# Patient Record
Sex: Female | Born: 1960 | Race: White | Hispanic: No | Marital: Married | Smoking: Never smoker
Health system: Southern US, Community
[De-identification: ages and names within clinical notes are randomized; demographics above are authoritative.]

## PROBLEM LIST (undated history)

## (undated) DIAGNOSIS — E079 Disorder of thyroid, unspecified: Secondary | ICD-10-CM

## (undated) DIAGNOSIS — Z9889 Other specified postprocedural states: Secondary | ICD-10-CM

## (undated) DIAGNOSIS — F419 Anxiety disorder, unspecified: Secondary | ICD-10-CM

## (undated) DIAGNOSIS — F32A Depression, unspecified: Secondary | ICD-10-CM

## (undated) DIAGNOSIS — M47812 Spondylosis without myelopathy or radiculopathy, cervical region: Secondary | ICD-10-CM

## (undated) DIAGNOSIS — D649 Anemia, unspecified: Secondary | ICD-10-CM

## (undated) DIAGNOSIS — F40240 Claustrophobia: Secondary | ICD-10-CM

## (undated) DIAGNOSIS — T753XXA Motion sickness, initial encounter: Secondary | ICD-10-CM

## (undated) DIAGNOSIS — M1990 Unspecified osteoarthritis, unspecified site: Secondary | ICD-10-CM

## (undated) DIAGNOSIS — F329 Major depressive disorder, single episode, unspecified: Secondary | ICD-10-CM

## (undated) DIAGNOSIS — E039 Hypothyroidism, unspecified: Secondary | ICD-10-CM

## (undated) DIAGNOSIS — M461 Sacroiliitis, not elsewhere classified: Secondary | ICD-10-CM

## (undated) DIAGNOSIS — M719 Bursopathy, unspecified: Secondary | ICD-10-CM

## (undated) DIAGNOSIS — R112 Nausea with vomiting, unspecified: Secondary | ICD-10-CM

## (undated) DIAGNOSIS — I10 Essential (primary) hypertension: Secondary | ICD-10-CM

## (undated) DIAGNOSIS — R569 Unspecified convulsions: Secondary | ICD-10-CM

## (undated) DIAGNOSIS — Z973 Presence of spectacles and contact lenses: Secondary | ICD-10-CM

## (undated) DIAGNOSIS — E162 Hypoglycemia, unspecified: Secondary | ICD-10-CM

## (undated) DIAGNOSIS — M7060 Trochanteric bursitis, unspecified hip: Secondary | ICD-10-CM

## (undated) DIAGNOSIS — Z87442 Personal history of urinary calculi: Secondary | ICD-10-CM

## (undated) DIAGNOSIS — T819XXA Unspecified complication of procedure, initial encounter: Secondary | ICD-10-CM

## (undated) DIAGNOSIS — C801 Malignant (primary) neoplasm, unspecified: Secondary | ICD-10-CM

## (undated) DIAGNOSIS — N200 Calculus of kidney: Secondary | ICD-10-CM

## (undated) HISTORY — DX: Unspecified complication of procedure, initial encounter: T81.9XXA

## (undated) HISTORY — PX: APPENDECTOMY: SHX54

## (undated) HISTORY — DX: Anxiety disorder, unspecified: F41.9

## (undated) HISTORY — DX: Depression, unspecified: F32.A

## (undated) HISTORY — PX: CYSTOSCOPY: SUR368

## (undated) HISTORY — DX: Essential (primary) hypertension: I10

## (undated) HISTORY — DX: Sacroiliitis, not elsewhere classified: M46.1

## (undated) HISTORY — DX: Spondylosis without myelopathy or radiculopathy, cervical region: M47.812

## (undated) HISTORY — DX: Disorder of thyroid, unspecified: E07.9

## (undated) HISTORY — PX: ABDOMINAL HYSTERECTOMY: SHX81

## (undated) HISTORY — DX: Major depressive disorder, single episode, unspecified: F32.9

## (undated) HISTORY — PX: GALLBLADDER SURGERY: SHX652

## (undated) HISTORY — DX: Trochanteric bursitis, unspecified hip: M70.60

---

## 1983-10-18 DIAGNOSIS — R569 Unspecified convulsions: Secondary | ICD-10-CM

## 1983-10-18 HISTORY — DX: Unspecified convulsions: R56.9

## 1997-10-17 HISTORY — PX: TOTAL VAGINAL HYSTERECTOMY: SHX2548

## 2005-10-17 HISTORY — PX: BACK SURGERY: SHX140

## 2006-10-17 HISTORY — PX: GASTRIC BYPASS: SHX52

## 2007-10-18 HISTORY — PX: GALLBLADDER SURGERY: SHX652

## 2008-10-17 HISTORY — PX: APPENDECTOMY: SHX54

## 2013-01-28 ENCOUNTER — Emergency Department: Payer: Self-pay | Admitting: Emergency Medicine

## 2013-01-28 LAB — URINALYSIS, COMPLETE
Bilirubin,UR: NEGATIVE
Glucose,UR: NEGATIVE mg/dL (ref 0–75)
Leukocyte Esterase: NEGATIVE
Nitrite: NEGATIVE
Ph: 5 (ref 4.5–8.0)
Protein: 100
RBC,UR: 1629 /HPF (ref 0–5)
Specific Gravity: 1.026 (ref 1.003–1.030)
Squamous Epithelial: NONE SEEN
WBC UR: 4 /HPF (ref 0–5)

## 2013-01-28 LAB — CBC
HCT: 38.4 % (ref 35.0–47.0)
HGB: 12.1 g/dL (ref 12.0–16.0)
MCH: 26.4 pg (ref 26.0–34.0)
MCHC: 31.4 g/dL — ABNORMAL LOW (ref 32.0–36.0)
MCV: 84 fL (ref 80–100)
Platelet: 286 10*3/uL (ref 150–440)
RBC: 4.57 10*6/uL (ref 3.80–5.20)
RDW: 16.4 % — ABNORMAL HIGH (ref 11.5–14.5)
WBC: 5.9 10*3/uL (ref 3.6–11.0)

## 2013-01-28 LAB — COMPREHENSIVE METABOLIC PANEL
Albumin: 4 g/dL (ref 3.4–5.0)
Alkaline Phosphatase: 142 U/L — ABNORMAL HIGH (ref 50–136)
Anion Gap: 3 — ABNORMAL LOW (ref 7–16)
BUN: 13 mg/dL (ref 7–18)
Bilirubin,Total: 0.3 mg/dL (ref 0.2–1.0)
Calcium, Total: 8.8 mg/dL (ref 8.5–10.1)
Chloride: 107 mmol/L (ref 98–107)
Co2: 28 mmol/L (ref 21–32)
Creatinine: 0.71 mg/dL (ref 0.60–1.30)
EGFR (African American): >60
EGFR (Non-African Amer.): >60
Glucose: 82 mg/dL (ref 65–99)
Osmolality: 275 (ref 275–301)
Potassium: 3.6 mmol/L (ref 3.5–5.1)
SGOT(AST): 26 U/L (ref 15–37)
SGPT (ALT): 39 U/L (ref 12–78)
Sodium: 138 mmol/L (ref 136–145)
Total Protein: 7.3 g/dL (ref 6.4–8.2)

## 2013-02-13 DIAGNOSIS — N200 Calculus of kidney: Secondary | ICD-10-CM | POA: Insufficient documentation

## 2013-08-15 DIAGNOSIS — R8290 Unspecified abnormal findings in urine: Secondary | ICD-10-CM | POA: Insufficient documentation

## 2013-08-15 DIAGNOSIS — R82992 Hyperoxaluria: Secondary | ICD-10-CM | POA: Insufficient documentation

## 2013-09-08 ENCOUNTER — Ambulatory Visit: Payer: Self-pay | Admitting: Emergency Medicine

## 2014-03-17 DIAGNOSIS — M47812 Spondylosis without myelopathy or radiculopathy, cervical region: Secondary | ICD-10-CM

## 2014-03-17 DIAGNOSIS — M5030 Other cervical disc degeneration, unspecified cervical region: Secondary | ICD-10-CM | POA: Insufficient documentation

## 2014-03-17 HISTORY — DX: Spondylosis without myelopathy or radiculopathy, cervical region: M47.812

## 2014-05-28 DIAGNOSIS — M47816 Spondylosis without myelopathy or radiculopathy, lumbar region: Secondary | ICD-10-CM | POA: Insufficient documentation

## 2014-08-22 DIAGNOSIS — M461 Sacroiliitis, not elsewhere classified: Secondary | ICD-10-CM

## 2014-08-22 HISTORY — DX: Sacroiliitis, not elsewhere classified: M46.1

## 2014-10-01 DIAGNOSIS — M7060 Trochanteric bursitis, unspecified hip: Secondary | ICD-10-CM

## 2014-10-01 HISTORY — DX: Trochanteric bursitis, unspecified hip: M70.60

## 2015-02-20 DIAGNOSIS — T819XXA Unspecified complication of procedure, initial encounter: Secondary | ICD-10-CM

## 2015-02-20 DIAGNOSIS — K2960 Other gastritis without bleeding: Secondary | ICD-10-CM | POA: Insufficient documentation

## 2015-02-20 HISTORY — DX: Unspecified complication of procedure, initial encounter: T81.9XXA

## 2015-03-26 ENCOUNTER — Encounter: Payer: Self-pay | Admitting: Psychiatry

## 2015-03-26 ENCOUNTER — Other Ambulatory Visit: Payer: Self-pay

## 2015-03-26 ENCOUNTER — Ambulatory Visit (INDEPENDENT_AMBULATORY_CARE_PROVIDER_SITE_OTHER): Payer: Federal, State, Local not specified - PPO | Admitting: Psychiatry

## 2015-03-26 VITALS — BP 124/86 | HR 66 | Temp 97.8°F | Ht 69.0 in

## 2015-03-26 DIAGNOSIS — F331 Major depressive disorder, recurrent, moderate: Secondary | ICD-10-CM | POA: Diagnosis not present

## 2015-03-26 DIAGNOSIS — F509 Eating disorder, unspecified: Secondary | ICD-10-CM | POA: Diagnosis not present

## 2015-03-26 MED ORDER — BUPROPION HCL ER (SR) 100 MG PO TB12: 100.0000 mg | ORAL_TABLET | Freq: Every day | ORAL | Status: DC

## 2015-03-26 MED ORDER — CLONAZEPAM 0.5 MG PO TABS: 0.5000 mg | ORAL_TABLET | Freq: Every day | ORAL | Status: DC

## 2015-03-26 MED ORDER — LAMOTRIGINE 25 MG PO TABS: ORAL_TABLET | ORAL | Status: DC

## 2015-03-26 NOTE — Progress Notes (Signed)
BH MD/PA/NP OP Progress Note  03/26/2015 11:19 AM Toni Green  MRN:  [id number]  Subjective:   Pt is a 54 yo female presented for follow up accompanied by her husband. She stated that she has started feeling somewhat better after her medications were adjusted at her last visit. She reported that she continues to feel depressed and anxious and is going to have distilled bypass surgery of her gastric mucosa as she continues to have problems with her bile. She has history of gastric bypass in the past. She reported that she is very much focused on losing 50 pounds as she has gained 50 pounds after her gastric bypass surgery. She reported that she drinks a lot of coffee throughout the day. Her husband reported that she consumes approximately 10 cups of coffee per day. Patient appeared tearful during the interview. She stated that she does not eat at all during the day as she does not feel hungry. Her husband reported that she feels anxious and is focused on losing weight as a well. Patient reported that her depressive symptoms are improving and she has taken Klonopin 2-3 times on certain days but is usually taking the medication as prescribed. She has noticed improvement in her symptoms with the help of the medication changes. She sleeps well with the help of the Ambien. She currently denied having any suicidal ideations or plans. Chief Complaint:  Chief Complaint    Anxiety; Follow-up     Visit Diagnosis:  No diagnosis found.  Past Medical History:  Past Medical History  Diagnosis Date  . Hypertension   . Thyroid disease   . Anxiety   . Depression     Past Surgical History  Procedure Laterality Date  . Abdominal hysterectomy    . Gallbladder surgery    . Appendectomy    . Gastric bypass     Family History:  Family History  Problem Relation Age of Onset  . Depression Mother   . Hypertension Mother   . Depression Father   . Hypertension Father   . Depression Sister   . Hypertension  Maternal Grandfather   . Depression Maternal Grandmother   . Hypertension Maternal Grandmother   . Hypertension Paternal Grandfather   . Hypertension Paternal Grandmother    Social History:  History   Social History  . Marital Status: Married    Spouse Name: N/A  . Number of Children: N/A  . Years of Education: N/A   Social History Main Topics  . Smoking status: Never Smoker   . Smokeless tobacco: Never Used  . Alcohol Use: No  . Drug Use: No  . Sexual Activity: No   Other Topics Concern  . None   Social History Narrative  . None   Additional History:  Husband is very supportive. 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 Assessment:   Musculoskeletal: Strength & Muscle Tone: within normal limits Gait & Station: normal Patient leans: N/A  Psychiatric Specialty Exam: HPI  Review of Systems  Constitutional: Positive for malaise/fatigue. Negative for weight loss.  HENT: Negative for nosebleeds and tinnitus.   Eyes: Negative for pain.  Respiratory: Negative for sputum production and stridor.   Cardiovascular: Negative for orthopnea.  Gastrointestinal: Negative for diarrhea.  Musculoskeletal: Positive for back pain. Negative for neck pain.  Skin: Negative for itching.  Neurological: Negative for speech change.  Endo/Heme/Allergies: Negative for environmental allergies.  Psychiatric/Behavioral: Positive for depression. Negative for substance  abuse. The patient is nervous/anxious. The patient does not have insomnia.     Blood pressure 124/86, pulse 66, temperature 97.8 F (36.6 C), temperature source Tympanic, height 5\' 9"  (1.753 m), SpO2 95 %.There is no weight on file to calculate BMI.  General Appearance: Fairly Groomed  Eye Contact:  Fair  Speech:  Slow  Volume:  Decreased  Mood:  Depressed  Affect:  Depressed  Thought Process:  Coherent  Orientation:  Full (Time, Place, and Person)  Thought Content:  WDL  Suicidal  Thoughts:  No  Homicidal Thoughts:  No  Memory:  Negative Immediate;   Fair  Judgement:  Fair  Insight:  Fair  Psychomotor Activity:  Normal  Concentration:  Fair  Recall:  [employer] of Knowledge: Fair  Language: Fair  Akathisia:  No  Handed:  Right  AIMS (if indicated):  none  Assets:  Communication Skills Desire for Improvement Physical Health  ADL's:  Intact  Cognition: WNL  Sleep:  6-7    Is the patient at risk to self?  No. Has the patient been a risk to self in the past 6 months?  No. Has the patient been a risk to self within the distant past?  No. Is the patient a risk to others?  No. Has the patient been a risk to others in the past 6 months?  No. Has the patient been a risk to others within the distant past?  No.  Current Medications: Current Outpatient Prescriptions  Medication Sig Dispense Refill  . buPROPion (WELLBUTRIN XL) 150 MG 24 hr tablet     . clonazePAM (KLONOPIN) 0.5 MG tablet     . furosemide (LASIX) 20 MG tablet     . HYDROcodone-acetaminophen (NORCO/VICODIN) 5-325 MG per tablet     . lamoTRIgine (LAMICTAL) 25 MG tablet     . oxyCODONE-acetaminophen (ROXICET) 5-325 MG per tablet 1-2 tabs q 4-6 hrs prn pain    . prazosin (MINIPRESS) 5 MG capsule Take 5 mg by mouth.    . SYNTHROID 50 MCG tablet     . tamsulosin (FLOMAX) 0.4 MG CAPS capsule Take 0.4 mg by mouth.    . zolpidem (AMBIEN CR) 12.5 MG CR tablet     . ARIPiprazole (ABILIFY) 10 MG tablet     . cyclobenzaprine (FLEXERIL) 10 MG tablet     . ondansetron (ZOFRAN-ODT) 4 MG disintegrating tablet     . PARoxetine (PAXIL) 30 MG tablet     . Potassium Citrate 15 MEQ (1620 MG) TBCR Take by mouth.    Marland Kitchen VIIBRYD 40 MG TABS      No current facility-administered medications for this visit.    Medical Decision Making:  Established Problem, Stable/Improving (1)  Treatment Plan Summary:Medication management   Discussed with patient and her husband at length about the medications treatment risk  benefits and alternatives I will start her on lamotrigine 50 mg by mouth daily I will also continue her on Klonopin when necessary and she has enough supply of the medication She will continue on Wellbutrin and I will decrease the dose 100 mg in the morning Advised her to decrease her use of caffeine as she is currently drinking approximately 10 cups per day and she agreed with the plan She will follow up in 1 month  Plan:    SSRI/ SNRI/ Antidepressants Discussed with pt about the [facility] Box warnings, increased risk of suicidal thinking when starting the medications.  GI side effects, sexual side effects, increase in manic  or hypomanic symptoms as well as the discontinuation syndromes.Advised about withdrawal symptoms if stopped immediately. Pt demonstrated understanding.      LAMOTRIGINE Black Box warning of Toxic Epidermal Necrolysis and Kathreen Cosier Syndrome.  The incidence is severe if the medication dose is titrated quickly, co administered with Valproate or exceeding the initial recommended dose.  Pt demonstrated understanding and agreed with the plan.     BENZODIAZEPINES: Benzodiazepine medications may be habit forming.  If you feel the medication is not working as well, do not take more than the prescribed dose.  Taking too much of a benzodiazepine medication may cause respiratory depression and death.  Always store medication safely and away from children.  Benzodiazepine medications can cause drowsiness.  Avoid driving, operating machinery, or doing anything dangerous if you are not alert.Do not drink alcohol when taking benzodiazepine medications.  This will increase the sedative effect, and could lead to alcohol toxicity and death.       More than 50% of the time spent in psychoeducation, counseling and coordination of care.    This note was generated in part or whole with voice recognition software. Voice regonition is usually quite accurate but there are transcription  errors that can and very often do occur. I apologize for any typographical errors that were not detected and corrected.   Rainey Pines 03/26/2015, 11:19 AM

## 2015-04-23 ENCOUNTER — Other Ambulatory Visit: Payer: Self-pay

## 2015-04-23 ENCOUNTER — Encounter: Payer: Self-pay | Admitting: Psychiatry

## 2015-04-23 ENCOUNTER — Ambulatory Visit (INDEPENDENT_AMBULATORY_CARE_PROVIDER_SITE_OTHER): Payer: Federal, State, Local not specified - PPO | Admitting: Psychiatry

## 2015-04-23 ENCOUNTER — Ambulatory Visit: Payer: Federal, State, Local not specified - PPO | Admitting: Psychiatry

## 2015-04-23 VITALS — BP 122/88 | HR 82 | Temp 97.7°F | Ht 70.0 in | Wt 187.8 lb

## 2015-04-23 DIAGNOSIS — F331 Major depressive disorder, recurrent, moderate: Secondary | ICD-10-CM | POA: Insufficient documentation

## 2015-04-23 DIAGNOSIS — F603 Borderline personality disorder: Secondary | ICD-10-CM | POA: Insufficient documentation

## 2015-04-23 DIAGNOSIS — Z8679 Personal history of other diseases of the circulatory system: Secondary | ICD-10-CM | POA: Insufficient documentation

## 2015-04-23 MED ORDER — ZOLPIDEM TARTRATE 10 MG PO TABS: 10.0000 mg | ORAL_TABLET | Freq: Every evening | ORAL | Status: DC | PRN

## 2015-04-23 MED ORDER — DESVENLAFAXINE SUCCINATE ER 50 MG PO TB24: 50.0000 mg | ORAL_TABLET | Freq: Every day | ORAL | Status: DC

## 2015-04-23 MED ORDER — LAMOTRIGINE 100 MG PO TABS: 100.0000 mg | ORAL_TABLET | Freq: Every day | ORAL | Status: DC

## 2015-04-23 MED ORDER — CLONAZEPAM 0.5 MG PO TABS: 0.5000 mg | ORAL_TABLET | Freq: Every day | ORAL | Status: DC

## 2015-04-23 NOTE — Progress Notes (Signed)
BH MD/PA/NP OP Progress Note  04/23/2015 3:26 PM Toni Green  MRN:  [id number]  Subjective:    Patient is a 54 year old female who presented for follow-up accompanied by her husband. She reported that she has been compliant with her medications but she has been crying a lot. She has been taking lamotrigine 50 mg at bedtime. She has noticed improvement in her memory. She reported that she has started exercising and is also following with the nutritionist as she is trying to lose weight. Patient stated that she feels that her mood symptoms are getting better. Patient is interested in titrating the dose of lamotrigine at this time. She takes Klonopin on a when necessary basis. Patient stated that Wellbutrin does not help her and she wants her medications to be adjusted. Her husband remains supportive. She denied having any suicidal homicidal ideations or plans.  Chief Complaint:  Chief Complaint    Follow-up; Depression     Visit Diagnosis:     ICD-9-CM ICD-10-CM   1. MDD (major depressive disorder), recurrent episode, moderate 296.32 F33.1     Past Medical History:  Past Medical History  Diagnosis Date  . Hypertension   . Thyroid disease   . Anxiety   . Depression     Past Surgical History  Procedure Laterality Date  . Abdominal hysterectomy    . Gallbladder surgery    . Appendectomy    . Gastric bypass     Family History:  Family History  Problem Relation Age of Onset  . Depression Mother   . Hypertension Mother   . Depression Father   . Hypertension Father   . Depression Sister   . Hypertension Maternal Grandfather   . Depression Maternal Grandmother   . Hypertension Maternal Grandmother   . Hypertension Paternal Grandfather   . Hypertension Paternal Grandmother    Social History:  History   Social History  . Marital Status: Married    Spouse Name: N/A  . Number of Children: N/A  . Years of Education: N/A   Social History Main Topics  . Smoking status:  Never Smoker   . Smokeless tobacco: Never Used  . Alcohol Use: No  . Drug Use: No  . Sexual Activity: No   Other Topics Concern  . Not on file   Social History Narrative  . No narrative on file   Additional History:   Assessment:   Musculoskeletal: Strength & Muscle Tone: within normal limits Gait & Station: normal Patient leans: N/A  Psychiatric Specialty Exam: HPI  ROS  There were no vitals taken for this visit.There is no weight on file to calculate BMI.  General Appearance: Fairly Groomed  Eye Contact:  Fair  Speech:  Normal Rate  Volume:  Normal  Mood:  Anxious and Depressed  Affect:  Congruent and Tearful  Thought Process:  Coherent  Orientation:  Full (Time, Place, and Person)  Thought Content:  WDL  Suicidal Thoughts:  No  Homicidal Thoughts:  No  Memory:  Immediate;   Fair  Judgement:  Fair  Insight:  Fair  Psychomotor Activity:  Normal  Concentration:  Fair  Recall:  [employer] of Knowledge: Fair  Language: Fair  Akathisia:  No  Handed:  Right  AIMS (if indicated):  Assets:  Communication Skills Desire for Improvement Physical Health Social Support  ADL's:  Intact  Cognition: WNL  Sleep:     Is the patient at risk to self?  No. Has the patient been a risk  to self in the past 6 months?  No. Has the patient been a risk to self within the distant past?  No. Is the patient a risk to others?  No. Has the patient been a risk to others in the past 6 months?  No. Has the patient been a risk to others within the distant past?  No.  Current Medications: Current Outpatient Prescriptions  Medication Sig Dispense Refill  . ARIPiprazole (ABILIFY) 10 MG tablet     . buPROPion (WELLBUTRIN SR) 100 MG 12 hr tablet Take 1 tablet (100 mg total) by mouth daily. 30 tablet 1  . buPROPion (WELLBUTRIN XL) 150 MG 24 hr tablet     . clonazePAM (KLONOPIN) 0.5 MG tablet Take 1 tablet (0.5 mg total) by mouth daily. 30 tablet 1  . cyclobenzaprine (FLEXERIL) 10 MG  tablet     . furosemide (LASIX) 20 MG tablet     . HYDROcodone-acetaminophen (NORCO/VICODIN) 5-325 MG per tablet     . lamoTRIgine (LAMICTAL) 25 MG tablet     . lamoTRIgine (LAMICTAL) 25 MG tablet 2 pills qhs 60 tablet 1  . liothyronine (CYTOMEL) 5 MCG tablet Take by mouth.    . ondansetron (ZOFRAN-ODT) 4 MG disintegrating tablet     . oxyCODONE-acetaminophen (ROXICET) 5-325 MG per tablet 1-2 tabs q 4-6 hrs prn pain    . PARoxetine (PAXIL) 30 MG tablet     . Potassium Citrate 15 MEQ (1620 MG) TBCR Take by mouth.    . prazosin (MINIPRESS) 5 MG capsule Take 5 mg by mouth.    . SYNTHROID 50 MCG tablet     . tamsulosin (FLOMAX) 0.4 MG CAPS capsule Take 0.4 mg by mouth.    Marland Kitchen VIIBRYD 40 MG TABS     . zolpidem (AMBIEN CR) 12.5 MG CR tablet      No current facility-administered medications for this visit.    Medical Decision Making:  Review of Psycho-Social Stressors (1) and Review of Last Therapy Session (1)  Treatment Plan Summary:Medication management  Discussed with patient about the medications and I will adjust them as follows Start her on Pristiq 50 mg by mouth daily She will discontinue the Wellbutrin in the morning I will titrate the lamotrigine 100 mg daily She will continue on Klonopin 0.5 mg when necessary  switch her to Ambien 10 mg at bedtime Discussed with patient about her medications and she demonstrated understanding Follow-up in 1 month    More than 50% of the time spent in psychoeducation, counseling and coordination of care.    This note was generated in part or whole with voice recognition software. Voice regonition is usually quite accurate but there are transcription errors that can and very often do occur. I apologize for any typographical errors that were not detected and corrected.    Rainey Pines 04/23/2015, 3:26 PM

## 2015-05-07 ENCOUNTER — Other Ambulatory Visit: Payer: Self-pay | Admitting: Internal Medicine

## 2015-05-07 ENCOUNTER — Other Ambulatory Visit: Payer: Self-pay

## 2015-05-07 MED ORDER — ZOLPIDEM TARTRATE 10 MG PO TABS: 10.0000 mg | ORAL_TABLET | Freq: Every evening | ORAL | Status: DC | PRN

## 2015-05-12 ENCOUNTER — Other Ambulatory Visit: Payer: Self-pay

## 2015-05-19 ENCOUNTER — Encounter: Payer: Self-pay | Admitting: Psychiatry

## 2015-05-19 ENCOUNTER — Ambulatory Visit (INDEPENDENT_AMBULATORY_CARE_PROVIDER_SITE_OTHER): Payer: Federal, State, Local not specified - PPO | Admitting: Psychiatry

## 2015-05-19 VITALS — BP 122/82 | HR 68 | Temp 97.4°F | Ht 69.0 in | Wt 191.0 lb

## 2015-05-19 DIAGNOSIS — F331 Major depressive disorder, recurrent, moderate: Secondary | ICD-10-CM | POA: Diagnosis not present

## 2015-05-19 MED ORDER — CLONAZEPAM 0.5 MG PO TABS: ORAL_TABLET | ORAL | Status: DC

## 2015-05-19 MED ORDER — ZOLPIDEM TARTRATE ER 12.5 MG PO TBCR: 12.5000 mg | EXTENDED_RELEASE_TABLET | Freq: Every day | ORAL | Status: DC

## 2015-05-19 MED ORDER — LAMOTRIGINE 100 MG PO TABS: 100.0000 mg | ORAL_TABLET | Freq: Every day | ORAL | Status: DC

## 2015-05-19 MED ORDER — DESVENLAFAXINE SUCCINATE ER 100 MG PO TB24: 100.0000 mg | ORAL_TABLET | Freq: Every day | ORAL | Status: DC

## 2015-05-19 NOTE — Progress Notes (Signed)
BH MD/PA/NP OP Progress Note  05/19/2015 1:19 PM Toni Green  MRN:  [id number]  Subjective:    Patient is a 54 year old female who presented for follow-up accompanied by her husband. She stated that she has been feeling sleepy during the daytime after she takes the Klonopin in the morning. Patient reported that she takes Pristiq in the morning to help with her depression and feels that the dose is not enough. She also takes Ambien CR at night to help with her sleep. Patient reported that she wakes up tired. She stated that she has a started improving with her medication and is not crying anymore. She has been following with a nutritionist and is still focused on getting the surgery done but the nutritional feels that she can still lose weight without surgery and can do diet and exercise. Patient is reported that her medications are not working effectively for her her husband also agreed that her more symptoms aren't getting better. She currently denied having any adverse effects of the medications. She denied having any suicidal homicidal ideations or plans    Chief Complaint:  Chief Complaint    Follow-up; Medication Refill; Anxiety; Depression     Visit Diagnosis:     ICD-9-CM ICD-10-CM   1. MDD (major depressive disorder), recurrent episode, moderate 296.32 F33.1     Past Medical History:  Past Medical History  Diagnosis Date  . Hypertension   . Thyroid disease   . Anxiety   . Depression     Past Surgical History  Procedure Laterality Date  . Abdominal hysterectomy    . Gallbladder surgery    . Appendectomy    . Gastric bypass     Family History:  Family History  Problem Relation Age of Onset  . Depression Mother   . Hypertension Mother   . Depression Father   . Hypertension Father   . Depression Sister   . Hypertension Maternal Grandfather   . Depression Maternal Grandmother   . Hypertension Maternal Grandmother   . Hypertension Paternal Grandfather   .  Hypertension Paternal Grandmother    Social History:  History   Social History  . Marital Status: Married    Spouse Name: N/A  . Number of Children: N/A  . Years of Education: N/A   Social History Main Topics  . Smoking status: Never Smoker   . Smokeless tobacco: Never Used  . Alcohol Use: No  . Drug Use: No  . Sexual Activity: No   Other Topics Concern  . None   Social History Narrative   Additional History:  Lives with her husband and has been working full-time in [employer]  Assessment:   Musculoskeletal: Strength & Muscle Tone: within normal limits Gait & Station: normal Patient leans: N/A  Psychiatric Specialty Exam: Anxiety      ROS   Blood pressure 122/82, pulse 68, temperature 97.4 F (36.3 C), temperature source Tympanic, height 5\' 9"  (1.753 m), weight 191 lb (86.637 kg), SpO2 90 %.Body mass index is 28.19 kg/(m^2).  General Appearance: Fairly Groomed  Eye Contact:  Fair  Speech:  Normal Rate  Volume:  Normal  Mood:  Anxious  Affect:  Congruent and Full Range  Thought Process:  Coherent  Orientation:  Full (Time, Place, and Person)  Thought Content:  WDL  Suicidal Thoughts:  No  Homicidal Thoughts:  No  Memory:  Immediate;   Fair  Judgement:  Fair  Insight:  Fair  Psychomotor Activity:  Normal  Concentration:  Fair  Recall:  [employer] of Knowledge: Fair  Language: Fair  Akathisia:  No  Handed:  Right  AIMS (if indicated):  Assets:  Communication Skills Desire for Improvement Physical Health Social Support  ADL's:  Intact  Cognition: WNL  Sleep:     Is the patient at risk to self?  No. Has the patient been a risk to self in the past 6 months?  No. Has the patient been a risk to self within the distant past?  No. Is the patient a risk to others?  No. Has the patient been a risk to others in the past 6 months?  No. Has the patient been a risk to others within the distant past?  No.  Current Medications: Current Outpatient  Prescriptions  Medication Sig Dispense Refill  . clonazePAM (KLONOPIN) 0.5 MG tablet Take 1 tablet (0.5 mg total) by mouth daily. 30 tablet 1  . desvenlafaxine (PRISTIQ) 50 MG 24 hr tablet Take 1 tablet (50 mg total) by mouth daily. 30 tablet 3  . furosemide (LASIX) 20 MG tablet     . HYDROcodone-acetaminophen (NORCO/VICODIN) 5-325 MG per tablet     . lamoTRIgine (LAMICTAL) 100 MG tablet Take 1 tablet (100 mg total) by mouth daily. 30 tablet 1  . oxyCODONE-acetaminophen (ROXICET) 5-325 MG per tablet 1-2 tabs q 4-6 hrs prn pain    . prazosin (MINIPRESS) 5 MG capsule Take 5 mg by mouth.    . SYNTHROID 50 MCG tablet     . tamsulosin (FLOMAX) 0.4 MG CAPS capsule Take 0.4 mg by mouth.    . zolpidem (AMBIEN) 10 MG tablet Take 1 tablet (10 mg total) by mouth at bedtime as needed for sleep. 30 tablet 5  . buPROPion (WELLBUTRIN SR) 100 MG 12 hr tablet Take 1 tablet by mouth daily.    . cyclobenzaprine (FLEXERIL) 10 MG tablet      No current facility-administered medications for this visit.    Medical Decision Making:  Review of Psycho-Social Stressors (1) and Review of Last Therapy Session (1)  Treatment Plan Summary:Medication management  Discussed with patient about the medications and I will adjust them as follows Start her on Pristiq 100 mg amount daily. She will continue on lamotrigine 100 mg daily She will continue on Klonopin 0.25 mg when necessary Patient will continue on Ambien CR 12.5 mg by mouth daily Discussed with patient about her medications and she demonstrated understanding Follow-up in 2 month    More than 50% of the time spent in psychoeducation, counseling and coordination of care.    This note was generated in part or whole with voice recognition software. Voice regonition is usually quite accurate but there are transcription errors that can and very often do occur. I apologize for any typographical errors that were not detected and corrected.    Rainey Pines 05/19/2015,  1:19 PM

## 2015-05-21 ENCOUNTER — Other Ambulatory Visit: Payer: Self-pay

## 2015-05-25 ENCOUNTER — Other Ambulatory Visit: Payer: Self-pay

## 2015-06-11 ENCOUNTER — Other Ambulatory Visit: Payer: Self-pay

## 2015-06-11 MED ORDER — PRAZOSIN HCL 5 MG PO CAPS: 5.0000 mg | ORAL_CAPSULE | Freq: Two times a day (BID) | ORAL | Status: DC

## 2015-06-15 ENCOUNTER — Other Ambulatory Visit: Payer: Self-pay | Admitting: Internal Medicine

## 2015-06-15 MED ORDER — ZOLPIDEM TARTRATE ER 12.5 MG PO TBCR: 12.5000 mg | EXTENDED_RELEASE_TABLET | Freq: Every day | ORAL | Status: DC

## 2015-06-15 NOTE — Progress Notes (Signed)
This has been reorder, not discontinued.  

## 2015-06-17 ENCOUNTER — Other Ambulatory Visit: Payer: Self-pay

## 2015-06-18 ENCOUNTER — Telehealth: Payer: Self-pay

## 2015-06-18 MED ORDER — LAMOTRIGINE 150 MG PO TABS: 150.0000 mg | ORAL_TABLET | Freq: Every day | ORAL | Status: DC

## 2015-06-18 NOTE — Telephone Encounter (Signed)
pt states that she called earlier and spoke with front desk but states that she needs something she can't work like this. pt was cry. pt states that dr. Gretel Acre changed her medication and was doing ok but now she is crying and upset and she just very emotional pt states that she does not feel like she going to harm herself but something needs to be done about her medication.

## 2015-06-18 NOTE — Telephone Encounter (Signed)
pt called spoke with nicole, states she has an appt on oct 4 but she would like it if Dr. Gretel Acre can add a medication to her prestiq, she is crying daily.

## 2015-06-18 NOTE — Telephone Encounter (Signed)
Lamotrigine dose increased to 150mg  po qdaily. Medication refilled.

## 2015-06-19 ENCOUNTER — Emergency Department
Admission: EM | Admit: 2015-06-19 | Discharge: 2015-06-19 | Disposition: A | Discharge status: Home / Self Care | Payer: BLUE CROSS/BLUE SHIELD | Attending: Emergency Medicine | Admitting: Emergency Medicine

## 2015-06-19 ENCOUNTER — Telehealth (HOSPITAL_COMMUNITY): Payer: Self-pay

## 2015-06-19 ENCOUNTER — Encounter: Payer: Self-pay | Admitting: Emergency Medicine

## 2015-06-19 DIAGNOSIS — F419 Anxiety disorder, unspecified: Secondary | ICD-10-CM | POA: Diagnosis present

## 2015-06-19 DIAGNOSIS — F329 Major depressive disorder, single episode, unspecified: Secondary | ICD-10-CM | POA: Insufficient documentation

## 2015-06-19 DIAGNOSIS — Z880 Allergy status to penicillin: Secondary | ICD-10-CM | POA: Insufficient documentation

## 2015-06-19 DIAGNOSIS — I10 Essential (primary) hypertension: Secondary | ICD-10-CM | POA: Insufficient documentation

## 2015-06-19 DIAGNOSIS — F1320 Sedative, hypnotic or anxiolytic dependence, uncomplicated: Secondary | ICD-10-CM | POA: Diagnosis not present

## 2015-06-19 MED ORDER — CLONAZEPAM 0.5 MG PO TABS: ORAL_TABLET | ORAL | Status: DC

## 2015-06-19 MED ORDER — DIAZEPAM 5 MG PO TABS
10.0000 mg | ORAL_TABLET | Freq: Once | ORAL | Status: AC
  Administered 2015-06-19: 10 mg via ORAL
  Filled 2015-06-19: qty 2

## 2015-06-19 MED ORDER — CLONAZEPAM 1 MG PO TABS: 1.0000 mg | ORAL_TABLET | Freq: Three times a day (TID) | ORAL | Status: DC | PRN

## 2015-06-19 NOTE — Discharge Instructions (Signed)
Benzodiazepine Withdrawal  °Benzodiazepines are a group of drugs that are prescribed for both short-term and long-term treatment of a variety of medical conditions. For some of these conditions, such as seizures and sudden and severe muscle spasms, they are used only for a few hours or a few days. For other conditions, such as anxiety, sleep problems, or frequent muscle spasms or to help prevent seizures, they are used for an extended period, usually weeks or months. °Benzodiazepines work by changing the way your brain functions. Normally, chemicals in your brain called neurotransmitters send messages between your brain cells. The neurotransmitter that benzodiazepines affect is called gamma-aminobutyric acid (GABA). GABA sends out messages that have a calming effect on many of the functions of your brain. Benzodiazepines make these messages stronger and increase this calming effect. °Short-term use of benzodiazepines usually does not cause problems when you stop taking the drugs. However, if you take benzodiazepines for a long time, your body can adjust to the drug and require more of it to produce the same effect (drug tolerance). Eventually, you can develop physical dependence on benzodiazepines, which is when you experience negative effects if your dosage of benzodiazepines is reduced or stopped too quickly. These negative effects are called symptoms of withdrawal. °SYMPTOMS °Symptoms of withdrawal may begin anytime within the first 10 days after you stop taking the benzodiazepine. They can last from several weeks up to a few months but usually are the worst between the first 10 to 14 days.  °The actual symptoms also vary, depending on the type of benzodiazepine you take. Possible symptoms include: °· Anxiety. °· Excitability. °· Irritability. °· Depression. °· Mood swings. °· Trouble sleeping. °· Confusion. °· Uncontrollable shaking (tremors). °· Muscle weakness. °· Seizures. °DIAGNOSIS °To diagnose  benzodiazepine withdrawal, your caregiver will examine you for certain signs, such as: °· Rapid heartbeat. °· Rapid breathing. °· Tremors. °· High blood pressure. °· Fever. °· Mood changes. °Your caregiver also may ask the following questions about your use of benzodiazepines: °· What type of benzodiazepine did you take? °· How much did you take each day? °· How long did you take the drug? °· When was the last time you took the drug? °· Do you take any other drugs? °· Have you had alcohol recently? °· Have you had a seizure recently? °· Have you lost consciousness recently? °· Have you had trouble remembering recent events? °· Have you had a recent increase in anxiety, irritability, or trouble sleeping? °A drug test also may be administered. °TREATMENT °The treatment for benzodiazepine withdrawal can vary, depending on the type and severity of your symptoms, what type of benzodiazepine you have been taking, and how long you have been taking the benzodiazepine. Sometimes it is necessary for you to be treated in a hospital, especially if you are at risk of seizures.  °Often, treatment includes a prescription for a long-acting benzodiazepine, the dosage of which is reduced slowly over a long period. This period could be several weeks or months. Eventually, your dosage will be reduced to a point that you can stop taking the drug, without experiencing withdrawal symptoms. This is called tapered withdrawal. Occasionally, minor symptoms of withdrawal continue for a few days or weeks after you have completed a tapered withdrawal. °SEEK IMMEDIATE MEDICAL CARE IF: °· You have a seizure. °· You develop a craving for drugs or alcohol. °· You begin to experience symptoms of withdrawal during your tapered withdrawal. °· You become very confused. °· You lose consciousness. °· You   have trouble breathing. °· You think about hurting yourself or someone else. °Document Released: 09/22/2011 Document Revised: 12/26/2011 Document  Reviewed: 09/22/2011 °[employer]® Patient Information ©2015 [employer]. This information is not intended to replace advice given to you by your health care provider. Make sure you discuss any questions you have with your health care provider. ° °

## 2015-06-19 NOTE — ED Notes (Signed)
Patient to ED with report of accidentally taking and excessive amount of Klonopin over the last 6 weeks. She was written to take 0.5mg  4 times daily but actually took them probably 6-7 times daily. Patient is very tearful and reports that she did not do this intentionally and was probably due to stress in her life. Patient further reports that she wants to quit taking them but is worried about going through withdrawels.

## 2015-06-19 NOTE — ED Provider Notes (Signed)
[facility] Emergency Department Provider Note     Time seen: ----------------------------------------- 3:03 PM on 06/19/2015 -----------------------------------------    I have reviewed the triage vital signs and the nursing notes.   HISTORY  Chief Complaint Anxiety    HPI Toni Green is a 54 y.o. female who presents to ER after she is taking excess amount of Klonopin over the last 6 weeks. She was written to take 0.5 mg 4 times a day but actually took them 6-7 times a day. She is very tearful and states she did not do this intentionally. States was probably due to stress in her life.Patient denies suicidal or homicidal ideations, states she had tried to wean herself when she found she was running out. Patient states she does not want to be dependent on Klonopin for the rest of her life.   Past Medical History  Diagnosis Date  . Hypertension   . Thyroid disease   . Anxiety   . Depression     Patient Active Problem List   Diagnosis Date Noted  . Borderline personality disorder 04/23/2015  . H/O: HTN (hypertension) 04/23/2015  . Depression, major, recurrent, moderate 04/23/2015  . 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 Complication of surgery [id number]  . Bursitis, trochanteric 10/01/2014  . Inflammation of sacroiliac joint 08/22/2014  . 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 DDD (degenerative disc disease), cervical 03/17/2014  . 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 Hyperoxaluria 08/15/2013  . Nonspecific finding on examination of urine 08/15/2013  . Calculus of kidney 02/13/2013    Past Surgical History  Procedure Laterality Date  . Abdominal hysterectomy    . Gallbladder surgery    . Appendectomy    . Gastric bypass      Allergies Losartan and Penicillins  Social History Social History  Substance Use Topics  . Smoking status: Never Smoker   . Smokeless tobacco: Never Used  . Alcohol Use: No     Review of Systems Constitutional: Negative for fever. Eyes: Negative for visual changes. ENT: Negative for sore throat. Cardiovascular: Negative for chest pain. Respiratory: Negative for shortness of breath. Gastrointestinal: Negative for abdominal pain, vomiting and diarrhea. Genitourinary: Negative for dysuria. Musculoskeletal: Negative for back pain. Skin: Negative for rash. Neurological: Negative for headaches, focal weakness or numbness. Psychiatric: Positive for anxiety and depression  10-point ROS otherwise negative.  ____________________________________________   PHYSICAL EXAM:  VITAL SIGNS: ED Triage Vitals  Enc Vitals Group     BP 06/19/15 1243 185/113 mmHg     Pulse Rate 06/19/15 1243 72     Resp 06/19/15 1243 18     Temp 06/19/15 1243 98.4 F (36.9 C)     Temp Source 06/19/15 1243 Oral     SpO2 06/19/15 1243 98 %     Weight 06/19/15 1243 190 lb (86.183 kg)     Height 06/19/15 1243 5\' 10"  (1.778 m)     Head Cir --      Peak Flow --      Pain Score --      Pain Loc --      Pain Edu? --      Excl. in [place]? --     Constitutional: Alert and oriented. Well appearing and in no distress. Eyes: Conjunctivae are normal. PERRL. Normal extraocular movements. ENT   Head: Normocephalic and atraumatic.   Nose: No congestion/rhinnorhea.   Mouth/Throat: Mucous membranes are moist.   Neck: No stridor. Cardiovascular: Normal rate, regular rhythm.  Normal and symmetric distal pulses are present in all extremities. No murmurs, rubs, or gallops. Respiratory: Normal respiratory effort without tachypnea nor retractions. Breath sounds are clear and equal bilaterally. No wheezes/rales/rhonchi. Gastrointestinal: Soft and nontender. No distention. No abdominal bruits.  Musculoskeletal: Nontender with normal range of motion in all extremities. No joint effusions.  No lower extremity tenderness nor edema. Neurologic:  Normal speech and language. No gross focal  neurologic deficits are appreciated. Speech is normal. No gait instability. Skin:  Skin is warm, dry and intact. No rash noted. Psychiatric: Depressed mood and affect. Speech and behavior are normal. Patient exhibits appropriate insight and judgment.  ____________________________________________  ED COURSE:  Pertinent labs & imaging results that were available during my care of the patient were reviewed by me and considered in my medical decision making (see chart for details). Patient is dependent on benzodiazepines. She appears anxious right now. Patient was given oral Valium and will start back on Klonopin. ____________________________________________  FINAL ASSESSMENT AND PLAN  Benzodiazepine dependence  Plan: Patient with labs and imaging as dictated above. Patient is in no acute distress, has been taking extra Klonopin because she is having increased anxiety. I will place her back on benzodiazepine at a higher dose until she can see her psychiatrist on Thursday. Family agrees with plan.   Earleen Newport, MD   Earleen Newport, MD 06/19/15 [phone]

## 2015-06-23 NOTE — Telephone Encounter (Signed)
Called her home number and her husband reported that she is at work. I will d/c Klonopin and will not prescribe any more.

## 2015-06-25 ENCOUNTER — Encounter: Payer: Self-pay | Admitting: Psychiatry

## 2015-06-25 ENCOUNTER — Ambulatory Visit (INDEPENDENT_AMBULATORY_CARE_PROVIDER_SITE_OTHER): Payer: BLUE CROSS/BLUE SHIELD | Admitting: Psychiatry

## 2015-06-25 VITALS — BP 122/78 | HR 116 | Temp 97.5°F | Ht 70.0 in | Wt 179.4 lb

## 2015-06-25 DIAGNOSIS — F331 Major depressive disorder, recurrent, moderate: Secondary | ICD-10-CM | POA: Diagnosis not present

## 2015-06-25 DIAGNOSIS — F411 Generalized anxiety disorder: Secondary | ICD-10-CM | POA: Diagnosis not present

## 2015-06-25 MED ORDER — DESVENLAFAXINE SUCCINATE ER 100 MG PO TB24: 100.0000 mg | ORAL_TABLET | Freq: Every day | ORAL | Status: DC

## 2015-06-25 MED ORDER — CLONAZEPAM 1 MG PO TABS: ORAL_TABLET | ORAL | Status: DC

## 2015-06-25 NOTE — Progress Notes (Signed)
BH MD/PA/NP OP Progress Note  06/25/2015 9:51 AM Toni Green  MRN:  [id number]  Subjective:    Patient is a 54 year old female who presented for follow-up accompanied by her husband. Patient was in the emergency room on 06/19/2015. She presented there stating that she had been taking an excess amount of Klonopin over the past 6 weeks. Her that documentation she was taking 0.5 mg 4 times a day but was actually using that 6-7 times a day. She presents today wanting to come off of the Klonopin and her Ambien. She was given a prescription for Klonopin by the emergency room physician. That prescription was written for 1 mg 3 times a day. The patient has been reducing this by about 1 mg every 2 days. This Probation officer did not give any additional prescriptions however did recommend that she continue to decrease her dose but perhaps go down by half a milligram each week. She will follow-up with Dr. Gretel Acre on 07/21/2015 and was already aware of that appointment. She also wants to come off of her Ambien. She has been trying to use melatonin and Aleve PM. However I did indicate that the NSAIDs contained in Aleve might not be good for regular use she gets instructions from her primary care. I made her aware that the active ingredient for sleep and Aleve's Benadryl and she could use that over-the-counter.  She indicated she was running low on her Pristiq and thus I provided a prescription for that. I did not give her any additional perceptions for Klonopin as she has 1 from the emergency room and is aware she can cut those tablets in half. She also states she has a prescription from Dr. Leda Gauze that she has not filled yet.    Chief Complaint:  Chief Complaint    Follow-up     Visit Diagnosis:     ICD-9-CM ICD-10-CM   1. MDD (major depressive disorder), recurrent episode, moderate 296.32 F33.1   2. Anxiety state 300.00 F41.1     Past Medical History:  Past Medical History  Diagnosis Date  . Hypertension   .  Thyroid disease   . Anxiety   . Depression     Past Surgical History  Procedure Laterality Date  . Abdominal hysterectomy    . Gallbladder surgery    . Appendectomy    . Gastric bypass     Family History:  Family History  Problem Relation Age of Onset  . Depression Mother   . Hypertension Mother   . Depression Father   . Hypertension Father   . Depression Sister   . Hypertension Maternal Grandfather   . Depression Maternal Grandmother   . Hypertension Maternal Grandmother   . Hypertension Paternal Grandfather   . Hypertension Paternal Grandmother    Social History:  Social History   Social History  . Marital Status: Married    Spouse Name: N/A  . Number of Children: N/A  . Years of Education: N/A   Social History Main Topics  . Smoking status: Never Smoker   . Smokeless tobacco: Never Used  . Alcohol Use: No  . Drug Use: No  . Sexual Activity: No   Other Topics Concern  . None   Social History Narrative   Additional History:  Lives with her husband and has been working full-time in [employer]  Assessment:   Musculoskeletal: Strength & Muscle Tone: within normal limits Gait & Station: normal Patient leans: N/A  Psychiatric Specialty Exam: Anxiety Symptoms include insomnia and nervous/anxious  behavior. Patient reports no suicidal ideas.      Review of Systems  Psychiatric/Behavioral: Positive for substance abuse (overuse of Klonopin as discussed above). Negative for depression, suicidal ideas, hallucinations and memory loss. The patient is nervous/anxious and has insomnia.     Blood pressure 122/78, pulse 116, temperature 97.5 F (36.4 C), temperature source Tympanic, height 5\' 10"  (1.778 m), weight 179 lb 6.4 oz (81.375 kg), SpO2 96 %.Body mass index is 25.74 kg/(m^2).  General Appearance: Fairly Groomed  Eye Contact:  Fair  Speech:  Normal Rate  Volume:  Normal  Mood:  Anxious  Affect:  Congruent and Full Range  Thought Process:  Coherent   Orientation:  Full (Time, Place, and Person)  Thought Content:  WDL  Suicidal Thoughts:  No  Homicidal Thoughts:  No  Memory:  Immediate;   Fair  Judgement:  Fair  Insight:  Fair  Psychomotor Activity:  Normal  Concentration:  Fair  Recall:  [employer] of Knowledge: Fair  Language: Fair  Akathisia:  No  Handed:  Right  AIMS (if indicated):  Assets:  Communication Skills Desire for Improvement Physical Health Social Support  ADL's:  Intact  Cognition: WNL  Sleep:     Is the patient at risk to self?  No. Has the patient been a risk to self in the past 6 months?  No. Has the patient been a risk to self within the distant past?  No. Is the patient a risk to others?  No. Has the patient been a risk to others in the past 6 months?  No. Has the patient been a risk to others within the distant past?  No.  Current Medications: Current Outpatient Prescriptions  Medication Sig Dispense Refill  . clonazePAM (KLONOPIN) 1 MG tablet Take 1 tablet twice daily, on 07/07/15 take 1 tablet in the morning and 1/2 tab in afternoon for 7 days, on 07/14/15 take 1/2 tablet in the morning and 1/2 tablet in afternoon for 7 days. See psychiatrist on 10/4 for further 30 tablet 0  . desvenlafaxine (PRISTIQ) 100 MG 24 hr tablet Take 1 tablet (100 mg total) by mouth daily. 30 tablet 0  . furosemide (LASIX) 20 MG tablet     . lamoTRIgine (LAMICTAL) 150 MG tablet Take 1 tablet (150 mg total) by mouth daily. 30 tablet 1  . prazosin (MINIPRESS) 5 MG capsule Take 1 capsule (5 mg total) by mouth 2 (two) times daily. 180 capsule 3  . PRISTIQ 50 MG 24 hr tablet     . SYNTHROID 50 MCG tablet     . tamsulosin (FLOMAX) 0.4 MG CAPS capsule Take 0.4 mg by mouth.    . zolpidem (AMBIEN CR) 12.5 MG CR tablet Take 1 tablet (12.5 mg total) by mouth at bedtime. 90 tablet 1  . cyclobenzaprine (FLEXERIL) 10 MG tablet     . HYDROcodone-acetaminophen (NORCO/VICODIN) 5-325 MG per tablet     . oxyCODONE-acetaminophen (ROXICET)  5-325 MG per tablet 1-2 tabs q 4-6 hrs prn pain     No current facility-administered medications for this visit.    Medical Decision Making:  Review of Psycho-Social Stressors (1) and Review of Last Therapy Session (1)  Treatment Plan Summary:Medication management  Discussed with patient about the medications and I will adjust them as follows Start her on Pristiq 100 mg amount daily. Continue on lamotrigine 150 mg daily She is going to continue on 1 mg twice a day until September 13 in which she will go to  1 mg in the morning and a half a milligram in the afternoon. On September 27 she will go to a half a milligram in the morning and half a milligram in the afternoon and then on October 4 she can get further direction from her regular psychiatrist. He discussed any signs of withdrawal symptoms and that she should call or report to the emergency room should this occur.  This Probation officer provided no additional supply of Klonopin.  Patient will continue on Ambien CR 12.5 mg by mouth daily but has been using Alleve PM and melatonin 5 mg i  Hope to stop the Ambien CR Discussed with patient about her medications and she demonstrated understanding Follow-up in 2 month      Druanne Bosques 06/25/2015, 9:51 AM

## 2015-06-25 NOTE — Telephone Encounter (Signed)
pt was suppose to see you on 06-25-15 but you were out (sick) pt seen dr. Jimmye Norman because she took off from work to see you and you were not here.

## 2015-07-02 ENCOUNTER — Telehealth: Payer: Self-pay | Admitting: Psychiatry

## 2015-07-03 NOTE — Telephone Encounter (Signed)
pt wanted to find out if she can take medication at night or if she can switch to effexor.

## 2015-07-07 ENCOUNTER — Ambulatory Visit (INDEPENDENT_AMBULATORY_CARE_PROVIDER_SITE_OTHER): Payer: BLUE CROSS/BLUE SHIELD | Admitting: Psychiatry

## 2015-07-07 ENCOUNTER — Encounter: Payer: Self-pay | Admitting: Psychiatry

## 2015-07-07 VITALS — BP 136/88 | HR 100 | Temp 98.5°F | Ht 69.0 in | Wt 173.6 lb

## 2015-07-07 DIAGNOSIS — F13230 Sedative, hypnotic or anxiolytic dependence with withdrawal, uncomplicated: Secondary | ICD-10-CM

## 2015-07-07 DIAGNOSIS — F331 Major depressive disorder, recurrent, moderate: Secondary | ICD-10-CM | POA: Diagnosis not present

## 2015-07-07 DIAGNOSIS — F13930 Sedative, hypnotic or anxiolytic use, unspecified with withdrawal, uncomplicated: Secondary | ICD-10-CM

## 2015-07-07 MED ORDER — LAMOTRIGINE 150 MG PO TABS: 150.0000 mg | ORAL_TABLET | Freq: Every day | ORAL | Status: DC

## 2015-07-07 MED ORDER — CLONAZEPAM 0.5 MG PO TABS: 0.5000 mg | ORAL_TABLET | Freq: Every day | ORAL | Status: DC

## 2015-07-07 NOTE — Progress Notes (Signed)
BH MD/PA/NP OP Progress Note  07/07/2015 1:45 PM Toni Green  MRN:  [id number]  Subjective:    Patient is a 54 year old female who presented for follow-up accompanied by her husband. Patient was in the emergency room on 06/19/2015. She was seen by Dr. Jimmye Norman for the follow-up appointment after her discharge from the emergency room. Patient reported that since her discharge she stopped taking the Klonopin and has not taking any pills for the past one week. She reported that she was having withdrawal symptoms including nausea and abdominal pain and mild tremors which were noted during the interview. However she is trying to adjust them out and is not sleeping and having anxiety. She reported that she wants to stop taking the Klonopin. She reported that she is also having diarrhea on a regular basis. Her husband was also concerned about the withdrawal symptoms. She reported that she feels that the prazosin is not helping her as she has noticed a full School in her stools last Thursday. She has history of gastric bypass and wants to have her medication adjusted. Patient was tearful during the interview and she reported that she does not feel connected during the psychiatric interview and she also was anxious coming for this appointment. Patient reported that she was told by Dr. Jimmye Norman at the ER that she needs to be tapered out of the Klonopin but she wants to stop taking the medication quickly.  Her husband was supportive and he wants her to taper out of the medication as well. Patient remained anxious and then she later agreed that she will gradually taper herself out of the medication      Chief Complaint:  Chief Complaint    Follow-up; Medication Problem; Panic Attack; Anxiety     Visit Diagnosis:   No diagnosis found.  Past Medical History:  Past Medical History  Diagnosis Date  . Hypertension   . Thyroid disease   . Anxiety   . Depression     Past Surgical History  Procedure  Laterality Date  . Abdominal hysterectomy    . Gallbladder surgery    . Appendectomy    . Gastric bypass     Family History:  Family History  Problem Relation Age of Onset  . Depression Mother   . Hypertension Mother   . Depression Father   . Hypertension Father   . Depression Sister   . Hypertension Maternal Grandfather   . Depression Maternal Grandmother   . Hypertension Maternal Grandmother   . Hypertension Paternal Grandfather   . Hypertension Paternal Grandmother    Social History:  Social History   Social History  . Marital Status: Married    Spouse Name: N/A  . Number of Children: N/A  . Years of Education: N/A   Social History Main Topics  . Smoking status: Never Smoker   . Smokeless tobacco: Never Used  . Alcohol Use: No  . Drug Use: No  . Sexual Activity: No   Other Topics Concern  . None   Social History Narrative   Additional History:  Lives with her husband and has been working full-time in [employer]  Assessment:   Musculoskeletal: Strength & Muscle Tone: within normal limits Gait & Station: normal Patient leans: N/A  Psychiatric Specialty Exam: Anxiety Symptoms include insomnia and nervous/anxious behavior. Patient reports no suicidal ideas.      Review of Systems  Psychiatric/Behavioral: Positive for substance abuse (overuse of Klonopin as discussed above). Negative for depression, suicidal ideas, hallucinations and memory  loss. The patient is nervous/anxious and has insomnia.     Blood pressure 136/88, pulse 100, temperature 98.5 F (36.9 C), temperature source Tympanic, height 5\' 9"  (1.753 m), weight 173 lb 9.6 oz (78.744 kg), SpO2 96 %.Body mass index is 25.62 kg/(m^2).  General Appearance: Fairly Groomed  Eye Contact:  Fair  Speech:  Normal Rate  Volume:  Normal  Mood:  Anxious  Affect:  Congruent and Full Range  Thought Process:  Coherent  Orientation:  Full (Time, Place, and Person)  Thought Content:  WDL  Suicidal Thoughts:  No   Homicidal Thoughts:  No  Memory:  Immediate;   Fair  Judgement:  Fair  Insight:  Fair  Psychomotor Activity:  Normal  Concentration:  Fair  Recall:  [employer] of Knowledge: Fair  Language: Fair  Akathisia:  No  Handed:  Right  AIMS (if indicated):  Assets:  Communication Skills Desire for Improvement Physical Health Social Support  ADL's:  Intact  Cognition: WNL  Sleep:     Is the patient at risk to self?  No. Has the patient been a risk to self in the past 6 months?  No. Has the patient been a risk to self within the distant past?  No. Is the patient a risk to others?  No. Has the patient been a risk to others in the past 6 months?  No. Has the patient been a risk to others within the distant past?  No.  Current Medications: Current Outpatient Prescriptions  Medication Sig Dispense Refill  . cyclobenzaprine (FLEXERIL) 10 MG tablet     . desvenlafaxine (PRISTIQ) 100 MG 24 hr tablet Take 1 tablet (100 mg total) by mouth daily. 30 tablet 0  . furosemide (LASIX) 20 MG tablet     . HYDROcodone-acetaminophen (NORCO/VICODIN) 5-325 MG per tablet     . lamoTRIgine (LAMICTAL) 150 MG tablet Take 1 tablet (150 mg total) by mouth daily. 30 tablet 1  . oxyCODONE-acetaminophen (ROXICET) 5-325 MG per tablet 1-2 tabs q 4-6 hrs prn pain    . prazosin (MINIPRESS) 5 MG capsule Take 1 capsule (5 mg total) by mouth 2 (two) times daily. 180 capsule 3  . PRISTIQ 50 MG 24 hr tablet     . SYNTHROID 50 MCG tablet     . tamsulosin (FLOMAX) 0.4 MG CAPS capsule Take 0.4 mg by mouth.    . zolpidem (AMBIEN CR) 12.5 MG CR tablet Take 1 tablet (12.5 mg total) by mouth at bedtime. 90 tablet 1  . clonazePAM (KLONOPIN) 0.5 MG tablet Take 1 tablet (0.5 mg total) by mouth daily. 7 tablet 0   No current facility-administered medications for this visit.    Medical Decision Making:  Review of Psycho-Social Stressors (1) and Review of Last Therapy Session (1)  Treatment Plan Summary:Medication management    Benzodiazepine withdrawals Advised patient to start taking the Klonopin 0.5 mg at bedtime for the next 14 days and will then decrease to Klonopin 0.25 mg at bedtime for the next 14 days. She was given written instructions about the same and she demonstrated understanding.  Depression She will continue on Pristiq 100 mg daily  Mood symptoms Patient will continue on lamotrigine 150 mg daily  Follow-up Patient will follow-up in 2 weeks as she wants to be seen early   More than 50% of the time spent in psychoeducation, counseling and coordination of care.  time spent with the patient 25 minutes   This note was generated in part or  whole with voice recognition software. Voice regonition is usually quite accurate but there are transcription errors that can and very often do occur. I apologize for any typographical errors that were not detected and corrected.        Rainey Pines 07/07/2015, 1:45 PM

## 2015-07-20 ENCOUNTER — Encounter: Payer: Self-pay | Admitting: Internal Medicine

## 2015-07-20 DIAGNOSIS — F5105 Insomnia due to other mental disorder: Secondary | ICD-10-CM | POA: Insufficient documentation

## 2015-07-20 DIAGNOSIS — I10 Essential (primary) hypertension: Secondary | ICD-10-CM | POA: Insufficient documentation

## 2015-07-20 DIAGNOSIS — F332 Major depressive disorder, recurrent severe without psychotic features: Secondary | ICD-10-CM | POA: Insufficient documentation

## 2015-07-20 DIAGNOSIS — F4001 Agoraphobia with panic disorder: Secondary | ICD-10-CM | POA: Insufficient documentation

## 2015-07-21 ENCOUNTER — Ambulatory Visit (INDEPENDENT_AMBULATORY_CARE_PROVIDER_SITE_OTHER): Payer: BLUE CROSS/BLUE SHIELD | Admitting: Internal Medicine

## 2015-07-21 ENCOUNTER — Ambulatory Visit (INDEPENDENT_AMBULATORY_CARE_PROVIDER_SITE_OTHER): Payer: BLUE CROSS/BLUE SHIELD | Admitting: Psychiatry

## 2015-07-21 ENCOUNTER — Encounter: Payer: Self-pay | Admitting: Psychiatry

## 2015-07-21 ENCOUNTER — Encounter: Payer: Self-pay | Admitting: Internal Medicine

## 2015-07-21 VITALS — BP 130/80 | HR 92 | Temp 99.0°F | Ht 69.0 in | Wt 175.0 lb

## 2015-07-21 VITALS — BP 124/82 | HR 92 | Temp 98.1°F | Ht 69.0 in | Wt 175.2 lb

## 2015-07-21 DIAGNOSIS — J0100 Acute maxillary sinusitis, unspecified: Secondary | ICD-10-CM

## 2015-07-21 DIAGNOSIS — F331 Major depressive disorder, recurrent, moderate: Secondary | ICD-10-CM

## 2015-07-21 MED ORDER — AZITHROMYCIN 250 MG PO TABS: ORAL_TABLET | ORAL | Status: DC

## 2015-07-21 MED ORDER — DESVENLAFAXINE SUCCINATE ER 100 MG PO TB24: 100.0000 mg | ORAL_TABLET | Freq: Every day | ORAL | Status: DC

## 2015-07-21 MED ORDER — LAMOTRIGINE 150 MG PO TABS: 150.0000 mg | ORAL_TABLET | Freq: Every day | ORAL | Status: DC

## 2015-07-21 NOTE — Progress Notes (Signed)
BH MD/PA/NP OP Progress Note  07/21/2015 10:37 AM Toni Green  MRN:  [id number]  Subjective:    Patient is a 54 year old female who presented for follow-up accompanied by her husband. Patient reported that she is doing much better since her last appointment as she has started tapering herself down on the Klonopin. She is currently taking Klonopin 0.25 mg at bedtime. She reported that she is not having any withdrawal symptoms. She was discussing about the dose of the Klonopin on a daily basis. She is now advised to take Klonopin on alternate days so she does not have any withdrawal symptoms. She agreed with the plan. Her husband also reported that she is more calm and alert and is not experiencing any withdrawal symptoms. Patient reported that she takes Ambien to help her sleep at night which is prescribed by her primary care physician. She reported that the combination of Pristiq and lamotrigine is helping with her mood and depressive symptoms. And she denied having any adverse effects of the medication.  Patient appeared very alert and oriented during the interview and she was very receptive to the medication changes.   Chief Complaint:  Chief Complaint    Follow-up; Medication Refill     Visit Diagnosis:     ICD-9-CM ICD-10-CM   1. MDD (major depressive disorder), recurrent episode, moderate ([place]) 296.32 F33.1     Past Medical History:  Past Medical History  Diagnosis Date  . Hypertension   . Thyroid disease   . Anxiety   . Depression     Past Surgical History  Procedure Laterality Date  . Abdominal hysterectomy    . Gallbladder surgery    . Appendectomy    . Gastric bypass  2008   Family History:  Family History  Problem Relation Age of Onset  . Depression Mother   . Hypertension Mother   . Depression Father   . Hypertension Father   . Depression Sister   . Hypertension Maternal Grandfather   . Depression Maternal Grandmother   . Hypertension Maternal Grandmother    . Hypertension Paternal Grandfather   . Hypertension Paternal Grandmother    Social History:  Social History   Social History  . Marital Status: Married    Spouse Name: N/A  . Number of Children: N/A  . Years of Education: N/A   Social History Main Topics  . Smoking status: Never Smoker   . Smokeless tobacco: Never Used  . Alcohol Use: No  . Drug Use: No  . Sexual Activity: No   Other Topics Concern  . None   Social History Narrative   Additional History:  Lives with her husband and has been working full-time in [employer]  Assessment:   Musculoskeletal: Strength & Muscle Tone: within normal limits Gait & Station: normal Patient leans: N/A  Psychiatric Specialty Exam: Anxiety Symptoms include insomnia and nervous/anxious behavior. Patient reports no suicidal ideas.      Review of Systems  Psychiatric/Behavioral: Positive for substance abuse (overuse of Klonopin as discussed above). Negative for depression, suicidal ideas, hallucinations and memory loss. The patient is nervous/anxious and has insomnia.     Blood pressure 124/82, pulse 92, temperature 98.1 F (36.7 C), temperature source Tympanic, height 5\' 9"  (1.753 m), weight 175 lb 3.2 oz (79.47 kg), SpO2 97 %.Body mass index is 25.86 kg/(m^2).  General Appearance: Fairly Groomed  Eye Contact:  Fair  Speech:  Normal Rate  Volume:  Normal  Mood:  Euthymic  Affect:  Congruent and Full  Range  Thought Process:  Coherent  Orientation:  Full (Time, Place, and Person)  Thought Content:  WDL  Suicidal Thoughts:  No  Homicidal Thoughts:  No  Memory:  Immediate;   Fair  Judgement:  Fair  Insight:  Fair  Psychomotor Activity:  Normal  Concentration:  Fair  Recall:  [employer] of Knowledge: Fair  Language: Fair  Akathisia:  No  Handed:  Right  AIMS (if indicated):  Assets:  Communication Skills Desire for Improvement Physical Health Social Support  ADL's:  Intact  Cognition: WNL  Sleep:     Is the patient  at risk to self?  No. Has the patient been a risk to self in the past 6 months?  No. Has the patient been a risk to self within the distant past?  No. Is the patient a risk to others?  No. Has the patient been a risk to others in the past 6 months?  No. Has the patient been a risk to others within the distant past?  No.  Current Medications: Current Outpatient Prescriptions  Medication Sig Dispense Refill  . clonazePAM (KLONOPIN) 0.5 MG tablet Take 1 tablet (0.5 mg total) by mouth daily. 7 tablet 0  . cyclobenzaprine (FLEXERIL) 10 MG tablet     . desvenlafaxine (PRISTIQ) 100 MG 24 hr tablet Take 1 tablet (100 mg total) by mouth daily. 30 tablet 0  . furosemide (LASIX) 20 MG tablet     . lamoTRIgine (LAMICTAL) 150 MG tablet Take 1 tablet (150 mg total) by mouth daily. 30 tablet 1  . oxyCODONE-acetaminophen (ROXICET) 5-325 MG per tablet 1-2 tabs q 4-6 hrs prn pain    . prazosin (MINIPRESS) 5 MG capsule Take 1 capsule (5 mg total) by mouth 2 (two) times daily. 180 capsule 3  . PRISTIQ 50 MG 24 hr tablet     . SYNTHROID 50 MCG tablet     . tamsulosin (FLOMAX) 0.4 MG CAPS capsule Take 0.4 mg by mouth.    . zolpidem (AMBIEN CR) 12.5 MG CR tablet Take 1 tablet (12.5 mg total) by mouth at bedtime. 90 tablet 1  . HYDROcodone-acetaminophen (NORCO/VICODIN) 5-325 MG per tablet      No current facility-administered medications for this visit.    Medical Decision Making:  Review of Psycho-Social Stressors (1) and Review of Last Therapy Session (1)  Treatment Plan Summary:Medication management   Benzodiazepine withdrawals Advised patient to start taking the Klonopin 0.25 mg at bedtime on alternate days and she demonstrated understanding.  Depression She will continue on Pristiq 100 mg daily  Mood symptoms Patient will continue on lamotrigine 150 mg daily  Follow-up Patient will follow-up in one month   More than 50% of the time spent in psychoeducation, counseling and coordination of care.   time spent with the patient 25 minutes   This note was generated in part or whole with voice recognition software. Voice regonition is usually quite accurate but there are transcription errors that can and very often do occur. I apologize for any typographical errors that were not detected and corrected.        Rainey Pines 07/21/2015, 10:37 AM

## 2015-07-21 NOTE — Patient Instructions (Signed)
Take Zyrtec 10 mg once daily for post nasal drip Begin Flonase (fluticasone) nasal spray - 2 sprays in each nostril once a day

## 2015-07-21 NOTE — Progress Notes (Signed)
Date:  07/21/2015   Name:  Toni Green   DOB:  January 01, 1961   MRN:  [id number]   Chief Complaint: URI URI  The current episode started in the past 7 days. The problem has been gradually worsening. The maximum temperature recorded prior to her arrival was 100.4 - 100.9 F. Associated symptoms include congestion, coughing, ear pain, a plugged ear sensation, sinus pain, a sore throat and swollen glands. Pertinent negatives include no abdominal pain, chest pain, diarrhea, dysuria, nausea, rash or wheezing. She has tried decongestant and acetaminophen for the symptoms. The treatment provided mild relief.   Patient is being seen by psychiatry for depression and anxiety disorder. She recently was taking excessive doses of clonazepam. When she realized what had happened she stopped it cold [place] and then went into withdrawals. She was seen in the emergency room, started back on clonazepam and then followed up with psychiatry. She's now on a structured taper of clonazepam. In addition she is completely off of all narcotic pain medications. She does continue taking Ambien for sleep but plans to taper and discontinue that next. Her current medical regimen is Pristiq and Lamictal.  Review of Systems:  Review of Systems  Constitutional: Positive for fever. Negative for chills and fatigue.  HENT: Positive for congestion, ear pain, postnasal drip, sinus pressure and sore throat. Negative for trouble swallowing and voice change.   Respiratory: Positive for cough. Negative for chest tightness and wheezing.   Cardiovascular: Negative for chest pain and palpitations.  Gastrointestinal: Negative for nausea, abdominal pain and diarrhea.  Genitourinary: Negative for dysuria and hematuria.  Skin: Negative for rash.    Patient Active Problem List   Diagnosis Date Noted  . Essential (primary) hypertension 07/20/2015  . 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 disorder 07/20/2015  . Agoraphobia with panic disorder  07/20/2015  . Severe episode of recurrent major depressive disorder, without psychotic features ([place]) 07/20/2015  . Borderline personality disorder 04/23/2015  . H/O: HTN (hypertension) 04/23/2015  . Depression, major, recurrent, moderate ([place]) 04/23/2015  . 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 Complication of surgery 42/35/3614  . Bursitis, trochanteric 10/01/2014  . Inflammation of sacroiliac joint ([place]) 08/22/2014  . 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 DDD (degenerative disc disease), cervical 03/17/2014  . Arthropathy of cervical facet joint ([place]) 03/17/2014  . Hyperoxaluria ([place]) 08/15/2013  . Nonspecific finding on examination of urine 08/15/2013  . Calculus of kidney 02/13/2013    Prior to Admission medications   Medication Sig Start Date End Date Taking? Authorizing Provider  clonazePAM (KLONOPIN) 0.5 MG tablet Take 1 tablet (0.5 mg total) by mouth daily. 07/07/15  Yes Rainey Pines, MD  desvenlafaxine (PRISTIQ) 100 MG 24 hr tablet Take 1 tablet (100 mg total) by mouth daily. 07/21/15  Yes Rainey Pines, MD  furosemide (LASIX) 20 MG tablet  01/26/15  Yes Historical Provider, MD  lamoTRIgine (LAMICTAL) 150 MG tablet Take 1 tablet (150 mg total) by mouth daily. 07/21/15  Yes Rainey Pines, MD  prazosin (MINIPRESS) 5 MG capsule Take 1 capsule (5 mg total) by mouth 2 (two) times daily. 06/11/15  Yes Glean Hess, MD  SYNTHROID 50 MCG tablet  01/26/15  Yes Historical Provider, MD  tamsulosin (FLOMAX) 0.4 MG CAPS capsule Take 0.4 mg by mouth. 06/28/13  Yes Historical Provider, MD  zolpidem (AMBIEN CR) 12.5 MG CR tablet Take 1 tablet (12.5 mg total) by mouth at bedtime. 06/15/15  Yes Glean Hess, MD    Allergies  Allergen  Reactions  . Losartan   . Oxycodone-Acetaminophen Other (See Comments)    Other reaction(s): ITCHING  . Penicillins Rash    Past Surgical History  Procedure Laterality Date  . Abdominal hysterectomy    . Gallbladder surgery    . Appendectomy     . Gastric bypass  2008    Social History  Substance Use Topics  . Smoking status: Never Smoker   . Smokeless tobacco: Never Used  . Alcohol Use: No     Medication list has been reviewed and updated.  Physical Examination:  Physical Exam  Constitutional: She is oriented to person, place, and time. She appears well-developed and well-nourished.  HENT:  Right Ear: External ear and ear canal normal. Tympanic membrane is not erythematous and not retracted.  Left Ear: External ear and ear canal normal. Tympanic membrane is not erythematous and not retracted.  Nose: Right sinus exhibits maxillary sinus tenderness and frontal sinus tenderness. Left sinus exhibits maxillary sinus tenderness and frontal sinus tenderness.  Mouth/Throat: Uvula is midline and mucous membranes are normal. No oral lesions. Posterior oropharyngeal erythema present. No oropharyngeal exudate.  Cardiovascular: Normal rate, regular rhythm and normal heart sounds.   Pulmonary/Chest: Breath sounds normal. She has no wheezes. She has no rales.  Lymphadenopathy:    She has no cervical adenopathy.  Neurological: She is alert and oriented to person, place, and time.  Nursing note and vitals reviewed.   BP 130/80 mmHg  Pulse 92  Temp(Src) 99 F (37.2 C)  Ht 5\' 9"  (1.753 m)  Wt 175 lb (79.379 kg)  BMI 25.83 kg/m2  SpO2 96%  Assessment and Plan: 1. Acute maxillary sinusitis, recurrence not specified Continue Zyrtec 10 mg daily Begin Flonase 2 sprays each nostril once daily - azithromycin (ZITHROMAX Z-PAK) 250 MG tablet; Take 2 tabs on day #1 and then one a day for 4 more days  Dispense: 6 each; Refill: 0  2. Depression, major, recurrent, moderate ([place]) Improving under the care of psychiatry   Halina Maidens, MD [facility]  07/21/2015

## 2015-07-27 NOTE — Telephone Encounter (Signed)
pt already seen in office about this issue. pt had appt on  07-07-15 and on  07-21-15

## 2015-08-12 NOTE — Progress Notes (Signed)
Klonopin was refilled.  lamictal was a dosage change increase.

## 2015-08-12 NOTE — Progress Notes (Signed)
This was a refill

## 2015-08-20 ENCOUNTER — Encounter: Payer: Self-pay | Admitting: Psychiatry

## 2015-08-20 ENCOUNTER — Ambulatory Visit (INDEPENDENT_AMBULATORY_CARE_PROVIDER_SITE_OTHER): Payer: BLUE CROSS/BLUE SHIELD | Admitting: Psychiatry

## 2015-08-20 VITALS — BP 132/90 | HR 78 | Temp 97.5°F | Ht 69.0 in | Wt 175.6 lb

## 2015-08-20 DIAGNOSIS — F331 Major depressive disorder, recurrent, moderate: Secondary | ICD-10-CM

## 2015-08-20 MED ORDER — LAMOTRIGINE 150 MG PO TABS: 150.0000 mg | ORAL_TABLET | Freq: Every day | ORAL | Status: DC

## 2015-08-20 MED ORDER — DESVENLAFAXINE SUCCINATE ER 100 MG PO TB24: 100.0000 mg | ORAL_TABLET | Freq: Every day | ORAL | Status: DC

## 2015-08-20 NOTE — Progress Notes (Signed)
BH MD/PA/NP OP Progress Note  08/20/2015 10:46 AM Toni Green  MRN:  [id number]  Subjective:    Patient is a 54 year old married female who presented for follow-up accompanied by her husband. Patient reported that she has stopped the Klonopin over the weekend and is feeling much better. She stated that she is not having any withdrawal symptoms and has been sleeping well with the help of Ambien CR 12.5 mg at night. Patient appeared alert and oriented and reported that her symptoms are improving and her husband and the people at church have also noticed that she is more calm and is becoming more active. She reported that now she enjoys going to work. She reported that the combination of Pristiq and lamotrigine is also helping with her mood and depressive symptoms.she denied having any adverse effects of the medication.  Patient appeared very alert and oriented during the interview and she was very receptive to the medication changes. Patient stated that she wants to gradually taper off from the Ambien as well and is interested in having these changes  after the new year   Chief Complaint:  Chief Complaint    Follow-up; Medication Refill     Visit Diagnosis:     ICD-9-CM ICD-10-CM   1. MDD (major depressive disorder), recurrent episode, moderate ([place]) 296.32 F33.1     Past Medical History:  Past Medical History  Diagnosis Date  . Hypertension   . Thyroid disease   . Anxiety   . Depression     Past Surgical History  Procedure Laterality Date  . Abdominal hysterectomy    . Gallbladder surgery    . Appendectomy    . Gastric bypass  2008   Family History:  Family History  Problem Relation Age of Onset  . Depression Mother   . Hypertension Mother   . Depression Father   . Hypertension Father   . Depression Sister   . Hypertension Maternal Grandfather   . Depression Maternal Grandmother   . Hypertension Maternal Grandmother   . Hypertension Paternal Grandfather   .  Hypertension Paternal Grandmother    Social History:  Social History   Social History  . Marital Status: Married    Spouse Name: N/A  . Number of Children: N/A  . Years of Education: N/A   Social History Main Topics  . Smoking status: Never Smoker   . Smokeless tobacco: Never Used  . Alcohol Use: No  . Drug Use: No  . Sexual Activity: No   Other Topics Concern  . None   Social History Narrative   Additional History:  Lives with her husband and has been working full-time in [place]. She denied use of drugs or alcohol at this time.  Assessment:   Musculoskeletal: Strength & Muscle Tone: within normal limits Gait & Station: normal Patient leans: N/A  Psychiatric Specialty Exam: Anxiety Symptoms include insomnia and nervous/anxious behavior. Patient reports no suicidal ideas.      Review of Systems  Psychiatric/Behavioral: Positive for substance abuse (overuse of Klonopin as discussed above). Negative for depression, suicidal ideas, hallucinations and memory loss. The patient is nervous/anxious and has insomnia.     Blood pressure 132/90, pulse 78, temperature 97.5 F (36.4 C), temperature source Tympanic, height 5\' 9"  (1.753 m), weight 175 lb 9.6 oz (79.652 kg), SpO2 99 %.Body mass index is 25.92 kg/(m^2).  General Appearance: Fairly Groomed  Eye Contact:  Fair  Speech:  Normal Rate  Volume:  Normal  Mood:  Euthymic  Affect:  Congruent and Full Range  Thought Process:  Coherent  Orientation:  Full (Time, Place, and Person)  Thought Content:  WDL  Suicidal Thoughts:  No  Homicidal Thoughts:  No  Memory:  Immediate;   Fair  Judgement:  Fair  Insight:  Fair  Psychomotor Activity:  Normal  Concentration:  Fair  Recall:  [employer] of Knowledge: Fair  Language: Fair  Akathisia:  No  Handed:  Right  AIMS (if indicated):  Assets:  Communication Skills Desire for Improvement Physical Health Social Support  ADL's:  Intact  Cognition: WNL  Sleep:     Is the  patient at risk to self?  No. Has the patient been a risk to self in the past 6 months?  No. Has the patient been a risk to self within the distant past?  No. Is the patient a risk to others?  No. Has the patient been a risk to others in the past 6 months?  No. Has the patient been a risk to others within the distant past?  No.  Current Medications: Current Outpatient Prescriptions  Medication Sig Dispense Refill  . azithromycin (ZITHROMAX Z-PAK) 250 MG tablet Take 2 tabs on day #1 and then one a day for 4 more days 6 each 0  . desvenlafaxine (PRISTIQ) 100 MG 24 hr tablet Take 1 tablet (100 mg total) by mouth daily. 30 tablet 2  . furosemide (LASIX) 20 MG tablet     . lamoTRIgine (LAMICTAL) 150 MG tablet Take 1 tablet (150 mg total) by mouth daily. 30 tablet 1  . prazosin (MINIPRESS) 5 MG capsule Take 1 capsule (5 mg total) by mouth 2 (two) times daily. 180 capsule 3  . SYNTHROID 50 MCG tablet     . tamsulosin (FLOMAX) 0.4 MG CAPS capsule Take 0.4 mg by mouth.    . zolpidem (AMBIEN CR) 12.5 MG CR tablet Take 1 tablet (12.5 mg total) by mouth at bedtime. 90 tablet 1  . clonazePAM (KLONOPIN) 0.5 MG tablet Take 1 tablet (0.5 mg total) by mouth daily. (Patient not taking: Reported on 08/20/2015) 7 tablet 0   No current facility-administered medications for this visit.    Medical Decision Making:  Review of Psycho-Social Stressors (1) and Review of Last Therapy Session (1)  Treatment Plan Summary:Medication management    Depression She will continue on Pristiq 100 mg daily. Patient was given 3 month supply of the medications  Mood symptoms Patient will continue on lamotrigine 150 mg daily. She was given 3 month supply of the medication  Insomnia Patient takes Ambien CR 12.5 mg at bedtime and she has 90 day supply of the medication and she will refill it next week  Follow-up Patient will follow-up in three  months   More than 50% of the time spent in psychoeducation, counseling and  coordination of care.  time spent with the patient 25 minutes   This note was generated in part or whole with voice recognition software. Voice regonition is usually quite accurate but there are transcription errors that can and very often do occur. I apologize for any typographical errors that were not detected and corrected.        Rainey Pines 08/20/2015, 10:46 AM

## 2015-09-17 ENCOUNTER — Ambulatory Visit: Payer: Self-pay | Admitting: Psychiatry

## 2015-09-25 ENCOUNTER — Encounter: Payer: Self-pay | Admitting: Internal Medicine

## 2015-09-25 ENCOUNTER — Ambulatory Visit (INDEPENDENT_AMBULATORY_CARE_PROVIDER_SITE_OTHER): Payer: BLUE CROSS/BLUE SHIELD | Admitting: Internal Medicine

## 2015-09-25 VITALS — BP 122/80 | HR 74 | Temp 98.4°F | Ht 69.0 in | Wt 183.0 lb

## 2015-09-25 DIAGNOSIS — M6248 Contracture of muscle, other site: Secondary | ICD-10-CM | POA: Diagnosis not present

## 2015-09-25 DIAGNOSIS — M62838 Other muscle spasm: Secondary | ICD-10-CM

## 2015-09-25 MED ORDER — METHOCARBAMOL 500 MG PO TABS: 500.0000 mg | ORAL_TABLET | Freq: Three times a day (TID) | ORAL | Status: DC | PRN

## 2015-09-25 NOTE — Progress Notes (Signed)
Date:  09/25/2015   Name:  Toni Green   DOB:  [birth year]   MRN:  [id number]   Chief Complaint: Otalgia Otalgia  Associated symptoms include headaches and neck pain. Pertinent negatives include no ear discharge.  Neck Pain  This is a new problem. The current episode started more than 1 month ago. The problem occurs daily. The problem has been waxing and waning. The pain is associated with nothing. The pain is present in the occipital region. Associated symptoms include headaches. Pertinent negatives include no fever or trouble swallowing. She has tried acetaminophen and NSAIDs for the symptoms.    Review of Systems  Constitutional: Negative for fever, chills and fatigue.  HENT: Positive for ear pain and tinnitus. Negative for ear discharge, trouble swallowing and voice change.   Musculoskeletal: Positive for neck pain.  Neurological: Positive for headaches. Negative for dizziness.    Patient Active Problem List   Diagnosis Date Noted  . Essential (primary) hypertension 07/20/2015  . 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 disorder 07/20/2015  . 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 Severe episode of recurrent major depressive disorder, without psychotic features ([place]) 07/20/2015  . Borderline personality disorder 04/23/2015  . H/O: HTN (hypertension) 04/23/2015  . Depression, major, recurrent, moderate ([place]) 04/23/2015  . 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 Complication of surgery [id number]  . Bursitis, trochanteric 10/01/2014  . Inflammation of sacroiliac joint ([place]) 08/22/2014  . 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 DDD (degenerative disc disease), cervical 03/17/2014  . Arthropathy of cervical facet joint ([place]) 03/17/2014  . Hyperoxaluria ([place]) 08/15/2013  . Nonspecific finding on examination of urine 08/15/2013  . Calculus of kidney 02/13/2013    Prior to Admission medications   Medication Sig Start Date End Date Taking? Authorizing Provider   desvenlafaxine (PRISTIQ) 100 MG 24 hr tablet Take 1 tablet (100 mg total) by mouth daily. 08/20/15  Yes Rainey Pines, MD  furosemide (LASIX) 20 MG tablet  01/26/15  Yes Historical Provider, MD  lamoTRIgine (LAMICTAL) 150 MG tablet Take 1 tablet (150 mg total) by mouth daily. 08/20/15  Yes Rainey Pines, MD  prazosin (MINIPRESS) 5 MG capsule Take 1 capsule (5 mg total) by mouth 2 (two) times daily. 06/11/15  Yes Glean Hess, MD  SYNTHROID 50 MCG tablet  01/26/15  Yes Historical Provider, MD  tamsulosin (FLOMAX) 0.4 MG CAPS capsule Take 0.4 mg by mouth. 06/28/13  Yes Historical Provider, MD  zolpidem (AMBIEN CR) 12.5 MG CR tablet Take 1 tablet (12.5 mg total) by mouth at bedtime. 06/15/15  Yes Glean Hess, MD    Allergies  Allergen Reactions  . Losartan   . Oxycodone-Acetaminophen Other (See Comments)    Other reaction(s): ITCHING  . Penicillins Rash    Past Surgical History  Procedure Laterality Date  . Abdominal hysterectomy    . Gallbladder surgery    . Appendectomy    . Gastric bypass  2008    Social History  Substance Use Topics  . Smoking status: Never Smoker   . Smokeless tobacco: Never Used  . Alcohol Use: No     Medication list has been reviewed and updated.   Physical Exam  Constitutional: She is oriented to person, place, and time. She appears well-developed. No distress.  HENT:  Head: Normocephalic and atraumatic.  Right Ear: Tympanic membrane and ear canal normal.  Left Ear: Tympanic membrane and ear canal normal.  Eyes: Conjunctivae are normal. Right eye exhibits no discharge. Left eye exhibits no  discharge. No scleral icterus.  Cardiovascular: Normal rate, regular rhythm and normal heart sounds.   Pulmonary/Chest: Effort normal and breath sounds normal. No respiratory distress.  Musculoskeletal: Normal range of motion.       Cervical back: She exhibits tenderness and spasm.  Neurological: She is alert and oriented to person, place, and time. She has  normal strength.  Reflex Scores:      Bicep reflexes are 2+ on the right side and 2+ on the left side. Skin: Skin is warm and dry. No rash noted.  Psychiatric: She has a normal mood and affect. Her behavior is normal. Thought content normal.  Nursing note and vitals reviewed.   BP 122/80 mmHg  Pulse 74  Temp(Src) 98.4 F (36.9 C)  Ht 5\' 9"  (1.753 m)  Wt 183 lb (83.008 kg)  BMI 27.01 kg/m2  SpO2 99%  Assessment and Plan: 1. Neck muscle spasm Continue Tylenol; add heat 20 minutes 3 times a day or use ThermaCare wraps Avoid sleeping prone - methocarbamol (ROBAXIN) 500 MG tablet; Take 1 tablet (500 mg total) by mouth every 8 (eight) hours as needed for muscle spasms.  Dispense: 90 tablet; Refill: 0   Halina Maidens, MD [facility]  09/25/2015

## 2015-10-13 ENCOUNTER — Encounter: Payer: Self-pay | Admitting: Internal Medicine

## 2015-10-13 ENCOUNTER — Ambulatory Visit (INDEPENDENT_AMBULATORY_CARE_PROVIDER_SITE_OTHER): Payer: BLUE CROSS/BLUE SHIELD | Admitting: Internal Medicine

## 2015-10-13 VITALS — BP 160/104 | HR 66 | Temp 99.0°F | Ht 69.0 in | Wt 177.6 lb

## 2015-10-13 DIAGNOSIS — J0100 Acute maxillary sinusitis, unspecified: Secondary | ICD-10-CM | POA: Diagnosis not present

## 2015-10-13 DIAGNOSIS — H109 Unspecified conjunctivitis: Secondary | ICD-10-CM

## 2015-10-13 MED ORDER — NEOMYCIN-POLYMYXIN-DEXAMETH 3.5-10000-0.1 OP SUSP
2.0000 [drp] | Freq: Four times a day (QID) | OPHTHALMIC | Status: DC
Start: 2015-10-13 — End: 2015-10-20

## 2015-10-13 MED ORDER — LEVOFLOXACIN 500 MG PO TABS
500.0000 mg | ORAL_TABLET | Freq: Every day | ORAL | Status: DC
Start: 2015-10-13 — End: 2015-12-17

## 2015-10-13 NOTE — Progress Notes (Signed)
Pharmacy notified.

## 2015-10-13 NOTE — Progress Notes (Signed)
Date:  10/13/2015   Name:  Toni Green   DOB:  [birth year]   MRN:  [id number]   Chief Complaint: Sinusitis and Conjunctivitis Sinusitis This is a new problem. The current episode started in the past 7 days. The problem has been gradually worsening since onset. There has been no fever. The fever has been present for 3 to 4 days. She is experiencing no pain. Associated symptoms include chills, congestion, coughing, a hoarse voice, sinus pressure and a sore throat. Past treatments include oral decongestants. The treatment provided mild relief.  Conjunctivitis  The current episode started 5 to 7 days ago. The problem occurs continuously. The problem is severe. Nothing relieves the symptoms. Associated symptoms include photophobia, congestion, sore throat, cough and eye redness. Pertinent negatives include no nausea, no vomiting and no wheezing. The eye pain is moderate. Both eyes are affected.    Review of Systems  Constitutional: Positive for chills.  HENT: Positive for congestion, hoarse voice, sinus pressure and sore throat.   Eyes: Positive for photophobia and redness.  Respiratory: Positive for cough. Negative for wheezing.   Cardiovascular: Negative for chest pain.  Gastrointestinal: Negative for nausea and vomiting.    Patient Active Problem List   Diagnosis Date Noted  . Essential (primary) hypertension 07/20/2015  . 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 disorder 07/20/2015  . 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 Severe episode of recurrent major depressive disorder, without psychotic features ([place]) 07/20/2015  . Borderline personality disorder 04/23/2015  . H/O: HTN (hypertension) 04/23/2015  . Depression, major, recurrent, moderate ([place]) 04/23/2015  . 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 Complication of surgery [id number]  . Bursitis, trochanteric 10/01/2014  . Inflammation of sacroiliac joint ([place]) 08/22/2014  . Degenerative arthritis of lumbar spine  05/28/2014  . DDD (degenerative disc disease), cervical 03/17/2014  . Arthropathy of cervical facet joint ([place]) 03/17/2014  . Hyperoxaluria ([place]) 08/15/2013  . Nonspecific finding on examination of urine 08/15/2013  . Calculus of kidney 02/13/2013    Prior to Admission medications   Medication Sig Start Date End Date Taking? Authorizing Provider  desvenlafaxine (PRISTIQ) 100 MG 24 hr tablet Take 1 tablet (100 mg total) by mouth daily. 08/20/15  Yes Rainey Pines, MD  furosemide (LASIX) 20 MG tablet  01/26/15  Yes Historical Provider, MD  lamoTRIgine (LAMICTAL) 150 MG tablet Take 1 tablet (150 mg total) by mouth daily. 08/20/15  Yes Rainey Pines, MD  methocarbamol (ROBAXIN) 500 MG tablet Take 1 tablet (500 mg total) by mouth every 8 (eight) hours as needed for muscle spasms. 09/25/15  Yes Glean Hess, MD  prazosin (MINIPRESS) 5 MG capsule Take 1 capsule (5 mg total) by mouth 2 (two) times daily. 06/11/15  Yes Glean Hess, MD  SYNTHROID 50 MCG tablet  01/26/15  Yes Historical Provider, MD  tamsulosin (FLOMAX) 0.4 MG CAPS capsule Take 0.4 mg by mouth. 06/28/13  Yes Historical Provider, MD  zolpidem (AMBIEN CR) 12.5 MG CR tablet Take 1 tablet (12.5 mg total) by mouth at bedtime. 06/15/15  Yes Glean Hess, MD    Allergies  Allergen Reactions  . Losartan   . Oxycodone-Acetaminophen Other (See Comments)    Other reaction(s): ITCHING  . Penicillins Rash    Past Surgical History  Procedure Laterality Date  . Abdominal hysterectomy    . Gallbladder surgery    . Appendectomy    . Gastric bypass  2008    Social History  Substance Use Topics  . Smoking  status: Never Smoker   . Smokeless tobacco: Never Used  . Alcohol Use: No     Medication list has been reviewed and updated.   Physical Exam  Constitutional: She is oriented to person, place, and time. She appears well-developed and well-nourished.  HENT:  Right Ear: External ear and ear canal normal. Tympanic membrane is not  erythematous and not retracted.  Left Ear: External ear and ear canal normal. Tympanic membrane is not erythematous and not retracted.  Nose: Right sinus exhibits maxillary sinus tenderness. Right sinus exhibits no frontal sinus tenderness. Left sinus exhibits maxillary sinus tenderness. Left sinus exhibits no frontal sinus tenderness.  Mouth/Throat: Uvula is midline and mucous membranes are normal. No oral lesions. No oropharyngeal exudate or posterior oropharyngeal erythema.  Eyes: Right eye exhibits chemosis and discharge. Left eye exhibits chemosis and discharge. Right conjunctiva is injected. Left conjunctiva is injected. Right eye exhibits normal extraocular motion.  Cardiovascular: Normal rate, regular rhythm and normal heart sounds.   Pulmonary/Chest: Breath sounds normal. She has no wheezes. She has no rales.  Lymphadenopathy:    She has no cervical adenopathy.  Neurological: She is alert and oriented to person, place, and time.    BP 160/104 mmHg  Pulse 66  Temp(Src) 99 F (37.2 C)  Ht 5\' 9"  (1.753 m)  Wt 177 lb 9.6 oz (80.559 kg)  BMI 26.22 kg/m2  SpO2 100%  Assessment and Plan: 1. Acute maxillary sinusitis, recurrence not specified Resume Flonase NS Continue otc cough and cold medication - levofloxacin (LEVAQUIN) 500 MG tablet; Take 1 tablet (500 mg total) by mouth daily.  Dispense: 7 tablet; Refill: 0  2. Bilateral conjunctivitis Warm compresses as needed - neomycin-polymyxin b-dexamethasone (MAXITROL) 3.5-10000-0.1 SUSP; Place 2 drops into both eyes every 6 (six) hours.  Dispense: 5 mL; Refill: 0   Halina Maidens, MD [facility]  10/13/2015

## 2015-10-13 NOTE — Patient Instructions (Signed)
Resume Flonase Nasal spray.

## 2015-10-18 DIAGNOSIS — C801 Malignant (primary) neoplasm, unspecified: Secondary | ICD-10-CM

## 2015-10-18 HISTORY — DX: Malignant (primary) neoplasm, unspecified: C80.1

## 2015-10-20 ENCOUNTER — Other Ambulatory Visit: Payer: Self-pay

## 2015-10-20 DIAGNOSIS — H109 Unspecified conjunctivitis: Secondary | ICD-10-CM

## 2015-10-20 MED ORDER — NEOMYCIN-POLYMYXIN-DEXAMETH 3.5-10000-0.1 OP SUSP: 2.0000 [drp] | Freq: Four times a day (QID) | OPHTHALMIC | Status: DC

## 2015-11-03 ENCOUNTER — Telehealth: Payer: Self-pay

## 2015-11-03 ENCOUNTER — Other Ambulatory Visit: Payer: Self-pay

## 2015-11-03 DIAGNOSIS — M62838 Other muscle spasm: Secondary | ICD-10-CM

## 2015-11-03 MED ORDER — ZOLPIDEM TARTRATE ER 12.5 MG PO TBCR: 12.5000 mg | EXTENDED_RELEASE_TABLET | Freq: Every day | ORAL | Status: DC

## 2015-11-03 NOTE — Telephone Encounter (Signed)
Order placed

## 2015-11-03 NOTE — Telephone Encounter (Signed)
Patient called in requesting refill of Ambien to be sent to her mail order pharmacy. [facility]

## 2015-11-03 NOTE — Telephone Encounter (Signed)
Patient called in asking for a referral for PT for her neck to try to help with her migraines. She states that you placed her on a muscle relaxer and she states that these did not help and only made her feel "weird". She states that she would like to be referred to [facility] on [address] in [place]. Please advise. [facility]

## 2015-11-19 ENCOUNTER — Ambulatory Visit: Payer: Self-pay | Admitting: Psychiatry

## 2015-12-01 ENCOUNTER — Other Ambulatory Visit: Payer: Self-pay | Admitting: Physical Medicine and Rehabilitation

## 2015-12-01 ENCOUNTER — Ambulatory Visit (INDEPENDENT_AMBULATORY_CARE_PROVIDER_SITE_OTHER): Payer: BLUE CROSS/BLUE SHIELD | Admitting: Psychiatry

## 2015-12-01 ENCOUNTER — Encounter: Payer: Self-pay | Admitting: Psychiatry

## 2015-12-01 VITALS — BP 122/80 | HR 67 | Temp 97.5°F | Ht 69.0 in | Wt 182.6 lb

## 2015-12-01 DIAGNOSIS — F331 Major depressive disorder, recurrent, moderate: Secondary | ICD-10-CM | POA: Diagnosis not present

## 2015-12-01 DIAGNOSIS — F411 Generalized anxiety disorder: Secondary | ICD-10-CM

## 2015-12-01 DIAGNOSIS — M25552 Pain in left hip: Secondary | ICD-10-CM

## 2015-12-01 MED ORDER — LAMOTRIGINE 150 MG PO TABS: 150.0000 mg | ORAL_TABLET | Freq: Every day | ORAL | Status: DC

## 2015-12-01 MED ORDER — DESVENLAFAXINE SUCCINATE ER 100 MG PO TB24: 100.0000 mg | ORAL_TABLET | Freq: Every day | ORAL | Status: DC

## 2015-12-01 NOTE — Progress Notes (Signed)
BH MD/PA/NP OP Progress Note  12/01/2015 9:41 AM Toni Green  MRN:  [id number]  Subjective:    Patient is a 55 year old married female who presented for follow-up accompanied by her husband. Patient reported that she has been doing very well and has been compliant with her medication. She reported that she does not feel anxious or depressed. She has been taking Ambien at night which has been prescribed by her primary care physician. Her husband reported that sometime after taking the Ambien she will not try to go to bed but will start cooking and has been making waffles. He will make sure that she would go to bed. Patient stated that she has problems with going to sleep as well as maintaining sleep. However she is not ready to stop the Ambien at this time. We discussed at length about the different medication options but she reported that she will discuss with her primary care physician  as well.  Patient appeared very alert and oriented during the interview and she was very receptive to the medication changes.   Chief Complaint:   Visit Diagnosis:     ICD-9-CM ICD-10-CM   1. MDD (major depressive disorder), recurrent episode, moderate (HCC) 296.32 F33.1   2. Anxiety state 300.00 F41.1     Past Medical History:  Past Medical History  Diagnosis Date  . Hypertension   . Thyroid disease   . Anxiety   . Depression     Past Surgical History  Procedure Laterality Date  . Abdominal hysterectomy    . Gallbladder surgery    . Appendectomy    . Gastric bypass  2008   Family History:  Family History  Problem Relation Age of Onset  . Depression Mother   . Hypertension Mother   . Depression Father   . Hypertension Father   . Depression Sister   . Hypertension Maternal Grandfather   . Depression Maternal Grandmother   . Hypertension Maternal Grandmother   . Hypertension Paternal Grandfather   . Hypertension Paternal Grandmother    Social History:  Social History   Social  History  . Marital Status: Married    Spouse Name: N/A  . Number of Children: N/A  . Years of Education: N/A   Social History Main Topics  . Smoking status: Never Smoker   . Smokeless tobacco: Never Used  . Alcohol Use: No  . Drug Use: No  . Sexual Activity: No   Other Topics Concern  . Not on file   Social History Narrative   Additional History:  Lives with her husband and has been working full-time in [place]. She denied use of drugs or alcohol at this time.  Assessment:   Musculoskeletal: Strength & Muscle Tone: within normal limits Gait & Station: normal Patient leans: N/A  Psychiatric Specialty Exam: Anxiety Symptoms include insomnia and nervous/anxious behavior. Patient reports no suicidal ideas.      Review of Systems  Psychiatric/Behavioral: Positive for substance abuse (overuse of Klonopin as discussed above). Negative for depression, suicidal ideas, hallucinations and memory loss. The patient is nervous/anxious and has insomnia.     There were no vitals taken for this visit.There is no weight on file to calculate BMI.  General Appearance: Fairly Groomed  Eye Contact:  Fair  Speech:  Normal Rate  Volume:  Normal  Mood:  Euthymic  Affect:  Congruent and Full Range  Thought Process:  Coherent  Orientation:  Full (Time, Place, and Person)  Thought Content:  WDL  Suicidal  Thoughts:  No  Homicidal Thoughts:  No  Memory:  Immediate;   Fair  Judgement:  Fair  Insight:  Fair  Psychomotor Activity:  Normal  Concentration:  Fair  Recall:  [employer] of Knowledge: Fair  Language: Fair  Akathisia:  No  Handed:  Right  AIMS (if indicated):  Assets:  Communication Skills Desire for Improvement Physical Health Social Support  ADL's:  Intact  Cognition: WNL  Sleep:     Is the patient at risk to self?  No. Has the patient been a risk to self in the past 6 months?  No. Has the patient been a risk to self within the distant past?  No. Is the patient a risk  to others?  No. Has the patient been a risk to others in the past 6 months?  No. Has the patient been a risk to others within the distant past?  No.  Current Medications: Current Outpatient Prescriptions  Medication Sig Dispense Refill  . desvenlafaxine (PRISTIQ) 100 MG 24 hr tablet Take 1 tablet (100 mg total) by mouth daily. 30 tablet 2  . furosemide (LASIX) 20 MG tablet     . lamoTRIgine (LAMICTAL) 150 MG tablet Take 1 tablet (150 mg total) by mouth daily. 30 tablet 2  . levofloxacin (LEVAQUIN) 500 MG tablet Take 1 tablet (500 mg total) by mouth daily. 7 tablet 0  . methocarbamol (ROBAXIN) 500 MG tablet Take 1 tablet (500 mg total) by mouth every 8 (eight) hours as needed for muscle spasms. 90 tablet 0  . neomycin-polymyxin b-dexamethasone (MAXITROL) 3.5-10000-0.1 SUSP Place 2 drops into both eyes every 6 (six) hours. 5 mL 0  . prazosin (MINIPRESS) 5 MG capsule Take 1 capsule (5 mg total) by mouth 2 (two) times daily. 180 capsule 3  . SYNTHROID 50 MCG tablet     . tamsulosin (FLOMAX) 0.4 MG CAPS capsule Take 0.4 mg by mouth.    . zolpidem (AMBIEN CR) 12.5 MG CR tablet Take 1 tablet (12.5 mg total) by mouth at bedtime. 90 tablet 1   No current facility-administered medications for this visit.    Medical Decision Making:  Review of Psycho-Social Stressors (1) and Review of Last Therapy Session (1)  Treatment Plan Summary:Medication management    Depression She will continue on Pristiq 100 mg daily. Patient was given 3 month supply of the medications  Mood symptoms Patient will continue on lamotrigine 150 mg daily. She was given 3 month supply of the medication  Insomnia Patient takes Ambien CR 12.5 mg at bedtime and we discussed about the different medication options in detail. She demonstrated understanding.  Follow-up Patient will follow-up in three  months   More than 50% of the time spent in psychoeducation, counseling and coordination of care.  time spent with the patient  25 minutes   This note was generated in part or whole with voice recognition software. Voice regonition is usually quite accurate but there are transcription errors that can and very often do occur. I apologize for any typographical errors that were not detected and corrected.        Rainey Pines, MD    12/01/2015, 9:41 AM

## 2015-12-17 ENCOUNTER — Ambulatory Visit
Admission: RE | Admit: 2015-12-17 | Discharge: 2015-12-17 | Disposition: A | Discharge status: Home / Self Care | Payer: BLUE CROSS/BLUE SHIELD | Source: Ambulatory Visit | Attending: Physical Medicine and Rehabilitation | Admitting: Physical Medicine and Rehabilitation

## 2015-12-17 ENCOUNTER — Ambulatory Visit (INDEPENDENT_AMBULATORY_CARE_PROVIDER_SITE_OTHER): Payer: BLUE CROSS/BLUE SHIELD | Admitting: Psychiatry

## 2015-12-17 ENCOUNTER — Encounter: Payer: Self-pay | Admitting: Psychiatry

## 2015-12-17 VITALS — BP 122/86 | HR 88 | Temp 98.3°F | Ht 69.0 in | Wt 179.4 lb

## 2015-12-17 DIAGNOSIS — M25552 Pain in left hip: Secondary | ICD-10-CM | POA: Diagnosis present

## 2015-12-17 DIAGNOSIS — M25452 Effusion, left hip: Secondary | ICD-10-CM | POA: Insufficient documentation

## 2015-12-17 DIAGNOSIS — F411 Generalized anxiety disorder: Secondary | ICD-10-CM

## 2015-12-17 DIAGNOSIS — F331 Major depressive disorder, recurrent, moderate: Secondary | ICD-10-CM

## 2015-12-17 MED ORDER — BUSPIRONE HCL 10 MG PO TABS: 10.0000 mg | ORAL_TABLET | Freq: Every day | ORAL | Status: DC

## 2015-12-17 MED ORDER — ZOLPIDEM TARTRATE 10 MG PO TABS: 10.0000 mg | ORAL_TABLET | Freq: Every evening | ORAL | Status: DC | PRN

## 2015-12-17 NOTE — Progress Notes (Signed)
BH MD/PA/NP OP Progress Note  12/17/2015 1:03 PM Toni Green  MRN:  [id number]  Subjective:    Patient is a 55 year old married female who presented for follow-up accompanied by her husband. Patient reported that she has been having issues with Ambien CR which was prescribed by her primary care physician. She reported that she could not wait for her next appointment and decided to come earlier. We had discussed that at her last appointment. She reported that she has been having behavioral issues related to the Ambien and she is ready to quit taking the medication. Patient reported that she will not wake up in the middle of the night and will start cooking. She does not remember doing this behavior problems. Her husband has to keep an eye on her. Patient reported that she is scared to taper herself off of the Ambien. She has read up on the Internet about the adverse effects related to the Ambien. However she is ready to stop taking the medication. Her husband remains supportive. Patient reported that she wants to make a plan. She will gradually taper herself out of the Ambien. She reported that she wants to sleep at night and would like to take something else been lieu of Ambien at this time. She appeared apprehensive about tapering herself off of the medication. She currently denied having any mood swings and reported that the medications are currently effective.We discussed at length about the different medication options but she reported that she will discuss with her primary care physician  as well.  Patient appeared very alert and oriented during the interview and she was very receptive to the medication changes.   Chief Complaint:  Chief Complaint    Follow-up; Medication Refill; Medication Problem     Visit Diagnosis:     ICD-9-CM ICD-10-CM   1. MDD (major depressive disorder), recurrent episode, moderate (HCC) 296.32 F33.1   2. Anxiety state 300.00 F41.1     Past Medical History:   Past Medical History  Diagnosis Date  . Hypertension   . Thyroid disease   . Anxiety   . Depression     Past Surgical History  Procedure Laterality Date  . Abdominal hysterectomy    . Gallbladder surgery    . Appendectomy    . Gastric bypass  2008   Family History:  Family History  Problem Relation Age of Onset  . Depression Mother   . Hypertension Mother   . Depression Father   . Hypertension Father   . Depression Sister   . Hypertension Maternal Grandfather   . Depression Maternal Grandmother   . Hypertension Maternal Grandmother   . Hypertension Paternal Grandfather   . Hypertension Paternal Grandmother    Social History:  Social History   Social History  . Marital Status: Married    Spouse Name: N/A  . Number of Children: N/A  . Years of Education: N/A   Social History Main Topics  . Smoking status: Never Smoker   . Smokeless tobacco: Never Used  . Alcohol Use: No  . Drug Use: No  . Sexual Activity: No   Other Topics Concern  . None   Social History Narrative   Additional History:  Lives with her husband and has been working full-time in [place]. She denied use of drugs or alcohol at this time.  Assessment:   Musculoskeletal: Strength & Muscle Tone: within normal limits Gait & Station: normal Patient leans: N/A  Psychiatric Specialty Exam: Anxiety Symptoms include insomnia and nervous/anxious  behavior. Patient reports no suicidal ideas.      Review of Systems  Psychiatric/Behavioral: Positive for substance abuse (overuse of Klonopin as discussed above). Negative for depression, suicidal ideas, hallucinations and memory loss. The patient is nervous/anxious and has insomnia.     Blood pressure 122/86, pulse 88, temperature 98.3 F (36.8 C), temperature source Tympanic, height 5\' 9"  (1.753 m), weight 179 lb 6.4 oz (81.375 kg), SpO2 98 %.Body mass index is 26.48 kg/(m^2).  General Appearance: Fairly Groomed  Eye Contact:  Fair  Speech:  Normal  Rate  Volume:  Normal  Mood:  Euthymic  Affect:  Congruent and Full Range  Thought Process:  Coherent  Orientation:  Full (Time, Place, and Person)  Thought Content:  WDL  Suicidal Thoughts:  No  Homicidal Thoughts:  No  Memory:  Immediate;   Fair  Judgement:  Fair  Insight:  Fair  Psychomotor Activity:  Normal  Concentration:  Fair  Recall:  [employer] of Knowledge: Fair  Language: Fair  Akathisia:  No  Handed:  Right  AIMS (if indicated):  Assets:  Communication Skills Desire for Improvement Physical Health Social Support  ADL's:  Intact  Cognition: WNL  Sleep:     Is the patient at risk to self?  No. Has the patient been a risk to self in the past 6 months?  No. Has the patient been a risk to self within the distant past?  No. Is the patient a risk to others?  No. Has the patient been a risk to others in the past 6 months?  No. Has the patient been a risk to others within the distant past?  No.  Current Medications: Current Outpatient Prescriptions  Medication Sig Dispense Refill  . desvenlafaxine (PRISTIQ) 100 MG 24 hr tablet Take 1 tablet (100 mg total) by mouth daily. 90 tablet 2  . furosemide (LASIX) 20 MG tablet     . HYDROcodone-acetaminophen (NORCO/VICODIN) 5-325 MG tablet Take by mouth.    . lamoTRIgine (LAMICTAL) 150 MG tablet Take 1 tablet (150 mg total) by mouth daily. 90 tablet 2  . levothyroxine (SYNTHROID) 50 MCG tablet Take by mouth.    . methocarbamol (ROBAXIN) 500 MG tablet Take 1 tablet (500 mg total) by mouth every 8 (eight) hours as needed for muscle spasms. 90 tablet 0  . neomycin-polymyxin b-dexamethasone (MAXITROL) 3.5-10000-0.1 SUSP Place 2 drops into both eyes every 6 (six) hours. 5 mL 0  . prazosin (MINIPRESS) 5 MG capsule Take 1 capsule (5 mg total) by mouth 2 (two) times daily. 180 capsule 3  . prazosin (MINIPRESS) 5 MG capsule Take by mouth.    . SYNTHROID 50 MCG tablet     . tamsulosin (FLOMAX) 0.4 MG CAPS capsule Take 0.4 mg by mouth.     Marland Kitchen XIIDRA 5 % SOLN     . zolpidem (AMBIEN CR) 12.5 MG CR tablet Take by mouth.     No current facility-administered medications for this visit.    Medical Decision Making:  Review of Psycho-Social Stressors (1) and Review of Last Therapy Session (1)  Treatment Plan Summary:Medication management    Depression She will continue on Pristiq 100 mg daily. Patient was given 3 month supply of the medications  Mood symptoms Patient will continue on lamotrigine 150 mg daily. She was given 3 month supply of the medication  Insomnia Patient takes Ambien CR 12.5 mg at bedtime and we discussed about the medication changes. She agreed to start taking Ambien 10 mg  at bedtime. She will be tapered down gradually in the medication. She was also advised to start taking BuSpar 10 mg daily at 6 PM after she returns from work so it will help with her anxiety symptoms and she demonstrated understanding. She will follow-up in 2 weeks or earlier depending on her symptoms. She demonstrated understanding.   Follow-up Patient will follow-up in 2 weeks.    More than 50% of the time spent in psychoeducation, counseling and coordination of care.  time spent with the patient 25 minutes   This note was generated in part or whole with voice recognition software. Voice regonition is usually quite accurate but there are transcription errors that can and very often do occur. I apologize for any typographical errors that were not detected and corrected.        Rainey Pines, MD    12/17/2015, 1:03 PM

## 2015-12-31 ENCOUNTER — Encounter: Payer: Self-pay | Admitting: Psychiatry

## 2015-12-31 ENCOUNTER — Ambulatory Visit (INDEPENDENT_AMBULATORY_CARE_PROVIDER_SITE_OTHER): Payer: BLUE CROSS/BLUE SHIELD | Admitting: Psychiatry

## 2015-12-31 ENCOUNTER — Ambulatory Visit: Payer: BLUE CROSS/BLUE SHIELD | Admitting: Psychiatry

## 2015-12-31 VITALS — BP 122/86 | HR 66 | Temp 97.8°F | Ht 69.0 in | Wt 183.6 lb

## 2015-12-31 DIAGNOSIS — F331 Major depressive disorder, recurrent, moderate: Secondary | ICD-10-CM | POA: Diagnosis not present

## 2015-12-31 MED ORDER — TRAZODONE HCL 100 MG PO TABS: 100.0000 mg | ORAL_TABLET | Freq: Every day | ORAL | Status: DC

## 2015-12-31 MED ORDER — BUSPIRONE HCL 10 MG PO TABS: 10.0000 mg | ORAL_TABLET | Freq: Every day | ORAL | Status: DC

## 2015-12-31 NOTE — Progress Notes (Signed)
BH MD/PA/NP OP Progress Note  12/31/2015 9:10 AM Toni Green  MRN:  [id number]  Subjective:    Patient is a 55 year old married female who presented for follow-up accompanied by her husband. Patient reported that she has started decreasing the dose of Ambien and is not taking regular Ambien and has slept well. She reported that she does not have any difficulty sleeping at night. She actually wakes up more energetic. Patient reported that she is willing to decrease the dose of Ambien further. She reported that she is not experiencing any depressive symptoms mood disorder at this time. She appeared energetic during the interview. Her husband reported that she is also improving as well. Patient currently denied having any worsening of her symptoms. We discussed at length about her medications and her husband remains supportive. .   Patient appeared very alert and oriented during the interview and she was very receptive to the medication changes.   Chief Complaint:  Chief Complaint    Follow-up; Medication Refill     Visit Diagnosis:     ICD-9-CM ICD-10-CM   1. MDD (major depressive disorder), recurrent episode, moderate ([place]) 296.32 F33.1     Past Medical History:  Past Medical History  Diagnosis Date  . Hypertension   . Thyroid disease   . Anxiety   . Depression     Past Surgical History  Procedure Laterality Date  . Abdominal hysterectomy    . Gallbladder surgery    . Appendectomy    . Gastric bypass  2008   Family History:  Family History  Problem Relation Age of Onset  . Depression Mother   . Hypertension Mother   . Depression Father   . Hypertension Father   . Depression Sister   . Hypertension Maternal Grandfather   . Depression Maternal Grandmother   . Hypertension Maternal Grandmother   . Hypertension Paternal Grandfather   . Hypertension Paternal Grandmother    Social History:  Social History   Social History  . Marital Status: Married    Spouse Name:  N/A  . Number of Children: N/A  . Years of Education: N/A   Social History Main Topics  . Smoking status: Never Smoker   . Smokeless tobacco: Never Used  . Alcohol Use: No  . Drug Use: No  . Sexual Activity: No   Other Topics Concern  . None   Social History Narrative   Additional History:  Lives with her husband and has been working full-time in [place]. She denied use of drugs or alcohol at this time.  Assessment:   Musculoskeletal: Strength & Muscle Tone: within normal limits Gait & Station: normal Patient leans: N/A  Psychiatric Specialty Exam: Anxiety Symptoms include insomnia and nervous/anxious behavior. Patient reports no suicidal ideas.      Review of Systems  Musculoskeletal: Negative for neck pain.  Psychiatric/Behavioral: Negative for depression, suicidal ideas, hallucinations and memory loss. Substance abuse: overuse of Klonopin as discussed above. The patient is nervous/anxious and has insomnia.     Blood pressure 122/86, pulse 66, temperature 97.8 F (36.6 C), temperature source Tympanic, height 5\' 9"  (1.753 m), weight 183 lb 9.6 oz (83.28 kg), SpO2 96 %.Body mass index is 27.1 kg/(m^2).  General Appearance: Fairly Groomed  Eye Contact:  Fair  Speech:  Normal Rate  Volume:  Normal  Mood:  Euthymic  Affect:  Congruent and Full Range  Thought Process:  Coherent  Orientation:  Full (Time, Place, and Person)  Thought Content:  WDL  Suicidal  Thoughts:  No  Homicidal Thoughts:  No  Memory:  Immediate;   Fair  Judgement:  Fair  Insight:  Fair  Psychomotor Activity:  Normal  Concentration:  Fair  Recall:  [employer] of Knowledge: Fair  Language: Fair  Akathisia:  No  Handed:  Right  AIMS (if indicated):  Assets:  Communication Skills Desire for Improvement Physical Health Social Support  ADL's:  Intact  Cognition: WNL  Sleep:     Is the patient at risk to self?  No. Has the patient been a risk to self in the past 6 months?  No. Has the  patient been a risk to self within the distant past?  No. Is the patient a risk to others?  No. Has the patient been a risk to others in the past 6 months?  No. Has the patient been a risk to others within the distant past?  No.  Current Medications: Current Outpatient Prescriptions  Medication Sig Dispense Refill  . busPIRone (BUSPAR) 10 MG tablet Take 1 tablet (10 mg total) by mouth daily. 30 tablet 0  . desvenlafaxine (PRISTIQ) 100 MG 24 hr tablet Take 1 tablet (100 mg total) by mouth daily. 90 tablet 2  . furosemide (LASIX) 20 MG tablet     . HYDROcodone-acetaminophen (NORCO/VICODIN) 5-325 MG tablet Take by mouth.    . lamoTRIgine (LAMICTAL) 150 MG tablet Take 1 tablet (150 mg total) by mouth daily. 90 tablet 2  . levothyroxine (SYNTHROID) 50 MCG tablet Take by mouth.    . methocarbamol (ROBAXIN) 500 MG tablet Take 1 tablet (500 mg total) by mouth every 8 (eight) hours as needed for muscle spasms. 90 tablet 0  . neomycin-polymyxin b-dexamethasone (MAXITROL) 3.5-10000-0.1 SUSP Place 2 drops into both eyes every 6 (six) hours. 5 mL 0  . prazosin (MINIPRESS) 5 MG capsule Take 1 capsule (5 mg total) by mouth 2 (two) times daily. 180 capsule 3  . prazosin (MINIPRESS) 5 MG capsule Take by mouth.    . SYNTHROID 50 MCG tablet     . tamsulosin (FLOMAX) 0.4 MG CAPS capsule Take 0.4 mg by mouth.    Marland Kitchen XIIDRA 5 % SOLN     . zolpidem (AMBIEN) 10 MG tablet Take 1 tablet (10 mg total) by mouth at bedtime as needed for sleep. 30 tablet 0   No current facility-administered medications for this visit.    Medical Decision Making:  Review of Psycho-Social Stressors (1) and Review of Last Therapy Session (1)  Treatment Plan Summary:Medication management    Depression She will continue on Pristiq 100 mg daily. Patient has supply  Mood symptoms Patient will continue on lamotrigine 150 mg daily. She has supply  Insomnia Patient has supply of Ambien 10 mg. Advised her to decrease the dose to 5 mg and  take it on a when necessary basis. I will start her on trazodone 100 mg by mouth daily at bedtime for insomnia. Advised her to take it on a daily basis and she demonstrated understanding.    Follow-up Patient will follow-up in 4  weeks.    More than 50% of the time spent in psychoeducation, counseling and coordination of care.  time spent with the patient 25 minutes   This note was generated in part or whole with voice recognition software. Voice regonition is usually quite accurate but there are transcription errors that can and very often do occur. I apologize for any typographical errors that were not detected and corrected.  Rainey Pines, MD    12/31/2015, 9:10 AM

## 2016-01-27 ENCOUNTER — Ambulatory Visit (INDEPENDENT_AMBULATORY_CARE_PROVIDER_SITE_OTHER): Payer: BLUE CROSS/BLUE SHIELD | Admitting: Psychiatry

## 2016-01-27 ENCOUNTER — Encounter: Payer: Self-pay | Admitting: Psychiatry

## 2016-01-27 VITALS — BP 138/84 | HR 76 | Temp 98.3°F | Ht 69.0 in | Wt 186.4 lb

## 2016-01-27 DIAGNOSIS — F331 Major depressive disorder, recurrent, moderate: Secondary | ICD-10-CM | POA: Diagnosis not present

## 2016-01-27 NOTE — Progress Notes (Signed)
BH MD/PA/NP OP Progress Note  01/27/2016 11:14 AM Toni Green  MRN:  [id number]  Subjective:    Patient is a 55 year old married female who presented for follow-up accompanied by her husband. Patient reported that she has successfully stopped taking the Ambien and is sleeping without the help of Ambien now. She reported that she takes trazodone at bedtime. She is also taking BuSpar when she will come back from work. Patient reported that she has noticed improvement in her anxiety and depressive symptoms. She is gaining weight as she eats waffles at night. Patient reported that she is going to start exercising and will stop eating at night. She currently denied having any adverse effects of her current medications. She reported that she is feeling more energetic active and  . started decreasing the dose of Ambien and is not taking regular Ambien and has slept well. She reported that she does not have any difficulty sleeping at night. She actually wakes up more energetic. Patient reported that she is willing to decrease the dose of Ambien further. She reported that she is not experiencing any depressive symptoms mood disorder at this time. She appeared energetic during the interview. Her husband reported that she is also improving as well. Patient currently denied having any worsening of her symptoms. We discussed at length about her medications and her husband remains supportive. .   Patient appeared very alert and oriented during the interview and she was very receptive to the medication changes.   Chief Complaint:  Chief Complaint    Follow-up; Medication Refill     Visit Diagnosis:     ICD-9-CM ICD-10-CM   1. MDD (major depressive disorder), recurrent episode, moderate ([place]) 296.32 F33.1     Past Medical History:  Past Medical History  Diagnosis Date  . Hypertension   . Thyroid disease   . Anxiety   . Depression     Past Surgical History  Procedure Laterality Date  . Abdominal  hysterectomy    . Gallbladder surgery    . Appendectomy    . Gastric bypass  2008   Family History:  Family History  Problem Relation Age of Onset  . Depression Mother   . Hypertension Mother   . Depression Father   . Hypertension Father   . Depression Sister   . Hypertension Maternal Grandfather   . Depression Maternal Grandmother   . Hypertension Maternal Grandmother   . Hypertension Paternal Grandfather   . Hypertension Paternal Grandmother    Social History:  Social History   Social History  . Marital Status: Married    Spouse Name: N/A  . Number of Children: N/A  . Years of Education: N/A   Social History Main Topics  . Smoking status: Never Smoker   . Smokeless tobacco: Never Used  . Alcohol Use: No  . Drug Use: No  . Sexual Activity: No   Other Topics Concern  . None   Social History Narrative   Additional History:  Lives with her husband and has been working full-time in [place]. She denied use of drugs or alcohol at this time.  Assessment:   Musculoskeletal: Strength & Muscle Tone: within normal limits Gait & Station: normal Patient leans: N/A  Psychiatric Specialty Exam: Anxiety Symptoms include insomnia and nervous/anxious behavior. Patient reports no suicidal ideas.      Review of Systems  Musculoskeletal: Negative for neck pain.  Psychiatric/Behavioral: Negative for depression, suicidal ideas, hallucinations and memory loss. Substance abuse: overuse of Klonopin as discussed  above. The patient is nervous/anxious and has insomnia.     Blood pressure 138/84, pulse 76, temperature 98.3 F (36.8 C), temperature source Tympanic, height 5\' 9"  (1.753 m), weight 186 lb 6.4 oz (84.55 kg), SpO2 97 %.Body mass index is 27.51 kg/(m^2).  General Appearance: Fairly Groomed  Eye Contact:  Fair  Speech:  Normal Rate  Volume:  Normal  Mood:  Euthymic  Affect:  Congruent and Full Range  Thought Process:  Coherent  Orientation:  Full (Time, Place, and  Person)  Thought Content:  WDL  Suicidal Thoughts:  No  Homicidal Thoughts:  No  Memory:  Immediate;   Fair  Judgement:  Fair  Insight:  Fair  Psychomotor Activity:  Normal  Concentration:  Fair  Recall:  [employer] of Knowledge: Fair  Language: Fair  Akathisia:  No  Handed:  Right  AIMS (if indicated):  Assets:  Communication Skills Desire for Improvement Physical Health Social Support  ADL's:  Intact  Cognition: WNL  Sleep:     Is the patient at risk to self?  No. Has the patient been a risk to self in the past 6 months?  No. Has the patient been a risk to self within the distant past?  No. Is the patient a risk to others?  No. Has the patient been a risk to others in the past 6 months?  No. Has the patient been a risk to others within the distant past?  No.  Current Medications: Current Outpatient Prescriptions  Medication Sig Dispense Refill  . busPIRone (BUSPAR) 10 MG tablet Take 1 tablet (10 mg total) by mouth daily. 30 tablet 1  . desvenlafaxine (PRISTIQ) 100 MG 24 hr tablet Take 1 tablet (100 mg total) by mouth daily. 90 tablet 2  . furosemide (LASIX) 20 MG tablet     . HYDROcodone-acetaminophen (NORCO/VICODIN) 5-325 MG tablet Take by mouth.    . lamoTRIgine (LAMICTAL) 150 MG tablet Take 1 tablet (150 mg total) by mouth daily. 90 tablet 2  . levothyroxine (SYNTHROID) 50 MCG tablet Take by mouth.    . neomycin-polymyxin b-dexamethasone (MAXITROL) 3.5-10000-0.1 SUSP Place 2 drops into both eyes every 6 (six) hours. 5 mL 0  . prazosin (MINIPRESS) 5 MG capsule Take 1 capsule (5 mg total) by mouth 2 (two) times daily. 180 capsule 3  . SYNTHROID 50 MCG tablet     . tamsulosin (FLOMAX) 0.4 MG CAPS capsule Take 0.4 mg by mouth.    . traZODone (DESYREL) 100 MG tablet Take 1 tablet (100 mg total) by mouth at bedtime. 30 tablet 1  . XIIDRA 5 % SOLN      No current facility-administered medications for this visit.    Medical Decision Making:  Review of Psycho-Social  Stressors (1) and Review of Last Therapy Session (1)  Treatment Plan Summary:Medication management    Depression She will continue on Pristiq 100 mg daily. Patient has supply  Mood symptoms Patient will continue on lamotrigine 150 mg daily. She has supply  Insomnia Patient has supply of Ambien 10 mg. Advised her to decrease the dose to 5 mg and take it on a when necessary basis. I will start her on trazodone 100 mg by mouth daily at bedtime for insomnia. Advised her to take it on a daily basis and she demonstrated understanding.    Follow-up Patient will follow-up in 4  weeks.    More than 50% of the time spent in psychoeducation, counseling and coordination of care.  time spent  with the patient 25 minutes   This note was generated in part or whole with voice recognition software. Voice regonition is usually quite accurate but there are transcription errors that can and very often do occur. I apologize for any typographical errors that were not detected and corrected.        Rainey Pines, MD    01/27/2016, 11:14 AM

## 2016-02-11 ENCOUNTER — Other Ambulatory Visit: Payer: Self-pay | Admitting: Internal Medicine

## 2016-02-19 ENCOUNTER — Telehealth: Payer: Self-pay | Admitting: Psychiatry

## 2016-02-22 NOTE — Telephone Encounter (Signed)
She is on generic of Pritiq . Please call pt and let her know.

## 2016-02-23 ENCOUNTER — Encounter: Payer: Self-pay | Admitting: Psychiatry

## 2016-02-23 ENCOUNTER — Ambulatory Visit (INDEPENDENT_AMBULATORY_CARE_PROVIDER_SITE_OTHER): Payer: BLUE CROSS/BLUE SHIELD | Admitting: Psychiatry

## 2016-02-23 VITALS — BP 140/100 | HR 73 | Temp 97.4°F | Ht 69.0 in | Wt 182.4 lb

## 2016-02-23 DIAGNOSIS — F331 Major depressive disorder, recurrent, moderate: Secondary | ICD-10-CM

## 2016-02-23 MED ORDER — GLUCOSAMINE-CHONDROITIN 250-200 MG PO TABS: ORAL_TABLET | ORAL | Status: DC

## 2016-02-23 MED ORDER — TURMERIC POWD: Status: DC

## 2016-02-23 MED ORDER — HYDROXYZINE PAMOATE 25 MG PO CAPS: 25.0000 mg | ORAL_CAPSULE | Freq: Two times a day (BID) | ORAL | Status: DC

## 2016-02-23 MED ORDER — TRAZODONE HCL 100 MG PO TABS: 100.0000 mg | ORAL_TABLET | Freq: Every day | ORAL | Status: DC

## 2016-02-23 MED ORDER — CYANOCOBALAMIN 250 MCG PO TABS: 250.0000 ug | ORAL_TABLET | Freq: Every day | ORAL | Status: DC

## 2016-02-23 NOTE — Progress Notes (Signed)
BH MD/PA/NP OP Progress Note  02/23/2016 9:44 AM Toni Green  MRN:  [id number]  Subjective:    Patient is a 55 year old married female who presented for follow-up accompanied by her husband. Patient reported that she has been sleeping without the help of trazodone. Patient reported that she took melatonin 10 mg last night and slept well. She appeared calm and collective during the interview. Patient reported that she is also not taking the BuSpar as she does not want is any improvement on the medication. She reported that she has anxiety and it will last for approximately 30 minutes or longer. She reported that she does not want to take any medication which can cause dependence. She is willing to try something else. She is very careful in taking the medications and has been taking several different types of vitamins as well. Her husband remains supporting. She has already lost 4 pounds and appeared calm and collective during the interview.    Patient appeared very alert and oriented during the interview and she was very receptive to the medication changes.   Chief Complaint:  Chief Complaint    Follow-up; Medication Refill     Visit Diagnosis:     ICD-9-CM ICD-10-CM   1. MDD (major depressive disorder), recurrent episode, moderate ([place]) 296.32 F33.1     Past Medical History:  Past Medical History  Diagnosis Date  . Hypertension   . Thyroid disease   . Anxiety   . Depression     Past Surgical History  Procedure Laterality Date  . Abdominal hysterectomy    . Gallbladder surgery    . Appendectomy    . Gastric bypass  2008   Family History:  Family History  Problem Relation Age of Onset  . Depression Mother   . Hypertension Mother   . Depression Father   . Hypertension Father   . Depression Sister   . Hypertension Maternal Grandfather   . Depression Maternal Grandmother   . Hypertension Maternal Grandmother   . Hypertension Paternal Grandfather   . Hypertension  Paternal Grandmother    Social History:  Social History   Social History  . Marital Status: Married    Spouse Name: N/A  . Number of Children: N/A  . Years of Education: N/A   Social History Main Topics  . Smoking status: Never Smoker   . Smokeless tobacco: Never Used  . Alcohol Use: No  . Drug Use: No  . Sexual Activity: No   Other Topics Concern  . None   Social History Narrative   Additional History:  Lives with her husband and has been working full-time in [place]. She denied use of drugs or alcohol at this time.  Assessment:   Musculoskeletal: Strength & Muscle Tone: within normal limits Gait & Station: normal Patient leans: N/A  Psychiatric Specialty Exam: Anxiety Symptoms include insomnia and nervous/anxious behavior. Patient reports no suicidal ideas.      Review of Systems  Musculoskeletal: Negative for neck pain.  Psychiatric/Behavioral: Negative for depression, suicidal ideas, hallucinations and memory loss. Substance abuse: overuse of Klonopin as discussed above. The patient is nervous/anxious and has insomnia.     Blood pressure 140/100, pulse 73, temperature 97.4 F (36.3 C), temperature source Tympanic, height 5\' 9"  (1.753 m), weight 182 lb 6.4 oz (82.736 kg), SpO2 94 %.Body mass index is 26.92 kg/(m^2).  General Appearance: Fairly Groomed  Eye Contact:  Fair  Speech:  Normal Rate  Volume:  Normal  Mood:  Euthymic  Affect:  Congruent and Full Range  Thought Process:  Coherent  Orientation:  Full (Time, Place, and Person)  Thought Content:  WDL  Suicidal Thoughts:  No  Homicidal Thoughts:  No  Memory:  Immediate;   Fair  Judgement:  Fair  Insight:  Fair  Psychomotor Activity:  Normal  Concentration:  Fair  Recall:  [employer] of Knowledge: Fair  Language: Fair  Akathisia:  No  Handed:  Right  AIMS (if indicated):  Assets:  Communication Skills Desire for Improvement Physical Health Social Support  ADL's:  Intact  Cognition: WNL   Sleep:     Is the patient at risk to self?  No. Has the patient been a risk to self in the past 6 months?  No. Has the patient been a risk to self within the distant past?  No. Is the patient a risk to others?  No. Has the patient been a risk to others in the past 6 months?  No. Has the patient been a risk to others within the distant past?  No.  Current Medications: Current Outpatient Prescriptions  Medication Sig Dispense Refill  . busPIRone (BUSPAR) 10 MG tablet Take 1 tablet (10 mg total) by mouth daily. 30 tablet 1  . desvenlafaxine (PRISTIQ) 100 MG 24 hr tablet Take 1 tablet (100 mg total) by mouth daily. 90 tablet 2  . furosemide (LASIX) 20 MG tablet TAKE 1 TABLET DAILY 90 tablet 3  . HYDROcodone-acetaminophen (NORCO/VICODIN) 5-325 MG tablet Take by mouth.    . lamoTRIgine (LAMICTAL) 150 MG tablet Take 1 tablet (150 mg total) by mouth daily. 90 tablet 2  . levothyroxine (SYNTHROID) 50 MCG tablet Take by mouth.    . neomycin-polymyxin b-dexamethasone (MAXITROL) 3.5-10000-0.1 SUSP Place 2 drops into both eyes every 6 (six) hours. 5 mL 0  . prazosin (MINIPRESS) 5 MG capsule Take 1 capsule (5 mg total) by mouth 2 (two) times daily. 180 capsule 3  . SYNTHROID 50 MCG tablet     . tamsulosin (FLOMAX) 0.4 MG CAPS capsule Take 0.4 mg by mouth.    . traZODone (DESYREL) 100 MG tablet Take 1 tablet (100 mg total) by mouth at bedtime. 30 tablet 1  . XIIDRA 5 % SOLN      No current facility-administered medications for this visit.    Medical Decision Making:  Review of Psycho-Social Stressors (1) and Review of Last Therapy Session (1)  Treatment Plan Summary:Medication management    Depression She will continue on Pristiq 100 mg daily.  Mood symptoms Patient will continue on lamotrigine 150 mg daily. She has supply  Insomnia Patient is taking trazodone and melatonin to help with the sleep.  Start her on Vistaril 25 mg by mouth twice a day to help with her  anxiety.      Follow-up Patient will follow-up in 4  weeks.    More than 50% of the time spent in psychoeducation, counseling and coordination of care.  time spent with the patient 25 minutes   This note was generated in part or whole with voice recognition software. Voice regonition is usually quite accurate but there are transcription errors that can and very often do occur. I apologize for any typographical errors that were not detected and corrected.        Rainey Pines, MD    02/23/2016, 9:44 AM

## 2016-02-28 ENCOUNTER — Other Ambulatory Visit: Payer: Self-pay | Admitting: Psychiatry

## 2016-03-08 NOTE — Telephone Encounter (Signed)
Left 3 msgs with pt to call the office and also s/w pt said she would call back to make this appt

## 2016-03-17 ENCOUNTER — Other Ambulatory Visit: Payer: Self-pay

## 2016-03-17 NOTE — Telephone Encounter (Signed)
pt called left message that she needed a refill on buspiorone 10mg .

## 2016-03-17 NOTE — Telephone Encounter (Signed)
called spoke with Mrs. Siebert, told her that according to last note pt was not taking that medication.  pt states that she does not take it everyday she just takes it when she needs too.  pt states that the hydroxyzine was not working so she is doing the hydroxyzine and buspar and it seems to help.  So she needs a refill on the buspar.

## 2016-03-18 MED ORDER — BUSPIRONE HCL 10 MG PO TABS: 10.0000 mg | ORAL_TABLET | ORAL | Status: DC

## 2016-03-28 ENCOUNTER — Telehealth: Payer: Self-pay

## 2016-03-29 ENCOUNTER — Telehealth: Payer: Self-pay

## 2016-03-29 NOTE — Telephone Encounter (Signed)
LEFT A MESSAGE THAT SHE NEEDED REFILL ON ANXIETY MEDICATION.

## 2016-03-29 NOTE — Telephone Encounter (Signed)
Please ask pt to make appointment to discuss her meds.

## 2016-03-29 NOTE — Telephone Encounter (Signed)
LEFT MESSAGE ON BOTH PHONES. ASKING HER TO CALL OFFICE BACK.  SHE WAS LEFT A MESSAGE THAT I HAVE BEEN OUT SINCE LAST WEDNESDAY AND THAT I HAVE BEEN SEEING PATIENT AND TRYING TO GET CAUGHT UP ON PAPERWORK.  iT LOOKS LIKE A RX FOR BUSPAR WAS SENT IN LAST WEEK

## 2016-03-29 NOTE — Telephone Encounter (Signed)
PT CALLED [facility], LEFT A MESSAGE THAT SHE HAS NOT HEARD BACK FROM ME TODAY. NEEDS TO FIND OUT STATUS

## 2016-04-14 ENCOUNTER — Encounter: Payer: Self-pay | Admitting: Psychiatry

## 2016-04-14 ENCOUNTER — Ambulatory Visit (INDEPENDENT_AMBULATORY_CARE_PROVIDER_SITE_OTHER): Payer: BLUE CROSS/BLUE SHIELD | Admitting: Psychiatry

## 2016-04-14 VITALS — BP 122/80 | HR 69 | Temp 97.4°F | Ht 69.0 in | Wt 191.6 lb

## 2016-04-14 DIAGNOSIS — F411 Generalized anxiety disorder: Secondary | ICD-10-CM | POA: Diagnosis not present

## 2016-04-14 DIAGNOSIS — F331 Major depressive disorder, recurrent, moderate: Secondary | ICD-10-CM

## 2016-04-14 MED ORDER — BUSPIRONE HCL 10 MG PO TABS: ORAL_TABLET | ORAL | Status: DC

## 2016-04-14 NOTE — Progress Notes (Signed)
BH MD/PA/NP OP Progress Note  04/14/2016 10:00 AM Toni Green  MRN:  [id number]  Subjective:    Patient is a 55 year old married female who presented for follow-up. Patient reported that she has been asked to be experiencing  leg cramping and has been having edema in her legs for the past 2 months. She was showing swelling in her legs. It was noted that patient is allergic to oxycodone and acetaminophen and she was prescribed the same medications by her pain management. Patient reported that she also takes Lasix twice daily without any potassium replacement. She reported that she has been taking trazodone in combination of melatonin to help her sleep at night and her sleep has significantly improved. She appeared calm and alert during the interview. She reported that she has been having anxiety and wants her medication to be adjusted. She is compliant with her medications. She currently denied having any suicidal ideations or plans. We discussed about her medications at length and she is in agreement with the plan.   She  reported that she is gaining weight due to fluid retention at this time. She was concerned about the same.     Chief Complaint:  Chief Complaint    Follow-up; Medication Problem; Weight Gain     Visit Diagnosis:     ICD-9-CM ICD-10-CM   1. MDD (major depressive disorder), recurrent episode, moderate (HCC) 296.32 F33.1   2. Anxiety state 300.00 F41.1     Past Medical History:  Past Medical History  Diagnosis Date  . Hypertension   . Thyroid disease   . Anxiety   . Depression     Past Surgical History  Procedure Laterality Date  . Abdominal hysterectomy    . Gallbladder surgery    . Appendectomy    . Gastric bypass  2008   Family History:  Family History  Problem Relation Age of Onset  . Depression Mother   . Hypertension Mother   . Depression Father   . Hypertension Father   . Depression Sister   . Hypertension Maternal Grandfather   . Depression  Maternal Grandmother   . Hypertension Maternal Grandmother   . Hypertension Paternal Grandfather   . Hypertension Paternal Grandmother    Social History:  Social History   Social History  . Marital Status: Married    Spouse Name: N/A  . Number of Children: N/A  . Years of Education: N/A   Social History Main Topics  . Smoking status: Never Smoker   . Smokeless tobacco: Never Used  . Alcohol Use: No  . Drug Use: No  . Sexual Activity: No   Other Topics Concern  . None   Social History Narrative   Additional History:  Lives with her husband and has been working full-time in [place]. She denied use of drugs or alcohol at this time.  Assessment:   Musculoskeletal: Strength & Muscle Tone: within normal limits Gait & Station: normal Patient leans: N/A  Psychiatric Specialty Exam: Anxiety Symptoms include insomnia and nervous/anxious behavior. Patient reports no suicidal ideas.      Review of Systems  Musculoskeletal: Negative for neck pain.  Psychiatric/Behavioral: Negative for depression, suicidal ideas, hallucinations and memory loss. Substance abuse: overuse of Klonopin as discussed above. The patient is nervous/anxious and has insomnia.     Blood pressure 122/80, pulse 69, temperature 97.4 F (36.3 C), temperature source Tympanic, height 5\' 9"  (1.753 m), weight 191 lb 9.6 oz (86.909 kg), SpO2 95 %.Body mass index is 28.28 kg/(m^2).  General Appearance: Fairly Groomed  Eye Contact:  Fair  Speech:  Normal Rate  Volume:  Normal  Mood:  Euthymic  Affect:  Congruent and Full Range  Thought Process:  Coherent  Orientation:  Full (Time, Place, and Person)  Thought Content:  WDL  Suicidal Thoughts:  No  Homicidal Thoughts:  No  Memory:  Immediate;   Fair  Judgement:  Fair  Insight:  Fair  Psychomotor Activity:  Normal  Concentration:  Fair  Recall:  [employer] of Knowledge: Fair  Language: Fair  Akathisia:  No  Handed:  Right  AIMS (if indicated):  Assets:   Communication Skills Desire for Improvement Physical Health Social Support  ADL's:  Intact  Cognition: WNL  Sleep:     Is the patient at risk to self?  No. Has the patient been a risk to self in the past 6 months?  No. Has the patient been a risk to self within the distant past?  No. Is the patient a risk to others?  No. Has the patient been a risk to others in the past 6 months?  No. Has the patient been a risk to others within the distant past?  No.  Current Medications: Current Outpatient Prescriptions  Medication Sig Dispense Refill  . busPIRone (BUSPAR) 10 MG tablet Take 1 tablet (10 mg total) by mouth 1 day or 1 dose. 30 tablet 0  . desvenlafaxine (PRISTIQ) 100 MG 24 hr tablet Take 1 tablet (100 mg total) by mouth daily. 90 tablet 2  . furosemide (LASIX) 20 MG tablet TAKE 1 TABLET DAILY 90 tablet 3  . Glucosamine-Chondroitin 250-200 MG TABS 1 pill daily  0  . lamoTRIgine (LAMICTAL) 150 MG tablet Take 1 tablet (150 mg total) by mouth daily. 90 tablet 2  . levothyroxine (SYNTHROID) 50 MCG tablet Take by mouth.    . neomycin-polymyxin b-dexamethasone (MAXITROL) 3.5-10000-0.1 SUSP Place 2 drops into both eyes every 6 (six) hours. 5 mL 0  . prazosin (MINIPRESS) 5 MG capsule Take 1 capsule (5 mg total) by mouth 2 (two) times daily. 180 capsule 3  . tamsulosin (FLOMAX) 0.4 MG CAPS capsule Take 0.4 mg by mouth.    . traZODone (DESYREL) 100 MG tablet Take 1 tablet (100 mg total) by mouth at bedtime. 90 tablet 1  . Turmeric POWD 1/4 teaspoon daily  0  . vitamin B-12 (CYANOCOBALAMIN) 250 MCG tablet Take 1 tablet (250 mcg total) by mouth daily.    Marland Kitchen XIIDRA 5 % SOLN      No current facility-administered medications for this visit.    Medical Decision Making:  Review of Psycho-Social Stressors (1) and Review of Last Therapy Session (1)  Treatment Plan Summary:Medication management    Depression She will continue on Pristiq 100 mg daily.  Mood symptoms Patient will continue on  lamotrigine 150 mg daily. She has supply I will start her on BuSpar 10 mg by mouth twice a day to help with her anxiety symptoms. Advised patient to follow-up with her PCP and discuss about addition of potassium to her Lasix and she agreed with the plan. Also advised her to follow with her pain management to discuss about the anti-inflammatory instead of the opioids and she agreed with the plan.   Insomnia Patient is taking trazodone and melatonin to help with the sleep.      Follow-up Patient will follow-up in 4  weeks.    More than 50% of the time spent in psychoeducation, counseling and coordination of  care.  time spent with the patient 25 minutes   This note was generated in part or whole with voice recognition software. Voice regonition is usually quite accurate but there are transcription errors that can and very often do occur. I apologize for any typographical errors that were not detected and corrected.        Rainey Pines, MD    04/14/2016, 10:00 AM

## 2016-04-27 ENCOUNTER — Other Ambulatory Visit: Payer: Self-pay | Admitting: Internal Medicine

## 2016-05-10 ENCOUNTER — Ambulatory Visit: Payer: BLUE CROSS/BLUE SHIELD | Admitting: Psychiatry

## 2016-05-16 ENCOUNTER — Other Ambulatory Visit: Payer: Self-pay | Admitting: Psychiatry

## 2016-05-25 ENCOUNTER — Encounter: Payer: Self-pay | Admitting: Internal Medicine

## 2016-05-25 ENCOUNTER — Ambulatory Visit: Payer: Self-pay | Admitting: Internal Medicine

## 2016-05-25 ENCOUNTER — Ambulatory Visit (INDEPENDENT_AMBULATORY_CARE_PROVIDER_SITE_OTHER): Payer: BLUE CROSS/BLUE SHIELD | Admitting: Psychiatry

## 2016-05-25 ENCOUNTER — Ambulatory Visit (INDEPENDENT_AMBULATORY_CARE_PROVIDER_SITE_OTHER): Payer: BLUE CROSS/BLUE SHIELD | Admitting: Internal Medicine

## 2016-05-25 ENCOUNTER — Encounter: Payer: Self-pay | Admitting: Psychiatry

## 2016-05-25 VITALS — BP 108/70 | HR 58 | Ht 69.0 in | Wt 184.0 lb

## 2016-05-25 VITALS — BP 123/78 | HR 45 | Temp 98.6°F | Ht 69.0 in | Wt 183.0 lb

## 2016-05-25 DIAGNOSIS — E033 Postinfectious hypothyroidism: Secondary | ICD-10-CM | POA: Insufficient documentation

## 2016-05-25 DIAGNOSIS — F332 Major depressive disorder, recurrent severe without psychotic features: Secondary | ICD-10-CM | POA: Diagnosis not present

## 2016-05-25 DIAGNOSIS — F331 Major depressive disorder, recurrent, moderate: Secondary | ICD-10-CM

## 2016-05-25 DIAGNOSIS — F411 Generalized anxiety disorder: Secondary | ICD-10-CM | POA: Diagnosis not present

## 2016-05-25 DIAGNOSIS — Z1159 Encounter for screening for other viral diseases: Secondary | ICD-10-CM

## 2016-05-25 DIAGNOSIS — Z1239 Encounter for other screening for malignant neoplasm of breast: Secondary | ICD-10-CM | POA: Diagnosis not present

## 2016-05-25 DIAGNOSIS — I10 Essential (primary) hypertension: Secondary | ICD-10-CM

## 2016-05-25 DIAGNOSIS — Z23 Encounter for immunization: Secondary | ICD-10-CM | POA: Diagnosis not present

## 2016-05-25 DIAGNOSIS — Z1211 Encounter for screening for malignant neoplasm of colon: Secondary | ICD-10-CM | POA: Diagnosis not present

## 2016-05-25 MED ORDER — TRAZODONE HCL 100 MG PO TABS: 100.0000 mg | ORAL_TABLET | Freq: Every day | ORAL | 1 refills | Status: DC

## 2016-05-25 MED ORDER — LEVOTHYROXINE SODIUM 50 MCG PO TABS: 50.0000 ug | ORAL_TABLET | Freq: Every day | ORAL | 3 refills | Status: DC

## 2016-05-25 MED ORDER — BUSPIRONE HCL 15 MG PO TABS: 15.0000 mg | ORAL_TABLET | Freq: Two times a day (BID) | ORAL | 1 refills | Status: DC

## 2016-05-25 MED ORDER — DESVENLAFAXINE SUCCINATE ER 100 MG PO TB24: 100.0000 mg | ORAL_TABLET | Freq: Every day | ORAL | 2 refills | Status: DC

## 2016-05-25 MED ORDER — LAMOTRIGINE 150 MG PO TABS: 150.0000 mg | ORAL_TABLET | Freq: Every day | ORAL | 2 refills | Status: DC

## 2016-05-25 NOTE — Patient Instructions (Addendum)
Tdap Vaccine (Tetanus, Diphtheria and Pertussis): What You Need to Know 1. Why get vaccinated? Tetanus, diphtheria and pertussis are very serious diseases. Tdap vaccine can protect us from these diseases. And, Tdap vaccine given to pregnant women can protect newborn babies against pertussis. TETANUS (Lockjaw) is rare in the [place] today. It causes painful muscle tightening and stiffness, usually all over the body.  It can lead to tightening of muscles in the head and neck so you can't open your mouth, swallow, or sometimes even breathe. Tetanus kills about 1 out of 10 people who are infected even after receiving the best medical care. DIPHTHERIA is also rare in the [place] today. It can cause a thick coating to form in the back of the throat.  It can lead to breathing problems, heart failure, paralysis, and death. PERTUSSIS (Whooping Cough) causes severe coughing spells, which can cause difficulty breathing, vomiting and disturbed sleep.  It can also lead to weight loss, incontinence, and rib fractures. Up to 2 in 100 adolescents and 5 in 100 adults with pertussis are hospitalized or have complications, which could include pneumonia or death. These diseases are caused by bacteria. Diphtheria and pertussis are spread from person to person through secretions from coughing or sneezing. Tetanus enters the body through cuts, scratches, or wounds. Before vaccines, as many as 200,000 cases of diphtheria, 200,000 cases of pertussis, and hundreds of cases of tetanus, were reported in the [place] each year. Since vaccination began, reports of cases for tetanus and diphtheria have dropped by about 99% and for pertussis by about 80%. 2. Tdap vaccine Tdap vaccine can protect adolescents and adults from tetanus, diphtheria, and pertussis. One dose of Tdap is routinely given at age 11 or 12. People who did not get Tdap at that age should get it as soon as possible. Tdap is especially important  for healthcare professionals and anyone having close contact with a baby younger than 12 months. Pregnant women should get a dose of Tdap during every pregnancy, to protect the newborn from pertussis. Infants are most at risk for severe, life-threatening complications from pertussis. Another vaccine, called Td, protects against tetanus and diphtheria, but not pertussis. A Td booster should be given every 10 years. Tdap may be given as one of these boosters if you have never gotten Tdap before. Tdap may also be given after a severe cut or burn to prevent tetanus infection. Your doctor or the person giving you the vaccine can give you more information. Tdap may safely be given at the same time as other vaccines. 3. Some people should not get this vaccine  A person who has ever had a life-threatening allergic reaction after a previous dose of any diphtheria, tetanus or pertussis containing vaccine, OR has a severe allergy to any part of this vaccine, should not get Tdap vaccine. Tell the person giving the vaccine about any severe allergies.  Anyone who had coma or long repeated seizures within 7 days after a childhood dose of DTP or DTaP, or a previous dose of Tdap, should not get Tdap, unless a cause other than the vaccine was found. They can still get Td.  Talk to your doctor if you:  have seizures or another nervous system problem,  had severe pain or swelling after any vaccine containing diphtheria, tetanus or pertussis,  ever had a condition called Guillain-Barr Syndrome (GBS),  aren't feeling well on the day the shot is scheduled. 4. Risks With any medicine, including vaccines, there is   a chance of side effects. These are usually mild and go away on their own. Serious reactions are also possible but are rare. Most people who get Tdap vaccine do not have any problems with it. Mild problems following Tdap (Did not interfere with activities)  Pain where the shot was given (about 3 in 4  adolescents or 2 in 3 adults)  Redness or swelling where the shot was given (about 1 person in 5)  Mild fever of at least 100.4F (up to about 1 in 25 adolescents or 1 in 100 adults)  Headache (about 3 or 4 people in 10)  Tiredness (about 1 person in 3 or 4)  Nausea, vomiting, diarrhea, stomach ache (up to 1 in 4 adolescents or 1 in 10 adults)  Chills, sore joints (about 1 person in 10)  Body aches (about 1 person in 3 or 4)  Rash, swollen glands (uncommon) Moderate problems following Tdap (Interfered with activities, but did not require medical attention)  Pain where the shot was given (up to 1 in 5 or 6)  Redness or swelling where the shot was given (up to about 1 in 16 adolescents or 1 in 12 adults)  Fever over 102F (about 1 in 100 adolescents or 1 in 250 adults)  Headache (about 1 in 7 adolescents or 1 in 10 adults)  Nausea, vomiting, diarrhea, stomach ache (up to 1 or 3 people in 100)  Swelling of the entire arm where the shot was given (up to about 1 in 500). Severe problems following Tdap (Unable to perform usual activities; required medical attention)  Swelling, severe pain, bleeding and redness in the arm where the shot was given (rare). Problems that could happen after any vaccine:  People sometimes faint after a medical procedure, including vaccination. Sitting or lying down for about 15 minutes can help prevent fainting, and injuries caused by a fall. Tell your doctor if you feel dizzy, or have vision changes or ringing in the ears.  Some people get severe pain in the shoulder and have difficulty moving the arm where a shot was given. This happens very rarely.  Any medication can cause a severe allergic reaction. Such reactions from a vaccine are very rare, estimated at fewer than 1 in a million doses, and would happen within a few minutes to a few hours after the vaccination. As with any medicine, there is a very remote chance of a vaccine causing a serious  injury or death. The safety of vaccines is always being monitored. For more information, visit: www.cdc.gov/vaccinesafety/ 5. What if there is a serious problem? What should I look for?  Look for anything that concerns you, such as signs of a severe allergic reaction, very high fever, or unusual behavior.  Signs of a severe allergic reaction can include hives, swelling of the face and throat, difficulty breathing, a fast heartbeat, dizziness, and weakness. These would usually start a few minutes to a few hours after the vaccination. What should I do?  If you think it is a severe allergic reaction or other emergency that can't wait, call 9-1-1 or get the person to the nearest hospital. Otherwise, call your doctor.  Afterward, the reaction should be reported to the Vaccine Adverse Event Reporting System (VAERS). Your doctor might file this report, or you can do it yourself through the VAERS web site at www.vaers.hhs.gov, or by calling 1-800-822-7967. VAERS does not give medical advice.  6. The National Vaccine Injury Compensation Program The National Vaccine Injury Compensation Program (  VICP) is a federal program that was created to compensate people who may have been injured by certain vaccines. Persons who believe they may have been injured by a vaccine can learn about the program and about filing a claim by calling 408-860-0917 or visiting the [place] website at GoldCloset.com.ee. There is a time limit to file a claim for compensation. 7. How can I learn more?  Ask your doctor. He or she can give you the vaccine package insert or suggest other sources of information.  Call your local or state health department.  Contact the Centers for Disease Control and Prevention (CDC):  Call 510-191-3826 (1-800-CDC-INFO) or  Visit CDC's website at http://hunter.com/ CDC Tdap Vaccine VIS (12/10/13)   This information is not intended to replace advice given to you by your health care  provider. Make sure you discuss any questions you have with your health care provider.   Document Released: 04/03/2012 Document Revised: 10/24/2014 Document Reviewed: 01/15/2014 Elsevier Interactive Patient Education 2016 [place] Practicing breast self-awareness may pick up problems early, prevent significant medical complications, and possibly save your life. By practicing breast self-awareness, you can become familiar with how your breasts look and feel and if your breasts are changing. This allows you to notice changes early. It can also offer you some reassurance that your breast health is good. One way to learn what is normal for your breasts and whether your breasts are changing is to do a breast self-exam. If you find a lump or something that was not present in the past, it is best to contact your caregiver right away. Other findings that should be evaluated by your caregiver include nipple discharge, especially if it is bloody; skin changes or reddening; areas where the skin seems to be pulled in (retracted); or new lumps and bumps. Breast pain is seldom associated with cancer (malignancy), but should also be evaluated by a caregiver. HOW TO PERFORM A BREAST SELF-EXAM The best time to examine your breasts is 5-7 days after your menstrual period is over. During menstruation, the breasts are lumpier, and it may be more difficult to pick up changes. If you do not menstruate, have reached menopause, or had your uterus removed (hysterectomy), you should examine your breasts at regular intervals, such as monthly. If you are breastfeeding, examine your breasts after a feeding or after using a breast pump. Breast implants do not decrease the risk for lumps or tumors, so continue to perform breast self-exams as recommended. Talk to your caregiver about how to determine the difference between the implant and breast tissue. Also, talk about the amount of pressure you should use  during the exam. Over time, you will become more familiar with the variations of your breasts and more comfortable with the exam. A breast self-exam requires you to remove all your clothes above the waist. 1. Look at your breasts and nipples. Stand in front of a mirror in a room with good lighting. With your hands on your hips, push your hands firmly downward. Look for a difference in shape, contour, and size from one breast to the other (asymmetry). Asymmetry includes puckers, dips, or bumps. Also, look for skin changes, such as reddened or scaly areas on the breasts. Look for nipple changes, such as discharge, dimpling, repositioning, or redness. 2. Carefully feel your breasts. This is best done either in the shower or tub while using soapy water or when flat on your back. Place the arm (on the side of the breast  you are examining) above your head. Use the pads (not the fingertips) of your three middle fingers on your opposite hand to feel your breasts. Start in the underarm area and use  inch (2 cm) overlapping circles to feel your breast. Use 3 different levels of pressure (light, medium, and firm pressure) at each circle before moving to the next circle. The light pressure is needed to feel the tissue closest to the skin. The medium pressure will help to feel breast tissue a little deeper, while the firm pressure is needed to feel the tissue close to the ribs. Continue the overlapping circles, moving downward over the breast until you feel your ribs below your breast. Then, move one finger-width towards the center of the body. Continue to use the  inch (2 cm) overlapping circles to feel your breast as you move slowly up toward the collar bone (clavicle) near the base of the neck. Continue the up and down exam using all 3 pressures until you reach the middle of the chest. Do this with each breast, carefully feeling for lumps or changes. 3.  Keep a written record with breast changes or normal findings for  each breast. By writing this information down, you do not need to depend only on memory for size, tenderness, or location. Write down where you are in your menstrual cycle, if you are still menstruating. Breast tissue can have some lumps or thick tissue. However, see your caregiver if you find anything that concerns you.  SEEK MEDICAL CARE IF:  You see a change in shape, contour, or size of your breasts or nipples.   You see skin changes, such as reddened or scaly areas on the breasts or nipples.   You have an unusual discharge from your nipples.   You feel a new lump or unusually thick areas.    This information is not intended to replace advice given to you by your health care provider. Make sure you discuss any questions you have with your health care provider.   Document Released: 10/03/2005 Document Revised: 09/19/2012 Document Reviewed: 01/18/2012 Elsevier Interactive Patient Education [employer].

## 2016-05-25 NOTE — Progress Notes (Signed)
Date:  05/25/2016   Name:  Toni Green   DOB:  May 26, 1961   MRN:  [id number]   Chief Complaint: Hypothyroidism and Hypertension Hypertension  This is a chronic problem. The current episode started more than 1 year ago. The problem is unchanged. The problem is controlled. Pertinent negatives include no chest pain, headaches, palpitations or shortness of breath. Hypertensive end-organ damage includes a thyroid problem.  Thyroid Problem  Presents for follow-up visit. Patient reports no anxiety, constipation, diarrhea, fatigue, palpitations or tremors. The symptoms have been stable.  Depression         This is a chronic problem.  The problem has been gradually improving since onset.  Associated symptoms include no fatigue and no headaches.  Past medical history includes thyroid problem.    She has not had blood work in some time. She's not had a mammogram in a number of years but is ready to proceed. She also has never had a colonoscopy but agrees to a referral for that today.  Overall she is doing quite well from a depression and anxiety standpoint. She feels the clearest and most functional as she has in years. She's off of Ambien and clonazepam. She uses melatonin for sleep.   Review of Systems  Constitutional: Negative for chills, fatigue and fever.  HENT: Negative for congestion, hearing loss, tinnitus, trouble swallowing and voice change.   Eyes: Negative for visual disturbance.  Respiratory: Negative for cough, chest tightness, shortness of breath and wheezing.   Cardiovascular: Negative for chest pain, palpitations and leg swelling.  Gastrointestinal: Negative for abdominal pain, constipation, diarrhea and vomiting.  Endocrine: Negative for polydipsia and polyuria.  Genitourinary: Negative for dysuria, frequency, genital sores, vaginal bleeding and vaginal discharge.  Musculoskeletal: Negative for arthralgias, gait problem and joint swelling.  Skin: Negative for color change and  rash.  Neurological: Negative for dizziness, tremors, light-headedness and headaches.  Hematological: Negative for adenopathy. Does not bruise/bleed easily.  Psychiatric/Behavioral: Positive for depression. Negative for dysphoric mood and sleep disturbance. The patient is not nervous/anxious.     Patient Active Problem List   Diagnosis Date Noted  . Essential (primary) hypertension 07/20/2015  . 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 disorder 07/20/2015  . 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 Severe episode of recurrent major depressive disorder, without psychotic features ([place]) 07/20/2015  . Borderline personality disorder 04/23/2015  . 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 Complication of surgery [id number]  . Bursitis, trochanteric 10/01/2014  . Inflammation of sacroiliac joint ([place]) 08/22/2014  . 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 DDD (degenerative disc disease), cervical 03/17/2014  . Arthropathy of cervical facet joint ([place]) 03/17/2014  . Hyperoxaluria ([place]) 08/15/2013  . Calculus of kidney 02/13/2013    Prior to Admission medications   Medication Sig Start Date End Date Taking? Authorizing Provider  busPIRone (BUSPAR) 15 MG tablet Take 1 tablet (15 mg total) by mouth 2 (two) times daily. 05/25/16  Yes Rainey Pines, MD  desvenlafaxine (PRISTIQ) 100 MG 24 hr tablet Take 1 tablet (100 mg total) by mouth daily. 05/25/16  Yes Rainey Pines, MD  furosemide (LASIX) 20 MG tablet TAKE 1 TABLET DAILY 02/12/16  Yes Glean Hess, MD  Glucosamine-Chondroitin 250-200 MG TABS 1 pill daily 02/23/16  Yes Rainey Pines, MD  HYDROcodone-acetaminophen (NORCO/VICODIN) 5-325 MG tablet  03/08/16  Yes Historical Provider, MD  hydrOXYzine (VISTARIL) 25 MG capsule Take 1 capsule by mouth 2 (two) times daily. 02/23/16  Yes Historical Provider, MD  lamoTRIgine (LAMICTAL) 150 MG tablet Take 1 tablet (150 mg total) by mouth daily. 05/25/16  Yes Rainey Pines, MD  levothyroxine (SYNTHROID) 50 MCG  tablet Take by mouth. 03/12/09  Yes Historical Provider, MD  prazosin (MINIPRESS) 5 MG capsule Take 1 capsule (5 mg total) by mouth 2 (two) times daily. 06/11/15  Yes Glean Hess, MD  tamsulosin (FLOMAX) 0.4 MG CAPS capsule Take 0.4 mg by mouth. 06/28/13  Yes Historical Provider, MD  traZODone (DESYREL) 100 MG tablet Take 1 tablet (100 mg total) by mouth at bedtime. 05/25/16  Yes Rainey Pines, MD  Turmeric POWD 1/4 teaspoon daily 02/23/16  Yes Rainey Pines, MD  vitamin B-12 (CYANOCOBALAMIN) 250 MCG tablet Take 1 tablet (250 mcg total) by mouth daily. 02/23/16  Yes Rainey Pines, MD  neomycin-polymyxin b-dexamethasone (MAXITROL) 3.5-10000-0.1 SUSP Place 2 drops into both eyes every 6 (six) hours. Patient not taking: Reported on 05/25/2016 10/20/15   Glean Hess, MD  XIIDRA 5 % SOLN  12/15/15   Historical Provider, MD    Allergies  Allergen Reactions  . Losartan   . Oxycodone-Acetaminophen Other (See Comments)    Other reaction(s): ITCHING  . Oxycodone-Acetaminophen Itching  . Penicillins Rash    Past Surgical History:  Procedure Laterality Date  . ABDOMINAL HYSTERECTOMY    . APPENDECTOMY    . GALLBLADDER SURGERY    . GASTRIC BYPASS  2008    Social History  Substance Use Topics  . Smoking status: Never Smoker  . Smokeless tobacco: Never Used  . Alcohol use No     Medication list has been reviewed and updated.   Physical Exam  Constitutional: She is oriented to person, place, and time. She appears well-developed and well-nourished. No distress.  HENT:  Head: Normocephalic and atraumatic.  Right Ear: Tympanic membrane and ear canal normal.  Left Ear: Tympanic membrane and ear canal normal.  Nose: Right sinus exhibits no maxillary sinus tenderness. Left sinus exhibits no maxillary sinus tenderness.  Mouth/Throat: Uvula is midline and oropharynx is clear and moist.  Eyes: Conjunctivae and EOM are normal. Right eye exhibits no discharge. Left eye exhibits no discharge. No scleral  icterus.  Neck: Normal range of motion. Carotid bruit is not present. No erythema present. No thyromegaly present.  Cardiovascular: Normal rate, regular rhythm, normal heart sounds and normal pulses.   Pulmonary/Chest: Effort normal. No respiratory distress. She has no wheezes.  Abdominal: Soft. Bowel sounds are normal. There is no hepatosplenomegaly. There is no tenderness. There is no CVA tenderness.  Musculoskeletal: Normal range of motion.  Lymphadenopathy:    She has no cervical adenopathy.    She has no axillary adenopathy.  Neurological: She is alert and oriented to person, place, and time. She has normal reflexes. No cranial nerve deficit or sensory deficit.  Skin: Skin is warm, dry and intact. No rash noted.  Psychiatric: She has a normal mood and affect. Her speech is normal and behavior is normal. Thought content normal.  Nursing note and vitals reviewed.   BP 108/70   Pulse (!) 58   Ht 5\' 9"  (1.753 m)   Wt 184 lb (83.5 kg)   BMI 27.17 kg/m   Assessment and Plan: 1. Essential (primary) hypertension controlled - CBC with Differential/Platelet  2. Breast cancer screening - MM DIGITAL SCREENING BILATERAL; Future  3. Severe episode of recurrent major depressive disorder, without psychotic features ([place]) Doing well - continue Psych follow up - Comprehensive metabolic panel  4. Postinfectious hypothyroidism supplemented - levothyroxine (  SYNTHROID) 50 MCG tablet; Take 1 tablet (50 mcg total) by mouth daily before breakfast.  Dispense: 90 tablet; Refill: 3 - TSH - Lipid panel  5. Colon cancer screening - Ambulatory referral to Gastroenterology  6. Need for diphtheria-tetanus-pertussis (Tdap) vaccine - Tdap vaccine greater than or equal to 7yo IM  7. Need for hepatitis C screening test - Hepatitis C antibody   Halina Maidens, MD [facility]  05/25/2016

## 2016-05-25 NOTE — Progress Notes (Signed)
BH MD/PA/NP OP Progress Note  05/25/2016 9:10 AM Toni Green  MRN:  [id number]  Subjective:    Patient is a 55 year old married female who presented for follow-up accompanied by her husband. She reported that she has been feeling anxious during the day and wants her medications were adjusted. She reported that she has been taking hydroxyzine and BuSpar. She has not received a refill on the BuSpar although it was sent in on August 2. Patient reported that she has stopped taking the pain medication as she was allergic to the same. We discussed about her eating habits at length. Patient appeared to have an eating disorder as she does not eat throughout the day and drinks 2-3 cups of caffeine a regular basis. She currently minimizes her eating problems as she has been trying to lose weight. She denied having any suicidal ideation or plans. She lives and issues with eating disorder. She reported that she started eating healthy on a regular basis.  Her husband remains supportive. She is very excited that she was able to get herself off of the Klonopin and Ambien in the past year.       Chief Complaint:  Chief Complaint    Follow-up; Medication Refill     Visit Diagnosis:     ICD-9-CM ICD-10-CM   1. MDD (major depressive disorder), recurrent episode, moderate (HCC) 296.32 F33.1   2. Anxiety state 300.00 F41.1     Past Medical History:  Past Medical History:  Diagnosis Date  . Anxiety   . Depression   . Hypertension   . Thyroid disease     Past Surgical History:  Procedure Laterality Date  . ABDOMINAL HYSTERECTOMY    . APPENDECTOMY    . GALLBLADDER SURGERY    . GASTRIC BYPASS  2008   Family History:  Family History  Problem Relation Age of Onset  . Depression Mother   . Hypertension Mother   . Depression Father   . Hypertension Father   . Depression Sister   . Hypertension Maternal Grandfather   . Depression Maternal Grandmother   . Hypertension Maternal Grandmother   .  Hypertension Paternal Grandfather   . Hypertension Paternal Grandmother    Social History:  Social History   Social History  . Marital status: Married    Spouse name: N/A  . Number of children: N/A  . Years of education: N/A   Social History Main Topics  . Smoking status: Never Smoker  . Smokeless tobacco: Never Used  . Alcohol use No  . Drug use: No  . Sexual activity: No   Other Topics Concern  . None   Social History Narrative  . None   Additional History:  Lives with her husband and has been working full-time in [place]. She denied use of drugs or alcohol at this time.  Assessment:   Musculoskeletal: Strength & Muscle Tone: within normal limits Gait & Station: normal Patient leans: N/A  Psychiatric Specialty Exam: Anxiety  Symptoms include insomnia and nervous/anxious behavior. Patient reports no suicidal ideas.    Medication Refill  Pertinent negatives include no neck pain.    Review of Systems  Musculoskeletal: Negative for neck pain.  Psychiatric/Behavioral: Negative for depression, hallucinations, memory loss and suicidal ideas. Substance abuse: overuse of Klonopin as discussed above. The patient is nervous/anxious and has insomnia.     Blood pressure 123/78, pulse (!) 45, temperature 98.6 F (37 C), temperature source Oral, height 5\' 9"  (1.753 m), weight 183 lb (83 kg).Body mass  index is 27.02 kg/m.  General Appearance: Fairly Groomed  Eye Contact:  Fair  Speech:  Normal Rate  Volume:  Normal  Mood:  Euthymic  Affect:  Congruent and Full Range  Thought Process:  Coherent  Orientation:  Full (Time, Place, and Person)  Thought Content:  WDL  Suicidal Thoughts:  No  Homicidal Thoughts:  No  Memory:  Immediate;   Fair  Judgement:  Fair  Insight:  Fair  Psychomotor Activity:  Normal  Concentration:  Fair  Recall:  [employer] of Knowledge: Fair  Language: Fair  Akathisia:  No  Handed:  Right  AIMS (if indicated):  Assets:  Communication  Skills Desire for Improvement Physical Health Social Support  ADL's:  Intact  Cognition: WNL  Sleep:     Is the patient at risk to self?  No. Has the patient been a risk to self in the past 6 months?  No. Has the patient been a risk to self within the distant past?  No. Is the patient a risk to others?  No. Has the patient been a risk to others in the past 6 months?  No. Has the patient been a risk to others within the distant past?  No.  Current Medications: Current Outpatient Prescriptions  Medication Sig Dispense Refill  . busPIRone (BUSPAR) 15 MG tablet Take 1 tablet (15 mg total) by mouth 2 (two) times daily. 180 tablet 1  . desvenlafaxine (PRISTIQ) 100 MG 24 hr tablet Take 1 tablet (100 mg total) by mouth daily. 90 tablet 2  . furosemide (LASIX) 20 MG tablet TAKE 1 TABLET DAILY 90 tablet 3  . Glucosamine-Chondroitin 250-200 MG TABS 1 pill daily  0  . HYDROcodone-acetaminophen (NORCO/VICODIN) 5-325 MG tablet     . hydrOXYzine (VISTARIL) 25 MG capsule Take 1 capsule by mouth 2 (two) times daily.    Marland Kitchen lamoTRIgine (LAMICTAL) 150 MG tablet Take 1 tablet (150 mg total) by mouth daily. 90 tablet 2  . levothyroxine (SYNTHROID) 50 MCG tablet Take by mouth.    . neomycin-polymyxin b-dexamethasone (MAXITROL) 3.5-10000-0.1 SUSP Place 2 drops into both eyes every 6 (six) hours. 5 mL 0  . prazosin (MINIPRESS) 5 MG capsule Take 1 capsule (5 mg total) by mouth 2 (two) times daily. 180 capsule 3  . tamsulosin (FLOMAX) 0.4 MG CAPS capsule Take 0.4 mg by mouth.    . traZODone (DESYREL) 100 MG tablet Take 1 tablet (100 mg total) by mouth at bedtime. 90 tablet 1  . Turmeric POWD 1/4 teaspoon daily  0  . vitamin B-12 (CYANOCOBALAMIN) 250 MCG tablet Take 1 tablet (250 mcg total) by mouth daily.    Marland Kitchen XIIDRA 5 % SOLN      No current facility-administered medications for this visit.     Medical Decision Making:  Review of Psycho-Social Stressors (1) and Review of Last Therapy Session  (1)  Treatment Plan Summary:Medication management    Depression She will continue on Pristiq 100 mg daily.  Mood symptoms Patient will continue on lamotrigine 150 mg daily.  I will start her on BuSpar 15 mg by mouth twice a day to help with her anxiety symptoms. Discussed with her about eating healthy on a daily basis and especially to start eating lunch and she agreed with the plan. She will follow-up in 2 months or earlier depending on her symptoms  Patient is taking trazodone and melatonin to help with the sleep.    More than 50% of the time spent in psychoeducation,  counseling and coordination of care.  time spent with the patient 25 minutes   This note was generated in part or whole with voice recognition software. Voice regonition is usually quite accurate but there are transcription errors that can and very often do occur. I apologize for any typographical errors that were not detected and corrected.        Rainey Pines, MD    05/25/2016, 9:10 AM

## 2016-05-26 LAB — COMPREHENSIVE METABOLIC PANEL
ALT: 43 IU/L — ABNORMAL HIGH (ref 0–32)
AST: 50 IU/L — ABNORMAL HIGH (ref 0–40)
Albumin/Globulin Ratio: 2 (ref 1.2–2.2)
Albumin: 4.4 g/dL (ref 3.5–5.5)
Alkaline Phosphatase: 244 IU/L — ABNORMAL HIGH (ref 39–117)
BUN/Creatinine Ratio: 18 (ref 9–23)
BUN: 16 mg/dL (ref 6–24)
Bilirubin Total: <0.2 mg/dL (ref 0.0–1.2)
CO2: 27 mmol/L (ref 18–29)
Calcium: 9.6 mg/dL (ref 8.7–10.2)
Chloride: 98 mmol/L (ref 96–106)
Creatinine, Ser: 0.91 mg/dL (ref 0.57–1.00)
GFR calc Af Amer: 83 mL/min/{1.73_m2} (ref >59)
GFR calc non Af Amer: 72 mL/min/{1.73_m2} (ref >59)
Globulin, Total: 2.2 g/dL (ref 1.5–4.5)
Glucose: 109 mg/dL — ABNORMAL HIGH (ref 65–99)
Potassium: 4.7 mmol/L (ref 3.5–5.2)
Sodium: 140 mmol/L (ref 134–144)
Total Protein: 6.6 g/dL (ref 6.0–8.5)

## 2016-05-26 LAB — CBC WITH DIFFERENTIAL/PLATELET
Basophils Absolute: 0 10*3/uL (ref 0.0–0.2)
Basos: 0 %
EOS (ABSOLUTE): 0 10*3/uL (ref 0.0–0.4)
Eos: 1 %
Hematocrit: 36.5 % (ref 34.0–46.6)
Hemoglobin: 12.2 g/dL (ref 11.1–15.9)
Immature Grans (Abs): 0 10*3/uL (ref 0.0–0.1)
Immature Granulocytes: 0 %
Lymphocytes Absolute: 0.6 10*3/uL — ABNORMAL LOW (ref 0.7–3.1)
Lymphs: 9 %
MCH: 31 pg (ref 26.6–33.0)
MCHC: 33.4 g/dL (ref 31.5–35.7)
MCV: 93 fL (ref 79–97)
Monocytes Absolute: 0.1 10*3/uL (ref 0.1–0.9)
Monocytes: 1 %
Neutrophils Absolute: 6.2 10*3/uL (ref 1.4–7.0)
Neutrophils: 89 %
Platelets: 358 10*3/uL (ref 150–379)
RBC: 3.93 x10E6/uL (ref 3.77–5.28)
RDW: 13.6 % (ref 12.3–15.4)
WBC: 7 10*3/uL (ref 3.4–10.8)

## 2016-05-26 LAB — LIPID PANEL
Chol/HDL Ratio: 2.6 ratio units (ref 0.0–4.4)
Cholesterol, Total: 185 mg/dL (ref 100–199)
HDL: 71 mg/dL (ref >39)
LDL Calculated: 95 mg/dL (ref 0–99)
Triglycerides: 94 mg/dL (ref 0–149)
VLDL Cholesterol Cal: 19 mg/dL (ref 5–40)

## 2016-05-26 LAB — HEPATITIS C ANTIBODY: Hep C Virus Ab: <0.1 s/co ratio (ref 0.0–0.9)

## 2016-05-26 LAB — TSH: TSH: 2.87 u[IU]/mL (ref 0.450–4.500)

## 2016-06-07 ENCOUNTER — Other Ambulatory Visit: Payer: Self-pay

## 2016-06-07 ENCOUNTER — Telehealth: Payer: Self-pay

## 2016-06-07 NOTE — Telephone Encounter (Signed)
Screening Colonoscopy Z12.11 [facility] [id number] Please pre cert

## 2016-06-07 NOTE — Telephone Encounter (Signed)
Patient changed her mind and would like to schedule her colonoscopy with [place].

## 2016-06-07 NOTE — Telephone Encounter (Signed)
Gastroenterology Pre-Procedure Review  Request Date:  Requesting Physician:   PATIENT REVIEW QUESTIONS: The patient responded to the following health history questions as indicated:    1. Are you having any GI issues? no 2. Do you have a personal history of Polyps? no 3. Do you have a family history of Colon Cancer or Polyps? no 4. Diabetes Mellitus? no 5. Joint replacements in the past 12 months?no 6. Major health problems in the past 3 months?no 7. Any artificial heart valves, MVP, or defibrillator?no    MEDICATIONS & ALLERGIES:    Patient reports the following regarding taking any anticoagulation/antiplatelet therapy:   Plavix, Coumadin, Eliquis, Xarelto, Lovenox, Pradaxa, Brilinta, or Effient? no Aspirin? no  Patient confirms/reports the following medications:  Current Outpatient Prescriptions  Medication Sig Dispense Refill  . busPIRone (BUSPAR) 15 MG tablet Take 1 tablet (15 mg total) by mouth 2 (two) times daily. 180 tablet 1  . desvenlafaxine (PRISTIQ) 100 MG 24 hr tablet Take 1 tablet (100 mg total) by mouth daily. 90 tablet 2  . furosemide (LASIX) 20 MG tablet TAKE 1 TABLET DAILY 90 tablet 3  . HYDROcodone-acetaminophen (NORCO/VICODIN) 5-325 MG tablet     . lamoTRIgine (LAMICTAL) 150 MG tablet Take 1 tablet (150 mg total) by mouth daily. 90 tablet 2  . levothyroxine (SYNTHROID) 50 MCG tablet Take 1 tablet (50 mcg total) by mouth daily before breakfast. 90 tablet 3  . prazosin (MINIPRESS) 5 MG capsule Take 1 capsule (5 mg total) by mouth 2 (two) times daily. (Patient taking differently: Take 5 mg by mouth at bedtime. ) 180 capsule 3  . tamsulosin (FLOMAX) 0.4 MG CAPS capsule Take 0.4 mg by mouth.    . traZODone (DESYREL) 100 MG tablet Take 1 tablet (100 mg total) by mouth at bedtime. 90 tablet 1  . vitamin B-12 (CYANOCOBALAMIN) 250 MCG tablet Take 1 tablet (250 mcg total) by mouth daily.    . hydrOXYzine (VISTARIL) 25 MG capsule Take 1 capsule by mouth 2 (two) times daily.      No current facility-administered medications for this visit.     Patient confirms/reports the following allergies:  Allergies  Allergen Reactions  . Losartan   . Oxycodone-Acetaminophen Other (See Comments)    Other reaction(s): ITCHING  . Penicillins Rash    No orders of the defined types were placed in this encounter.   AUTHORIZATION INFORMATION Primary Insurance: 1D#: Group #:  Secondary Insurance: 1D#: Group #:  SCHEDULE INFORMATION: Date:  Time: Location:

## 2016-07-25 ENCOUNTER — Emergency Department: Payer: BLUE CROSS/BLUE SHIELD

## 2016-07-25 ENCOUNTER — Emergency Department
Admission: EM | Admit: 2016-07-25 | Discharge: 2016-07-25 | Disposition: A | Discharge status: Home / Self Care | Payer: BLUE CROSS/BLUE SHIELD | Attending: Emergency Medicine | Admitting: Emergency Medicine

## 2016-07-25 ENCOUNTER — Encounter: Payer: Self-pay | Admitting: Psychiatry

## 2016-07-25 ENCOUNTER — Ambulatory Visit (INDEPENDENT_AMBULATORY_CARE_PROVIDER_SITE_OTHER): Payer: BLUE CROSS/BLUE SHIELD | Admitting: Psychiatry

## 2016-07-25 ENCOUNTER — Ambulatory Visit: Payer: BLUE CROSS/BLUE SHIELD

## 2016-07-25 VITALS — BP 173/108 | HR 65 | Temp 98.3°F | Wt 179.6 lb

## 2016-07-25 DIAGNOSIS — F331 Major depressive disorder, recurrent, moderate: Secondary | ICD-10-CM

## 2016-07-25 DIAGNOSIS — Z791 Long term (current) use of non-steroidal anti-inflammatories (NSAID): Secondary | ICD-10-CM | POA: Diagnosis not present

## 2016-07-25 DIAGNOSIS — R519 Headache, unspecified: Secondary | ICD-10-CM

## 2016-07-25 DIAGNOSIS — E039 Hypothyroidism, unspecified: Secondary | ICD-10-CM | POA: Insufficient documentation

## 2016-07-25 DIAGNOSIS — I10 Essential (primary) hypertension: Secondary | ICD-10-CM | POA: Diagnosis not present

## 2016-07-25 DIAGNOSIS — F13930 Sedative, hypnotic or anxiolytic use, unspecified with withdrawal, uncomplicated: Secondary | ICD-10-CM

## 2016-07-25 DIAGNOSIS — R51 Headache: Secondary | ICD-10-CM

## 2016-07-25 DIAGNOSIS — F13230 Sedative, hypnotic or anxiolytic dependence with withdrawal, uncomplicated: Secondary | ICD-10-CM

## 2016-07-25 DIAGNOSIS — G4700 Insomnia, unspecified: Secondary | ICD-10-CM

## 2016-07-25 DIAGNOSIS — Z79899 Other long term (current) drug therapy: Secondary | ICD-10-CM | POA: Insufficient documentation

## 2016-07-25 LAB — BASIC METABOLIC PANEL
Anion gap: 8 (ref 5–15)
BUN: 11 mg/dL (ref 6–20)
CO2: 29 mmol/L (ref 22–32)
Calcium: 9.1 mg/dL (ref 8.9–10.3)
Chloride: 102 mmol/L (ref 101–111)
Creatinine, Ser: 0.66 mg/dL (ref 0.44–1.00)
GFR calc Af Amer: >60 mL/min (ref >60)
GFR calc non Af Amer: >60 mL/min (ref >60)
Glucose, Bld: 95 mg/dL (ref 65–99)
Potassium: 3.5 mmol/L (ref 3.5–5.1)
Sodium: 139 mmol/L (ref 135–145)

## 2016-07-25 LAB — CBC
HCT: 43.9 % (ref 35.0–47.0)
Hemoglobin: 14.4 g/dL (ref 12.0–16.0)
MCH: 30.2 pg (ref 26.0–34.0)
MCHC: 32.8 g/dL (ref 32.0–36.0)
MCV: 92 fL (ref 80.0–100.0)
Platelets: 267 10*3/uL (ref 150–440)
RBC: 4.77 MIL/uL (ref 3.80–5.20)
RDW: 12.7 % (ref 11.5–14.5)
WBC: 6.6 10*3/uL (ref 3.6–11.0)

## 2016-07-25 LAB — TROPONIN I: Troponin I: <0.03 ng/mL (ref <0.03)

## 2016-07-25 MED ORDER — BUTALBITAL-APAP-CAFFEINE 50-325-40 MG PO TABS: 1.0000 | ORAL_TABLET | Freq: Four times a day (QID) | ORAL | 0 refills | Status: DC | PRN

## 2016-07-25 MED ORDER — LAMOTRIGINE 150 MG PO TABS: 150.0000 mg | ORAL_TABLET | Freq: Every day | ORAL | 2 refills | Status: DC

## 2016-07-25 MED ORDER — BUSPIRONE HCL 10 MG PO TABS: 10.0000 mg | ORAL_TABLET | Freq: Two times a day (BID) | ORAL | 1 refills | Status: DC

## 2016-07-25 MED ORDER — TRAZODONE HCL 100 MG PO TABS: 100.0000 mg | ORAL_TABLET | Freq: Every day | ORAL | 1 refills | Status: DC

## 2016-07-25 MED ORDER — DESVENLAFAXINE SUCCINATE ER 100 MG PO TB24: 100.0000 mg | ORAL_TABLET | Freq: Every day | ORAL | 2 refills | Status: DC

## 2016-07-25 NOTE — ED Triage Notes (Signed)
Hypertensive at [facility] office today. Headache and "can't get a deep breath" X 1 week. Pt hx of hypertension. Pt alert and oriented X4, active, cooperative, pt in NAD. RR even and unlabored, color WNL.

## 2016-07-25 NOTE — Progress Notes (Signed)
BH MD/PA/NP OP Progress Note  07/25/2016 8:55 AM Toni Green  MRN:  [id number]  Subjective:    Patient is a 55 year old married female who presented for follow-up accompanied by her husband. She reported that she has been feeling anxious and has been having severe headaches for the past 2 weeks. She reported that she is having premenopausal symptoms. She feels sweaty and hot at night. She has been running 3 fans at night. Her husband also agreed with the same. Patient reported that she feels that her medications are working fine and she has been taking several different vitamins. We discussed about her medications. Patient reported that she is already out of her BuSpar as she has taking all 180 pills due to severe anxiety. She continues to have issues with taking more than the amount  prescribed of the medications. We discussed about her medication compliance and she agreed with the plan. She does not want to take change any of her medications at this time. We discussed about getting a pillbox and her husband will monitor her medications on a regular basis. She appeared calm and collective during the interview.  Patient started crying when we discussed about changing the providers as I told her that I will be departing  from this practice at the end of next month. She does not want to change providers as she has a good relationship with me. We discussed this at length. She will follow-up next month to have her medications adjusted.     She denied having any suicidal ideation or plans.  She reported that she started eating healthy on a regular basis.  Her husband remains supportive.      Chief Complaint:  Chief Complaint    Follow-up; Medication Refill     Visit Diagnosis:   No diagnosis found.  Past Medical History:  Past Medical History:  Diagnosis Date  . Anxiety   . Depression   . Hypertension   . Thyroid disease     Past Surgical History:  Procedure Laterality Date  .  ABDOMINAL HYSTERECTOMY    . APPENDECTOMY    . GALLBLADDER SURGERY    . GASTRIC BYPASS  2008   Family History:  Family History  Problem Relation Age of Onset  . Depression Mother   . Hypertension Mother   . Depression Father   . Hypertension Father   . Depression Sister   . Hypertension Maternal Grandfather   . Depression Maternal Grandmother   . Hypertension Maternal Grandmother   . Hypertension Paternal Grandfather   . Hypertension Paternal Grandmother    Social History:  Social History   Social History  . Marital status: Married    Spouse name: N/A  . Number of children: N/A  . Years of education: N/A   Social History Main Topics  . Smoking status: Never Smoker  . Smokeless tobacco: Never Used  . Alcohol use No  . Drug use: No  . Sexual activity: No   Other Topics Concern  . None   Social History Narrative  . None   Additional History:  Lives with her husband and has been working full-time in [place]. She denied use of drugs or alcohol at this time.  Assessment:   Musculoskeletal: Strength & Muscle Tone: within normal limits Gait & Station: normal Patient leans: N/A  Psychiatric Specialty Exam: Medication Refill  Pertinent negatives include no neck pain.  Anxiety  Symptoms include insomnia and nervous/anxious behavior. Patient reports no suicidal ideas.  Review of Systems  Musculoskeletal: Negative for neck pain.  Psychiatric/Behavioral: Negative for depression, hallucinations, memory loss and suicidal ideas. Substance abuse: overuse of Klonopin as discussed above. The patient is nervous/anxious and has insomnia.     Blood pressure (!) 173/108, pulse 65, temperature 98.3 F (36.8 C), temperature source Oral, weight 179 lb 9.6 oz (81.5 kg).Body mass index is 26.52 kg/m.  General Appearance: Fairly Groomed  Eye Contact:  Fair  Speech:  Normal Rate  Volume:  Normal  Mood:  Euthymic  Affect:  Congruent and Tearful  Thought Process:  Coherent   Orientation:  Full (Time, Place, and Person)  Thought Content:  WDL  Suicidal Thoughts:  No  Homicidal Thoughts:  No  Memory:  Immediate;   Fair  Judgement:  Fair  Insight:  Fair  Psychomotor Activity:  Normal  Concentration:  Fair  Recall:  [employer] of Knowledge: Fair  Language: Fair  Akathisia:  No  Handed:  Right  AIMS (if indicated):  Assets:  Communication Skills Desire for Improvement Physical Health Social Support  ADL's:  Intact  Cognition: WNL  Sleep:     Is the patient at risk to self?  No. Has the patient been a risk to self in the past 6 months?  No. Has the patient been a risk to self within the distant past?  No. Is the patient a risk to others?  No. Has the patient been a risk to others in the past 6 months?  No. Has the patient been a risk to others within the distant past?  No.  Current Medications: Current Outpatient Prescriptions  Medication Sig Dispense Refill  . busPIRone (BUSPAR) 15 MG tablet Take 1 tablet (15 mg total) by mouth 2 (two) times daily. 180 tablet 1  . desvenlafaxine (PRISTIQ) 100 MG 24 hr tablet Take 1 tablet (100 mg total) by mouth daily. 90 tablet 2  . furosemide (LASIX) 20 MG tablet TAKE 1 TABLET DAILY 90 tablet 3  . HYDROcodone-acetaminophen (NORCO/VICODIN) 5-325 MG tablet     . hydrOXYzine (VISTARIL) 25 MG capsule Take 1 capsule by mouth 2 (two) times daily.    Marland Kitchen lamoTRIgine (LAMICTAL) 150 MG tablet Take 1 tablet (150 mg total) by mouth daily. 90 tablet 2  . levothyroxine (SYNTHROID) 50 MCG tablet Take 1 tablet (50 mcg total) by mouth daily before breakfast. 90 tablet 3  . prazosin (MINIPRESS) 5 MG capsule Take 1 capsule (5 mg total) by mouth 2 (two) times daily. (Patient taking differently: Take 5 mg by mouth at bedtime. ) 180 capsule 3  . tamsulosin (FLOMAX) 0.4 MG CAPS capsule Take 0.4 mg by mouth.    . traZODone (DESYREL) 100 MG tablet Take 1 tablet (100 mg total) by mouth at bedtime. 90 tablet 1  . vitamin B-12  (CYANOCOBALAMIN) 250 MCG tablet Take 1 tablet (250 mcg total) by mouth daily.     No current facility-administered medications for this visit.     Medical Decision Making:  Review of Psycho-Social Stressors (1) and Review of Last Therapy Session (1)  Treatment Plan Summary:Medication management    Depression She will continue on Pristiq 100 mg daily.  Mood symptoms Patient will continue on lamotrigine 150 mg daily.  I will start her on BuSpar 10 mg by mouth twice a day to help with her anxiety symptoms. It will be sent to the local pharmacy and her husband will monitor her medications. She will continue on trazodone at bedtime. She'll follow-up in one month  or earlier depending on her symptoms.  Advised patient that I will be leaving this practice next month and she became sad and tearful. She will follow up next month for my departure.   She will follow-up in 1 months or earlier depending on her symptoms  Patient is taking trazodone and melatonin to help with the sleep.    More than 50% of the time spent in psychoeducation, counseling and coordination of care.  time spent with the patient 25 minutes   This note was generated in part or whole with voice recognition software. Voice regonition is usually quite accurate but there are transcription errors that can and very often do occur. I apologize for any typographical errors that were not detected and corrected.        Rainey Pines, MD    07/25/2016, 8:55 AM

## 2016-07-25 NOTE — ED Notes (Signed)
Pt upset about leaving with BP high, pt reassured that MD feels safe about discharge. MD explained to pt prior to d/c. Pt states she feels upset. RN educated pt on reasons for HTN in the ED. MD notified

## 2016-07-25 NOTE — ED Provider Notes (Signed)
[facility] Emergency Department Provider Note        Time seen: ----------------------------------------- 4:20 PM on 07/25/2016 -----------------------------------------    I have reviewed the triage vital signs and the nursing notes.   HISTORY  Chief Complaint Hypertension and Shortness of Breath    HPI Toni Green is a 55 y.o. female who presents to ER for headache, feeling like she couldn't take a deep breath for the last week, poor sleep and recently noted hypertension. Patient has not checked her blood pressure until she had follow-up with her doctor today who noted her blood pressure was elevated. She states she's not been sleeping well at night, also has diffuse headache that is not been debilitating but has been persistent. She does not have pain when she breathes she just feels like she can't take a deep breath. She denies recent illness or other complaints.   Past Medical History:  Diagnosis Date  . Anxiety   . Depression   . Hypertension   . Thyroid disease     Patient Active Problem List   Diagnosis Date Noted  . Hypothyroidism 05/25/2016  . Essential (primary) hypertension 07/20/2015  . 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 disorder 07/20/2015  . 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 Severe episode of recurrent major depressive disorder, without psychotic features ([place]) 07/20/2015  . Borderline personality disorder 04/23/2015  . 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 Complication of surgery [id number]  . Bursitis, trochanteric 10/01/2014  . Inflammation of sacroiliac joint ([place]) 08/22/2014  . 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 DDD (degenerative disc disease), cervical 03/17/2014  . 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 Hyperoxaluria ([place]) 08/15/2013  . Calculus of kidney 02/13/2013    Past Surgical History:  Procedure Laterality Date  . ABDOMINAL HYSTERECTOMY    . APPENDECTOMY    .  GALLBLADDER SURGERY    . GASTRIC BYPASS  2008    Allergies Losartan; Oxycodone-acetaminophen; and Penicillins  Social History Social History  Substance Use Topics  . Smoking status: Never Smoker  . Smokeless tobacco: Never Used  . Alcohol use No    Review of Systems Constitutional: Negative for fever. Cardiovascular: Negative for chest pain. Respiratory: Negative for shortness of breath. Gastrointestinal: Negative for abdominal pain, vomiting and diarrhea. Genitourinary: Negative for dysuria. Musculoskeletal: Negative for back pain. Skin: Negative for rash. Neurological: Positive for headache  10-point ROS otherwise negative.  ____________________________________________   PHYSICAL EXAM:  VITAL SIGNS: ED Triage Vitals  Enc Vitals Group     BP 07/25/16 1143 (!) 193/110     Pulse Rate 07/25/16 1143 65     Resp 07/25/16 1143 18     Temp 07/25/16 1143 98.1 F (36.7 C)     Temp Source 07/25/16 1143 Oral     SpO2 07/25/16 1143 100 %     Weight 07/25/16 1144 179 lb (81.2 kg)     Height 07/25/16 1144 5\' 10"  (1.778 m)     Head Circumference --      Peak Flow --      Pain Score 07/25/16 1144 7     Pain Loc --      Pain Edu? --      Excl. in [place]? --     Constitutional: Alert and oriented. Well appearing and in no distress. Eyes: Conjunctivae are normal. PERRL. Normal extraocular movements. ENT   Head: Normocephalic and atraumatic.   Nose: No congestion/rhinnorhea.   Mouth/Throat: Mucous membranes are moist.  Neck: No stridor. Cardiovascular: Normal rate, regular rhythm. No murmurs, rubs, or gallops. Respiratory: Normal respiratory effort without tachypnea nor retractions. Breath sounds are clear and equal bilaterally. No wheezes/rales/rhonchi. Gastrointestinal: Soft and nontender. Normal bowel sounds Musculoskeletal: Nontender with normal range of motion in all extremities. No lower extremity tenderness nor edema. Neurologic:  Normal speech and  language. No gross focal neurologic deficits are appreciated.  Skin:  Skin is warm, dry and intact. No rash noted. Psychiatric: Mood and affect are normal. Speech and behavior are normal.  ____________________________________________  EKG: Interpreted by me.Sinus rhythm rate of 60 bpm, normal PR interval, normal QRS, normal QT interval. Normal axis.  ____________________________________________  ED COURSE:  Pertinent labs & imaging results that were available during my care of the patient were reviewed by me and considered in my medical decision making (see chart for details). Clinical Course  Patient is in no acute distress, we will assess with basic labs and reevaluate. She has essentially asymptomatic hypertension.  Procedures ____________________________________________   LABS (pertinent positives/negatives)  Labs Reviewed  BASIC METABOLIC PANEL  CBC  TROPONIN I  ____________________________________________  FINAL ASSESSMENT AND PLAN  Hypertension  Plan: Patient with labs as dictated above. Patient is in no distress, symptoms are likely secondary to poor sleep which has lead to headaches and subsequent hypertension. I will not recommend an increase in her blood pressure medications until her sleep and headaches have improved. One month ago her blood pressure was normal. She stable for outpatient follow-up with her doctor in 1 week for recheck.   Earleen Newport, MD   Note: This dictation was prepared with Dragon dictation. Any transcriptional errors that result from this process are unintentional    Earleen Newport, MD 07/25/16 1625

## 2016-07-26 ENCOUNTER — Encounter: Payer: Self-pay | Admitting: Internal Medicine

## 2016-07-26 ENCOUNTER — Ambulatory Visit (INDEPENDENT_AMBULATORY_CARE_PROVIDER_SITE_OTHER): Payer: BLUE CROSS/BLUE SHIELD | Admitting: Internal Medicine

## 2016-07-26 VITALS — BP 162/98 | HR 71 | Resp 16 | Ht 70.0 in | Wt 174.0 lb

## 2016-07-26 DIAGNOSIS — I10 Essential (primary) hypertension: Secondary | ICD-10-CM | POA: Diagnosis not present

## 2016-07-26 DIAGNOSIS — N951 Menopausal and female climacteric states: Secondary | ICD-10-CM

## 2016-07-26 MED ORDER — IRBESARTAN 150 MG PO TABS: 150.0000 mg | ORAL_TABLET | Freq: Every day | ORAL | 1 refills | Status: DC

## 2016-07-26 NOTE — Progress Notes (Signed)
Date:  07/26/2016   Name:  Toni Green   DOB:  September 24, 1961   MRN:  [id number]   Chief Complaint: Hypertension (208/112 yesterday then ER sent home with 173/120 BP. ) Hypertension  This is a chronic problem. The current episode started more than 1 year ago. The problem has been waxing and waning since onset. The problem is resistant. Associated symptoms include headaches. Pertinent negatives include no chest pain, palpitations or shortness of breath.  She was on several other BP meds before her gastric bypass surgery.  Avalide and Norvasc sound familiar to her.  She has had a headache over the past week and taking 3 or more BC powders daily.  Took zyrtec (plain) yesterday for PND.  No sudafed or other stimulants besides the BC powder caffeine.  Menopause - s/p partial hysterectomy.  Over the past few months have sweats at night and severe hot flashes during the day.  Has not tried any supplements.  Psychiatric medications have not changed.  BP Readings from Last 3 Encounters:  07/26/16 (!) 188/94  07/25/16 (!) 167/120  07/25/16 (!) 173/108      Review of Systems  Constitutional: Positive for diaphoresis. Negative for chills, fatigue, fever and unexpected weight change.  HENT: Positive for congestion and postnasal drip.   Respiratory: Negative for cough, chest tightness, shortness of breath and wheezing.   Cardiovascular: Negative for chest pain, palpitations and leg swelling.  Gastrointestinal: Negative for abdominal pain.  Neurological: Positive for headaches. Negative for dizziness, tremors, syncope and numbness.  Hematological: Negative for adenopathy.  Psychiatric/Behavioral: Negative for dysphoric mood and sleep disturbance.    Patient Active Problem List   Diagnosis Date Noted  . Menopause syndrome 07/26/2016  . Hypothyroidism 05/25/2016  . Essential (primary) hypertension 07/20/2015  . 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 disorder 07/20/2015  . Agoraphobia with panic  disorder 07/20/2015  . Severe episode of recurrent major depressive disorder, without psychotic features ([place]) 07/20/2015  . Borderline personality disorder 04/23/2015  . 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 Complication of surgery [id number]  . Bursitis, trochanteric 10/01/2014  . Inflammation of sacroiliac joint ([place]) 08/22/2014  . 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 DDD (degenerative disc disease), cervical 03/17/2014  . 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 Hyperoxaluria ([place]) 08/15/2013  . Calculus of kidney 02/13/2013    Prior to Admission medications   Medication Sig Start Date End Date Taking? Authorizing Provider  busPIRone (BUSPAR) 10 MG tablet Take 1 tablet (10 mg total) by mouth 2 (two) times daily. 07/25/16  Yes Rainey Pines, MD  desvenlafaxine (PRISTIQ) 100 MG 24 hr tablet Take 1 tablet (100 mg total) by mouth daily. 07/25/16  Yes Rainey Pines, MD  furosemide (LASIX) 20 MG tablet TAKE 1 TABLET DAILY 02/12/16  Yes Glean Hess, MD  HYDROcodone-acetaminophen (NORCO/VICODIN) 5-325 MG tablet  03/08/16  Yes Historical Provider, MD  lamoTRIgine (LAMICTAL) 150 MG tablet Take 1 tablet (150 mg total) by mouth daily. 07/25/16  Yes Rainey Pines, MD  levothyroxine (SYNTHROID) 50 MCG tablet Take 1 tablet (50 mcg total) by mouth daily before breakfast. 05/25/16  Yes Glean Hess, MD  prazosin (MINIPRESS) 5 MG capsule Take 1 capsule (5 mg total) by mouth 2 (two) times daily. Patient taking differently: Take 5 mg by mouth at bedtime.  06/11/15  Yes Glean Hess, MD  tamsulosin (FLOMAX) 0.4 MG CAPS capsule Take 0.4 mg by mouth as needed.  06/28/13  Yes Historical Provider, MD  traZODone (DESYREL) 100 MG tablet Take 1 tablet (100 mg total) by mouth at bedtime. 07/25/16  Yes Rainey Pines, MD  vitamin B-12 (CYANOCOBALAMIN) 250 MCG tablet Take 1 tablet (250 mcg total) by mouth daily. 02/23/16  Yes Rainey Pines, MD    Allergies  Allergen Reactions  . Losartan   .  Oxycodone-Acetaminophen Other (See Comments)    Other reaction(s): ITCHING  . Penicillins Rash    Past Surgical History:  Procedure Laterality Date  . ABDOMINAL HYSTERECTOMY    . APPENDECTOMY    . GALLBLADDER SURGERY    . GASTRIC BYPASS  2008    Social History  Substance Use Topics  . Smoking status: Never Smoker  . Smokeless tobacco: Never Used  . Alcohol use No     Medication list has been reviewed and updated.   Physical Exam  Constitutional: She is oriented to person, place, and time. She appears well-developed. No distress.  HENT:  Head: Normocephalic and atraumatic.  Neck: Normal range of motion. Neck supple. No thyromegaly present.  Cardiovascular: Normal rate, regular rhythm and normal heart sounds.   Pulmonary/Chest: Effort normal and breath sounds normal. No respiratory distress. She has no wheezes. She has no rales. She exhibits no tenderness.  Musculoskeletal: She exhibits no edema or tenderness.  Neurological: She is alert and oriented to person, place, and time.  Skin: Skin is warm and dry. No rash noted.  Psychiatric: She has a normal mood and affect. Her behavior is normal. Thought content normal.  Nursing note and vitals reviewed.   BP (!) 188/94   Pulse 71   Resp 16   Ht 5\' 10"  (1.778 m)   Wt 174 lb (78.9 kg)   BMI 24.97 kg/m   Assessment and Plan: 1. Essential (primary) hypertension Continue Prazosin once daily Continue Lasix PRN - irbesartan (AVAPRO) 150 MG tablet; Take 1 tablet (150 mg total) by mouth daily.  Dispense: 30 tablet; Refill: 1  2. Menopause syndrome Try Soy and/or Black cohosh Consider HRT if needed   Halina Maidens, MD [facility]  07/26/2016

## 2016-07-26 NOTE — Patient Instructions (Signed)
Black Cohosh or Soy [place])

## 2016-08-26 ENCOUNTER — Ambulatory Visit (INDEPENDENT_AMBULATORY_CARE_PROVIDER_SITE_OTHER): Payer: BLUE CROSS/BLUE SHIELD | Admitting: Psychiatry

## 2016-08-26 ENCOUNTER — Encounter: Payer: Self-pay | Admitting: Internal Medicine

## 2016-08-26 ENCOUNTER — Encounter: Payer: Self-pay | Admitting: Psychiatry

## 2016-08-26 ENCOUNTER — Ambulatory Visit (INDEPENDENT_AMBULATORY_CARE_PROVIDER_SITE_OTHER): Payer: BLUE CROSS/BLUE SHIELD | Admitting: Internal Medicine

## 2016-08-26 VITALS — BP 122/80 | HR 67 | Ht 70.0 in | Wt 185.2 lb

## 2016-08-26 DIAGNOSIS — F411 Generalized anxiety disorder: Secondary | ICD-10-CM | POA: Diagnosis not present

## 2016-08-26 DIAGNOSIS — F331 Major depressive disorder, recurrent, moderate: Secondary | ICD-10-CM | POA: Diagnosis not present

## 2016-08-26 DIAGNOSIS — I10 Essential (primary) hypertension: Secondary | ICD-10-CM | POA: Diagnosis not present

## 2016-08-26 MED ORDER — IRBESARTAN 150 MG PO TABS: 150.0000 mg | ORAL_TABLET | Freq: Every day | ORAL | 3 refills | Status: DC

## 2016-08-26 MED ORDER — PRAZOSIN HCL 5 MG PO CAPS: 5.0000 mg | ORAL_CAPSULE | Freq: Every day | ORAL | 3 refills | Status: DC

## 2016-08-26 MED ORDER — BUSPIRONE HCL 10 MG PO TABS: 10.0000 mg | ORAL_TABLET | Freq: Two times a day (BID) | ORAL | 1 refills | Status: DC

## 2016-08-26 NOTE — Progress Notes (Signed)
Date:  08/26/2016   Name:  Toni Green   DOB:  [birth year]   MRN:  [id number]   Chief Complaint: Follow-up (htn) Hypertension  This is a chronic problem. The current episode started more than 1 year ago. The problem has been gradually improving since onset. The problem is controlled. Pertinent negatives include no chest pain, headaches, palpitations or shortness of breath. Past treatments include angiotensin blockers and central alpha agonists.  Doing well since adding Avapro.  Initially nauseated but that has resolved.  Noted some low BP right after taking medication so changed dosing to bedtime.    Review of Systems  Constitutional: Negative for fatigue and fever.  Eyes: Negative for visual disturbance.  Respiratory: Negative for cough, chest tightness and shortness of breath.   Cardiovascular: Negative for chest pain, palpitations and leg swelling.  Gastrointestinal: Negative for abdominal pain.  Endocrine: Negative for polydipsia and polyuria.  Genitourinary: Negative for dysuria and hematuria.  Musculoskeletal: Negative for arthralgias.  Neurological: Negative for dizziness, tremors, numbness and headaches.  Psychiatric/Behavioral: Negative for dysphoric mood.    Patient Active Problem List   Diagnosis Date Noted  . Menopause syndrome 07/26/2016  . Hypothyroidism 05/25/2016  . Essential (primary) hypertension 07/20/2015  . 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 disorder 07/20/2015  . 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 Severe episode of recurrent major depressive disorder, without psychotic features ([place]) 07/20/2015  . Borderline personality disorder 04/23/2015  . 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 Complication of surgery [id number]  . Bursitis, trochanteric 10/01/2014  . Inflammation of sacroiliac joint ([place]) 08/22/2014  . 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 DDD (degenerative disc disease), cervical 03/17/2014  . Arthropathy of cervical  facet joint 03/17/2014  . Hyperoxaluria ([place]) 08/15/2013  . Calculus of kidney 02/13/2013    Prior to Admission medications   Medication Sig Start Date End Date Taking? Authorizing Provider  busPIRone (BUSPAR) 10 MG tablet Take 1 tablet (10 mg total) by mouth 2 (two) times daily. 08/26/16  Yes Rainey Pines, MD  desvenlafaxine (PRISTIQ) 100 MG 24 hr tablet Take 1 tablet (100 mg total) by mouth daily. 07/25/16  Yes Rainey Pines, MD  furosemide (LASIX) 20 MG tablet TAKE 1 TABLET DAILY 02/12/16  Yes Glean Hess, MD  HYDROcodone-acetaminophen (NORCO/VICODIN) 5-325 MG tablet  03/08/16  Yes Historical Provider, MD  irbesartan (AVAPRO) 150 MG tablet Take 1 tablet (150 mg total) by mouth daily. 07/26/16  Yes Glean Hess, MD  lamoTRIgine (LAMICTAL) 150 MG tablet Take 1 tablet (150 mg total) by mouth daily. 07/25/16  Yes Rainey Pines, MD  levothyroxine (SYNTHROID) 50 MCG tablet Take 1 tablet (50 mcg total) by mouth daily before breakfast. 05/25/16  Yes Glean Hess, MD  prazosin (MINIPRESS) 5 MG capsule Take 1 capsule (5 mg total) by mouth 2 (two) times daily. Patient taking differently: Take 5 mg by mouth at bedtime.  06/11/15  Yes Glean Hess, MD  traZODone (DESYREL) 100 MG tablet Take 1 tablet (100 mg total) by mouth at bedtime. 07/25/16  Yes Rainey Pines, MD  vitamin B-12 (CYANOCOBALAMIN) 250 MCG tablet Take 1 tablet (250 mcg total) by mouth daily. 02/23/16  Yes Rainey Pines, MD  tamsulosin (FLOMAX) 0.4 MG CAPS capsule Take 0.4 mg by mouth as needed.  06/28/13   Historical Provider, MD    Allergies  Allergen Reactions  . Losartan   . Oxycodone-Acetaminophen Other (See Comments)    Other reaction(s): ITCHING  . Penicillins Rash  Past Surgical History:  Procedure Laterality Date  . ABDOMINAL HYSTERECTOMY    . APPENDECTOMY    . GALLBLADDER SURGERY    . GASTRIC BYPASS  2008    Social History  Substance Use Topics  . Smoking status: Never Smoker  . Smokeless tobacco: Never Used  .  Alcohol use No     Medication list has been reviewed and updated.   Physical Exam  Constitutional: She is oriented to person, place, and time. She appears well-developed. No distress.  HENT:  Head: Normocephalic and atraumatic.  Neck: Normal range of motion. No thyromegaly present.  Cardiovascular: Normal rate, regular rhythm and normal heart sounds.   Pulmonary/Chest: Effort normal and breath sounds normal. No respiratory distress. She has no wheezes.  Musculoskeletal: Normal range of motion.  Neurological: She is alert and oriented to person, place, and time.  Skin: Skin is warm and dry. No rash noted.  Psychiatric: She has a normal mood and affect. Her behavior is normal. Thought content normal.  Nursing note and vitals reviewed.   BP 124/84   Pulse 70   Ht 5\' 10"  (1.778 m)   Wt 185 lb 9.6 oz (84.2 kg)   SpO2 97%   BMI 26.63 kg/m   Assessment and Plan: 1. Essential (primary) hypertension Much improved - continue current regimen - irbesartan (AVAPRO) 150 MG tablet; Take 1 tablet (150 mg total) by mouth daily.  Dispense: 90 tablet; Refill: 3 - prazosin (MINIPRESS) 5 MG capsule; Take 1 capsule (5 mg total) by mouth at bedtime.  Dispense: 90 capsule; Refill: [place], MD [facility]  08/26/2016

## 2016-08-26 NOTE — Progress Notes (Signed)
BH MD/PA/NP OP Progress Note  08/26/2016 9:23 AM Toni Green  MRN:  [id number]  Subjective:    Patient is a 55 year old married female who presented for follow-up.  She reported that her husband is sick today due to strep throat. Patient reported that she has been feeling better on the current combination of medications. Her blood pressure was significantly elevated and she had to go to the emergency room. She reported that she started a new medication to control her blood pressure and it was making her sick so she has started taking the BuSpar in the evening with her blood pressure medication. Patient reported that her medications are helping her. She currently denied having any dizziness at this time. She has been compliant with her medication and brought her medication bottles with her. Patient stated that she is also trying to lose weight. She appeared well groomed.  We discussed about her medications. She appears calm and alert.    She denied having any suicidal ideation or plans.  She reported that she started eating healthy on a regular basis.       Chief Complaint:  Chief Complaint    Follow-up     Visit Diagnosis:     ICD-9-CM ICD-10-CM   1. MDD (major depressive disorder), recurrent episode, moderate (HCC) 296.32 F33.1   2. Anxiety state 300.00 F41.1     Past Medical History:  Past Medical History:  Diagnosis Date  . Anxiety   . Depression   . Hypertension   . Thyroid disease     Past Surgical History:  Procedure Laterality Date  . ABDOMINAL HYSTERECTOMY    . APPENDECTOMY    . GALLBLADDER SURGERY    . GASTRIC BYPASS  2008   Family History:  Family History  Problem Relation Age of Onset  . Depression Mother   . Hypertension Mother   . Depression Father   . Hypertension Father   . Depression Sister   . Hypertension Maternal Grandfather   . Depression Maternal Grandmother   . Hypertension Maternal Grandmother   . Hypertension Paternal Grandfather    . Hypertension Paternal Grandmother    Social History:  Social History   Social History  . Marital status: Married    Spouse name: N/A  . Number of children: N/A  . Years of education: N/A   Social History Main Topics  . Smoking status: Never Smoker  . Smokeless tobacco: Never Used  . Alcohol use No  . Drug use: No  . Sexual activity: Not Currently   Other Topics Concern  . None   Social History Narrative  . None   Additional History:  Lives with her husband and has been working full-time in [place]. She denied use of drugs or alcohol at this time.  Assessment:   Musculoskeletal: Strength & Muscle Tone: within normal limits Gait & Station: normal Patient leans: N/A  Psychiatric Specialty Exam: Medication Refill  Pertinent negatives include no neck pain.  Anxiety  Symptoms include insomnia and nervous/anxious behavior. Patient reports no suicidal ideas.      Review of Systems  Musculoskeletal: Negative for neck pain.  Psychiatric/Behavioral: Negative for depression, hallucinations, memory loss and suicidal ideas. Substance abuse: overuse of Klonopin as discussed above. The patient is nervous/anxious and has insomnia.     Blood pressure 122/80, pulse 67, height 5\' 10"  (1.778 m), weight 185 lb 3.2 oz (84 kg).Body mass index is 26.57 kg/m.  General Appearance: Fairly Groomed  Eye Contact:  Fair  Speech:  Normal Rate  Volume:  Normal  Mood:  Euthymic  Affect:  Congruent  Thought Process:  Coherent  Orientation:  Full (Time, Place, and Person)  Thought Content:  WDL  Suicidal Thoughts:  No  Homicidal Thoughts:  No  Memory:  Immediate;   Fair  Judgement:  Fair  Insight:  Fair  Psychomotor Activity:  Normal  Concentration:  Fair  Recall:  [employer] of Knowledge: Fair  Language: Fair  Akathisia:  No  Handed:  Right  AIMS (if indicated):  Assets:  Communication Skills Desire for Improvement Physical Health Social Support  ADL's:  Intact  Cognition:  WNL  Sleep:     Is the patient at risk to self?  No. Has the patient been a risk to self in the past 6 months?  No. Has the patient been a risk to self within the distant past?  No. Is the patient a risk to others?  No. Has the patient been a risk to others in the past 6 months?  No. Has the patient been a risk to others within the distant past?  No.  Current Medications: Current Outpatient Prescriptions  Medication Sig Dispense Refill  . busPIRone (BUSPAR) 10 MG tablet Take 1 tablet (10 mg total) by mouth 2 (two) times daily. 180 tablet 1  . desvenlafaxine (PRISTIQ) 100 MG 24 hr tablet Take 1 tablet (100 mg total) by mouth daily. 90 tablet 2  . furosemide (LASIX) 20 MG tablet TAKE 1 TABLET DAILY 90 tablet 3  . HYDROcodone-acetaminophen (NORCO/VICODIN) 5-325 MG tablet     . irbesartan (AVAPRO) 150 MG tablet Take 1 tablet (150 mg total) by mouth daily. 30 tablet 1  . lamoTRIgine (LAMICTAL) 150 MG tablet Take 1 tablet (150 mg total) by mouth daily. 90 tablet 2  . levothyroxine (SYNTHROID) 50 MCG tablet Take 1 tablet (50 mcg total) by mouth daily before breakfast. 90 tablet 3  . prazosin (MINIPRESS) 5 MG capsule Take 1 capsule (5 mg total) by mouth 2 (two) times daily. (Patient taking differently: Take 5 mg by mouth at bedtime. ) 180 capsule 3  . tamsulosin (FLOMAX) 0.4 MG CAPS capsule Take 0.4 mg by mouth as needed.     . traZODone (DESYREL) 100 MG tablet Take 1 tablet (100 mg total) by mouth at bedtime. 90 tablet 1  . vitamin B-12 (CYANOCOBALAMIN) 250 MCG tablet Take 1 tablet (250 mcg total) by mouth daily.     No current facility-administered medications for this visit.     Medical Decision Making:  Review of Psycho-Social Stressors (1) and Review of Last Therapy Session (1)  Treatment Plan Summary:Medication management    Depression She will continue on Pristiq 100 mg daily.  Mood symptoms Patient will continue on lamotrigine 150 mg daily.  Continue  BuSpar 10 mg by mouth twice  a day to help with her anxiety symptoms.  She will continue on trazodone at bedtime.  Follow-up in 2 months or earlier depending on her symptoms      More than 50% of the time spent in psychoeducation, counseling and coordination of care.    This note was generated in part or whole with voice recognition software. Voice regonition is usually quite accurate but there are transcription errors that can and very often do occur. I apologize for any typographical errors that were not detected and corrected.        Rainey Pines, MD    08/26/2016, 9:23 AM

## 2016-09-03 ENCOUNTER — Encounter: Payer: Self-pay | Admitting: Emergency Medicine

## 2016-09-03 ENCOUNTER — Ambulatory Visit
Admission: EM | Admit: 2016-09-03 | Discharge: 2016-09-03 | Disposition: A | Discharge status: Home / Self Care | Payer: BLUE CROSS/BLUE SHIELD | Attending: Family Medicine | Admitting: Family Medicine

## 2016-09-03 DIAGNOSIS — J029 Acute pharyngitis, unspecified: Secondary | ICD-10-CM | POA: Diagnosis not present

## 2016-09-03 DIAGNOSIS — J069 Acute upper respiratory infection, unspecified: Secondary | ICD-10-CM | POA: Diagnosis not present

## 2016-09-03 LAB — RAPID STREP SCREEN (MED CTR MEBANE ONLY): Streptococcus, Group A Screen (Direct): NEGATIVE

## 2016-09-03 MED ORDER — AZITHROMYCIN 250 MG PO TABS: 250.0000 mg | ORAL_TABLET | Freq: Every day | ORAL | 0 refills | Status: AC

## 2016-09-03 NOTE — ED Triage Notes (Signed)
Patient c/o runny nose, sinus pressure, scratchy throat and ear pressure that started yesterday. Patient denies fevers.

## 2016-09-03 NOTE — Discharge Instructions (Signed)
Your strep test is negative. We will only call you if your throat culture comes back positive.  I also don't believe you have sinus infection, however I understand your concern, I will give you an antibiotic to take if you get worst or if you do not improve.

## 2016-09-03 NOTE — ED Provider Notes (Signed)
CSN: [id number]     Arrival date & time 09/03/16  [id number] History   First MD Initiated Contact with Patient 09/03/16 1042     Chief Complaint  Patient presents with  . Sore Throat  . Facial Pain   (Consider location/radiation/quality/duration/timing/severity/associated sxs/prior Treatment) Toni Green is a well-appearing 55 y.o female, presents today with concern for both sinus infection and strep. She reports her husband recently had strep and she noticed to have a sore throat onset yesterday that is getting worst. She also reports a possible sinus infection. Patients states that she gets a sinus infection once a year during this time of the year; she wants to catch it early before it gets worst. She reports sinus pain and pressure over bilateral maxillary sinuses. She also endorses congestion, sneezing, coughing and headache. Her sinus pain and pressure started last night as well along with the sore throat. She denies fever at home but have been having chills throughout the night.       Past Medical History:  Diagnosis Date  . Anxiety   . Depression   . Hypertension   . Thyroid disease    Past Surgical History:  Procedure Laterality Date  . ABDOMINAL HYSTERECTOMY    . APPENDECTOMY    . GALLBLADDER SURGERY    . GASTRIC BYPASS  2008   Family History  Problem Relation Age of Onset  . Depression Mother   . Hypertension Mother   . Depression Father   . Hypertension Father   . Depression Sister   . Hypertension Maternal Grandfather   . Depression Maternal Grandmother   . Hypertension Maternal Grandmother   . Hypertension Paternal Grandfather   . Hypertension Paternal Grandmother    Social History  Substance Use Topics  . Smoking status: Never Smoker  . Smokeless tobacco: Never Used  . Alcohol use No   OB History    No data available     Review of Systems  Constitutional: Negative for chills, fatigue and fever.  HENT: Positive for congestion, sinus pain, sinus  pressure, sneezing and sore throat. Negative for ear pain and rhinorrhea.        +ears feels stopped up   Respiratory: Positive for cough. Negative for shortness of breath.   Cardiovascular: Negative for chest pain and palpitations.  Gastrointestinal: Negative for abdominal pain, diarrhea, nausea and vomiting.  Skin: Negative for rash.  Neurological: Positive for headaches. Negative for dizziness.    Allergies  Losartan; Oxycodone-acetaminophen; and Penicillins  Home Medications   Prior to Admission medications   Medication Sig Start Date End Date Taking? Authorizing Provider  azithromycin (ZITHROMAX) 250 MG tablet Take 1 tablet (250 mg total) by mouth daily. Take first 2 tablets together on the first day, then 1 every day until finished. 09/03/16 09/08/16  Barry Dienes, NP  busPIRone (BUSPAR) 10 MG tablet Take 1 tablet (10 mg total) by mouth 2 (two) times daily. 08/26/16   Rainey Pines, MD  desvenlafaxine (PRISTIQ) 100 MG 24 hr tablet Take 1 tablet (100 mg total) by mouth daily. 07/25/16   Rainey Pines, MD  furosemide (LASIX) 20 MG tablet TAKE 1 TABLET DAILY 02/12/16   Glean Hess, MD  HYDROcodone-acetaminophen (NORCO/VICODIN) 5-325 MG tablet  03/08/16   Historical Provider, MD  irbesartan (AVAPRO) 150 MG tablet Take 1 tablet (150 mg total) by mouth daily. 08/26/16   Glean Hess, MD  lamoTRIgine (LAMICTAL) 150 MG tablet Take 1 tablet (150 mg total) by mouth daily. 07/25/16   Cinda Quest  Gretel Acre, MD  levothyroxine (SYNTHROID) 50 MCG tablet Take 1 tablet (50 mcg total) by mouth daily before breakfast. 05/25/16   Glean Hess, MD  prazosin (MINIPRESS) 5 MG capsule Take 1 capsule (5 mg total) by mouth at bedtime. 08/26/16   Glean Hess, MD  tamsulosin (FLOMAX) 0.4 MG CAPS capsule Take 0.4 mg by mouth as needed.  06/28/13   Historical Provider, MD  traZODone (DESYREL) 100 MG tablet Take 1 tablet (100 mg total) by mouth at bedtime. 07/25/16   Rainey Pines, MD  vitamin B-12 (CYANOCOBALAMIN) 250  MCG tablet Take 1 tablet (250 mcg total) by mouth daily. 02/23/16   Rainey Pines, MD   Meds Ordered and Administered this Visit  Medications - No data to display  BP (!) 147/89 (BP Location: Right Arm)   Pulse 68   Temp 97.1 F (36.2 C) (Tympanic)   Resp 16   Ht 5\' 10"  (1.778 m)   Wt 179 lb (81.2 kg)   SpO2 100%   BMI 25.68 kg/m  No data found.   Physical Exam  Constitutional: She is oriented to person, place, and time. She appears well-developed and well-nourished.  HENT:  Head: Normocephalic and atraumatic.  Right Ear: External ear normal.  Left Ear: External ear normal.  Nose: Nose normal.  Mouth/Throat: Oropharynx is clear and moist. No oropharyngeal exudate.  Tm normal bilaterally. Maxillary sinuses tender on percussion. Tonsils unremarkable.   Eyes: EOM are normal. Pupils are equal, round, and reactive to light.  Neck: Normal range of motion. Neck supple.  Cardiovascular: Normal rate, regular rhythm and normal heart sounds.   Pulmonary/Chest: Effort normal and breath sounds normal. No respiratory distress. She has no wheezes.  Abdominal: Soft. Bowel sounds are normal. She exhibits no distension. There is no tenderness.  Musculoskeletal: Normal range of motion.  Lymphadenopathy:    She has no cervical adenopathy.  Neurological: She is alert and oriented to person, place, and time.  Skin: Skin is warm and dry. No rash noted.  Nursing note and vitals reviewed.   Urgent Care Course   Clinical Course     Procedures (including critical care time)  Labs Review Labs Reviewed  RAPID STREP SCREEN (NOT AT [facility])  CULTURE, GROUP A STREP [facility])    Imaging Review No results found.  MDM   1. Viral pharyngitis   2. Upper respiratory tract infection, unspecified type    No concerning finding on physical exam. Rapid strep negative; culture is pending. Patient informed that I don't suspect a sinus infection, however patient is adamant that she is getting one and wants to  get it taken care of before the holidays come. Rx for z-pack given, however informed to start the abx only if she does not improve. Patient states understanding and will call with questions or problems. Patient instructed to call or follow up with his/her primary care doctor if failure to improve or change in symptoms. Discharge instruction given.     Barry Dienes, NP 09/03/16 1134

## 2016-09-06 LAB — CULTURE, GROUP A STREP (THRC)

## 2016-09-07 ENCOUNTER — Telehealth: Payer: Self-pay

## 2016-09-07 NOTE — Telephone Encounter (Signed)
-----   Message from Frederich Cha, MD sent at 09/06/2016  8:58 PM EST ----- Please verify patient that her strep culture was negative which is consistent with her rapid strep being negative in the office.

## 2016-09-07 NOTE — Telephone Encounter (Signed)
Courtesy call back completed today for patients recent visit at [facility]. Patient did not answer, left message on voicemail to call back with any questions or concerns. Also inform her of her negative strep result

## 2016-09-19 ENCOUNTER — Telehealth: Payer: Self-pay | Admitting: Gastroenterology

## 2016-09-19 ENCOUNTER — Encounter: Payer: Self-pay | Admitting: *Deleted

## 2016-09-19 NOTE — Telephone Encounter (Signed)
Please call in suprep rx to [facility] in [place]

## 2016-09-20 ENCOUNTER — Other Ambulatory Visit: Payer: Self-pay

## 2016-09-20 DIAGNOSIS — Z1211 Encounter for screening for malignant neoplasm of colon: Secondary | ICD-10-CM

## 2016-09-20 MED ORDER — NA SULFATE-K SULFATE-MG SULF 17.5-3.13-1.6 GM/177ML PO SOLN: 1.0000 | ORAL | 0 refills | Status: DC

## 2016-09-20 NOTE — Telephone Encounter (Signed)
Rx sent to [place] per pt request.

## 2016-09-21 ENCOUNTER — Telehealth: Payer: Self-pay | Admitting: *Deleted

## 2016-09-21 NOTE — Telephone Encounter (Signed)
Called patient and LVM.

## 2016-09-21 NOTE — Telephone Encounter (Signed)
Patient called and states she has a procedure on Friday and the cost of the Prescription that was sent in for her is too much and she was told to call back if she could pay for it. Patient is requesting a call back. Her number is [phone]. Thank you

## 2016-09-22 NOTE — Discharge Instructions (Signed)

## 2016-09-23 ENCOUNTER — Ambulatory Visit: Payer: BLUE CROSS/BLUE SHIELD | Admitting: Anesthesiology

## 2016-09-23 ENCOUNTER — Ambulatory Visit
Admission: RE | Admit: 2016-09-23 | Discharge: 2016-09-23 | Disposition: A | Discharge status: Home / Self Care | Payer: BLUE CROSS/BLUE SHIELD | Source: Ambulatory Visit | Attending: Gastroenterology | Admitting: Gastroenterology

## 2016-09-23 ENCOUNTER — Encounter
Admission: RE | Disposition: A | Discharge status: Home / Self Care | Payer: Self-pay | Source: Ambulatory Visit | Attending: Gastroenterology

## 2016-09-23 DIAGNOSIS — Z1211 Encounter for screening for malignant neoplasm of colon: Secondary | ICD-10-CM | POA: Diagnosis present

## 2016-09-23 DIAGNOSIS — D122 Benign neoplasm of ascending colon: Secondary | ICD-10-CM

## 2016-09-23 DIAGNOSIS — K635 Polyp of colon: Secondary | ICD-10-CM

## 2016-09-23 DIAGNOSIS — D125 Benign neoplasm of sigmoid colon: Secondary | ICD-10-CM

## 2016-09-23 DIAGNOSIS — K640 First degree hemorrhoids: Secondary | ICD-10-CM | POA: Diagnosis not present

## 2016-09-23 HISTORY — DX: Bursopathy, unspecified: M71.9

## 2016-09-23 HISTORY — DX: Unspecified osteoarthritis, unspecified site: M19.90

## 2016-09-23 HISTORY — PX: POLYPECTOMY: SHX5525

## 2016-09-23 HISTORY — DX: Claustrophobia: F40.240

## 2016-09-23 HISTORY — DX: Motion sickness, initial encounter: T75.3XXA

## 2016-09-23 HISTORY — DX: Unspecified convulsions: R56.9

## 2016-09-23 HISTORY — PX: COLONOSCOPY WITH PROPOFOL: SHX5780

## 2016-09-23 HISTORY — DX: Calculus of kidney: N20.0

## 2016-09-23 SURGERY — COLONOSCOPY WITH PROPOFOL
Anesthesia: Monitor Anesthesia Care | Wound class: Contaminated

## 2016-09-23 MED ORDER — ACETAMINOPHEN 325 MG PO TABS: 325.0000 mg | ORAL_TABLET | ORAL | Status: DC | PRN

## 2016-09-23 MED ORDER — LACTATED RINGERS IV SOLN
INTRAVENOUS | Status: DC
  Administered 2016-09-23: 08:00:00 via INTRAVENOUS

## 2016-09-23 MED ORDER — PROPOFOL 10 MG/ML IV BOLUS
INTRAVENOUS | Status: DC | PRN
  Administered 2016-09-23 (×8): 50 mg via INTRAVENOUS

## 2016-09-23 MED ORDER — STERILE WATER FOR IRRIGATION IR SOLN
Status: DC | PRN
  Administered 2016-09-23: 08:00:00

## 2016-09-23 MED ORDER — LIDOCAINE HCL (CARDIAC) 20 MG/ML IV SOLN
INTRAVENOUS | Status: DC | PRN
  Administered 2016-09-23: 50 mg via INTRAVENOUS

## 2016-09-23 MED ORDER — ACETAMINOPHEN 160 MG/5ML PO SOLN: 325.0000 mg | ORAL | Status: DC | PRN

## 2016-09-23 SURGICAL SUPPLY — 23 items
CANISTER SUCT 1200ML W/VALVE (MISCELLANEOUS) ×3 IMPLANT
CLIP HMST 235XBRD CATH ROT (MISCELLANEOUS) ×2 IMPLANT
CLIP RESOLUTION 360 11X235 (MISCELLANEOUS) ×1
FCP ESCP3.2XJMB 240X2.8X (MISCELLANEOUS)
FORCEPS BIOP RAD 4 LRG CAP 4 (CUTTING FORCEPS) IMPLANT
FORCEPS BIOP RJ4 240 W/NDL (MISCELLANEOUS)
FORCEPS ESCP3.2XJMB 240X2.8X (MISCELLANEOUS) IMPLANT
GOWN CVR UNV OPN BCK APRN NK (MISCELLANEOUS) ×4 IMPLANT
GOWN ISOL THUMB LOOP REG UNIV (MISCELLANEOUS) ×2
INJECTOR VARIJECT VIN23 (MISCELLANEOUS) IMPLANT
KIT DEFENDO VALVE AND CONN (KITS) IMPLANT
KIT ENDO PROCEDURE OLY (KITS) ×3 IMPLANT
MARKER SPOT ENDO TATTOO 5ML (MISCELLANEOUS) IMPLANT
PAD GROUND ADULT SPLIT (MISCELLANEOUS) ×3 IMPLANT
PROBE APC STR FIRE (PROBE) IMPLANT
RETRIEVER NET ROTH 2.5X230 LF (MISCELLANEOUS) IMPLANT
SNARE SHORT THROW 13M SML OVAL (MISCELLANEOUS) ×3 IMPLANT
SNARE SHORT THROW 30M LRG OVAL (MISCELLANEOUS) IMPLANT
SNARE SNG USE RND 15MM (INSTRUMENTS) IMPLANT
SPOT EX ENDOSCOPIC TATTOO (MISCELLANEOUS)
TRAP ETRAP POLY (MISCELLANEOUS) ×3 IMPLANT
VARIJECT INJECTOR VIN23 (MISCELLANEOUS)
WATER STERILE IRR 250ML POUR (IV SOLUTION) ×3 IMPLANT

## 2016-09-23 NOTE — Op Note (Signed)
[facility] Patient Name: Toni Green Procedure Date: 09/23/2016 7:57 AM MRN: [id number] Account #: [id number] Date of Birth: 05-01-1961 Admit Type: Outpatient Age: 55 Room: [facility] OR ROOM 01 Gender: Female Note Status: Finalized Procedure:            Colonoscopy Indications:          Screening for colorectal malignant neoplasm Providers:            Lucilla Lame MD, MD Referring MD:         Halina Maidens, MD (Referring MD) Medicines:            Propofol per Anesthesia Complications:        No immediate complications. Procedure:            Pre-Anesthesia Assessment:                       - Prior to the procedure, a History and Physical was                        performed, and patient medications and allergies were                        reviewed. The patient's tolerance of previous                        anesthesia was also reviewed. The risks and benefits of                        the procedure and the sedation options and risks were                        discussed with the patient. All questions were                        answered, and informed consent was obtained. Prior                        Anticoagulants: The patient has taken no previous                        anticoagulant or antiplatelet agents. ASA Grade                        Assessment: II - A patient with mild systemic disease.                        After reviewing the risks and benefits, the patient was                        deemed in satisfactory condition to undergo the                        procedure.                       After obtaining informed consent, the colonoscope was                        passed under direct vision. Throughout the procedure,  the patient's blood pressure, pulse, and oxygen                        saturations were monitored continuously. The was                        introduced through the anus and advanced to the the                         cecum, identified by appendiceal orifice and ileocecal                        valve. The colonoscopy was performed without                        difficulty. The patient tolerated the procedure well.                        The quality of the bowel preparation was good. Findings:      The perianal and digital rectal examinations were normal.      A 10 mm polyp was found in the ascending colon. The polyp was       pedunculated. The polyp was removed with a hot snare. Resection and       retrieval were complete. To prevent bleeding post-intervention, one       hemostatic clip was successfully placed (MR conditional). There was no       bleeding at the end of the procedure.      A 6 mm polyp was found in the sigmoid colon. The polyp was sessile. The       polyp was removed with a cold snare. Resection and retrieval were       complete.      Non-bleeding internal hemorrhoids were found during retroflexion. The       hemorrhoids were Grade I (internal hemorrhoids that do not prolapse). Impression:           - One 10 mm polyp in the ascending colon, removed with                        a hot snare. Resected and retrieved. Clip (MR                        conditional) was placed.                       - One 6 mm polyp in the sigmoid colon, removed with a                        cold snare. Resected and retrieved.                       - Non-bleeding internal hemorrhoids. Recommendation:       - Discharge patient to home.                       - Resume previous diet.                       - Continue present medications.                       -  Await pathology results.                       - Repeat colonoscopy in 5 years if polyp adenoma and 10                        years if hyperplastic Procedure Code(s):    --- Professional ---                       [phone], Colonoscopy, flexible; with removal of tumor(s),                        polyp(s), or other lesion(s) by snare  technique Diagnosis Code(s):    --- Professional ---                       Z12.11, Encounter for screening for malignant neoplasm                        of colon                       D12.0, Benign neoplasm of cecum                       D12.5, Benign neoplasm of sigmoid colon CPT copyright 2016 American Medical Association. All rights reserved. The codes documented in this report are preliminary and upon coder review may  be revised to meet current compliance requirements. Lucilla Lame MD, MD 09/23/2016 8:27:31 AM This report has been signed electronically. Number of Addenda: 0 Note Initiated On: 09/23/2016 7:57 AM Scope Withdrawal Time: 0 hours 13 minutes 32 seconds  Total Procedure Duration: 0 hours 20 minutes 41 seconds       [facility]

## 2016-09-23 NOTE — Anesthesia Postprocedure Evaluation (Signed)
Anesthesia Post Note  Patient: Toni Green  Procedure(s) Performed: Procedure(s) (LRB): COLONOSCOPY WITH PROPOFOL (N/A) POLYPECTOMY  Patient location during evaluation: PACU Anesthesia Type: MAC Level of consciousness: awake and alert and oriented Pain management: satisfactory to patient Vital Signs Assessment: post-procedure vital signs reviewed and stable Respiratory status: spontaneous breathing, nonlabored ventilation and respiratory function stable Cardiovascular status: blood pressure returned to baseline and stable Postop Assessment: Adequate PO intake and No signs of nausea or vomiting Anesthetic complications: no    Raliegh Ip

## 2016-09-23 NOTE — Anesthesia Preprocedure Evaluation (Signed)
Anesthesia Evaluation  Patient identified by MRN, date of birth, ID band Patient awake    Reviewed: Allergy & Precautions, H&P , NPO status , Patient's Chart, lab work & pertinent test results  Airway Mallampati: II  TM Distance: >3 FB Neck ROM: full    Dental no notable dental hx.    Pulmonary    Pulmonary exam normal        Cardiovascular hypertension, Normal cardiovascular exam     Neuro/Psych Seizures -,  PSYCHIATRIC DISORDERS    GI/Hepatic   Endo/Other    Renal/GU Renal disease     Musculoskeletal   Abdominal   Peds  Hematology   Anesthesia Other Findings   Reproductive/Obstetrics                             Anesthesia Physical Anesthesia Plan  ASA: II  Anesthesia Plan: MAC   Post-op Pain Management:    Induction:   Airway Management Planned:   Additional Equipment:   Intra-op Plan:   Post-operative Plan:   Informed Consent: I have reviewed the patients History and Physical, chart, labs and discussed the procedure including the risks, benefits and alternatives for the proposed anesthesia with the patient or authorized representative who has indicated his/her understanding and acceptance.     Plan Discussed with:   Anesthesia Plan Comments:         Anesthesia Quick Evaluation

## 2016-09-23 NOTE — Transfer of Care (Signed)
Immediate Anesthesia Transfer of Care Note  Patient: Toni Green  Procedure(s) Performed: Procedure(s): COLONOSCOPY WITH PROPOFOL (N/A)  Patient Location: PACU  Anesthesia Type: MAC  Level of Consciousness: awake, alert  and patient cooperative  Airway and Oxygen Therapy: Patient Spontanous Breathing and Patient connected to supplemental oxygen  Post-op Assessment: Post-op Vital signs reviewed, Patient's Cardiovascular Status Stable, Respiratory Function Stable, Patent Airway and No signs of Nausea or vomiting  Post-op Vital Signs: Reviewed and stable  Complications: No apparent anesthesia complications

## 2016-09-23 NOTE — Anesthesia Procedure Notes (Signed)
Procedure Name: MAC Performed by: Cristan Hout Pre-anesthesia Checklist: Patient identified, Emergency Drugs available, Suction available, Timeout performed and Patient being monitored Patient Re-evaluated:Patient Re-evaluated prior to inductionOxygen Delivery Method: Nasal cannula Placement Confirmation: positive ETCO2     

## 2016-09-23 NOTE — H&P (Signed)
Lucilla Lame, MD [facility] [address], [place], [place] [address] Phone: [phone] Fax : [phone]  Primary Care Physician:  Halina Maidens, MD Primary Gastroenterologist:  Dr. Allen Norris  Pre-Procedure History & Physical: HPI:  Toni Green is a 55 y.o. female is here for a screening colonoscopy.   Past Medical History:  Diagnosis Date  . Anxiety   . Arthritis    fingers  . Bursitis    hips  . Claustrophobia   . Depression   . Hypertension   . Kidney stones   . Motion sickness    back seat cars  . Seizures ([place]) 1985   x1, after labor/childbirth  . Thyroid disease     Past Surgical History:  Procedure Laterality Date  . ABDOMINAL HYSTERECTOMY    . APPENDECTOMY    . BACK SURGERY  10/2005   lumbar  . GALLBLADDER SURGERY    . GASTRIC BYPASS  2008    Prior to Admission medications   Medication Sig Start Date End Date Taking? Authorizing Provider  busPIRone (BUSPAR) 10 MG tablet Take 1 tablet (10 mg total) by mouth 2 (two) times daily. 08/26/16  Yes Rainey Pines, MD  desvenlafaxine (PRISTIQ) 100 MG 24 hr tablet Take 1 tablet (100 mg total) by mouth daily. 07/25/16  Yes Rainey Pines, MD  furosemide (LASIX) 20 MG tablet TAKE 1 TABLET DAILY 02/12/16  Yes Glean Hess, MD  HYDROcodone-acetaminophen (NORCO/VICODIN) 5-325 MG tablet  03/08/16  Yes Historical Provider, MD  irbesartan (AVAPRO) 150 MG tablet Take 1 tablet (150 mg total) by mouth daily. 08/26/16  Yes Glean Hess, MD  lamoTRIgine (LAMICTAL) 150 MG tablet Take 1 tablet (150 mg total) by mouth daily. 07/25/16  Yes Rainey Pines, MD  levothyroxine (SYNTHROID) 50 MCG tablet Take 1 tablet (50 mcg total) by mouth daily before breakfast. 05/25/16  Yes Glean Hess, MD  Na Sulfate-K Sulfate-Mg Sulf (SUPREP BOWEL PREP KIT) 17.5-3.13-1.6 GM/180ML SOLN Take 1 kit by mouth as directed. 09/20/16  Yes Lucilla Lame, MD  prazosin (MINIPRESS) 5 MG capsule Take 1 capsule (5 mg total) by mouth at bedtime. 08/26/16  Yes Glean Hess, MD  tamsulosin (FLOMAX) 0.4 MG CAPS capsule Take 0.4 mg by mouth as needed.  06/28/13  Yes Historical Provider, MD  traZODone (DESYREL) 100 MG tablet Take 1 tablet (100 mg total) by mouth at bedtime. 07/25/16  Yes Rainey Pines, MD  vitamin B-12 (CYANOCOBALAMIN) 250 MCG tablet Take 1 tablet (250 mcg total) by mouth daily. 02/23/16  Yes Rainey Pines, MD    Allergies as of 06/07/2016 - Review Complete 06/07/2016  Allergen Reaction Noted  . Losartan  04/23/2015  . Oxycodone-acetaminophen Other (See Comments) 07/07/2015  . Penicillins Rash 03/26/2015    Family History  Problem Relation Age of Onset  . Depression Mother   . Hypertension Mother   . Depression Father   . Hypertension Father   . Depression Sister   . Hypertension Maternal Grandfather   . Depression Maternal Grandmother   . Hypertension Maternal Grandmother   . Hypertension Paternal Grandfather   . Hypertension Paternal Grandmother     Social History   Social History  . Marital status: Married    Spouse name: N/A  . Number of children: N/A  . Years of education: N/A   Occupational History  . Not on file.   Social History Main Topics  . Smoking status: Never Smoker  . Smokeless tobacco: Never Used  . Alcohol use No  .  Drug use: No  . Sexual activity: Not Currently   Other Topics Concern  . Not on file   Social History Narrative  . No narrative on file    Review of Systems: See HPI, otherwise negative ROS  Physical Exam: Ht 5' 10"  (1.778 m)   Wt 179 lb (81.2 kg)   BMI 25.68 kg/m  General:   Alert,  pleasant and cooperative in NAD Head:  Normocephalic and atraumatic. Neck:  Supple; no masses or thyromegaly. Lungs:  Clear throughout to auscultation.    Heart:  Regular rate and rhythm. Abdomen:  Soft, nontender and nondistended. Normal bowel sounds, without guarding, and without rebound.   Neurologic:  Alert and  oriented x4;  grossly normal neurologically.  Impression/Plan: Toni Green  is now here to undergo a screening colonoscopy.  Risks, benefits, and alternatives regarding colonoscopy have been reviewed with the patient.  Questions have been answered.  All parties agreeable.

## 2016-09-23 NOTE — Anesthesia Procedure Notes (Deleted)
Performed by: Izel Eisenhardt Pre-anesthesia Checklist: Patient identified, Emergency Drugs available, Suction available, Timeout performed and Patient being monitored Patient Re-evaluated:Patient Re-evaluated prior to inductionOxygen Delivery Method: Circle system utilized Preoxygenation: Pre-oxygenation with 100% oxygen Intubation Type: Inhalational induction Ventilation: Mask ventilation without difficulty and Mask ventilation throughout procedure Dental Injury: Teeth and Oropharynx as per pre-operative assessment        

## 2016-09-26 ENCOUNTER — Encounter: Payer: Self-pay | Admitting: Gastroenterology

## 2016-09-27 ENCOUNTER — Encounter: Payer: Self-pay | Admitting: Gastroenterology

## 2016-09-29 ENCOUNTER — Encounter: Payer: Self-pay | Admitting: Gastroenterology

## 2016-10-28 ENCOUNTER — Ambulatory Visit (INDEPENDENT_AMBULATORY_CARE_PROVIDER_SITE_OTHER): Payer: 59 | Admitting: Psychiatry

## 2016-10-28 ENCOUNTER — Encounter: Payer: Self-pay | Admitting: Psychiatry

## 2016-10-28 VITALS — BP 151/98 | HR 62 | Temp 98.5°F | Wt 186.8 lb

## 2016-10-28 DIAGNOSIS — F331 Major depressive disorder, recurrent, moderate: Secondary | ICD-10-CM

## 2016-10-28 DIAGNOSIS — F411 Generalized anxiety disorder: Secondary | ICD-10-CM | POA: Diagnosis not present

## 2016-10-28 MED ORDER — PROPRANOLOL HCL 10 MG PO TABS: 10.0000 mg | ORAL_TABLET | Freq: Three times a day (TID) | ORAL | 1 refills | Status: DC

## 2016-10-28 NOTE — Progress Notes (Signed)
BH MD/PA/NP OP Progress Note  10/28/2016 9:24 AM Toni Green  MRN:  [id number]  Subjective:    Patient is a 56 year old married female who presented for follow-up.  She reported that she has been doing well. She reported that her husband is sick due to weather changes. Patient reported that she continues to have anxiety symptoms and she is having palpitations regular basis. Her blood pressure remains high. She reported that she was sent to the ER by her primary care physician but they did not change her medications. We discussed about her medications in detail. She is willing to have her medications adjusted. She has been compliant with her medications. She currently denied having any suicidal homicidal ideations or plans.    She appears calm and alert.   Chief Complaint:  Chief Complaint    Follow-up; Medication Refill     Visit Diagnosis:     ICD-9-CM ICD-10-CM   1. MDD (major depressive disorder), recurrent episode, moderate (HCC) 296.32 F33.1   2. Anxiety state 300.00 F41.1     Past Medical History:  Past Medical History:  Diagnosis Date  . Anxiety   . Arthritis    fingers  . Bursitis    hips  . Claustrophobia   . Depression   . Hypertension   . Kidney stones   . Motion sickness    back seat cars  . Seizures ([place]) 1985   x1, after labor/childbirth  . Thyroid disease     Past Surgical History:  Procedure Laterality Date  . ABDOMINAL HYSTERECTOMY    . APPENDECTOMY    . BACK SURGERY  10/2005   lumbar  . COLONOSCOPY WITH PROPOFOL N/A 09/23/2016   Procedure: COLONOSCOPY WITH PROPOFOL;  Surgeon: Lucilla Lame, MD;  Location: [facility];  Service: Endoscopy;  Laterality: N/A;  . GALLBLADDER SURGERY    . GASTRIC BYPASS  2008  . POLYPECTOMY  09/23/2016   Procedure: POLYPECTOMY;  Surgeon: Lucilla Lame, MD;  Location: [facility];  Service: Endoscopy;;   Family History:  Family History  Problem Relation Age of Onset  . Depression Mother   .  Hypertension Mother   . Depression Father   . Hypertension Father   . Depression Sister   . Hypertension Maternal Grandfather   . Depression Maternal Grandmother   . Hypertension Maternal Grandmother   . Hypertension Paternal Grandfather   . Hypertension Paternal Grandmother    Social History:  Social History   Social History  . Marital status: Married    Spouse name: N/A  . Number of children: N/A  . Years of education: N/A   Social History Main Topics  . Smoking status: Never Smoker  . Smokeless tobacco: Never Used  . Alcohol use No  . Drug use: No  . Sexual activity: Not Currently   Other Topics Concern  . None   Social History Narrative  . None   Additional History:  Lives with her husband and has been working full-time in [place]. She denied use of drugs or alcohol at this time.  Assessment:   Musculoskeletal: Strength & Muscle Tone: within normal limits Gait & Station: normal Patient leans: N/A  Psychiatric Specialty Exam: Medication Refill  Pertinent negatives include no neck pain.  Anxiety  Symptoms include insomnia and nervous/anxious behavior. Patient reports no suicidal ideas.      Review of Systems  Musculoskeletal: Negative for neck pain.  Psychiatric/Behavioral: Negative for depression, hallucinations, memory loss and suicidal ideas. Substance abuse: overuse of Klonopin  as discussed above. The patient is nervous/anxious and has insomnia.     Blood pressure (!) 151/98, pulse 62, temperature 98.5 F (36.9 C), temperature source Oral, weight 186 lb 12.8 oz (84.7 kg).Body mass index is 26.8 kg/m.  General Appearance: Fairly Groomed  Eye Contact:  Fair  Speech:  Normal Rate  Volume:  Normal  Mood:  Euthymic  Affect:  Congruent  Thought Process:  Coherent  Orientation:  Full (Time, Place, and Person)  Thought Content:  WDL  Suicidal Thoughts:  No  Homicidal Thoughts:  No  Memory:  Immediate;   Fair  Judgement:  Fair  Insight:  Fair   Psychomotor Activity:  Normal  Concentration:  Fair  Recall:  [employer] of Knowledge: Fair  Language: Fair  Akathisia:  No  Handed:  Right  AIMS (if indicated):  Assets:  Communication Skills Desire for Improvement Physical Health Social Support  ADL's:  Intact  Cognition: WNL  Sleep:     Is the patient at risk to self?  No. Has the patient been a risk to self in the past 6 months?  No. Has the patient been a risk to self within the distant past?  No. Is the patient a risk to others?  No. Has the patient been a risk to others in the past 6 months?  No. Has the patient been a risk to others within the distant past?  No.  Current Medications: Current Outpatient Prescriptions  Medication Sig Dispense Refill  . busPIRone (BUSPAR) 10 MG tablet Take 1 tablet (10 mg total) by mouth 2 (two) times daily. 180 tablet 1  . desvenlafaxine (PRISTIQ) 100 MG 24 hr tablet Take 1 tablet (100 mg total) by mouth daily. 90 tablet 2  . furosemide (LASIX) 20 MG tablet TAKE 1 TABLET DAILY 90 tablet 3  . HYDROcodone-acetaminophen (NORCO/VICODIN) 5-325 MG tablet     . irbesartan (AVAPRO) 150 MG tablet Take 1 tablet (150 mg total) by mouth daily. 90 tablet 3  . lamoTRIgine (LAMICTAL) 150 MG tablet Take 1 tablet (150 mg total) by mouth daily. 90 tablet 2  . levothyroxine (SYNTHROID) 50 MCG tablet Take 1 tablet (50 mcg total) by mouth daily before breakfast. 90 tablet 3  . Na Sulfate-K Sulfate-Mg Sulf (SUPREP BOWEL PREP KIT) 17.5-3.13-1.6 GM/180ML SOLN Take 1 kit by mouth as directed. 1 Bottle 0  . prazosin (MINIPRESS) 5 MG capsule Take 1 capsule (5 mg total) by mouth at bedtime. 90 capsule 3  . tamsulosin (FLOMAX) 0.4 MG CAPS capsule Take 0.4 mg by mouth as needed.     . traZODone (DESYREL) 100 MG tablet Take 1 tablet (100 mg total) by mouth at bedtime. 90 tablet 1  . vitamin B-12 (CYANOCOBALAMIN) 250 MCG tablet Take 1 tablet (250 mcg total) by mouth daily.    . propranolol (INDERAL) 10 MG tablet Take 1  tablet (10 mg total) by mouth 3 (three) times daily. 90 tablet 1   No current facility-administered medications for this visit.     Medical Decision Making:  Review of Psycho-Social Stressors (1) and Review of Last Therapy Session (1)  Treatment Plan Summary:Medication management    Depression She will continue on Pristiq 100 mg daily.  Mood symptoms Patient will continue on lamotrigine 150 mg daily.  Continue  BuSpar 10 mg by mouth twice a day to help with her anxiety symptoms.  Add propanolol 10 mg by mouth 3 times a day when necessary for her anxiety. Advised her to start on  a daily basis and then gradually titrate the dose. She agreed with the plan. She will continue on trazodone at bedtime.  Follow-up in 2 months or earlier depending on her symptoms      More than 50% of the time spent in psychoeducation, counseling and coordination of care.    This note was generated in part or whole with voice recognition software. Voice regonition is usually quite accurate but there are transcription errors that can and very often do occur. I apologize for any typographical errors that were not detected and corrected.        Rainey Pines, MD    10/28/2016, 9:24 AM

## 2016-11-01 ENCOUNTER — Ambulatory Visit (INDEPENDENT_AMBULATORY_CARE_PROVIDER_SITE_OTHER): Payer: 59 | Admitting: Internal Medicine

## 2016-11-01 ENCOUNTER — Encounter: Payer: Self-pay | Admitting: Internal Medicine

## 2016-11-01 VITALS — BP 160/100 | HR 84 | Temp 98.6°F | Ht 70.0 in | Wt 191.0 lb

## 2016-11-01 DIAGNOSIS — Z0000 Encounter for general adult medical examination without abnormal findings: Secondary | ICD-10-CM

## 2016-11-01 DIAGNOSIS — E033 Postinfectious hypothyroidism: Secondary | ICD-10-CM | POA: Diagnosis not present

## 2016-11-01 DIAGNOSIS — Z1231 Encounter for screening mammogram for malignant neoplasm of breast: Secondary | ICD-10-CM | POA: Diagnosis not present

## 2016-11-01 DIAGNOSIS — I10 Essential (primary) hypertension: Secondary | ICD-10-CM

## 2016-11-01 DIAGNOSIS — Z0001 Encounter for general adult medical examination with abnormal findings: Secondary | ICD-10-CM | POA: Diagnosis not present

## 2016-11-01 DIAGNOSIS — Z1239 Encounter for other screening for malignant neoplasm of breast: Secondary | ICD-10-CM

## 2016-11-01 DIAGNOSIS — F332 Major depressive disorder, recurrent severe without psychotic features: Secondary | ICD-10-CM

## 2016-11-01 LAB — POCT URINALYSIS DIPSTICK
Bilirubin, UA: NEGATIVE
Blood, UA: NEGATIVE
Glucose, UA: NEGATIVE
Ketones, UA: NEGATIVE
Leukocytes, UA: NEGATIVE
Nitrite, UA: NEGATIVE
Protein, UA: NEGATIVE
Spec Grav, UA: 1.015
Urobilinogen, UA: 0.2
pH, UA: 5

## 2016-11-01 MED ORDER — IRBESARTAN 300 MG PO TABS: 300.0000 mg | ORAL_TABLET | Freq: Every day | ORAL | 1 refills | Status: DC

## 2016-11-01 NOTE — Patient Instructions (Signed)
Breast Self-Awareness Introduction Breast self-awareness means being familiar with how your breasts look and feel. It involves checking your breasts regularly and reporting any changes to your health care provider. Practicing breast self-awareness is important. A change in your breasts can be a sign of a serious medical problem. Being familiar with how your breasts look and feel allows you to find any problems early, when treatment is more likely to be successful. All women should practice breast self-awareness, including women who have had breast implants. How to do a breast self-exam One way to learn what is normal for your breasts and whether your breasts are changing is to do a breast self-exam. To do a breast self-exam: Look for Changes  1. Remove all the clothing above your waist. 2. Stand in front of a mirror in a room with good lighting. 3. Put your hands on your hips. 4. Push your hands firmly downward. 5. Compare your breasts in the mirror. Look for differences between them (asymmetry), such as:  Differences in shape.  Differences in size.  Puckers, dips, and bumps in one breast and not the other. 6. Look at each breast for changes in your skin, such as:  Redness.  Scaly areas. 7. Look for changes in your nipples, such as:  Discharge.  Bleeding.  Dimpling.  Redness.  A change in position. Feel for Changes  Carefully feel your breasts for lumps and changes. It is best to do this while lying on your back on the floor and again while sitting or standing in the shower or tub with soapy water on your skin. Feel each breast in the following way:  Place the arm on the side of the breast you are examining above your head.  Feel your breast with the other hand.  Start in the nipple area and make  inch (2 cm) overlapping circles to feel your breast. Use the pads of your three middle fingers to do this. Apply light pressure, then medium pressure, then firm pressure. The light  pressure will allow you to feel the tissue closest to the skin. The medium pressure will allow you to feel the tissue that is a little deeper. The firm pressure will allow you to feel the tissue close to the ribs.  Continue the overlapping circles, moving downward over the breast until you feel your ribs below your breast.  Move one finger-width toward the center of the body. Continue to use the  inch (2 cm) overlapping circles to feel your breast as you move slowly up toward your collarbone.  Continue the up and down exam using all three pressures until you reach your armpit. Write Down What You Find  Write down what is normal for each breast and any changes that you find. Keep a written record with breast changes or normal findings for each breast. By writing this information down, you do not need to depend only on memory for size, tenderness, or location. Write down where you are in your menstrual cycle, if you are still menstruating. If you are having trouble noticing differences in your breasts, do not get discouraged. With time you will become more familiar with the variations in your breasts and more comfortable with the exam. How often should I examine my breasts? Examine your breasts every month. If you are breastfeeding, the best time to examine your breasts is after a feeding or after using a breast pump. If you menstruate, the best time to examine your breasts is 5-7 days after your  period is over. During your period, your breasts are lumpier, and it may be more difficult to notice changes. When should I see my health care provider? See your health care provider if you notice:  A change in shape or size of your breasts or nipples.  A change in the skin of your breast or nipples, such as a reddened or scaly area.  Unusual discharge from your nipples.  A lump or thick area that was not there before.  Pain in your breasts.  Anything that concerns you. This information is not  intended to replace advice given to you by your health care provider. Make sure you discuss any questions you have with your health care provider. Document Released: 10/03/2005 Document Revised: 03/10/2016 Document Reviewed: 08/23/2015  2017 Elsevier

## 2016-11-01 NOTE — Progress Notes (Signed)
Date:  11/01/2016   Name:  Toni Green   DOB:  21-Mar-1961   MRN:  [id number]   Chief Complaint: Annual Exam Toni Green is a 56 y.o. female who presents today for her Complete Annual Exam. She feels well. She reports exercising some. She reports she is sleeping well. She denies breast problems.  She needs to reschedule her mammogram.  Hypertension  This is a chronic problem. Associated symptoms include palpitations. Pertinent negatives include no chest pain, headaches or shortness of breath. Past treatments include angiotensin blockers, diuretics and alpha 1 blockers. Hypertensive end-organ damage includes a thyroid problem.  Thyroid Problem  Presents for follow-up visit. Symptoms include palpitations. Patient reports no anxiety, constipation, depressed mood, diaphoresis, diarrhea, fatigue, tremors or weight gain. The symptoms have been stable.  Depression - doing very well on multiple medications. Propranolol was just added to help with anxiety and palpitations.  Review of Systems  Constitutional: Negative for chills, diaphoresis, fatigue, fever and weight gain.  HENT: Negative for congestion, hearing loss, tinnitus, trouble swallowing and voice change.   Eyes: Negative for visual disturbance.  Respiratory: Negative for cough, chest tightness, shortness of breath and wheezing.   Cardiovascular: Positive for palpitations. Negative for chest pain and leg swelling.  Gastrointestinal: Negative for abdominal pain, constipation, diarrhea and vomiting.  Endocrine: Negative for polydipsia and polyuria.  Genitourinary: Negative for dysuria, frequency, genital sores, vaginal bleeding and vaginal discharge.  Musculoskeletal: Negative for arthralgias, gait problem and joint swelling.  Skin: Negative for color change and rash.  Neurological: Negative for dizziness, tremors, light-headedness and headaches.  Hematological: Negative for adenopathy. Does not bruise/bleed easily.    Psychiatric/Behavioral: Negative for dysphoric mood and sleep disturbance. The patient is not nervous/anxious.     Patient Active Problem List   Diagnosis Date Noted  . Special screening for malignant neoplasms, colon   . Benign neoplasm of ascending colon   . Polyp of sigmoid colon   . Menopause syndrome 07/26/2016  . Hypothyroidism 05/25/2016  . Essential (primary) hypertension 07/20/2015  . 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 disorder 07/20/2015  . 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 Severe episode of recurrent major depressive disorder, without psychotic features ([place]) 07/20/2015  . Borderline personality disorder 04/23/2015  . 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 Complication of surgery 58/30/9407  . Bursitis, trochanteric 10/01/2014  . Inflammation of sacroiliac joint ([place]) 08/22/2014  . 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 DDD (degenerative disc disease), cervical 03/17/2014  . 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 Hyperoxaluria ([place]) 08/15/2013  . Calculus of kidney 02/13/2013    Prior to Admission medications   Medication Sig Start Date End Date Taking? Authorizing Provider  desvenlafaxine (PRISTIQ) 100 MG 24 hr tablet Take 1 tablet (100 mg total) by mouth daily. 07/25/16  Yes Rainey Pines, MD  furosemide (LASIX) 20 MG tablet TAKE 1 TABLET DAILY 02/12/16  Yes Glean Hess, MD  HYDROcodone-acetaminophen (NORCO/VICODIN) 5-325 MG tablet  03/08/16  Yes Historical Provider, MD  irbesartan (AVAPRO) 150 MG tablet Take 1 tablet (150 mg total) by mouth daily. 08/26/16  Yes Glean Hess, MD  lamoTRIgine (LAMICTAL) 150 MG tablet Take 1 tablet (150 mg total) by mouth daily. 07/25/16  Yes Rainey Pines, MD  levothyroxine (SYNTHROID) 50 MCG tablet Take 1 tablet (50 mcg total) by mouth daily before breakfast. 05/25/16  Yes Glean Hess, MD  Na Sulfate-K Sulfate-Mg Sulf (SUPREP BOWEL PREP KIT) 17.5-3.13-1.6 GM/180ML SOLN Take 1 kit  by mouth as  directed. 09/20/16  Yes Lucilla Lame, MD  prazosin (MINIPRESS) 5 MG capsule Take 1 capsule (5 mg total) by mouth at bedtime. 08/26/16  Yes Glean Hess, MD  propranolol (INDERAL) 10 MG tablet Take 1 tablet (10 mg total) by mouth 3 (three) times daily. 10/28/16  Yes Rainey Pines, MD  busPIRone (BUSPAR) 10 MG tablet Take 1 tablet (10 mg total) by mouth 2 (two) times daily. 08/26/16   Rainey Pines, MD  tamsulosin (FLOMAX) 0.4 MG CAPS capsule Take 0.4 mg by mouth as needed.  06/28/13   Historical Provider, MD  traZODone (DESYREL) 100 MG tablet Take 1 tablet (100 mg total) by mouth at bedtime. 07/25/16   Rainey Pines, MD  vitamin B-12 (CYANOCOBALAMIN) 250 MCG tablet Take 1 tablet (250 mcg total) by mouth daily. 02/23/16   Rainey Pines, MD    Allergies  Allergen Reactions  . Losartan     Could feel heartbeat (strongly) in neck  . Tape     Skin irritation after back surgery  . Oxycodone-Acetaminophen Itching       . Penicillins Rash    Past Surgical History:  Procedure Laterality Date  . ABDOMINAL HYSTERECTOMY    . APPENDECTOMY    . BACK SURGERY  10/2005   lumbar  . COLONOSCOPY WITH PROPOFOL N/A 09/23/2016   Procedure: COLONOSCOPY WITH PROPOFOL;  Surgeon: Lucilla Lame, MD;  Location: [facility];  Service: Endoscopy;  Laterality: N/A;  . GALLBLADDER SURGERY    . GASTRIC BYPASS  2008  . POLYPECTOMY  09/23/2016   Procedure: POLYPECTOMY;  Surgeon: Lucilla Lame, MD;  Location: [facility];  Service: Endoscopy;;    Social History  Substance Use Topics  . Smoking status: Never Smoker  . Smokeless tobacco: Never Used  . Alcohol use No     Medication list has been reviewed and updated.   Physical Exam  Constitutional: She is oriented to person, place, and time. She appears well-developed and well-nourished. No distress.  HENT:  Head: Normocephalic and atraumatic.  Right Ear: Tympanic membrane and ear canal normal.  Left Ear: Tympanic membrane and ear canal normal.  Nose:  Right sinus exhibits no maxillary sinus tenderness. Left sinus exhibits no maxillary sinus tenderness.  Mouth/Throat: Uvula is midline and oropharynx is clear and moist.  Eyes: Conjunctivae and EOM are normal. Right eye exhibits no discharge. Left eye exhibits no discharge. No scleral icterus.  Neck: Normal range of motion. Carotid bruit is not present. No erythema present. No thyromegaly present.  Cardiovascular: Normal rate, regular rhythm, normal heart sounds and normal pulses.   Pulmonary/Chest: Effort normal. No respiratory distress. She has no wheezes.  Abdominal: Soft. Bowel sounds are normal. There is no hepatosplenomegaly. There is no tenderness. There is no CVA tenderness.  Musculoskeletal: Normal range of motion.  Lymphadenopathy:    She has no cervical adenopathy.    She has no axillary adenopathy.  Neurological: She is alert and oriented to person, place, and time. She has normal reflexes. No cranial nerve deficit or sensory deficit.  Skin: Skin is warm, dry and intact. No rash noted.  Psychiatric: She has a normal mood and affect. Her speech is normal and behavior is normal. Thought content normal.  Nursing note and vitals reviewed.   BP (!) 160/100   Pulse 84   Temp 98.6 F (37 C)   Ht _0  (1.778 m)   Wt 191 lb (86.6 kg)   SpO2 98%   BMI 27.41 kg/m  Assessment and Plan: 1. Annual physical exam Schedule mammogram - Lipid panel - POCT urinalysis dipstick  2. Breast cancer screening Schedule mammogram  3. Essential (primary) hypertension Increase dose of Avapro - irbesartan (AVAPRO) 300 MG tablet; Take 1 tablet (300 mg total) by mouth daily.  Dispense: 90 tablet; Refill: 1 - CBC with Differential/Platelet - Comprehensive metabolic panel  4. Postinfectious hypothyroidism supplemented - TSH  5. Severe episode of recurrent major depressive disorder, without psychotic features ([place]) Doing better with psychiatric care   Halina Maidens, MD [facility]  11/01/2016

## 2016-11-02 LAB — COMPREHENSIVE METABOLIC PANEL
ALT: 28 IU/L (ref 0–32)
AST: 26 IU/L (ref 0–40)
Albumin/Globulin Ratio: 1.9 (ref 1.2–2.2)
Albumin: 4.2 g/dL (ref 3.5–5.5)
Alkaline Phosphatase: 129 IU/L — ABNORMAL HIGH (ref 39–117)
BUN/Creatinine Ratio: 20 (ref 9–23)
BUN: 13 mg/dL (ref 6–24)
Bilirubin Total: 0.3 mg/dL (ref 0.0–1.2)
CO2: 24 mmol/L (ref 18–29)
Calcium: 9.2 mg/dL (ref 8.7–10.2)
Chloride: 102 mmol/L (ref 96–106)
Creatinine, Ser: 0.66 mg/dL (ref 0.57–1.00)
GFR calc Af Amer: 117 mL/min/{1.73_m2} (ref >59)
GFR calc non Af Amer: 101 mL/min/{1.73_m2} (ref >59)
Globulin, Total: 2.2 g/dL (ref 1.5–4.5)
Glucose: 87 mg/dL (ref 65–99)
Potassium: 4.6 mmol/L (ref 3.5–5.2)
Sodium: 142 mmol/L (ref 134–144)
Total Protein: 6.4 g/dL (ref 6.0–8.5)

## 2016-11-02 LAB — CBC WITH DIFFERENTIAL/PLATELET
Basophils Absolute: 0 10*3/uL (ref 0.0–0.2)
Basos: 1 %
EOS (ABSOLUTE): 0.2 10*3/uL (ref 0.0–0.4)
Eos: 3 %
Hematocrit: 39.7 % (ref 34.0–46.6)
Hemoglobin: 13.2 g/dL (ref 11.1–15.9)
Immature Grans (Abs): 0 10*3/uL (ref 0.0–0.1)
Immature Granulocytes: 0 %
Lymphocytes Absolute: 1.7 10*3/uL (ref 0.7–3.1)
Lymphs: 30 %
MCH: 31.1 pg (ref 26.6–33.0)
MCHC: 33.2 g/dL (ref 31.5–35.7)
MCV: 94 fL (ref 79–97)
Monocytes Absolute: 0.4 10*3/uL (ref 0.1–0.9)
Monocytes: 8 %
Neutrophils Absolute: 3.2 10*3/uL (ref 1.4–7.0)
Neutrophils: 58 %
Platelets: 295 10*3/uL (ref 150–379)
RBC: 4.24 x10E6/uL (ref 3.77–5.28)
RDW: 13.6 % (ref 12.3–15.4)
WBC: 5.5 10*3/uL (ref 3.4–10.8)

## 2016-11-02 LAB — LIPID PANEL
Chol/HDL Ratio: 2.3 ratio units (ref 0.0–4.4)
Cholesterol, Total: 200 mg/dL — ABNORMAL HIGH (ref 100–199)
HDL: 88 mg/dL (ref >39)
LDL Calculated: 100 mg/dL — ABNORMAL HIGH (ref 0–99)
Triglycerides: 58 mg/dL (ref 0–149)
VLDL Cholesterol Cal: 12 mg/dL (ref 5–40)

## 2016-11-02 LAB — TSH: TSH: 5.16 u[IU]/mL — ABNORMAL HIGH (ref 0.450–4.500)

## 2016-11-03 ENCOUNTER — Other Ambulatory Visit: Payer: Self-pay | Admitting: Internal Medicine

## 2016-11-03 DIAGNOSIS — E033 Postinfectious hypothyroidism: Secondary | ICD-10-CM

## 2016-11-03 MED ORDER — LEVOTHYROXINE SODIUM 75 MCG PO TABS: 75.0000 ug | ORAL_TABLET | Freq: Every day | ORAL | 3 refills | Status: DC

## 2016-11-10 ENCOUNTER — Telehealth: Payer: Self-pay

## 2016-11-10 NOTE — Telephone Encounter (Signed)
PT CALLED LEFT MESSAGE THAT SHE IS STILL HAVING SOME ANXIETY AND SHE FEELS THAT THE PROPRANOLOL 10MG  TID NEEDS TO BE INCREASED. SHE SAID SHE CAN TELL IT IS WORKING BUT IT DOESN'T FEEL LIKE IT IS ENOUGH.

## 2016-11-11 NOTE — Telephone Encounter (Signed)
Ask her to continue taking it.Too soon to titrate the dose of the medication. She can make f/u appt to discuss meds.

## 2016-11-11 NOTE — Telephone Encounter (Signed)
left message to call office.but instructions were left on the voice message

## 2016-12-19 ENCOUNTER — Telehealth: Payer: Self-pay

## 2016-12-19 NOTE — Telephone Encounter (Signed)
pt called states she has an appt but will not have enough medication to last until appt. date.

## 2016-12-19 NOTE — Telephone Encounter (Signed)
left message on doctor's line for refill on the propranolol 10mg 

## 2016-12-26 ENCOUNTER — Ambulatory Visit
Admission: RE | Admit: 2016-12-26 | Discharge: 2016-12-26 | Disposition: A | Discharge status: Home / Self Care | Payer: 59 | Source: Ambulatory Visit | Attending: Internal Medicine | Admitting: Internal Medicine

## 2016-12-26 ENCOUNTER — Ambulatory Visit (INDEPENDENT_AMBULATORY_CARE_PROVIDER_SITE_OTHER): Payer: 59 | Admitting: Psychiatry

## 2016-12-26 DIAGNOSIS — F331 Major depressive disorder, recurrent, moderate: Secondary | ICD-10-CM | POA: Diagnosis not present

## 2016-12-26 DIAGNOSIS — Z1239 Encounter for other screening for malignant neoplasm of breast: Secondary | ICD-10-CM

## 2016-12-26 DIAGNOSIS — F411 Generalized anxiety disorder: Secondary | ICD-10-CM | POA: Diagnosis not present

## 2016-12-26 DIAGNOSIS — Z1231 Encounter for screening mammogram for malignant neoplasm of breast: Secondary | ICD-10-CM | POA: Diagnosis not present

## 2016-12-26 HISTORY — DX: Malignant (primary) neoplasm, unspecified: C80.1

## 2016-12-26 MED ORDER — TRAZODONE HCL 100 MG PO TABS: 100.0000 mg | ORAL_TABLET | Freq: Every day | ORAL | 1 refills | Status: DC

## 2016-12-26 MED ORDER — DESVENLAFAXINE SUCCINATE ER 50 MG PO TB24: 50.0000 mg | ORAL_TABLET | Freq: Every day | ORAL | 0 refills | Status: DC

## 2016-12-26 MED ORDER — PROPRANOLOL HCL 10 MG PO TABS: 10.0000 mg | ORAL_TABLET | Freq: Three times a day (TID) | ORAL | 1 refills | Status: DC

## 2016-12-26 MED ORDER — LAMOTRIGINE 150 MG PO TABS: 150.0000 mg | ORAL_TABLET | Freq: Every day | ORAL | 2 refills | Status: DC

## 2016-12-26 MED ORDER — BUSPIRONE HCL 10 MG PO TABS: 10.0000 mg | ORAL_TABLET | Freq: Two times a day (BID) | ORAL | 1 refills | Status: DC

## 2016-12-26 MED ORDER — SERTRALINE HCL 100 MG PO TABS: 100.0000 mg | ORAL_TABLET | Freq: Every day | ORAL | 0 refills | Status: DC

## 2016-12-26 NOTE — Progress Notes (Signed)
BH MD/PA/NP OP Progress Note  12/26/2016 9:08 AM Toni Green  MRN:  [id number]  Subjective:    Patient is a 56 year old married female who presented for follow-up.  She reported that she did use to have severe anxiety. She reported that she does not want to go to work due to worsening of her anxiety and she is finally over the weekend. We discussed about the reasons for anxiety. It appears that patient has social anxiety as she enjoys coloring books. Patient reported that she has been taking propranolol which has been helpful. We discussed about adjusting her medications as she has been taking Pristiq 100 mg for a while a long time and she might have developed tolerance to the medication. She became apprehensive about changing her medications. Her husband remains supportive. Patient currently denied having any suicidal ideations or plans. She reported that she has been compliant with her medications. She brought bottles  of her medications with her.  She later agreed to adjusting of her current medications. She currently denied using any drugs or alcohol. She reported that she has been sleeping well at night.  Chief Complaint:   Visit Diagnosis:     ICD-9-CM ICD-10-CM   1. MDD (major depressive disorder), recurrent episode, moderate (HCC) 296.32 F33.1   2. Anxiety state 300.00 F41.1     Past Medical History:  Past Medical History:  Diagnosis Date  . Anxiety   . Arthritis    fingers  . Bursitis    hips  . Claustrophobia   . Depression   . Hypertension   . Kidney stones   . Motion sickness    back seat cars  . Seizures ([place]) 1985   x1, after labor/childbirth  . Thyroid disease     Past Surgical History:  Procedure Laterality Date  . ABDOMINAL HYSTERECTOMY    . APPENDECTOMY    . BACK SURGERY  10/2005   lumbar  . COLONOSCOPY WITH PROPOFOL N/A 09/23/2016   Procedure: COLONOSCOPY WITH PROPOFOL;  Surgeon: Lucilla Lame, MD;  Location: [facility];  Service: Endoscopy;   Laterality: N/A;  . GALLBLADDER SURGERY    . GASTRIC BYPASS  2008  . POLYPECTOMY  09/23/2016   Procedure: POLYPECTOMY;  Surgeon: Lucilla Lame, MD;  Location: [facility];  Service: Endoscopy;;   Family History:  Family History  Problem Relation Age of Onset  . Depression Mother   . Hypertension Mother   . Depression Father   . Hypertension Father   . Depression Sister   . Hypertension Maternal Grandfather   . Depression Maternal Grandmother   . Hypertension Maternal Grandmother   . Hypertension Paternal Grandfather   . Hypertension Paternal Grandmother    Social History:  Social History   Social History  . Marital status: Married    Spouse name: N/A  . Number of children: N/A  . Years of education: N/A   Social History Main Topics  . Smoking status: Never Smoker  . Smokeless tobacco: Never Used  . Alcohol use No  . Drug use: No  . Sexual activity: Not Currently   Other Topics Concern  . Not on file   Social History Narrative  . No narrative on file   Additional History:  Lives with her husband and has been working full-time in [place]. She denied use of drugs or alcohol at this time.  Assessment:   Musculoskeletal: Strength & Muscle Tone: within normal limits Gait & Station: normal Patient leans: N/A  Psychiatric Specialty Exam:  Medication Refill  Pertinent negatives include no neck pain.  Anxiety  Symptoms include insomnia and nervous/anxious behavior. Patient reports no suicidal ideas.      Review of Systems  Musculoskeletal: Negative for neck pain.  Psychiatric/Behavioral: Negative for depression, hallucinations, memory loss and suicidal ideas. Substance abuse: overuse of Klonopin as discussed above. The patient is nervous/anxious and has insomnia.     There were no vitals taken for this visit.There is no height or weight on file to calculate BMI.  General Appearance: Fairly Groomed  Eye Contact:  Fair  Speech:  Normal Rate  Volume:  Normal   Mood:  Euthymic  Affect:  Congruent  Thought Process:  Coherent  Orientation:  Full (Time, Place, and Person)  Thought Content:  WDL  Suicidal Thoughts:  No  Homicidal Thoughts:  No  Memory:  Immediate;   Fair  Judgement:  Fair  Insight:  Fair  Psychomotor Activity:  Normal  Concentration:  Fair  Recall:  [employer] of Knowledge: Fair  Language: Fair  Akathisia:  No  Handed:  Right  AIMS (if indicated):  Assets:  Communication Skills Desire for Improvement Physical Health Social Support  ADL's:  Intact  Cognition: WNL  Sleep:     Is the patient at risk to self?  No. Has the patient been a risk to self in the past 6 months?  No. Has the patient been a risk to self within the distant past?  No. Is the patient a risk to others?  No. Has the patient been a risk to others in the past 6 months?  No. Has the patient been a risk to others within the distant past?  No.  Current Medications: Current Outpatient Prescriptions  Medication Sig Dispense Refill  . busPIRone (BUSPAR) 10 MG tablet Take 1 tablet (10 mg total) by mouth 2 (two) times daily. 180 tablet 1  . desvenlafaxine (PRISTIQ) 100 MG 24 hr tablet Take 1 tablet (100 mg total) by mouth daily. 90 tablet 2  . Ferrous Gluconate (IRON 27 PO) Take by mouth.    . furosemide (LASIX) 20 MG tablet TAKE 1 TABLET DAILY 90 tablet 3  . HYDROcodone-acetaminophen (NORCO/VICODIN) 5-325 MG tablet     . irbesartan (AVAPRO) 300 MG tablet Take 1 tablet (300 mg total) by mouth daily. 90 tablet 1  . lamoTRIgine (LAMICTAL) 150 MG tablet Take 1 tablet (150 mg total) by mouth daily. 90 tablet 2  . levothyroxine (SYNTHROID, LEVOTHROID) 75 MCG tablet Take 1 tablet (75 mcg total) by mouth daily before breakfast. 90 tablet 3  . Lysine HCl 1000 MG TABS Take by mouth.    . Magnesium 250 MG TABS Take by mouth.    . Na Sulfate-K Sulfate-Mg Sulf (SUPREP BOWEL PREP KIT) 17.5-3.13-1.6 GM/180ML SOLN Take 1 kit by mouth as directed. 1 Bottle 0  .  Nutritional Supplements (ESTROVEN PO) Take by mouth.    . prazosin (MINIPRESS) 5 MG capsule Take 1 capsule (5 mg total) by mouth at bedtime. 90 capsule 3  . propranolol (INDERAL) 10 MG tablet Take 1 tablet (10 mg total) by mouth 3 (three) times daily. 90 tablet 1  . tamsulosin (FLOMAX) 0.4 MG CAPS capsule Take 0.4 mg by mouth as needed.     . traZODone (DESYREL) 100 MG tablet Take 1 tablet (100 mg total) by mouth at bedtime. 90 tablet 1  . vitamin B-12 (CYANOCOBALAMIN) 250 MCG tablet Take 1 tablet (250 mcg total) by mouth daily.     No  current facility-administered medications for this visit.     Medical Decision Making:  Review of Psycho-Social Stressors (1) and Review of Last Therapy Session (1)  Treatment Plan Summary:Medication management    Depression I will decrease the dose of Pristiq 50 mg by mouth  daily. Start her on Zoloft 50 mg by mouth daily  Mood symptoms Patient will continue on lamotrigine 150 mg daily.  Continue  BuSpar 10 mg by mouth twice a day to help with her anxiety symptoms.   propanolol 10 mg by mouth 3 times a day when necessary for her anxiety. She agreed with the plan. She will continue on trazodone at bedtime.  Follow-up in 4 weeks or  earlier depending on her symptoms      More than 50% of the time spent in psychoeducation, counseling and coordination of care.    This note was generated in part or whole with voice recognition software. Voice regonition is usually quite accurate but there are transcription errors that can and very often do occur. I apologize for any typographical errors that were not detected and corrected.        Rainey Pines, MD    12/26/2016, 9:08 AM

## 2017-01-04 ENCOUNTER — Other Ambulatory Visit: Payer: Self-pay | Admitting: *Deleted

## 2017-01-04 ENCOUNTER — Other Ambulatory Visit: Payer: Self-pay | Admitting: Internal Medicine

## 2017-01-04 ENCOUNTER — Inpatient Hospital Stay
Admission: RE | Admit: 2017-01-04 | Discharge: 2017-01-04 | Disposition: A | Discharge status: Home / Self Care | Payer: Self-pay | Source: Ambulatory Visit | Attending: *Deleted | Admitting: *Deleted

## 2017-01-04 DIAGNOSIS — R928 Other abnormal and inconclusive findings on diagnostic imaging of breast: Secondary | ICD-10-CM

## 2017-01-04 DIAGNOSIS — Z1231 Encounter for screening mammogram for malignant neoplasm of breast: Secondary | ICD-10-CM

## 2017-01-04 DIAGNOSIS — N6489 Other specified disorders of breast: Secondary | ICD-10-CM

## 2017-01-20 ENCOUNTER — Ambulatory Visit: Payer: Self-pay | Admitting: Internal Medicine

## 2017-01-24 ENCOUNTER — Other Ambulatory Visit: Payer: Self-pay | Admitting: Psychiatry

## 2017-01-24 ENCOUNTER — Telehealth: Payer: Self-pay

## 2017-01-24 NOTE — Telephone Encounter (Signed)
Ok

## 2017-01-24 NOTE — Telephone Encounter (Signed)
Please call in 12 days worth of zoloft 100mg  (12 tablets) and Pristiq 50mg  (12 tablets).

## 2017-01-24 NOTE — Telephone Encounter (Signed)
pt called left message on voice mail that she needs a refill only on the zoloft 100mg  and the pristiq 50mg .  pt was seen on  12-26-16 and next appt is for 02-06-17 pt was not given enough medication to last until next appt.

## 2017-01-24 NOTE — Telephone Encounter (Signed)
Left message on doctor's line at pharmacy for medication zoloft 100mg  and pristig 50mg  #12 and no additional refills for either medications per your order.

## 2017-02-03 ENCOUNTER — Other Ambulatory Visit: Payer: Self-pay | Admitting: Physical Medicine and Rehabilitation

## 2017-02-03 ENCOUNTER — Ambulatory Visit (INDEPENDENT_AMBULATORY_CARE_PROVIDER_SITE_OTHER): Payer: 59 | Admitting: Internal Medicine

## 2017-02-03 ENCOUNTER — Encounter: Payer: Self-pay | Admitting: Internal Medicine

## 2017-02-03 VITALS — BP 126/74 | HR 78 | Ht 70.0 in | Wt 191.2 lb

## 2017-02-03 DIAGNOSIS — I10 Essential (primary) hypertension: Secondary | ICD-10-CM | POA: Diagnosis not present

## 2017-02-03 DIAGNOSIS — E033 Postinfectious hypothyroidism: Secondary | ICD-10-CM

## 2017-02-03 DIAGNOSIS — M5416 Radiculopathy, lumbar region: Secondary | ICD-10-CM

## 2017-02-03 DIAGNOSIS — F3341 Major depressive disorder, recurrent, in partial remission: Secondary | ICD-10-CM

## 2017-02-03 MED ORDER — FUROSEMIDE 20 MG PO TABS: 20.0000 mg | ORAL_TABLET | Freq: Every day | ORAL | 3 refills | Status: DC

## 2017-02-03 NOTE — Progress Notes (Signed)
  Date:  02/03/2017   Name:  Toni Green   DOB:  02/11/1961   MRN:  [id number]   Chief Complaint: Hypertension Hypertension  This is a chronic problem. The problem is unchanged. The problem is controlled. Pertinent negatives include no chest pain, headaches, palpitations or shortness of breath. Past treatments include diuretics, angiotensin blockers, alpha 1 blockers and beta blockers. Identifiable causes of hypertension include a thyroid problem.  Thyroid Problem  Presents for follow-up visit. Symptoms include leg swelling and weight gain. Patient reports no anxiety, constipation, diarrhea, fatigue, hair loss, heat intolerance or palpitations. The symptoms have been improving (since thyroid dose increased).  Anxiety/depression - doing very well. Working with Psych to get medication adjusted.  Psych thinks she has more anxiety than depression so she cut back on Pristiq and added Sertraline.  Lab Results  Component Value Date   TSH 5.160 (H) 11/01/2016     Review of Systems  Constitutional: Positive for weight gain. Negative for chills and fatigue.  Respiratory: Negative for cough, chest tightness, shortness of breath and wheezing.   Cardiovascular: Positive for leg swelling. Negative for chest pain and palpitations.  Gastrointestinal: Negative for abdominal pain, constipation and diarrhea.  Endocrine: Negative for heat intolerance.  Skin: Negative for color change and rash.  Neurological: Negative for dizziness and headaches.  Psychiatric/Behavioral: Negative for dysphoric mood and sleep disturbance. The patient is not nervous/anxious.     Patient Active Problem List   Diagnosis Date Noted  . Special screening for malignant neoplasms, colon   . Benign neoplasm of ascending colon   . Polyp of sigmoid colon   . Menopause syndrome 07/26/2016  . Hypothyroidism 05/25/2016  . Essential (primary) hypertension 07/20/2015  . 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 disorder 07/20/2015  .  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 Severe episode of recurrent major depressive disorder, without psychotic features (HCC) 07/20/2015  . Borderline personality disorder 04/23/2015  . 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 Complication of surgery 02/20/2015  . Bursitis, trochanteric 10/01/2014  . Inflammation of sacroiliac joint (HCC) 08/22/2014  . 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 DDD (degenerative disc disease), cervical 03/17/2014  . Arthropathy of cervical facet joint (HCC) 03/17/2014  . Hyperoxaluria (HCC) 08/15/2013  . Calculus of kidney 02/13/2013    Prior to Admission medications   Medication Sig Start Date End Date Taking? Authorizing Provider  busPIRone (BUSPAR) 10 MG tablet Take 1 tablet (10 mg total) by mouth 2 (two) times daily. 12/26/16  Yes Uzma Faheem, MD  desvenlafaxine (PRISTIQ) 50 MG 24 hr tablet Take 1 tablet (50 mg total) by mouth daily. 12/26/16  Yes Uzma Faheem, MD  Ferrous Gluconate (IRON 27 PO) Take by mouth.   Yes Historical Provider, MD  furosemide (LASIX) 20 MG tablet TAKE 1 TABLET DAILY 02/12/16  Yes  H , MD  HYDROcodone-acetaminophen (NORCO/VICODIN) 5-325 MG tablet  03/08/16  Yes Historical Provider, MD  irbesartan (AVAPRO) 300 MG tablet Take 1 tablet (300 mg total) by mouth daily. 11/01/16  Yes  H , MD  lamoTRIgine (LAMICTAL) 150 MG tablet Take 1 tablet (150 mg total) by mouth daily. 12/26/16  Yes Uzma Faheem, MD  levothyroxine (SYNTHROID, LEVOTHROID) 75 MCG tablet Take 1 tablet (75 mcg total) by mouth daily before breakfast. 11/03/16  Yes  H , MD  Lysine HCl 1000 MG TABS Take by mouth.   Yes Historical Provider, MD  Magnesium 250 MG TABS Take by mouth.   Yes Historical Provider, MD    Na Sulfate-K Sulfate-Mg Sulf (SUPREP BOWEL PREP KIT) 17.5-3.13-1.6 GM/180ML SOLN Take 1 kit by mouth as directed. 09/20/16  Yes Darren Wohl, MD  Nutritional Supplements (ESTROVEN PO) Take by mouth.   Yes Historical  Provider, MD  prazosin (MINIPRESS) 5 MG capsule Take 1 capsule (5 mg total) by mouth at bedtime. 08/26/16  Yes  H , MD  propranolol (INDERAL) 10 MG tablet Take 1 tablet (10 mg total) by mouth 3 (three) times daily. 12/26/16  Yes Uzma Faheem, MD  sertraline (ZOLOFT) 100 MG tablet Take 1 tablet (100 mg total) by mouth daily. 12/26/16  Yes Uzma Faheem, MD  tamsulosin (FLOMAX) 0.4 MG CAPS capsule Take 0.4 mg by mouth as needed.  06/28/13  Yes Historical Provider, MD  traZODone (DESYREL) 100 MG tablet Take 1 tablet (100 mg total) by mouth at bedtime. 12/26/16  Yes Uzma Faheem, MD  vitamin B-12 (CYANOCOBALAMIN) 250 MCG tablet Take 1 tablet (250 mcg total) by mouth daily. 02/23/16  Yes Uzma Faheem, MD    Allergies  Allergen Reactions  . Losartan Palpitations    Could feel heartbeat (strongly) in neck  . Oxycodone-Acetaminophen Itching       . Penicillins Rash  . Tape Rash    Skin irritation after back surgery    Past Surgical History:  Procedure Laterality Date  . ABDOMINAL HYSTERECTOMY    . APPENDECTOMY    . BACK SURGERY  10/2005   lumbar  . COLONOSCOPY WITH PROPOFOL N/A 09/23/2016   Procedure: COLONOSCOPY WITH PROPOFOL;  Surgeon: Darren Wohl, MD;  Location: [facility];  Service: Endoscopy;  Laterality: N/A;  . GALLBLADDER SURGERY    . GASTRIC BYPASS  2008  . POLYPECTOMY  09/23/2016   Procedure: POLYPECTOMY;  Surgeon: Darren Wohl, MD;  Location: [facility];  Service: Endoscopy;;    Social History  Substance Use Topics  . Smoking status: Never Smoker  . Smokeless tobacco: Never Used  . Alcohol use No   Depression screen PHQ 2/9 02/03/2017 11/01/2016  Decreased Interest 1 0  Down, Depressed, Hopeless 0 0  PHQ - 2 Score 1 0     Medication list has been reviewed and updated.   Physical Exam  Constitutional: She is oriented to person, place, and time. She appears well-developed. No distress.  HENT:  Head: Normocephalic and atraumatic.  Neck: Normal  range of motion. Neck supple. No thyromegaly present.  Cardiovascular: Normal rate, regular rhythm and normal heart sounds.   Pulmonary/Chest: Effort normal and breath sounds normal. No respiratory distress. She has no wheezes.  Abdominal: Soft.  Musculoskeletal: Normal range of motion. She exhibits edema (trace ankle).  Neurological: She is alert and oriented to person, place, and time.  Skin: Skin is warm and dry. No rash noted.  Psychiatric: She has a normal mood and affect. Her behavior is normal. Thought content normal.  Nursing note and vitals reviewed.   BP 126/74 (BP Location: Right Arm, Patient Position: Sitting, Cuff Size: Normal)   Pulse 78   Ht 5' 10" (1.778 m)   Wt 191 lb 3.2 oz (86.7 kg)   SpO2 97%   BMI 27.43 kg/m   Assessment and Plan: 1. Essential (primary) hypertension controlled - furosemide (LASIX) 20 MG tablet; Take 1 tablet (20 mg total) by mouth daily.  Dispense: 90 tablet; Refill: 3  2. Postinfectious hypothyroidism Continue supplementation - TSH  3. Recurrent major depressive disorder, in partial remission (HCC) Doing well; continue meds and Psych follow up  Meds ordered this encounter    Medications  . furosemide (LASIX) 20 MG tablet    Sig: Take 1 tablet (20 mg total) by mouth daily.    Dispense:  90 tablet    Refill:  [place], MD [facility]  02/03/2017

## 2017-02-04 LAB — TSH: TSH: 1.55 u[IU]/mL (ref 0.450–4.500)

## 2017-02-06 ENCOUNTER — Ambulatory Visit (INDEPENDENT_AMBULATORY_CARE_PROVIDER_SITE_OTHER): Payer: 59 | Admitting: Psychiatry

## 2017-02-06 ENCOUNTER — Ambulatory Visit
Admission: RE | Admit: 2017-02-06 | Discharge: 2017-02-06 | Disposition: A | Discharge status: Home / Self Care | Payer: 59 | Source: Ambulatory Visit | Attending: Internal Medicine | Admitting: Internal Medicine

## 2017-02-06 ENCOUNTER — Encounter: Payer: Self-pay | Admitting: Psychiatry

## 2017-02-06 VITALS — BP 112/78 | HR 56 | Temp 97.7°F | Wt 189.8 lb

## 2017-02-06 DIAGNOSIS — R928 Other abnormal and inconclusive findings on diagnostic imaging of breast: Secondary | ICD-10-CM | POA: Insufficient documentation

## 2017-02-06 DIAGNOSIS — F331 Major depressive disorder, recurrent, moderate: Secondary | ICD-10-CM

## 2017-02-06 DIAGNOSIS — N6489 Other specified disorders of breast: Secondary | ICD-10-CM | POA: Diagnosis present

## 2017-02-06 DIAGNOSIS — F411 Generalized anxiety disorder: Secondary | ICD-10-CM

## 2017-02-06 MED ORDER — MIRTAZAPINE 15 MG PO TABS: 15.0000 mg | ORAL_TABLET | Freq: Every day | ORAL | 1 refills | Status: DC

## 2017-02-06 MED ORDER — TRAZODONE HCL 100 MG PO TABS: 100.0000 mg | ORAL_TABLET | Freq: Every day | ORAL | 1 refills | Status: DC

## 2017-02-06 MED ORDER — SERTRALINE HCL 100 MG PO TABS: 100.0000 mg | ORAL_TABLET | Freq: Every day | ORAL | 0 refills | Status: DC

## 2017-02-06 NOTE — Progress Notes (Signed)
BH MD/PA/NP OP Progress Note  02/06/2017 9:40 AM Toni Green  MRN:  [id number]  Subjective:    Patient is a 56 year-old married female who presented for follow-up accompanied by her husband. Patient reported that she continues to have anxiety and depression but her anxiety is improving at this time. Patient has decreased the dose of Pristiq to 50 mg. She also called  in for a refill of her medications recently. Patient reported that she has been having issues with her sleep and she has been taking over-the-counter melatonin 60 mg as well as Goody p.m. in addition to the trazodone which has been prescribed. She continues to be addicted to the sleep medication as she reported that she is having issues with the sleep. We discussed about adjusting her medications and she agreed with the plan. She appeared apprehensive and during the interview as she reported that she feels more anxious in the morning after taking so many sleeping pills. Her husband remains supportive. She denied having any suicidal homicidal ideation or plan.   Discussed with patient about adjusting her medications and she remains receptive to the change. She denied having any use of drugs or alcohol.   She brought bottles  of her medications with her.   Chief Complaint:  Chief Complaint    Follow-up; Medication Refill     Visit Diagnosis:     ICD-9-CM ICD-10-CM   1. MDD (major depressive disorder), recurrent episode, moderate (HCC) 296.32 F33.1   2. Anxiety state 300.00 F41.1     Past Medical History:  Past Medical History:  Diagnosis Date  . Anxiety   . Arthritis    fingers  . Bursitis    hips  . Cancer ([place] [address])    skin ca  . Claustrophobia   . Depression   . Hypertension   . Kidney stones   . Motion sickness    back seat cars  . Seizures ([place]) 1985   x1, after labor/childbirth  . Thyroid disease     Past Surgical History:  Procedure Laterality Date  . ABDOMINAL HYSTERECTOMY    . APPENDECTOMY    . BACK  SURGERY  10/2005   lumbar  . COLONOSCOPY WITH PROPOFOL N/A 09/23/2016   Procedure: COLONOSCOPY WITH PROPOFOL;  Surgeon: Lucilla Lame, MD;  Location: [facility];  Service: Endoscopy;  Laterality: N/A;  . GALLBLADDER SURGERY    . GASTRIC BYPASS  2008  . POLYPECTOMY  09/23/2016   Procedure: POLYPECTOMY;  Surgeon: Lucilla Lame, MD;  Location: [facility];  Service: Endoscopy;;   Family History:  Family History  Problem Relation Age of Onset  . Depression Mother   . Hypertension Mother   . Depression Father   . Hypertension Father   . Depression Sister   . Breast cancer Sister 7  . Hypertension Maternal Grandfather   . Depression Maternal Grandmother   . Hypertension Maternal Grandmother   . Hypertension Paternal Grandfather   . Hypertension Paternal Grandmother    Social History:  Social History   Social History  . Marital status: Married    Spouse name: N/A  . Number of children: N/A  . Years of education: N/A   Social History Main Topics  . Smoking status: Never Smoker  . Smokeless tobacco: Never Used  . Alcohol use No  . Drug use: No  . Sexual activity: Not Currently   Other Topics Concern  . None   Social History Narrative  . None   Additional History:  Lives with her husband and has been working full-time in [place]. She denied use of drugs or alcohol at this time.  Assessment:   Musculoskeletal: Strength & Muscle Tone: within normal limits Gait & Station: normal Patient leans: N/A  Psychiatric Specialty Exam: Medication Refill  Pertinent negatives include no neck pain.  Anxiety  Symptoms include insomnia and nervous/anxious behavior. Patient reports no suicidal ideas.      Review of Systems  Musculoskeletal: Negative for neck pain.  Psychiatric/Behavioral: Negative for depression, hallucinations, memory loss and suicidal ideas. Substance abuse: overuse of Klonopin as discussed above. The patient is nervous/anxious and has insomnia.      Blood pressure 112/78, pulse (!) 56, temperature 97.7 F (36.5 C), temperature source Oral, weight 189 lb 12.8 oz (86.1 kg).Body mass index is 27.23 kg/m.  General Appearance: Fairly Groomed  Eye Contact:  Fair  Speech:  Normal Rate  Volume:  Normal  Mood:  Euthymic  Affect:  Congruent  Thought Process:  Coherent  Orientation:  Full (Time, Place, and Person)  Thought Content:  WDL  Suicidal Thoughts:  No  Homicidal Thoughts:  No  Memory:  Immediate;   Fair  Judgement:  Fair  Insight:  Fair  Psychomotor Activity:  Normal  Concentration:  Fair  Recall:  [employer] of Knowledge: Fair  Language: Fair  Akathisia:  No  Handed:  Right  AIMS (if indicated):  Assets:  Communication Skills Desire for Improvement Physical Health Social Support  ADL's:  Intact  Cognition: WNL  Sleep:     Is the patient at risk to self?  No. Has the patient been a risk to self in the past 6 months?  No. Has the patient been a risk to self within the distant past?  No. Is the patient a risk to others?  No. Has the patient been a risk to others in the past 6 months?  No. Has the patient been a risk to others within the distant past?  No.  Current Medications: Current Outpatient Prescriptions  Medication Sig Dispense Refill  . busPIRone (BUSPAR) 10 MG tablet Take 1 tablet (10 mg total) by mouth 2 (two) times daily. 180 tablet 1  . Ferrous Gluconate (IRON 27 PO) Take by mouth.    . furosemide (LASIX) 20 MG tablet Take 1 tablet (20 mg total) by mouth daily. 90 tablet 3  . HYDROcodone-acetaminophen (NORCO/VICODIN) 5-325 MG tablet     . irbesartan (AVAPRO) 300 MG tablet Take 1 tablet (300 mg total) by mouth daily. 90 tablet 1  . lamoTRIgine (LAMICTAL) 150 MG tablet Take 1 tablet (150 mg total) by mouth daily. 90 tablet 2  . levothyroxine (SYNTHROID, LEVOTHROID) 75 MCG tablet Take 1 tablet (75 mcg total) by mouth daily before breakfast. 90 tablet 3  . Lysine HCl 1000 MG TABS Take by mouth.    .  Magnesium 250 MG TABS Take by mouth.    . Na Sulfate-K Sulfate-Mg Sulf (SUPREP BOWEL PREP KIT) 17.5-3.13-1.6 GM/180ML SOLN Take 1 kit by mouth as directed. 1 Bottle 0  . Nutritional Supplements (ESTROVEN PO) Take by mouth.    . prazosin (MINIPRESS) 5 MG capsule Take 1 capsule (5 mg total) by mouth at bedtime. 90 capsule 3  . propranolol (INDERAL) 10 MG tablet Take 1 tablet (10 mg total) by mouth 3 (three) times daily. 90 tablet 1  . sertraline (ZOLOFT) 100 MG tablet Take 1 tablet (100 mg total) by mouth daily. 90 tablet 0  . tamsulosin (FLOMAX) 0.4  MG CAPS capsule Take 0.4 mg by mouth as needed.     . traZODone (DESYREL) 100 MG tablet Take 1 tablet (100 mg total) by mouth at bedtime. 90 tablet 1  . vitamin B-12 (CYANOCOBALAMIN) 250 MCG tablet Take 1 tablet (250 mcg total) by mouth daily.    . mirtazapine (REMERON) 15 MG tablet Take 1 tablet (15 mg total) by mouth at bedtime. 30 tablet 1   No current facility-administered medications for this visit.     Medical Decision Making:  Review of Psycho-Social Stressors (1) and Review of Last Therapy Session (1)  Treatment Plan Summary:Medication management    D/c pristiq  Continue  Zoloft 100  mg by mouth daily Patient will continue on lamotrigine 150 mg daily.  Continue  BuSpar 10 mg by mouth twice a day to help with her anxiety symptoms.   propanolol 10 mg by mouth 2  times a day when necessary for her anxiety. She agreed with the plan. She will continue on trazodone at bedtime. I will start her on Remeron 15 mg by mouth daily at bedtime to help with her depression as well as insomnia. Advised patient to stop taking the following medications Melatonin and goody  p.m. and she agreed with the plan.  Follow-up in 2 months  or  earlier depending on her symptoms    More than 50% of the time spent in psychoeducation, counseling and coordination of care.    This note was generated in part or whole with voice recognition software. Voice  regonition is usually quite accurate but there are transcription errors that can and very often do occur. I apologize for any typographical errors that were not detected and corrected.        Rainey Pines, MD    02/06/2017, 9:40 AM

## 2017-02-13 ENCOUNTER — Telehealth: Payer: Self-pay

## 2017-02-13 NOTE — Telephone Encounter (Signed)
Ask her to stop Mirtazapine and call back next week regarding her symptoms

## 2017-02-13 NOTE — Telephone Encounter (Signed)
pt called left messages that she wanted to stop the sertraline. pt states she has gained another 5 pounds in a week with the dosage increase.  pt wasnts to go on something else or wanted to see if she can do the pristiq  Pt was last seen on  02-06-17 next appt 04-05-17

## 2017-02-13 NOTE — Telephone Encounter (Signed)
Change sertraline 50mg  po qam

## 2017-02-13 NOTE — Telephone Encounter (Signed)
Called pt and gave her your instructions.  Pt states that she knows it is the sertraline because she wasn't like that until she increased medication and that she not going to stop the mirtazapine because she is already having a hard time going to sleep but if she stops that she will not sleep at all.  Pt would like it if you called her back.

## 2017-02-14 ENCOUNTER — Ambulatory Visit
Admission: RE | Admit: 2017-02-14 | Discharge: 2017-02-14 | Disposition: A | Discharge status: Home / Self Care | Payer: 59 | Source: Ambulatory Visit | Attending: Physical Medicine and Rehabilitation | Admitting: Physical Medicine and Rehabilitation

## 2017-02-14 DIAGNOSIS — M5416 Radiculopathy, lumbar region: Secondary | ICD-10-CM | POA: Diagnosis present

## 2017-02-14 DIAGNOSIS — M48061 Spinal stenosis, lumbar region without neurogenic claudication: Secondary | ICD-10-CM | POA: Diagnosis not present

## 2017-02-16 NOTE — Telephone Encounter (Signed)
PT STATES SHE IS NOT GOING TO TAKE THE SERTRAILINE SHE STATES IT IS MAKING HER GAIN WEIGHT AND PT STATES THAT HER LAST DOSAGE WAS ON Sunday  AND THE MIRTAZAPINE HER LAST 02-15-17 AND THAT SHE STARTED TAKING PRISTIQ THAT SHE HAD LEFT OVER.  PT STATES THAT THE MIRTAZAPINE IS NOT HELPING WITH THE SLEEP AND SHE ALSO FEELING LIKE IT MAKES HER GAIN WEIGHT SO SHE HAS STOPPED TAKEN ALSO.    PLEASE ADVISE PT

## 2017-03-23 ENCOUNTER — Telehealth: Payer: Self-pay

## 2017-03-23 NOTE — Telephone Encounter (Signed)
pt called states that she needs enough medication to get to her appt on 04-05-17. pt needs buspirone , desvenlafaxin 100mg  and trazodone 100mg .

## 2017-03-24 ENCOUNTER — Telehealth: Payer: Self-pay | Admitting: Psychiatry

## 2017-03-24 ENCOUNTER — Other Ambulatory Visit (HOSPITAL_COMMUNITY): Payer: Self-pay | Admitting: Psychiatry

## 2017-03-24 MED ORDER — BUSPIRONE HCL 10 MG PO TABS: 10.0000 mg | ORAL_TABLET | Freq: Two times a day (BID) | ORAL | 1 refills | Status: DC

## 2017-03-24 NOTE — Telephone Encounter (Signed)
called in per dr. Einar Grad order the trazodone 100mg  #30 with no additional refills and then called in the and the pristiq desvenlafaxine 100mg  #30 with no additional refills per Dr. Einar Grad over the phone verbal ok at  12-05 on  03-24-17.

## 2017-03-24 NOTE — Telephone Encounter (Signed)
Ok, I sent it to pharmacy

## 2017-03-24 NOTE — Telephone Encounter (Signed)
spoke with patient told pt that the trazodone and pristiq  [facility] the buspar dr.ravi sent to [facility].

## 2017-03-24 NOTE — Telephone Encounter (Signed)
pt had called office states that medication had not been sent in yet.

## 2017-03-24 NOTE — Telephone Encounter (Signed)
i was working in the [employer] office so i called dr. Einar Grad and she i asked her because her message back was she had done this but when i went into system the only one done was the [facility]par that [facility]. I asked dr. Einar Grad if she was going to send the other two medication or what did she want to do about that.  She states to just call in a month supply of each.  The  Trazodone and the desveulate

## 2017-03-28 NOTE — Telephone Encounter (Signed)
What medication

## 2017-04-05 ENCOUNTER — Encounter: Payer: Self-pay | Admitting: Psychiatry

## 2017-04-05 ENCOUNTER — Ambulatory Visit (INDEPENDENT_AMBULATORY_CARE_PROVIDER_SITE_OTHER): Payer: 59 | Admitting: Psychiatry

## 2017-04-05 VITALS — BP 134/94 | HR 74 | Wt 183.6 lb

## 2017-04-05 DIAGNOSIS — F411 Generalized anxiety disorder: Secondary | ICD-10-CM

## 2017-04-05 DIAGNOSIS — F331 Major depressive disorder, recurrent, moderate: Secondary | ICD-10-CM

## 2017-04-05 MED ORDER — BUSPIRONE HCL 10 MG PO TABS: 10.0000 mg | ORAL_TABLET | Freq: Two times a day (BID) | ORAL | 1 refills | Status: DC

## 2017-04-05 MED ORDER — TRAZODONE HCL 100 MG PO TABS: 100.0000 mg | ORAL_TABLET | Freq: Every day | ORAL | 1 refills | Status: DC

## 2017-04-05 MED ORDER — DESVENLAFAXINE SUCCINATE ER 100 MG PO TB24: 100.0000 mg | ORAL_TABLET | Freq: Every day | ORAL | 3 refills | Status: DC

## 2017-04-05 MED ORDER — PROPRANOLOL HCL 10 MG PO TABS: 10.0000 mg | ORAL_TABLET | Freq: Two times a day (BID) | ORAL | 1 refills | Status: DC

## 2017-04-05 MED ORDER — LAMOTRIGINE 150 MG PO TABS: 150.0000 mg | ORAL_TABLET | Freq: Every day | ORAL | 2 refills | Status: DC

## 2017-04-05 NOTE — Progress Notes (Signed)
[facility] MD/PA/NP OP Progress Note  04/05/2017 1:43 PM Toni Green  MRN:  [id number]  Subjective:    Patient is a 56 year-old married female who presented for follow-up  Patient reported that she has stopped taking the Remeron as she gained weight on the medication. She is again taking the Pristiq. She reported that she is doing well on her medication. She reported that the propranolol has been helping her and her anxiety is under control. Patient currently denied having any side effects the medication. She appeared calm during the interview and reported that she has been feeling well on the current combination of the medication and she does not have any anxiety or paranoia. We discussed about her medications in detail. She reported that she does not want to change her medications at this time.     Chief Complaint:  Chief Complaint    Follow-up; Medication Refill     Visit Diagnosis:     ICD-10-CM   1. MDD (major depressive disorder), recurrent episode, moderate (HCC) F33.1   2. Anxiety state F41.1     Past Medical History:  Past Medical History:  Diagnosis Date  . Anxiety   . Arthritis    fingers  . Bursitis    hips  . Cancer ([place])    skin ca  . Claustrophobia   . Depression   . Hypertension   . Kidney stones   . Motion sickness    back seat cars  . Seizures ([place]) 1985   x1, after labor/childbirth  . Thyroid disease     Past Surgical History:  Procedure Laterality Date  . ABDOMINAL HYSTERECTOMY    . APPENDECTOMY    . BACK SURGERY  10/2005   lumbar  . COLONOSCOPY WITH PROPOFOL N/A 09/23/2016   Procedure: COLONOSCOPY WITH PROPOFOL;  Surgeon: Lucilla Lame, MD;  Location: [facility];  Service: Endoscopy;  Laterality: N/A;  . GALLBLADDER SURGERY    . GASTRIC BYPASS  2008  . POLYPECTOMY  09/23/2016   Procedure: POLYPECTOMY;  Surgeon: Lucilla Lame, MD;  Location: [facility];  Service: Endoscopy;;   Family History:  Family History  Problem Relation Age of  Onset  . Depression Mother   . Hypertension Mother   . Depression Father   . Hypertension Father   . Depression Sister   . Breast cancer Sister 74  . Hypertension Maternal Grandfather   . Depression Maternal Grandmother   . Hypertension Maternal Grandmother   . Hypertension Paternal Grandfather   . Hypertension Paternal Grandmother    Social History:  Social History   Social History  . Marital status: Married    Spouse name: N/A  . Number of children: N/A  . Years of education: N/A   Social History Main Topics  . Smoking status: Never Smoker  . Smokeless tobacco: Never Used  . Alcohol use No  . Drug use: No  . Sexual activity: Not Currently   Other Topics Concern  . None   Social History Narrative  . None   Additional History:  Lives with her husband and has been working full-time in [place]. She denied use of drugs or alcohol at this time.  Assessment:   Musculoskeletal: Strength & Muscle Tone: within normal limits Gait & Station: normal Patient leans: N/A  Psychiatric Specialty Exam: Medication Refill  Pertinent negatives include no neck pain.  Anxiety  Symptoms include insomnia and nervous/anxious behavior. Patient reports no suicidal ideas.      Review of Systems  Musculoskeletal: Negative for neck pain.  Psychiatric/Behavioral: Negative for depression, hallucinations, memory loss and suicidal ideas. Substance abuse: overuse of Klonopin as discussed above. The patient is nervous/anxious and has insomnia.     Blood pressure (!) 134/94, pulse 74, weight 183 lb 9.6 oz (83.3 kg).Body mass index is 26.34 kg/m.  General Appearance: Fairly Groomed  Eye Contact:  Fair  Speech:  Normal Rate  Volume:  Normal  Mood:  Euthymic  Affect:  Congruent  Thought Process:  Coherent  Orientation:  Full (Time, Place, and Person)  Thought Content:  WDL  Suicidal Thoughts:  No  Homicidal Thoughts:  No  Memory:  Immediate;   Fair  Judgement:  Fair  Insight:  Fair   Psychomotor Activity:  Normal  Concentration:  Fair  Recall:  [employer] of Knowledge: Fair  Language: Fair  Akathisia:  No  Handed:  Right  AIMS (if indicated):  Assets:  Communication Skills Desire for Improvement Physical Health Social Support  ADL's:  Intact  Cognition: WNL  Sleep:     Is the patient at risk to self?  No. Has the patient been a risk to self in the past 6 months?  No. Has the patient been a risk to self within the distant past?  No. Is the patient a risk to others?  No. Has the patient been a risk to others in the past 6 months?  No. Has the patient been a risk to others within the distant past?  No.  Current Medications: Current Outpatient Prescriptions  Medication Sig Dispense Refill  . busPIRone (BUSPAR) 10 MG tablet Take 1 tablet (10 mg total) by mouth 2 (two) times daily. 180 tablet 1  . Ferrous Gluconate (IRON 27 PO) Take by mouth.    . furosemide (LASIX) 20 MG tablet Take 1 tablet (20 mg total) by mouth daily. 90 tablet 3  . HYDROcodone-acetaminophen (NORCO/VICODIN) 5-325 MG tablet     . irbesartan (AVAPRO) 300 MG tablet Take 1 tablet (300 mg total) by mouth daily. 90 tablet 1  . lamoTRIgine (LAMICTAL) 150 MG tablet Take 1 tablet (150 mg total) by mouth daily. 90 tablet 2  . levothyroxine (SYNTHROID, LEVOTHROID) 75 MCG tablet Take 1 tablet (75 mcg total) by mouth daily before breakfast. 90 tablet 3  . Lysine HCl 1000 MG TABS Take by mouth.    . Magnesium 250 MG TABS Take by mouth.    . Na Sulfate-K Sulfate-Mg Sulf (SUPREP BOWEL PREP KIT) 17.5-3.13-1.6 GM/180ML SOLN Take 1 kit by mouth as directed. 1 Bottle 0  . Nutritional Supplements (ESTROVEN PO) Take by mouth.    . prazosin (MINIPRESS) 5 MG capsule Take 1 capsule (5 mg total) by mouth at bedtime. 90 capsule 3  . propranolol (INDERAL) 10 MG tablet Take 1 tablet (10 mg total) by mouth 2 (two) times daily. 180 tablet 1  . tamsulosin (FLOMAX) 0.4 MG CAPS capsule Take 0.4 mg by mouth as needed.     .  traZODone (DESYREL) 100 MG tablet Take 1 tablet (100 mg total) by mouth at bedtime. 90 tablet 1  . vitamin B-12 (CYANOCOBALAMIN) 250 MCG tablet Take 1 tablet (250 mcg total) by mouth daily.    Marland Kitchen desvenlafaxine (PRISTIQ) 100 MG 24 hr tablet Take 1 tablet (100 mg total) by mouth daily. 90 tablet 3   No current facility-administered medications for this visit.     Medical Decision Making:  Review of Psycho-Social Stressors (1) and Review of Last Therapy Session (1)  Treatment Plan Summary:Medication management    D/c remeron  Continue  Zoloft 100  mg by mouth daily Patient will continue on lamotrigine 150 mg daily.  Continue  BuSpar 10 mg by mouth twice a day to help with her anxiety symptoms.   propanolol 10 mg by mouth 2  times a day when necessary for her anxiety. She agreed with the plan. She will continue on trazodone at bedtime. Continue Pristiq 100 mg daily   Follow-up in 3 months  or  earlier depending on her symptoms    More than 50% of the time spent in psychoeducation, counseling and coordination of care.    This note was generated in part or whole with voice recognition software. Voice regonition is usually quite accurate but there are transcription errors that can and very often do occur. I apologize for any typographical errors that were not detected and corrected.        Rainey Pines, MD    04/05/2017, 1:43 PM

## 2017-05-11 ENCOUNTER — Other Ambulatory Visit: Payer: Self-pay | Admitting: Internal Medicine

## 2017-05-11 DIAGNOSIS — I10 Essential (primary) hypertension: Secondary | ICD-10-CM

## 2017-07-03 ENCOUNTER — Ambulatory Visit: Payer: 59 | Admitting: Psychiatry

## 2017-07-30 IMAGING — MG MM DIGITAL DIAGNOSTIC UNILAT*R* W/ TOMO W/ CAD
6 series · 6 of 14 positions shown · non-contrast
Comparison: Previous exam(s).

CLINICAL DATA: Possible asymmetry in the upper outer right breast
on a recent 2D screening mammogram.

EXAM:
2D DIGITAL DIAGNOSTIC UNILATERAL RIGHT MAMMOGRAM WITH CAD AND
ADJUNCT TOMO

[R MLO]
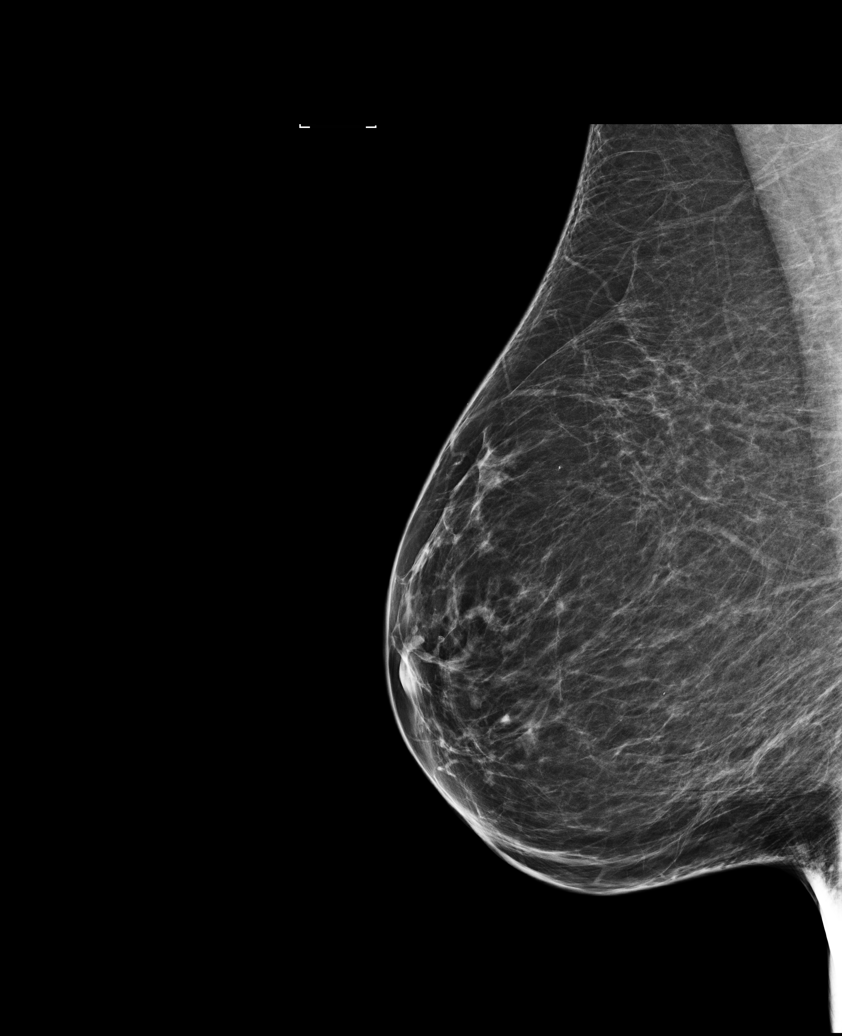

[R CC]
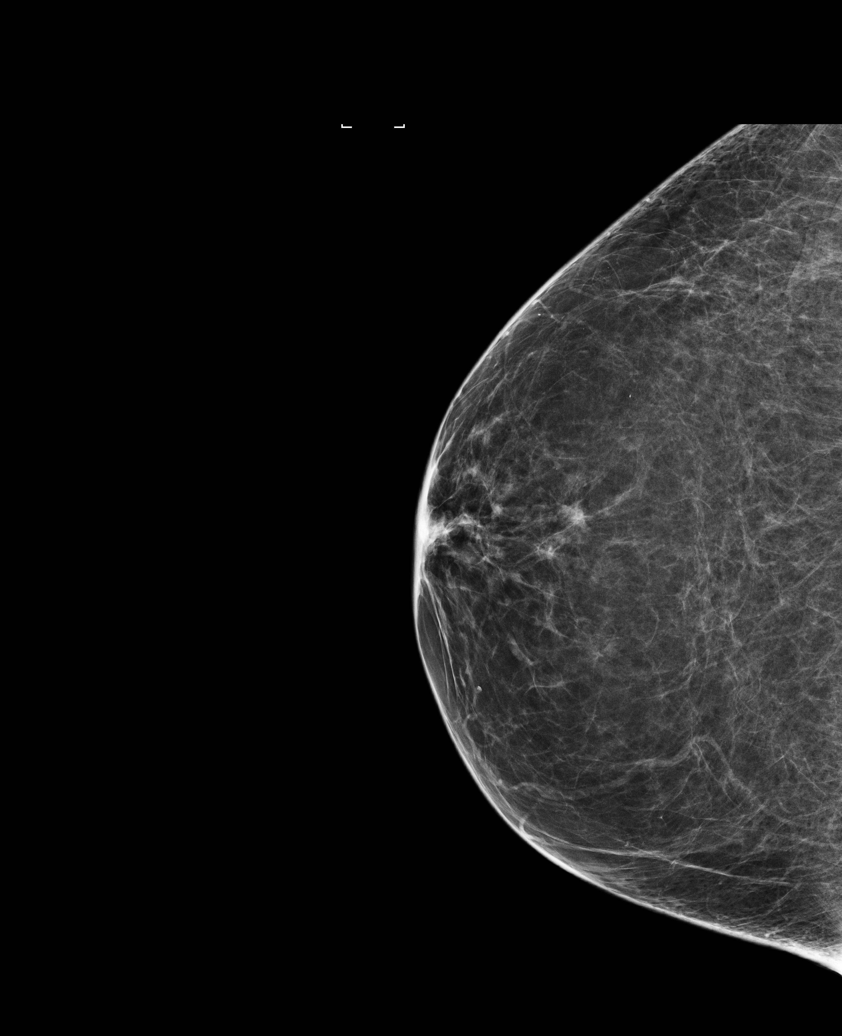

[R MLO synth-2D]
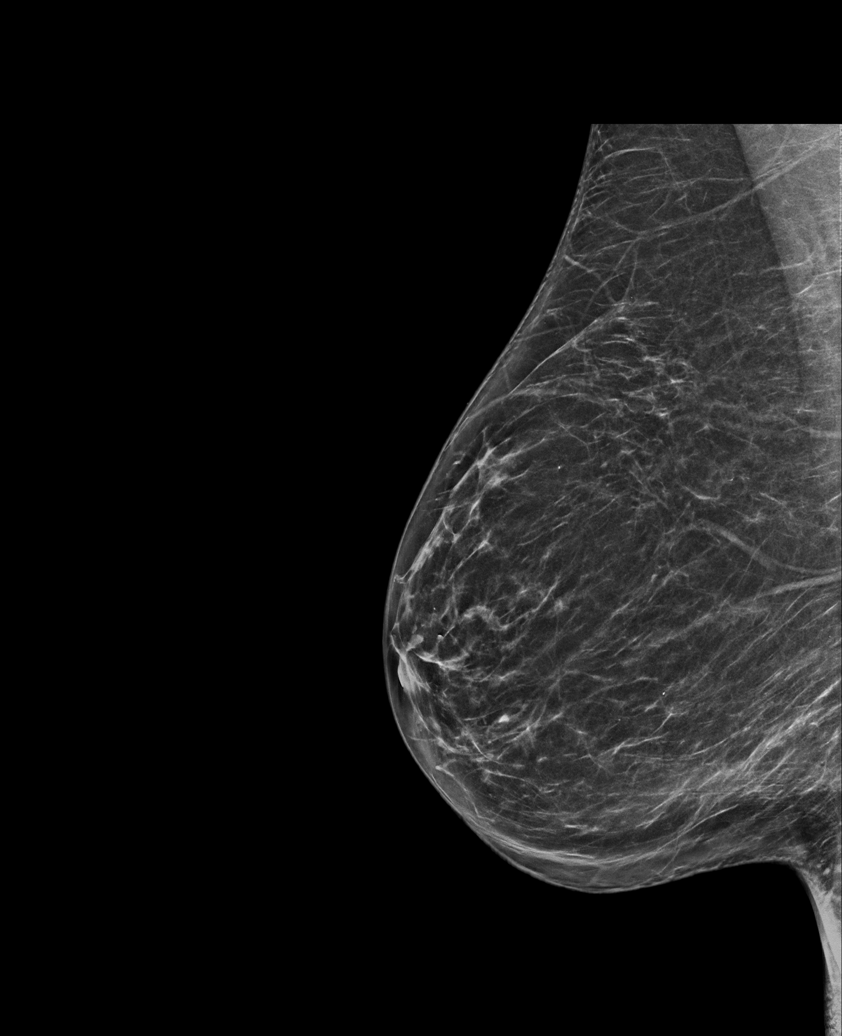

[R CC synth-2D]
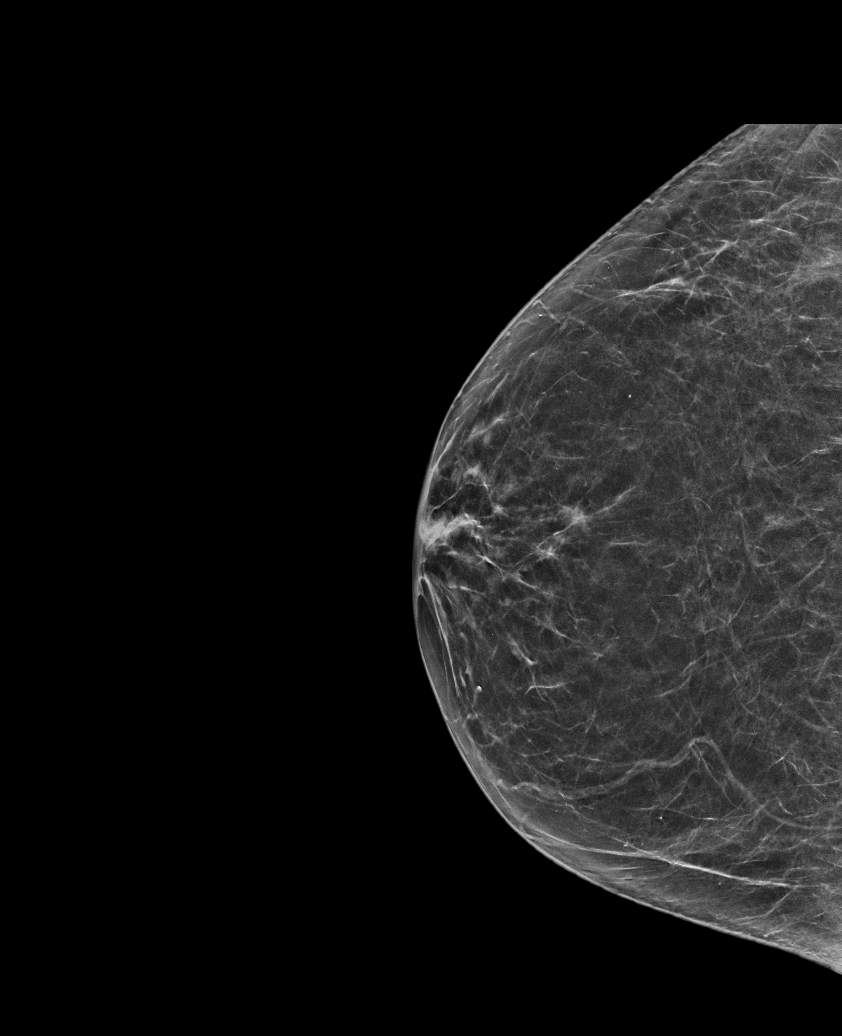

[R CC tomo · tomo slice 31/61.0]
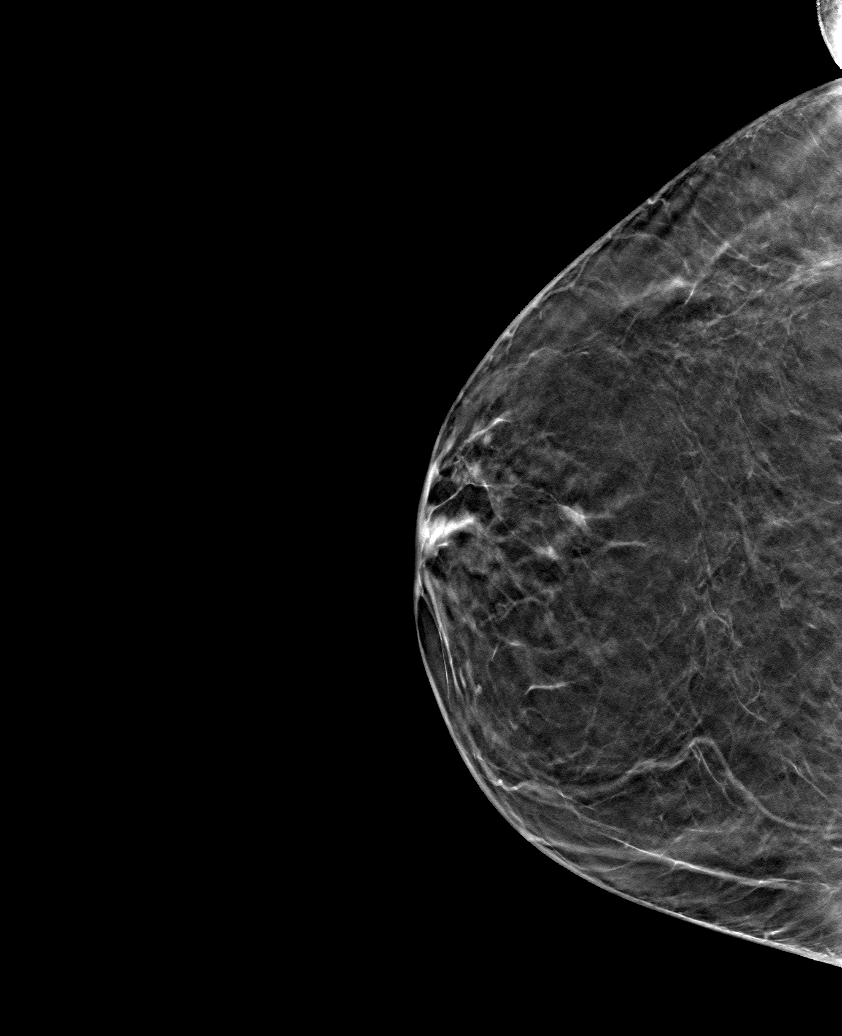

[R MLO tomo · tomo slice 33/66.0]
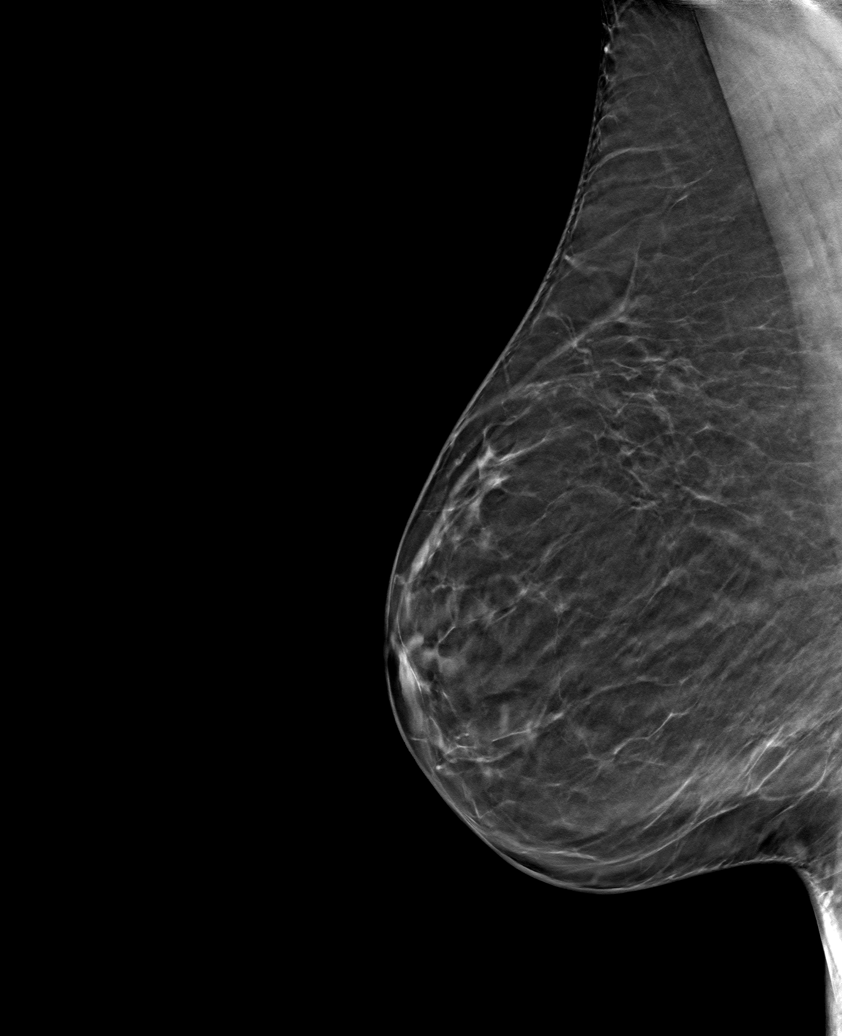

[6 of 14 positions shown; findings below may reference images not displayed]

ACR Breast Density Category b: There are scattered areas of
fibroglandular density.
FINDINGS: 2D and 3D tomographic images of the right breast demonstrate normal
appearing breast tissue at the location of recently suspected
asymmetry. There are no findings suspicious for malignancy.

Mammographic images were processed with CAD.
IMPRESSION: No evidence of malignancy. The recently suspected right breast
asymmetry was overlapping of normal breast tissue or a small cyst
that has subsequently resolved.

RECOMMENDATION:
Bilateral screening mammogram in 11 months when due.

I have discussed the findings and recommendations with the patient.
Results were also provided in writing at the conclusion of the
visit. If applicable, a reminder letter will be sent to the patient
regarding the next appointment.

BI-RADS CATEGORY  1: Negative.

## 2017-08-04 ENCOUNTER — Telehealth: Payer: Self-pay

## 2017-08-04 NOTE — Telephone Encounter (Signed)
pt called stated that she seen her weight loss doctor today and that he insurance will not cover the weight loss drug contrave so they want to put her on wellbutrin.  she wasnts to make sure that it is ok to take before she get rx filled.

## 2017-08-07 DIAGNOSIS — Z9884 Bariatric surgery status: Secondary | ICD-10-CM | POA: Insufficient documentation

## 2017-08-07 NOTE — Telephone Encounter (Signed)
Pt was called and advised to that she would need to be seen 1st for medical advise.

## 2017-08-07 NOTE — Telephone Encounter (Signed)
Will defer to Dr. Faheem

## 2017-08-07 NOTE — Telephone Encounter (Signed)
Not seen since June.please make appt for medical advise.

## 2017-08-07 NOTE — Telephone Encounter (Signed)
Please advised  

## 2017-08-28 ENCOUNTER — Ambulatory Visit: Payer: 59 | Admitting: Psychiatry

## 2017-08-28 ENCOUNTER — Encounter: Payer: Self-pay | Admitting: Psychiatry

## 2017-08-28 VITALS — BP 138/96 | HR 55 | Ht 70.0 in | Wt 199.0 lb

## 2017-08-28 DIAGNOSIS — F331 Major depressive disorder, recurrent, moderate: Secondary | ICD-10-CM | POA: Diagnosis not present

## 2017-08-28 DIAGNOSIS — F5081 Binge eating disorder: Secondary | ICD-10-CM | POA: Diagnosis not present

## 2017-08-28 MED ORDER — LAMOTRIGINE 150 MG PO TABS: 150.0000 mg | ORAL_TABLET | Freq: Every day | ORAL | 2 refills | Status: DC

## 2017-08-28 MED ORDER — TOPIRAMATE 50 MG PO TABS: 50.0000 mg | ORAL_TABLET | Freq: Two times a day (BID) | ORAL | 0 refills | Status: DC

## 2017-08-28 MED ORDER — TRAZODONE HCL 100 MG PO TABS: 100.0000 mg | ORAL_TABLET | Freq: Every day | ORAL | 1 refills | Status: DC

## 2017-08-28 MED ORDER — DESVENLAFAXINE SUCCINATE ER 100 MG PO TB24: 100.0000 mg | ORAL_TABLET | Freq: Every day | ORAL | 3 refills | Status: DC

## 2017-08-28 MED ORDER — BUSPIRONE HCL 10 MG PO TABS: 10.0000 mg | ORAL_TABLET | Freq: Two times a day (BID) | ORAL | 1 refills | Status: DC

## 2017-08-28 NOTE — Progress Notes (Signed)
[facility] MD/PA/NP OP Progress Note  08/28/2017 11:14 AM Toni Green  MRN:  [id number]  Subjective:    Patient is a 56 year-old married female who presented for follow-up  Patient reported that she is concerned about her weight gain and has been following with her metabolic weight loss clinic in [place]. She reported that she has seen a Designer, jewellery in [place] who has discussed with her different medications including contrave, victosa but they are not covered by her insurance. She reported that she wants to try something else to help her lose weight. She appeared anxious and apprehensive. We discussed about different medications options. She is open to suggestions. She is willing to try Topamax at this time. She reported that her current medications are helping her and her anxiety is under control. She is taking her medications as prescribed. She denied having any suicidal homicidal ideations or plans. She is working regular hours. She is looking forward to the holidays. She reported that she exercises at least 12-15 hours on a weekly basis. She is eating healthy meals.     No acute issues noted at this time. She currently denied having any suicidal homicidal ideations or plans.No acute issues noted at this time.    Chief Complaint:   Visit Diagnosis:     ICD-10-CM   1. MDD (major depressive disorder), recurrent episode, moderate (HCC) F33.1   2. Binge eating disorder F50.81     Past Medical History:  Past Medical History:  Diagnosis Date  . Anxiety   . Arthritis    fingers  . Bursitis    hips  . Cancer ([place])    skin ca  . Claustrophobia   . Depression   . Hypertension   . Kidney stones   . Motion sickness    back seat cars  . Seizures ([place]) 1985   x1, after labor/childbirth  . Thyroid disease     Past Surgical History:  Procedure Laterality Date  . ABDOMINAL HYSTERECTOMY    . APPENDECTOMY    . BACK SURGERY  10/2005   lumbar  . GALLBLADDER SURGERY    . GASTRIC BYPASS   2008   Family History:  Family History  Problem Relation Age of Onset  . Depression Mother   . Hypertension Mother   . Depression Father   . Hypertension Father   . Depression Sister   . Breast cancer Sister 39  . Hypertension Maternal Grandfather   . Depression Maternal Grandmother   . Hypertension Maternal Grandmother   . Hypertension Paternal Grandfather   . Hypertension Paternal Grandmother    Social History:  Social History   Socioeconomic History  . Marital status: Married    Spouse name: Not on file  . Number of children: Not on file  . Years of education: Not on file  . Highest education level: Not on file  Social Needs  . Financial resource strain: Not on file  . Food insecurity - worry: Not on file  . Food insecurity - inability: Not on file  . Transportation needs - medical: Not on file  . Transportation needs - non-medical: Not on file  Occupational History  . Not on file  Tobacco Use  . Smoking status: Never Smoker  . Smokeless tobacco: Never Used  Substance and Sexual Activity  . Alcohol use: No    Alcohol/week: 0.0 oz  . Drug use: No  . Sexual activity: Not Currently  Other Topics Concern  . Not on file  Social  History Narrative  . Not on file   Additional History:  Lives with her husband and has been working full-time in [place]. She denied use of drugs or alcohol at this time.  Assessment:   Musculoskeletal: Strength & Muscle Tone: within normal limits Gait & Station: normal Patient leans: N/A  Psychiatric Specialty Exam: Medication Refill  Pertinent negatives include no neck pain.  Anxiety  Symptoms include insomnia and nervous/anxious behavior. Patient reports no suicidal ideas.      Review of Systems  Musculoskeletal: Negative for neck pain.  Psychiatric/Behavioral: Negative for depression, hallucinations, memory loss and suicidal ideas. Substance abuse: overuse of Klonopin as discussed above. The patient is nervous/anxious and  has insomnia.     Blood pressure (!) 138/96, pulse (!) 55, height _0  (1.778 m), weight 199 lb (90.3 kg).Body mass index is 28.55 kg/m.  General Appearance: Fairly Groomed  Eye Contact:  Fair  Speech:  Normal Rate  Volume:  Normal  Mood:  Euthymic  Affect:  Congruent  Thought Process:  Coherent  Orientation:  Full (Time, Place, and Person)  Thought Content:  WDL  Suicidal Thoughts:  No  Homicidal Thoughts:  No  Memory:  Immediate;   Fair  Judgement:  Fair  Insight:  Fair  Psychomotor Activity:  Normal  Concentration:  Fair  Recall:  [employer] of Knowledge: Fair  Language: Fair  Akathisia:  No  Handed:  Right  AIMS (if indicated):  Assets:  Communication Skills Desire for Improvement Physical Health Social Support  ADL's:  Intact  Cognition: WNL  Sleep:     Is the patient at risk to self?  No. Has the patient been a risk to self in the past 6 months?  No. Has the patient been a risk to self within the distant past?  No. Is the patient a risk to others?  No. Has the patient been a risk to others in the past 6 months?  No. Has the patient been a risk to others within the distant past?  No.  Current Medications: Current Outpatient Medications  Medication Sig Dispense Refill  . busPIRone (BUSPAR) 10 MG tablet Take 1 tablet (10 mg total) 2 (two) times daily by mouth. 180 tablet 1  . desvenlafaxine (PRISTIQ) 100 MG 24 hr tablet Take 1 tablet (100 mg total) daily by mouth. 90 tablet 3  . Ferrous Gluconate (IRON 27 PO) Take by mouth.    . furosemide (LASIX) 20 MG tablet Take 1 tablet (20 mg total) by mouth daily. 90 tablet 3  . HYDROcodone-acetaminophen (NORCO/VICODIN) 5-325 MG tablet     . irbesartan (AVAPRO) 300 MG tablet TAKE 1 TABLET DAILY 90 tablet 1  . lamoTRIgine (LAMICTAL) 150 MG tablet Take 1 tablet (150 mg total) daily by mouth. 90 tablet 2  . levothyroxine (SYNTHROID, LEVOTHROID) 75 MCG tablet Take 1 tablet (75 mcg total) by mouth daily before breakfast. 90  tablet 3  . Lysine HCl 1000 MG TABS Take by mouth.    . Magnesium 250 MG TABS Take by mouth.    . Na Sulfate-K Sulfate-Mg Sulf (SUPREP BOWEL PREP KIT) 17.5-3.13-1.6 GM/180ML SOLN Take 1 kit by mouth as directed. 1 Bottle 0  . Nutritional Supplements (ESTROVEN PO) Take by mouth.    . prazosin (MINIPRESS) 5 MG capsule Take 1 capsule (5 mg total) by mouth at bedtime. 90 capsule 3  . propranolol (INDERAL) 10 MG tablet Take 1 tablet (10 mg total) by mouth 2 (two) times daily. 180 tablet 1  .  tamsulosin (FLOMAX) 0.4 MG CAPS capsule Take 0.4 mg by mouth as needed.     . traZODone (DESYREL) 100 MG tablet Take 1 tablet (100 mg total) at bedtime by mouth. 90 tablet 1  . vitamin B-12 (CYANOCOBALAMIN) 250 MCG tablet Take 1 tablet (250 mcg total) by mouth daily.    Marland Kitchen topiramate (TOPAMAX) 50 MG tablet Take 1 tablet (50 mg total) 2 (two) times daily by mouth. 60 tablet 0   No current facility-administered medications for this visit.     Medical Decision Making:  Review of Psycho-Social Stressors (1) and Review of Last Therapy Session (1)  Treatment Plan Summary:Medication management   I will start her on Topamax 50 mg by mouth daily at bedtime to help with the weight loss and she agreed with the plan Patient will continue on lamotrigine 150 mg daily.  Continue  BuSpar 10 mg by mouth twice a day to help with her anxiety symptoms.   propanolol 10 mg by mouth 2  times a day when necessary for her anxiety. She agreed with the plan. She will continue on trazodone at bedtime. Continue Pristiq 100 mg daily   Follow-up in 1 months  or  earlier depending on her symptoms    More than 50% of the time spent in psychoeducation, counseling and coordination of care.    This note was generated in part or whole with voice recognition software. Voice regonition is usually quite accurate but there are transcription errors that can and very often do occur. I apologize for any typographical errors that were not detected  and corrected.        Rainey Pines, MD    08/28/2017, 11:14 AM

## 2017-09-27 ENCOUNTER — Other Ambulatory Visit: Payer: Self-pay | Admitting: Internal Medicine

## 2017-09-27 DIAGNOSIS — I10 Essential (primary) hypertension: Secondary | ICD-10-CM

## 2017-09-28 NOTE — Telephone Encounter (Signed)
Will call back to set up appt 

## 2017-10-02 ENCOUNTER — Ambulatory Visit: Payer: 59 | Admitting: Psychiatry

## 2017-10-02 ENCOUNTER — Encounter: Payer: Self-pay | Admitting: Psychiatry

## 2017-10-02 VITALS — BP 112/82 | HR 87 | Ht 70.5 in | Wt 189.0 lb

## 2017-10-02 DIAGNOSIS — F411 Generalized anxiety disorder: Secondary | ICD-10-CM | POA: Diagnosis not present

## 2017-10-02 DIAGNOSIS — F331 Major depressive disorder, recurrent, moderate: Secondary | ICD-10-CM

## 2017-10-02 DIAGNOSIS — F5081 Binge eating disorder: Secondary | ICD-10-CM | POA: Diagnosis not present

## 2017-10-02 MED ORDER — TOPIRAMATE 100 MG PO TABS: 100.0000 mg | ORAL_TABLET | Freq: Every day | ORAL | 2 refills | Status: DC

## 2017-10-02 MED ORDER — TRAZODONE HCL 100 MG PO TABS: 100.0000 mg | ORAL_TABLET | Freq: Every day | ORAL | 1 refills | Status: DC

## 2017-10-02 MED ORDER — BUSPIRONE HCL 10 MG PO TABS: 10.0000 mg | ORAL_TABLET | Freq: Two times a day (BID) | ORAL | 1 refills | Status: DC

## 2017-10-02 MED ORDER — PROPRANOLOL HCL 10 MG PO TABS: 10.0000 mg | ORAL_TABLET | Freq: Every day | ORAL | 1 refills | Status: DC

## 2017-10-02 NOTE — Progress Notes (Signed)
BH MD/PA/NP OP Progress Note  10/02/2017 2:30 PM Toni Green  MRN:  [id number]  Subjective:    Patient is a 56 year-old married female who presented for follow-up  Patient reported that she is trying to lose weight as her husband had her gastric bypass surgery couple of weeks ago and he is doing well. She reported that she is also planning to start drinking protein shake on a daily basis. She stated that she is taking Topamax and has been helpful and she has already lost 10 pounds. She was very happy about the same. She reported that she does not have any side effects of medications. She feels calm and alert on the current combination the medications. Her anxiety level is also decreasing. She denied having any suicidal homicidal ideations or plans. We discussed about her medications in detail. She is supportive of her husband. She reported that the current combinations of medications have been very helpful.    No acute issues noted at this time. .    Chief Complaint:   Visit Diagnosis:     ICD-10-CM   1. MDD (major depressive disorder), recurrent episode, moderate (HCC) F33.1   2. Anxiety state F41.1   3. Binge eating disorder F50.81     Past Medical History:  Past Medical History:  Diagnosis Date  . Anxiety   . Arthritis    fingers  . Bursitis    hips  . Cancer ([place])    skin ca  . Claustrophobia   . Depression   . Hypertension   . Kidney stones   . Motion sickness    back seat cars  . Seizures ([place]) 1985   x1, after labor/childbirth  . Thyroid disease     Past Surgical History:  Procedure Laterality Date  . ABDOMINAL HYSTERECTOMY    . APPENDECTOMY    . BACK SURGERY  10/2005   lumbar  . COLONOSCOPY WITH PROPOFOL N/A 09/23/2016   Procedure: COLONOSCOPY WITH PROPOFOL;  Surgeon: Lucilla Lame, MD;  Location: [facility];  Service: Endoscopy;  Laterality: N/A;  . GALLBLADDER SURGERY    . GASTRIC BYPASS  2008  . POLYPECTOMY  09/23/2016   Procedure: POLYPECTOMY;   Surgeon: Lucilla Lame, MD;  Location: [facility];  Service: Endoscopy;;   Family History:  Family History  Problem Relation Age of Onset  . Depression Mother   . Hypertension Mother   . Depression Father   . Hypertension Father   . Depression Sister   . Breast cancer Sister 53  . Hypertension Maternal Grandfather   . Depression Maternal Grandmother   . Hypertension Maternal Grandmother   . Hypertension Paternal Grandfather   . Hypertension Paternal Grandmother    Social History:  Social History   Socioeconomic History  . Marital status: Married    Spouse name: None  . Number of children: None  . Years of education: None  . Highest education level: None  Social Needs  . Financial resource strain: None  . Food insecurity - worry: None  . Food insecurity - inability: None  . Transportation needs - medical: None  . Transportation needs - non-medical: None  Occupational History  . None  Tobacco Use  . Smoking status: Never Smoker  . Smokeless tobacco: Never Used  Substance and Sexual Activity  . Alcohol use: No    Alcohol/week: 0.0 oz  . Drug use: No  . Sexual activity: Not Currently  Other Topics Concern  . None  Social History Narrative  .  None   Additional History:  Lives with her husband and has been working full-time in [place]. She denied use of drugs or alcohol at this time.  Assessment:   Musculoskeletal: Strength & Muscle Tone: within normal limits Gait & Station: normal Patient leans: N/A  Psychiatric Specialty Exam: Medication Refill  Pertinent negatives include no neck pain.  Anxiety  Symptoms include insomnia and nervous/anxious behavior. Patient reports no suicidal ideas.      Review of Systems  Musculoskeletal: Negative for neck pain.  Psychiatric/Behavioral: Negative for depression, hallucinations, memory loss and suicidal ideas. Substance abuse: overuse of Klonopin as discussed above. The patient is nervous/anxious and has  insomnia.     Blood pressure 112/82, pulse 87, height 5' 10.5" (1.791 m), weight 189 lb (85.7 kg), SpO2 97 %.Body mass index is 26.74 kg/m.  General Appearance: Fairly Groomed  Eye Contact:  Fair  Speech:  Normal Rate  Volume:  Normal  Mood:  Euthymic  Affect:  Congruent  Thought Process:  Coherent  Orientation:  Full (Time, Place, and Person)  Thought Content:  WDL  Suicidal Thoughts:  No  Homicidal Thoughts:  No  Memory:  Immediate;   Fair  Judgement:  Fair  Insight:  Fair  Psychomotor Activity:  Normal  Concentration:  Fair  Recall:  [employer] of Knowledge: Fair  Language: Fair  Akathisia:  No  Handed:  Right  AIMS (if indicated):  Assets:  Communication Skills Desire for Improvement Physical Health Social Support  ADL's:  Intact  Cognition: WNL  Sleep:     Is the patient at risk to self?  No. Has the patient been a risk to self in the past 6 months?  No. Has the patient been a risk to self within the distant past?  No. Is the patient a risk to others?  No. Has the patient been a risk to others in the past 6 months?  No. Has the patient been a risk to others within the distant past?  No.  Current Medications: Current Outpatient Medications  Medication Sig Dispense Refill  . busPIRone (BUSPAR) 10 MG tablet Take 1 tablet (10 mg total) by mouth 2 (two) times daily. 180 tablet 1  . desvenlafaxine (PRISTIQ) 100 MG 24 hr tablet Take 1 tablet (100 mg total) daily by mouth. 90 tablet 3  . Ferrous Gluconate (IRON 27 PO) Take by mouth.    . furosemide (LASIX) 20 MG tablet Take 1 tablet (20 mg total) by mouth daily. 90 tablet 3  . irbesartan (AVAPRO) 300 MG tablet TAKE 1 TABLET DAILY 90 tablet 1  . lamoTRIgine (LAMICTAL) 150 MG tablet Take 1 tablet (150 mg total) daily by mouth. 90 tablet 2  . levothyroxine (SYNTHROID, LEVOTHROID) 75 MCG tablet Take 1 tablet (75 mcg total) by mouth daily before breakfast. 90 tablet 3  . Lysine HCl 1000 MG TABS Take by mouth.    .  Magnesium 250 MG TABS Take by mouth.    . Nutritional Supplements (ESTROVEN PO) Take by mouth.    . prazosin (MINIPRESS) 5 MG capsule TAKE 1 CAPSULE AT BEDTIME 90 capsule 0  . propranolol (INDERAL) 10 MG tablet Take 1 tablet (10 mg total) by mouth daily. 90 tablet 1  . tamsulosin (FLOMAX) 0.4 MG CAPS capsule Take 0.4 mg by mouth as needed.     . topiramate (TOPAMAX) 100 MG tablet Take 1 tablet (100 mg total) by mouth daily. 90 tablet 2  . traZODone (DESYREL) 100 MG tablet Take 1  tablet (100 mg total) by mouth at bedtime. 90 tablet 1  . TRULICITY 1.28 SK/8.1NG SOPN Inject 0.75 mg into the skin once a week.    . Na Sulfate-K Sulfate-Mg Sulf (SUPREP BOWEL PREP KIT) 17.5-3.13-1.6 GM/180ML SOLN Take 1 kit by mouth as directed. (Patient not taking: Reported on 10/02/2017) 1 Bottle 0  . vitamin B-12 (CYANOCOBALAMIN) 250 MCG tablet Take 1 tablet (250 mcg total) by mouth daily. (Patient not taking: Reported on 10/02/2017)     No current facility-administered medications for this visit.     Medical Decision Making:  Review of Psycho-Social Stressors (1) and Review of Last Therapy Session (1)  Treatment Plan Summary:Medication management   I will start her on Topamax 100 mg by mouth daily at bedtime to help with the weight loss and she agreed with the plan Patient will continue on lamotrigine 150 mg daily.  Continue  BuSpar 10 mg by mouth twice a day to help with her anxiety symptoms.   propanolol 10 mg by mouth daily  for her anxiety. She agreed with the plan. She will continue on trazodone at bedtime. Continue Pristiq 100 mg daily   Follow-up in 3  months  or  earlier depending on her symptoms    More than 50% of the time spent in psychoeducation, counseling and coordination of care.    This note was generated in part or whole with voice recognition software. Voice regonition is usually quite accurate but there are transcription errors that can and very often do occur. I apologize for any  typographical errors that were not detected and corrected.        Rainey Pines, MD    10/02/2017, 2:30 PM

## 2017-10-03 ENCOUNTER — Encounter: Payer: Self-pay | Admitting: Internal Medicine

## 2017-10-03 ENCOUNTER — Ambulatory Visit (INDEPENDENT_AMBULATORY_CARE_PROVIDER_SITE_OTHER): Payer: 59 | Admitting: Internal Medicine

## 2017-10-03 VITALS — BP 104/70 | HR 68 | Ht 70.0 in | Wt 186.6 lb

## 2017-10-03 DIAGNOSIS — I10 Essential (primary) hypertension: Secondary | ICD-10-CM | POA: Diagnosis not present

## 2017-10-03 DIAGNOSIS — Z1239 Encounter for other screening for malignant neoplasm of breast: Secondary | ICD-10-CM

## 2017-10-03 DIAGNOSIS — E033 Postinfectious hypothyroidism: Secondary | ICD-10-CM

## 2017-10-03 DIAGNOSIS — Z1231 Encounter for screening mammogram for malignant neoplasm of breast: Secondary | ICD-10-CM | POA: Diagnosis not present

## 2017-10-03 MED ORDER — IRBESARTAN 300 MG PO TABS: 300.0000 mg | ORAL_TABLET | Freq: Every day | ORAL | 3 refills | Status: DC

## 2017-10-03 MED ORDER — LEVOTHYROXINE SODIUM 75 MCG PO TABS: 75.0000 ug | ORAL_TABLET | Freq: Every day | ORAL | 3 refills | Status: DC

## 2017-10-03 MED ORDER — PRAZOSIN HCL 5 MG PO CAPS: 5.0000 mg | ORAL_CAPSULE | Freq: Every day | ORAL | 3 refills | Status: DC

## 2017-10-03 NOTE — Progress Notes (Signed)
Date:  10/03/2017   Name:  Toni Green   DOB:  Jan 07, 1961   MRN:  [id number]   Chief Complaint: Hypertension Hypertension  This is a chronic problem. Pertinent negatives include no chest pain, headaches, palpitations or shortness of breath. Past treatments include diuretics, alpha 1 blockers, beta blockers and angiotensin blockers. The current treatment provides moderate improvement. Identifiable causes of hypertension include a thyroid problem.  Thyroid Problem  Presents for follow-up visit. Patient reports no fatigue, menstrual problem or palpitations. The symptoms have been stable.   Now taking Trulicity for weight loss from the Bariatric surgeon.  She is also taking topamax from psychiatry. Still seeing psychiatry and doing very well.  Review of Systems  Constitutional: Negative for chills, fatigue and unexpected weight change.  Eyes: Negative for visual disturbance.  Respiratory: Negative for chest tightness and shortness of breath.   Cardiovascular: Negative for chest pain and palpitations.  Genitourinary: Negative for dyspareunia, menstrual problem, vaginal bleeding and vaginal discharge.  Neurological: Negative for dizziness and headaches.  Hematological: Negative for adenopathy.    Patient Active Problem List   Diagnosis Date Noted  . Recurrent major depressive disorder, in partial remission ([place]) 02/03/2017  . Special screening for malignant neoplasms, colon   . Benign neoplasm of ascending colon   . Polyp of sigmoid colon   . Menopause syndrome 07/26/2016  . Hypothyroidism 05/25/2016  . Essential (primary) hypertension 07/20/2015  . 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 disorder 07/20/2015  . 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 Severe episode of recurrent major depressive disorder, without psychotic features ([place]) 07/20/2015  . Borderline personality disorder ([place]) 04/23/2015  . 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 Complication of surgery 49/44/9675    . Bursitis, trochanteric 10/01/2014  . Inflammation of sacroiliac joint ([place]) 08/22/2014  . 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 DDD (degenerative disc disease), cervical 03/17/2014  . 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 Hyperoxaluria 08/15/2013  . Calculus of kidney 02/13/2013    Prior to Admission medications   Medication Sig Start Date End Date Taking? Authorizing Provider  irbesartan (AVAPRO) 300 MG tablet TAKE 1 TABLET DAILY 05/11/17  Yes Glean Hess, MD  busPIRone (BUSPAR) 10 MG tablet Take 1 tablet (10 mg total) by mouth 2 (two) times daily. 10/02/17   Rainey Pines, MD  desvenlafaxine (PRISTIQ) 100 MG 24 hr tablet Take 1 tablet (100 mg total) daily by mouth. 08/28/17   Rainey Pines, MD  Ferrous Gluconate (IRON 27 PO) Take by mouth.    [provider]  furosemide (LASIX) 20 MG tablet Take 1 tablet (20 mg total) by mouth daily. 02/03/17   Glean Hess, MD  lamoTRIgine (LAMICTAL) 150 MG tablet Take 1 tablet (150 mg total) daily by mouth. 08/28/17   Rainey Pines, MD  levothyroxine (SYNTHROID, LEVOTHROID) 75 MCG tablet Take 1 tablet (75 mcg total) by mouth daily before breakfast. 11/03/16   Glean Hess, MD  Lysine HCl 1000 MG TABS Take by mouth.    [provider]  Magnesium 250 MG TABS Take by mouth.    [provider]  Nutritional Supplements (ESTROVEN PO) Take by mouth.    [provider]  prazosin (MINIPRESS) 5 MG capsule TAKE 1 CAPSULE AT BEDTIME 09/27/17   Glean Hess, MD  propranolol (INDERAL) 10 MG tablet Take 1 tablet (10 mg total) by mouth daily. 10/02/17   Rainey Pines, MD  tamsulosin (FLOMAX) 0.4 MG CAPS capsule Take 0.4 mg  by mouth as needed.  06/28/13   [provider]  topiramate (TOPAMAX) 100 MG tablet Take 1 tablet (100 mg total) by mouth daily. 10/02/17   Rainey Pines, MD  traZODone (DESYREL) 100 MG tablet Take 1 tablet (100 mg total) by mouth at bedtime. 10/02/17   Rainey Pines, MD  TRULICITY 2.83 TD/1.7OH SOPN Inject 0.75 mg into the skin once a week. 09/20/17   [provider]  vitamin B-12 (CYANOCOBALAMIN) 250 MCG tablet Take 1 tablet (250 mcg total) by mouth daily. Patient not taking: Reported on 10/02/2017 02/23/16   Rainey Pines, MD    Allergies  Allergen Reactions  . Losartan Palpitations    Could feel heartbeat (strongly) in neck  . Oxycodone-Acetaminophen Itching       . Penicillins Rash  . Tape Rash    Skin irritation after back surgery    Past Surgical History:  Procedure Laterality Date  . ABDOMINAL HYSTERECTOMY    . APPENDECTOMY    . BACK SURGERY  10/2005   lumbar  . COLONOSCOPY WITH PROPOFOL N/A 09/23/2016   Procedure: COLONOSCOPY WITH PROPOFOL;  Surgeon: Lucilla Lame, MD;  Location: [facility];  Service: Endoscopy;  Laterality: N/A;  . GALLBLADDER SURGERY    . GASTRIC BYPASS  2008  . POLYPECTOMY  09/23/2016   Procedure: POLYPECTOMY;  Surgeon: Lucilla Lame, MD;  Location: [facility];  Service: Endoscopy;;    Social History   Tobacco Use  . Smoking status: Never Smoker  . Smokeless tobacco: Never Used  Substance Use Topics  . Alcohol use: No    Alcohol/week: 0.0 oz  . Drug use: No     Medication list has been reviewed and updated.  PHQ 2/9 Scores 10/03/2017 02/03/2017 11/01/2016  PHQ - 2 Score 0 1 0  PHQ- 9 Score 0 - -    Physical Exam  Constitutional: She is oriented to person, place, and time. She appears well-developed. No distress.  HENT:  Head: Normocephalic and atraumatic.  Neck: Normal range of motion. Neck supple.  Cardiovascular: Normal rate, regular rhythm and normal heart sounds.  Pulmonary/Chest: Effort normal and breath sounds normal. No respiratory distress. She has no wheezes.  Musculoskeletal: Normal range of motion. She exhibits no edema or tenderness.  Neurological: She is alert and oriented to person, place, and time.  Skin: Skin is warm and dry. No rash noted.  Psychiatric:  She has a normal mood and affect. Her behavior is normal. Thought content normal.  Nursing note and vitals reviewed.   BP 104/70   Pulse 68   Ht 5\' 10"  (1.778 m)   Wt 186 lb 9.6 oz (84.6 kg)   SpO2 100%   BMI 26.77 kg/m   Assessment and Plan: 1. Essential (primary) hypertension controlled - irbesartan (AVAPRO) 300 MG tablet; Take 1 tablet (300 mg total) by mouth daily.  Dispense: 90 tablet; Refill: 3 - prazosin (MINIPRESS) 5 MG capsule; Take 1 capsule (5 mg total) by mouth at bedtime.  Dispense: 90 capsule; Refill: 3  2. Postinfectious hypothyroidism supplemented - levothyroxine (SYNTHROID, LEVOTHROID) 75 MCG tablet; Take 1 tablet (75 mcg total) by mouth daily before breakfast.  Dispense: 90 tablet; Refill: 3  3. Breast cancer screening - MM DIGITAL SCREENING BILATERAL; Future   Meds ordered this encounter  Medications  . irbesartan (AVAPRO) 300 MG tablet    Sig: Take 1 tablet (300 mg total) by mouth daily.    Dispense:  90 tablet    Refill:  3  .  prazosin (MINIPRESS) 5 MG capsule    Sig: Take 1 capsule (5 mg total) by mouth at bedtime.    Dispense:  90 capsule    Refill:  3  . levothyroxine (SYNTHROID, LEVOTHROID) 75 MCG tablet    Sig: Take 1 tablet (75 mcg total) by mouth daily before breakfast.    Dispense:  90 tablet    Refill:  3    Partially dictated using Editor, commissioning. Any errors are unintentional.  Halina Maidens, MD [facility]  10/03/2017

## 2017-10-24 ENCOUNTER — Other Ambulatory Visit: Payer: Self-pay | Admitting: Psychiatry

## 2017-11-03 ENCOUNTER — Telehealth: Payer: Self-pay

## 2017-11-03 DIAGNOSIS — F331 Major depressive disorder, recurrent, moderate: Secondary | ICD-10-CM

## 2017-11-03 MED ORDER — BUSPIRONE HCL 10 MG PO TABS: 10.0000 mg | ORAL_TABLET | Freq: Two times a day (BID) | ORAL | 0 refills | Status: DC

## 2017-11-03 NOTE — Telephone Encounter (Signed)
pt called states that she still has not received the buspar from mail order when she called they stated that they have not had since july and that she would need to try and find another pharmacy that had it. pt states she call [facility] and they have it. Can you please send in a 90 day supply into [facility].   This is a dr. Gretel Acre pt was last seen on  10-02-17 and next appt  01-01-18  busPIRone (BUSPAR) 10 MG tablet  Medication  Date: 10/02/2017 Department: [facility] Ordering/Authorizing: Rainey Pines, MD  Order Providers   Prescribing Provider Encounter Provider  Rainey Pines, MD Rainey Pines, MD  Medication Detail    Disp Refills Start End   busPIRone (BUSPAR) 10 MG tablet 180 tablet 1 10/02/2017    Sig - Route: Take 1 tablet (10 mg total) by mouth 2 (two) times daily. - Oral   Sent to pharmacy as: busPIRone (BUSPAR) 10 MG tablet   E-Prescribing Status: Receipt confirmed by pharmacy (10/02/2017 2:00 PM EST)

## 2017-11-03 NOTE — Telephone Encounter (Signed)
Received BuSpar refill request since she did not receive it from mail order.  Patient wants it sent to [facility].  Sent BuSpar  Patient for 90-day supply to [place], [place], [place].

## 2017-11-03 NOTE — Telephone Encounter (Signed)
Please let patient know.

## 2017-11-03 NOTE — Telephone Encounter (Signed)
Pt.notified

## 2017-11-28 ENCOUNTER — Encounter: Payer: Self-pay | Admitting: Internal Medicine

## 2017-11-28 ENCOUNTER — Ambulatory Visit: Payer: 59 | Admitting: Internal Medicine

## 2017-11-28 VITALS — BP 132/80 | HR 62 | Temp 98.3°F | Ht 70.0 in | Wt 187.0 lb

## 2017-11-28 DIAGNOSIS — J0140 Acute pansinusitis, unspecified: Secondary | ICD-10-CM | POA: Diagnosis not present

## 2017-11-28 MED ORDER — AZITHROMYCIN 250 MG PO TABS: ORAL_TABLET | ORAL | 0 refills | Status: AC

## 2017-11-28 NOTE — Patient Instructions (Signed)
Start fluticasone nasal spray -

## 2017-11-28 NOTE — Progress Notes (Signed)
Date:  11/28/2017   Name:  Toni Green   DOB:  Jan 12, 1961   MRN:  [id number]   Chief Complaint: Sore Throat (X 2 days. Difficulty swallowing. This morning started to become more worse. Back of throat as swollen adn irritated this morning. Blowing out green mucous out of nose. Dry cough. Headache. Left ear feels clogged up. Body is achy. )  Sinusitis  This is a new problem. The current episode started in the past 7 days. The problem has been gradually worsening since onset. There has been no fever. The pain is moderate. Associated symptoms include congestion, coughing, headaches, sinus pressure and a sore throat. Pertinent negatives include no chills or shortness of breath.     Review of Systems  Constitutional: Positive for fatigue. Negative for chills, fever and unexpected weight change.  HENT: Positive for congestion, hearing loss, sinus pressure, sinus pain and sore throat.   Eyes: Negative for visual disturbance.  Respiratory: Positive for cough. Negative for chest tightness, shortness of breath and wheezing.   Cardiovascular: Negative for chest pain, palpitations and leg swelling.  Neurological: Positive for headaches.    Patient Active Problem List   Diagnosis Date Noted  . Recurrent major depressive disorder, in partial remission ([place]) 02/03/2017  . Special screening for malignant neoplasms, colon   . Benign neoplasm of ascending colon   . Polyp of sigmoid colon   . Menopause syndrome 07/26/2016  . 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 Essential (primary) hypertension 07/20/2015  . 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 disorder 07/20/2015  . 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 Complication of surgery 44/12/4740  . Bursitis, trochanteric 10/01/2014  . Inflammation of sacroiliac joint ([place]) 08/22/2014  . 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 DDD (degenerative disc disease), cervical 03/17/2014  .  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 Hyperoxaluria 08/15/2013  . Calculus of kidney 02/13/2013    Prior to Admission medications   Medication Sig Start Date End Date Taking? Authorizing Provider  busPIRone (BUSPAR) 10 MG tablet Take 1 tablet (10 mg total) by mouth 2 (two) times daily. 11/03/17  Yes Ursula Alert, MD  desvenlafaxine (PRISTIQ) 100 MG 24 hr tablet Take 1 tablet (100 mg total) daily by mouth. 08/28/17  Yes Rainey Pines, MD  Ferrous Gluconate (IRON 27 PO) Take by mouth.   Yes [provider]  furosemide (LASIX) 20 MG tablet Take 1 tablet (20 mg total) by mouth daily. 02/03/17  Yes Glean Hess, MD  irbesartan (AVAPRO) 300 MG tablet Take 1 tablet (300 mg total) by mouth daily. 10/03/17  Yes Glean Hess, MD  lamoTRIgine (LAMICTAL) 150 MG tablet Take 1 tablet (150 mg total) daily by mouth. 08/28/17  Yes Rainey Pines, MD  levothyroxine (SYNTHROID, LEVOTHROID) 75 MCG tablet Take 1 tablet (75 mcg total) by mouth daily before breakfast. 10/03/17  Yes Glean Hess, MD  Lysine HCl 1000 MG TABS Take by mouth.   Yes [provider]  Magnesium 250 MG TABS Take by mouth.   Yes [provider]  Nutritional Supplements (ESTROVEN PO) Take by mouth.   Yes [provider]  prazosin (MINIPRESS) 5 MG capsule Take 1 capsule (5 mg total) by mouth at bedtime. 10/03/17  Yes Glean Hess, MD  propranolol (INDERAL) 10 MG tablet Take 1 tablet (10 mg total) by mouth daily. 10/02/17  Yes Rainey Pines, MD  tamsulosin (FLOMAX) 0.4 MG CAPS capsule Take 0.4 mg  by mouth as needed.  06/28/13  Yes [provider]  topiramate (TOPAMAX) 100 MG tablet Take 1 tablet (100 mg total) by mouth daily. 10/02/17  Yes Rainey Pines, MD  traZODone (DESYREL) 100 MG tablet Take 1 tablet (100 mg total) by mouth at bedtime. 10/02/17  Yes Rainey Pines, MD      Yes [provider]  vitamin B-12 (CYANOCOBALAMIN) 250 MCG tablet Take 1 tablet (250 mcg  total) by mouth daily. 02/23/16  Yes Rainey Pines, MD    Allergies  Allergen Reactions  . Losartan Palpitations    Could feel heartbeat (strongly) in neck  . Oxycodone-Acetaminophen Itching       . Penicillins Rash  . Tape Rash    Skin irritation after back surgery    Past Surgical History:  Procedure Laterality Date  . ABDOMINAL HYSTERECTOMY    . APPENDECTOMY    . BACK SURGERY  10/2005   lumbar  . COLONOSCOPY WITH PROPOFOL N/A 09/23/2016   Procedure: COLONOSCOPY WITH PROPOFOL;  Surgeon: Lucilla Lame, MD;  Location: [facility];  Service: Endoscopy;  Laterality: N/A;  . GALLBLADDER SURGERY    . GASTRIC BYPASS  2008  . POLYPECTOMY  09/23/2016   Procedure: POLYPECTOMY;  Surgeon: Lucilla Lame, MD;  Location: [facility];  Service: Endoscopy;;    Social History   Tobacco Use  . Smoking status: Never Smoker  . Smokeless tobacco: Never Used  Substance Use Topics  . Alcohol use: No    Alcohol/week: 0.0 oz  . Drug use: No     Medication list has been reviewed and updated.  PHQ 2/9 Scores 10/03/2017 02/03/2017 11/01/2016  PHQ - 2 Score 0 1 0  PHQ- 9 Score 0 - -    Physical Exam  Constitutional: She is oriented to person, place, and time. She appears well-developed and well-nourished.  HENT:  Right Ear: External ear and ear canal normal. Tympanic membrane is not erythematous and not retracted.  Left Ear: External ear and ear canal normal. Tympanic membrane is not erythematous and not retracted.  Nose: Right sinus exhibits maxillary sinus tenderness and frontal sinus tenderness. Left sinus exhibits maxillary sinus tenderness and frontal sinus tenderness.  Mouth/Throat: Uvula is midline and mucous membranes are normal. No oral lesions. Posterior oropharyngeal erythema present. No oropharyngeal exudate.  Cardiovascular: Normal rate, regular rhythm and normal heart sounds.  Pulmonary/Chest: Breath sounds normal. She has no wheezes. She has no rales.  Lymphadenopathy:     She has no cervical adenopathy.  Neurological: She is alert and oriented to person, place, and time.    BP 132/80   Pulse 62   Temp 98.3 F (36.8 C) (Oral)   Ht 5\' 10"  (1.778 m)   Wt 187 lb (84.8 kg)   SpO2 99%   BMI 26.83 kg/m   Assessment and Plan: 1. Acute non-recurrent pansinusitis Start Flonase and continue fluids and rest - azithromycin (ZITHROMAX Z-PAK) 250 MG tablet; UAD  Dispense: 6 each; Refill: 0   Meds ordered this encounter  Medications  . azithromycin (ZITHROMAX Z-PAK) 250 MG tablet    Sig: UAD    Dispense:  6 each    Refill:  0    Partially dictated using Editor, commissioning. Any errors are unintentional.  Halina Maidens, MD [facility]  11/28/2017

## 2017-12-25 ENCOUNTER — Ambulatory Visit: Payer: 59 | Admitting: Psychiatry

## 2018-01-01 ENCOUNTER — Encounter: Payer: Self-pay | Admitting: Psychiatry

## 2018-01-01 ENCOUNTER — Other Ambulatory Visit: Payer: Self-pay

## 2018-01-01 ENCOUNTER — Ambulatory Visit: Payer: Self-pay | Admitting: Psychiatry

## 2018-01-01 VITALS — BP 136/91 | HR 52 | Wt 178.8 lb

## 2018-01-01 DIAGNOSIS — F411 Generalized anxiety disorder: Secondary | ICD-10-CM

## 2018-01-01 DIAGNOSIS — F331 Major depressive disorder, recurrent, moderate: Secondary | ICD-10-CM

## 2018-01-01 MED ORDER — TOPIRAMATE 100 MG PO TABS: 200.0000 mg | ORAL_TABLET | Freq: Every day | ORAL | 2 refills | Status: DC

## 2018-01-01 MED ORDER — TRAZODONE HCL 100 MG PO TABS: 100.0000 mg | ORAL_TABLET | Freq: Every day | ORAL | 1 refills | Status: DC

## 2018-01-01 MED ORDER — BUSPIRONE HCL 10 MG PO TABS: 10.0000 mg | ORAL_TABLET | Freq: Two times a day (BID) | ORAL | 0 refills | Status: DC

## 2018-01-01 MED ORDER — PROPRANOLOL HCL 10 MG PO TABS: 10.0000 mg | ORAL_TABLET | Freq: Every day | ORAL | 1 refills | Status: DC

## 2018-01-01 MED ORDER — DESVENLAFAXINE SUCCINATE ER 100 MG PO TB24: 100.0000 mg | ORAL_TABLET | Freq: Every day | ORAL | 3 refills | Status: DC

## 2018-01-01 MED ORDER — LAMOTRIGINE 150 MG PO TABS: 150.0000 mg | ORAL_TABLET | Freq: Every day | ORAL | 2 refills | Status: DC
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
## 2018-04-06 ENCOUNTER — Encounter: Payer: Self-pay | Admitting: Internal Medicine

## 2018-04-09 ENCOUNTER — Encounter: Payer: Self-pay | Admitting: Psychiatry

## 2018-04-09 ENCOUNTER — Other Ambulatory Visit: Payer: Self-pay

## 2018-04-09 ENCOUNTER — Ambulatory Visit: Payer: Self-pay | Admitting: Psychiatry

## 2018-04-09 ENCOUNTER — Ambulatory Visit: Payer: 59 | Admitting: Psychiatry

## 2018-04-09 VITALS — BP 128/88 | HR 74 | Wt 169.6 lb

## 2018-04-09 DIAGNOSIS — F331 Major depressive disorder, recurrent, moderate: Secondary | ICD-10-CM | POA: Diagnosis not present

## 2018-04-09 DIAGNOSIS — F330 Major depressive disorder, recurrent, mild: Secondary | ICD-10-CM | POA: Diagnosis not present

## 2018-04-09 DIAGNOSIS — F411 Generalized anxiety disorder: Secondary | ICD-10-CM | POA: Diagnosis not present

## 2018-04-09 MED ORDER — TOPIRAMATE 100 MG PO TABS: 200.0000 mg | ORAL_TABLET | Freq: Every day | ORAL | 2 refills | Status: DC

## 2018-04-09 MED ORDER — TRAZODONE HCL 100 MG PO TABS: 100.0000 mg | ORAL_TABLET | Freq: Every day | ORAL | 1 refills | Status: DC

## 2018-04-09 MED ORDER — PROPRANOLOL HCL 10 MG PO TABS: 10.0000 mg | ORAL_TABLET | Freq: Every day | ORAL | 1 refills | Status: DC

## 2018-04-09 MED ORDER — LAMOTRIGINE 150 MG PO TABS: 150.0000 mg | ORAL_TABLET | Freq: Every day | ORAL | 2 refills | Status: DC

## 2018-04-09 MED ORDER — BUSPIRONE HCL 10 MG PO TABS: 10.0000 mg | ORAL_TABLET | Freq: Every day | ORAL | 1 refills | Status: DC

## 2018-04-09 MED ORDER — DESVENLAFAXINE SUCCINATE ER 100 MG PO TB24: 100.0000 mg | ORAL_TABLET | Freq: Every day | ORAL | 3 refills | Status: DC

## 2018-04-09 NOTE — Progress Notes (Signed)
BH MD/PA/NP OP Progress Note  04/09/2018 4:35 PM Toni Green  MRN:  [id number]  Subjective:    Patient is a 57 year-old married female who presented for follow-up accompanied  by her husband.  Patient reported that she continues to have concerns about her son who was living with her but now he has moved out with his wife.  She reported that she has very close relationship with him.  She reported that she calls him couple of times during the daytime and her son also does the same.  We discussed about the relationship issues between her and the son.  Her husband reported that she she is too much involved with her son.  Patient reported that she wants to get some space from her son at this time.  Patient reported that she has lost weight as she was started on phentermine by the primary care physician although she is taking the Topamax as prescribed.  She reported that she is also getting pain medication on a as needed basis.  She appeared calm and anxious during the interview.  She denied abusing her medications.  She reported that she has been sleeping well at night.  She denied any perceptual disturbances.  She is receptive to her medications.  All medications are refilled at this time.       She was given enough refills of her medications at this time.  She denied having any suicidal or homicidal ideations or plans.  She denied having any perceptual disturbances.     . She reported that the current combinations of medications have been very helpful.      Chief Complaint:  Chief Complaint    Follow-up; Medication Refill     Visit Diagnosis:     ICD-10-CM   1. MDD (major depressive disorder), recurrent episode, mild ([place]) F33.0   2. Anxiety state F41.1   3. MDD (major depressive disorder), recurrent episode, moderate ([facility]) F33.1 busPIRone (BUSPAR) 10 MG tablet    Past Medical History:  Past Medical History:  Diagnosis Date  . Anxiety   . Arthritis    fingers  . Bursitis    hips  . Cancer ([place])    skin ca  . Claustrophobia   . Depression   . Hypertension   . Kidney stones   . Motion sickness    back seat cars  . Seizures ([place]) 1985   x1, after labor/childbirth  . Thyroid disease     Past Surgical History:  Procedure Laterality Date  . ABDOMINAL HYSTERECTOMY    . APPENDECTOMY    . BACK SURGERY  10/2005   lumbar  . COLONOSCOPY WITH PROPOFOL N/A 09/23/2016   Procedure: COLONOSCOPY WITH PROPOFOL;  Surgeon: Lucilla Lame, MD;  Location: [facility];  Service: Endoscopy;  Laterality: N/A;  . GALLBLADDER SURGERY    . GASTRIC BYPASS  2008  . POLYPECTOMY  09/23/2016   Procedure: POLYPECTOMY;  Surgeon: Lucilla Lame, MD;  Location: [facility];  Service: Endoscopy;;   Family History:  Family History  Problem Relation Age of Onset  . Depression Mother   . Hypertension Mother   . Depression Father   . Hypertension Father   . Depression Sister   . Breast cancer Sister 48  . Hypertension Maternal Grandfather   . Depression Maternal Grandmother   . Hypertension Maternal Grandmother   . Hypertension Paternal Grandfather   . Hypertension Paternal Grandmother    Social History:  Social History   Socioeconomic History  .  Marital status: Married    Spouse name: Not on file  . Number of children: Not on file  . Years of education: Not on file  . Highest education level: Not on file  Occupational History  . Not on file  Social Needs  . Financial resource strain: Not on file  . Food insecurity:    Worry: Not on file    Inability: Not on file  . Transportation needs:    Medical: Not on file    Non-medical: Not on file  Tobacco Use  . Smoking status: Never Smoker  . Smokeless tobacco: Never Used  Substance and Sexual Activity  . Alcohol use: No    Alcohol/week: 0.0 oz  . Drug use: No  . Sexual activity: Not Currently  Lifestyle  . Physical activity:    Days per week: Not on file    Minutes per session: Not on file  . Stress: Not  on file  Relationships  . Social connections:    Talks on phone: Not on file    Gets together: Not on file    Attends religious service: Not on file    Active member of club or organization: Not on file    Attends meetings of clubs or organizations: Not on file    Relationship status: Not on file  Other Topics Concern  . Not on file  Social History Narrative  . Not on file   Additional History:  Lives with her husband and has been working full-time in [place]. She denied use of drugs or alcohol at this time.  Assessment:   Musculoskeletal: Strength & Muscle Tone: within normal limits Gait & Station: normal Patient leans: N/A  Psychiatric Specialty Exam: Medication Refill  Pertinent negatives include no neck pain.  Anxiety  Symptoms include insomnia and nervous/anxious behavior. Patient reports no suicidal ideas.      Review of Systems  Musculoskeletal: Negative for neck pain.  Psychiatric/Behavioral: Negative for depression, hallucinations, memory loss and suicidal ideas. Substance abuse: overuse of Klonopin as discussed above. The patient is nervous/anxious and has insomnia.     Blood pressure 128/88, pulse 74, weight 169 lb 9 oz (76.9 kg).Body mass index is 24.33 kg/m.  General Appearance: Fairly Groomed  Eye Contact:  Fair  Speech:  Normal Rate  Volume:  Normal  Mood:  Euthymic  Affect:  Congruent  Thought Process:  Coherent  Orientation:  Full (Time, Place, and Person)  Thought Content:  WDL  Suicidal Thoughts:  No  Homicidal Thoughts:  No  Memory:  Immediate;   Fair  Judgement:  Fair  Insight:  Fair  Psychomotor Activity:  Normal  Concentration:  Fair  Recall:  [employer] of Knowledge: Fair  Language: Fair  Akathisia:  No  Handed:  Right  AIMS (if indicated):  Assets:  Communication Skills Desire for Improvement Physical Health Social Support  ADL's:  Intact  Cognition: WNL  Sleep:     Is the patient at risk to self?  No. Has the patient been a  risk to self in the past 6 months?  No. Has the patient been a risk to self within the distant past?  No. Is the patient a risk to others?  No. Has the patient been a risk to others in the past 6 months?  No. Has the patient been a risk to others within the distant past?  No.  Current Medications: Current Outpatient Medications  Medication Sig Dispense Refill  . busPIRone (BUSPAR) 10 MG  tablet Take 1 tablet (10 mg total) by mouth at bedtime. 90 tablet 1  . desvenlafaxine (PRISTIQ) 100 MG 24 hr tablet Take 1 tablet (100 mg total) by mouth daily. 90 tablet 3  . Ferrous Gluconate (IRON 27 PO) Take by mouth.    . furosemide (LASIX) 20 MG tablet Take 1 tablet (20 mg total) by mouth daily. 90 tablet 3  . furosemide (LASIX) 20 MG tablet Take by mouth.    . irbesartan (AVAPRO) 300 MG tablet Take 1 tablet (300 mg total) by mouth daily. 90 tablet 3  . lamoTRIgine (LAMICTAL) 150 MG tablet Take 1 tablet (150 mg total) by mouth daily. 90 tablet 2  . levothyroxine (SYNTHROID, LEVOTHROID) 75 MCG tablet Take 1 tablet (75 mcg total) by mouth daily before breakfast. 90 tablet 3  . Lysine HCl 1000 MG TABS Take by mouth.    . Magnesium 250 MG TABS Take by mouth.    . Nutritional Supplements (ESTROVEN PO) Take by mouth.    . phentermine 15 MG capsule Take by mouth.    . prazosin (MINIPRESS) 5 MG capsule Take 1 capsule (5 mg total) by mouth at bedtime. 90 capsule 3  . propranolol (INDERAL) 10 MG tablet Take 1 tablet (10 mg total) by mouth daily. 90 tablet 1  . tamsulosin (FLOMAX) 0.4 MG CAPS capsule Take by mouth.    . topiramate (TOPAMAX) 100 MG tablet Take 2 tablets (200 mg total) by mouth daily. 180 tablet 2  . traZODone (DESYREL) 100 MG tablet Take 1 tablet (100 mg total) by mouth at bedtime. 90 tablet 1  . vitamin B-12 (CYANOCOBALAMIN) 250 MCG tablet Take 1 tablet (250 mcg total) by mouth daily.    Marland Kitchen HYDROcodone-acetaminophen (NORCO/VICODIN) 5-325 MG tablet TAKE 1 TABLET BY MOUTH TWICE DAILY AS NEEDED. SHE  WILL NEED A FOLLOW UP APPOINTMENT IN 1 MONTH TO GET FURTHER REFILLS.  0   No current facility-administered medications for this visit.     Medical Decision Making:  Review of Psycho-Social Stressors (1) and Review of Last Therapy Session (1)  Treatment Plan Summary:Medication management    Topamax 200 mg by mouth daily at bedtime to help with the weight loss and she agreed with the plan Patient will continue on lamotrigine 150 mg daily.  Continue  BuSpar 10 mg by mouth once day to help with her anxiety symptoms.   propanolol 10 mg by mouth daily  for her anxiety. She agreed with the plan. She will continue on trazodone at bedtime. Continue Pristiq 100 mg daily   Follow-up in 3  months  or  earlier depending on her symptoms    More than 50% of the time spent in psychoeducation, counseling and coordination of care.    This note was generated in part or whole with voice recognition software. Voice regonition is usually quite accurate but there are transcription errors that can and very often do occur. I apologize for any typographical errors that were not detected and corrected.        Rainey Pines, MD    04/09/2018, 4:35 PM

## 2018-06-05 ENCOUNTER — Other Ambulatory Visit: Payer: Self-pay | Admitting: Internal Medicine

## 2018-06-05 DIAGNOSIS — I10 Essential (primary) hypertension: Secondary | ICD-10-CM

## 2018-06-27 ENCOUNTER — Encounter: Payer: Self-pay | Admitting: Internal Medicine

## 2018-06-27 ENCOUNTER — Ambulatory Visit: Payer: 59 | Admitting: Internal Medicine

## 2018-06-27 VITALS — BP 114/80 | HR 68 | Temp 98.3°F | Ht 70.0 in | Wt 169.0 lb

## 2018-06-27 DIAGNOSIS — I10 Essential (primary) hypertension: Secondary | ICD-10-CM | POA: Diagnosis not present

## 2018-06-27 DIAGNOSIS — J0100 Acute maxillary sinusitis, unspecified: Secondary | ICD-10-CM | POA: Diagnosis not present

## 2018-06-27 MED ORDER — PRAZOSIN HCL 5 MG PO CAPS: 5.0000 mg | ORAL_CAPSULE | Freq: Every day | ORAL | 3 refills | Status: DC

## 2018-06-27 MED ORDER — AZITHROMYCIN 250 MG PO TABS: ORAL_TABLET | ORAL | 0 refills | Status: AC

## 2018-06-27 MED ORDER — BENZONATATE 100 MG PO CAPS: 100.0000 mg | ORAL_CAPSULE | Freq: Three times a day (TID) | ORAL | 0 refills | Status: DC

## 2018-06-27 MED ORDER — FLUTICASONE PROPIONATE 50 MCG/ACT NA SUSP: 2.0000 | Freq: Every day | NASAL | 6 refills | Status: DC

## 2018-06-27 NOTE — Progress Notes (Signed)
Date:  06/27/2018   Name:  Toni Green   DOB:  1961/09/09   MRN:  [id number]   Chief Complaint: Sinusitis (Coughing, runny nose, yellow gerrn production. Started 2 days ago. Would like Zpack and flonase nasal spray and wants recommendations for cough.  )  Sinusitis  This is a new problem. The current episode started in the past 7 days. The problem has been gradually worsening since onset. There has been no fever. The pain is moderate. Associated symptoms include congestion and sinus pressure. Pertinent negatives include no chills, headaches or shortness of breath.  Hypertension  This is a chronic problem. The problem is controlled. Pertinent negatives include no chest pain, headaches, palpitations, peripheral edema or shortness of breath. Past treatments include central alpha agonists, beta blockers and angiotensin blockers. The current treatment provides significant improvement.     Review of Systems  Constitutional: Negative for chills, fatigue and fever.  HENT: Positive for congestion and sinus pressure.   Respiratory: Negative for chest tightness and shortness of breath.   Cardiovascular: Negative for chest pain and palpitations.  Neurological: Negative for dizziness and headaches.    Patient Active Problem List   Diagnosis Date Noted  . Recurrent major depressive disorder, in partial remission ([place]) 02/03/2017  . Special screening for malignant neoplasms, colon   . Benign neoplasm of ascending colon   . Polyp of sigmoid colon   . Menopause syndrome 07/26/2016  . 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 Essential (primary) hypertension 07/20/2015  . 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 disorder 07/20/2015  . 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 Complication of surgery 66/29/4765  . Bursitis, trochanteric 10/01/2014  . Inflammation of sacroiliac joint ([place]) 08/22/2014  . 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   . DDD (degenerative disc disease), cervical 03/17/2014  . 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 Hyperoxaluria 08/15/2013  . Calculus of kidney 02/13/2013    Allergies  Allergen Reactions  . Losartan Palpitations    Could feel heartbeat (strongly) in neck  . Oxycodone-Acetaminophen Itching       . Penicillins Rash  . Tape Rash    Skin irritation after back surgery    Past Surgical History:  Procedure Laterality Date  . ABDOMINAL HYSTERECTOMY    . APPENDECTOMY    . BACK SURGERY  10/2005   lumbar  . COLONOSCOPY WITH PROPOFOL N/A 09/23/2016   Procedure: COLONOSCOPY WITH PROPOFOL;  Surgeon: Lucilla Lame, MD;  Location: [facility];  Service: Endoscopy;  Laterality: N/A;  . GALLBLADDER SURGERY    . GASTRIC BYPASS  2008  . POLYPECTOMY  09/23/2016   Procedure: POLYPECTOMY;  Surgeon: Lucilla Lame, MD;  Location: [facility];  Service: Endoscopy;;    Social History   Tobacco Use  . Smoking status: Never Smoker  . Smokeless tobacco: Never Used  Substance Use Topics  . Alcohol use: No    Alcohol/week: 0.0 standard drinks  . Drug use: No     Medication list has been reviewed and updated.  Current Meds  Medication Sig  . busPIRone (BUSPAR) 10 MG tablet Take 1 tablet (10 mg total) by mouth at bedtime.  Marland Kitchen desvenlafaxine (PRISTIQ) 100 MG 24 hr tablet Take 1 tablet (100 mg total) by mouth daily.  . Ferrous Gluconate (IRON 27 PO) Take by mouth.  . furosemide (LASIX) 20 MG tablet Take by mouth.  Marland Kitchen HYDROcodone-acetaminophen (NORCO/VICODIN) 5-325 MG tablet TAKE 1 TABLET BY MOUTH TWICE DAILY  AS NEEDED. SHE WILL NEED A FOLLOW UP APPOINTMENT IN 1 MONTH TO GET FURTHER REFILLS.  Marland Kitchen irbesartan (AVAPRO) 300 MG tablet Take 1 tablet (300 mg total) by mouth daily.  Marland Kitchen lamoTRIgine (LAMICTAL) 150 MG tablet Take 1 tablet (150 mg total) by mouth daily.  Marland Kitchen levothyroxine (SYNTHROID, LEVOTHROID) 75 MCG tablet Take 1 tablet (75 mcg total) by mouth daily before breakfast.  .  Lysine HCl 1000 MG TABS Take by mouth.  . Magnesium 250 MG TABS Take by mouth.  . Nutritional Supplements (ESTROVEN PO) Take by mouth.  . prazosin (MINIPRESS) 5 MG capsule Take 1 capsule (5 mg total) by mouth at bedtime.  . propranolol (INDERAL) 10 MG tablet Take 1 tablet (10 mg total) by mouth daily.  . tamsulosin (FLOMAX) 0.4 MG CAPS capsule Take by mouth.  . topiramate (TOPAMAX) 100 MG tablet Take 2 tablets (200 mg total) by mouth daily.  . traZODone (DESYREL) 100 MG tablet Take 1 tablet (100 mg total) by mouth at bedtime.  . vitamin B-12 (CYANOCOBALAMIN) 250 MCG tablet Take 1 tablet (250 mcg total) by mouth daily.    PHQ 2/9 Scores 06/27/2018 10/03/2017 02/03/2017 11/01/2016  PHQ - 2 Score 0 0 1 0  PHQ- 9 Score 0 0 - -    Physical Exam  Constitutional: She is oriented to person, place, and time. She appears well-developed and well-nourished.  HENT:  Right Ear: External ear and ear canal normal. Tympanic membrane is not erythematous and not retracted.  Left Ear: External ear and ear canal normal. Tympanic membrane is not erythematous and not retracted.  Nose: Right sinus exhibits maxillary sinus tenderness and frontal sinus tenderness. Left sinus exhibits maxillary sinus tenderness and frontal sinus tenderness.  Mouth/Throat: Uvula is midline and mucous membranes are normal. No oral lesions. No oropharyngeal exudate or posterior oropharyngeal erythema.  Cardiovascular: Normal rate, regular rhythm and normal heart sounds.  Pulmonary/Chest: Breath sounds normal. She has no wheezes. She has no rales.  Lymphadenopathy:    She has no cervical adenopathy.  Neurological: She is alert and oriented to person, place, and time.    BP 114/80 (BP Location: Right Arm, Patient Position: Sitting, Cuff Size: Normal)   Pulse 68   Temp 98.3 F (36.8 C) (Oral)   Ht 5\' 10"  (1.778 m)   Wt 169 lb (76.7 kg)   SpO2 100%   BMI 24.25 kg/m   Assessment and Plan: 1. Acute non-recurrent maxillary  sinusitis Continue fluids - azithromycin (ZITHROMAX Z-PAK) 250 MG tablet; UAD  Dispense: 6 each; Refill: 0 - fluticasone (FLONASE) 50 MCG/ACT nasal spray; Place 2 sprays into both nostrils daily.  Dispense: 16 g; Refill: 6 - benzonatate (TESSALON) 100 MG capsule; Take 1 capsule (100 mg total) by mouth 3 (three) times daily.  Dispense: 30 capsule; Refill: 0  2. Essential (primary) hypertension controlled - prazosin (MINIPRESS) 5 MG capsule; Take 1 capsule (5 mg total) by mouth at bedtime.  Dispense: 90 capsule; Refill: 3   Meds ordered this encounter  Medications  . azithromycin (ZITHROMAX Z-PAK) 250 MG tablet    Sig: UAD    Dispense:  6 each    Refill:  0  . fluticasone (FLONASE) 50 MCG/ACT nasal spray    Sig: Place 2 sprays into both nostrils daily.    Dispense:  16 g    Refill:  6  . benzonatate (TESSALON) 100 MG capsule    Sig: Take 1 capsule (100 mg total) by mouth 3 (three) times daily.  Dispense:  30 capsule    Refill:  0  . prazosin (MINIPRESS) 5 MG capsule    Sig: Take 1 capsule (5 mg total) by mouth at bedtime.    Dispense:  90 capsule    Refill:  3    Partially dictated using Editor, commissioning. Any errors are unintentional.  Halina Maidens, MD [facility]  06/27/2018

## 2018-07-16 ENCOUNTER — Ambulatory Visit: Payer: 59 | Admitting: Psychiatry

## 2018-07-23 ENCOUNTER — Ambulatory Visit: Payer: Self-pay | Admitting: Psychiatry

## 2018-07-30 ENCOUNTER — Encounter: Payer: Self-pay | Admitting: Psychiatry

## 2018-07-30 ENCOUNTER — Ambulatory Visit: Payer: 59 | Admitting: Psychiatry

## 2018-07-30 DIAGNOSIS — F411 Generalized anxiety disorder: Secondary | ICD-10-CM

## 2018-07-30 DIAGNOSIS — F331 Major depressive disorder, recurrent, moderate: Secondary | ICD-10-CM | POA: Diagnosis not present

## 2018-07-30 DIAGNOSIS — F330 Major depressive disorder, recurrent, mild: Secondary | ICD-10-CM

## 2018-07-30 MED ORDER — BUSPIRONE HCL 10 MG PO TABS: 10.0000 mg | ORAL_TABLET | Freq: Every day | ORAL | 1 refills | Status: DC

## 2018-07-30 MED ORDER — LAMOTRIGINE 150 MG PO TABS: 150.0000 mg | ORAL_TABLET | Freq: Every day | ORAL | 2 refills | Status: DC

## 2018-07-30 MED ORDER — DESVENLAFAXINE SUCCINATE ER 100 MG PO TB24: 100.0000 mg | ORAL_TABLET | Freq: Every day | ORAL | 3 refills | Status: DC

## 2018-07-30 MED ORDER — TRAZODONE HCL 100 MG PO TABS: 100.0000 mg | ORAL_TABLET | Freq: Every day | ORAL | 1 refills | Status: DC

## 2018-07-30 MED ORDER — TOPIRAMATE 100 MG PO TABS: 200.0000 mg | ORAL_TABLET | Freq: Every day | ORAL | 2 refills | Status: DC

## 2018-07-30 NOTE — Progress Notes (Signed)
BH MD/PA/NP OP Progress Note  07/30/2018 12:00 PM Toni Green  MRN:  [id number]  Subjective:    Patient is a 57 year-old married female who presented for follow-up.  Patient has lost significant amount of weight since her last appointment.  She reported that she used phentermine as prescribed by her primary care physician and she has stopped using it since June.  She is trying to control her diet and has been eating less bread.  She reported that she asked her PCP to give her second round of phentermine as she wants to lose another 15 pounds.  She was excited about her weight loss.  Patient reported that she has been feeling better on her current combinations of medications.  She is dressed throughout the day.  Patient reported that she has been feeling stable and is interested in decreasing the amount of medications she has been using.  We discussed about her medications in detail.  She is taking several psychotropic medications.  We discussed about stopping the propranolol at this time as her anxiety level has significantly decreased.  She is agreeable with the plan.  She reported that her work is going well and her husband has been supportive.  No acute psychiatric issues noted at this time.      She denied having any suicidal homicidal ideations or plans.  She denied having any perceptual disturbances.  L.      Chief Complaint:   Visit Diagnosis:     ICD-10-CM   1. MDD (major depressive disorder), recurrent episode, mild ([place]) F33.0   2. Anxiety state F41.1     Past Medical History:  Past Medical History:  Diagnosis Date  . Anxiety   . Arthritis    fingers  . Bursitis    hips  . Cancer ([place])    skin ca  . Claustrophobia   . Depression   . Hypertension   . Kidney stones   . Motion sickness    back seat cars  . Seizures ([place]) 1985   x1, after labor/childbirth  . Thyroid disease     Past Surgical History:  Procedure Laterality Date  . ABDOMINAL HYSTERECTOMY    .  APPENDECTOMY    . BACK SURGERY  10/2005   lumbar  . COLONOSCOPY WITH PROPOFOL N/A 09/23/2016   Procedure: COLONOSCOPY WITH PROPOFOL;  Surgeon: Lucilla Lame, MD;  Location: [facility];  Service: Endoscopy;  Laterality: N/A;  . GALLBLADDER SURGERY    . GASTRIC BYPASS  2008  . POLYPECTOMY  09/23/2016   Procedure: POLYPECTOMY;  Surgeon: Lucilla Lame, MD;  Location: [facility];  Service: Endoscopy;;   Family History:  Family History  Problem Relation Age of Onset  . Depression Mother   . Hypertension Mother   . Depression Father   . Hypertension Father   . Depression Sister   . Breast cancer Sister 47  . Hypertension Maternal Grandfather   . Depression Maternal Grandmother   . Hypertension Maternal Grandmother   . Hypertension Paternal Grandfather   . Hypertension Paternal Grandmother    Social History:  Social History   Socioeconomic History  . Marital status: Married    Spouse name: Not on file  . Number of children: Not on file  . Years of education: Not on file  . Highest education level: Not on file  Occupational History  . Not on file  Social Needs  . Financial resource strain: Not on file  . Food insecurity:  Worry: Not on file    Inability: Not on file  . Transportation needs:    Medical: Not on file    Non-medical: Not on file  Tobacco Use  . Smoking status: Never Smoker  . Smokeless tobacco: Never Used  Substance and Sexual Activity  . Alcohol use: No    Alcohol/week: 0.0 standard drinks  . Drug use: No  . Sexual activity: Not Currently  Lifestyle  . Physical activity:    Days per week: Not on file    Minutes per session: Not on file  . Stress: Not on file  Relationships  . Social connections:    Talks on phone: Not on file    Gets together: Not on file    Attends religious service: Not on file    Active member of club or organization: Not on file    Attends meetings of clubs or organizations: Not on file    Relationship status: Not  on file  Other Topics Concern  . Not on file  Social History Narrative  . Not on file   Additional History:  Lives with her husband and has been working full-time in [place]. She denied use of drugs or alcohol at this time.  Assessment:   Musculoskeletal: Strength & Muscle Tone: within normal limits Gait & Station: normal Patient leans: N/A  Psychiatric Specialty Exam: Medication Refill  Pertinent negatives include no neck pain.  Anxiety  Symptoms include insomnia and nervous/anxious behavior. Patient reports no suicidal ideas.      Review of Systems  Musculoskeletal: Negative for neck pain.  Psychiatric/Behavioral: Negative for depression, hallucinations, memory loss and suicidal ideas. Substance abuse: overuse of Klonopin as discussed above. The patient is nervous/anxious and has insomnia.     There were no vitals taken for this visit.There is no height or weight on file to calculate BMI.  General Appearance: Fairly Groomed  Eye Contact:  Fair  Speech:  Normal Rate  Volume:  Normal  Mood:  Euthymic  Affect:  Congruent  Thought Process:  Coherent  Orientation:  Full (Time, Place, and Person)  Thought Content:  WDL  Suicidal Thoughts:  No  Homicidal Thoughts:  No  Memory:  Immediate;   Fair  Judgement:  Fair  Insight:  Fair  Psychomotor Activity:  Normal  Concentration:  Fair  Recall:  [employer] of Knowledge: Fair  Language: Fair  Akathisia:  No  Handed:  Right  AIMS (if indicated):  Assets:  Communication Skills Desire for Improvement Physical Health Social Support  ADL's:  Intact  Cognition: WNL  Sleep:     Is the patient at risk to self?  No. Has the patient been a risk to self in the past 6 months?  No. Has the patient been a risk to self within the distant past?  No. Is the patient a risk to others?  No. Has the patient been a risk to others in the past 6 months?  No. Has the patient been a risk to others within the distant past?  No.  Current  Medications: Current Outpatient Medications  Medication Sig Dispense Refill  . benzonatate (TESSALON) 100 MG capsule Take 1 capsule (100 mg total) by mouth 3 (three) times daily. 30 capsule 0  . busPIRone (BUSPAR) 10 MG tablet Take 1 tablet (10 mg total) by mouth at bedtime. 90 tablet 1  . desvenlafaxine (PRISTIQ) 100 MG 24 hr tablet Take 1 tablet (100 mg total) by mouth daily. 90 tablet 3  .  Ferrous Gluconate (IRON 27 PO) Take by mouth.    . fluticasone (FLONASE) 50 MCG/ACT nasal spray Place 2 sprays into both nostrils daily. 16 g 6  . furosemide (LASIX) 20 MG tablet Take by mouth.    Marland Kitchen HYDROcodone-acetaminophen (NORCO/VICODIN) 5-325 MG tablet TAKE 1 TABLET BY MOUTH TWICE DAILY AS NEEDED. SHE WILL NEED A FOLLOW UP APPOINTMENT IN 1 MONTH TO GET FURTHER REFILLS.  0  . irbesartan (AVAPRO) 300 MG tablet Take 1 tablet (300 mg total) by mouth daily. 90 tablet 3  . lamoTRIgine (LAMICTAL) 150 MG tablet Take 1 tablet (150 mg total) by mouth daily. 90 tablet 2  . levothyroxine (SYNTHROID, LEVOTHROID) 75 MCG tablet Take 1 tablet (75 mcg total) by mouth daily before breakfast. 90 tablet 3  . Lysine HCl 1000 MG TABS Take by mouth.    . Magnesium 250 MG TABS Take by mouth.    . Nutritional Supplements (ESTROVEN PO) Take by mouth.    . prazosin (MINIPRESS) 5 MG capsule Take 1 capsule (5 mg total) by mouth at bedtime. 90 capsule 3  . propranolol (INDERAL) 10 MG tablet Take 1 tablet (10 mg total) by mouth daily. 90 tablet 1  . tamsulosin (FLOMAX) 0.4 MG CAPS capsule Take by mouth.    . topiramate (TOPAMAX) 100 MG tablet Take 2 tablets (200 mg total) by mouth daily. 180 tablet 2  . traZODone (DESYREL) 100 MG tablet Take 1 tablet (100 mg total) by mouth at bedtime. 90 tablet 1  . vitamin B-12 (CYANOCOBALAMIN) 250 MCG tablet Take 1 tablet (250 mcg total) by mouth daily.     No current facility-administered medications for this visit.     Medical Decision Making:  Review of Psycho-Social Stressors (1) and  Review of Last Therapy Session (1)  Treatment Plan Summary:Medication management    Topamax 200 mg by mouth daily at bedtime to help with the weight loss and she agreed with the plan Patient will continue on lamotrigine 150 mg daily.  Continue  BuSpar 10 mg by mouth once day to help with her anxiety symptoms.   d/c  propanolol 10 mg.  She will continue on trazodone at bedtime. Continue Pristiq 100 mg daily   Follow-up in 3  months  or  earlier depending on her symptoms    More than 50% of the time spent in psychoeducation, counseling and coordination of care.    This note was generated in part or whole with voice recognition software. Voice regonition is usually quite accurate but there are transcription errors that can and very often do occur. I apologize for any typographical errors that were not detected and corrected.        Rainey Pines, MD    07/30/2018, 12:00 PM

## 2018-10-29 ENCOUNTER — Other Ambulatory Visit: Payer: Self-pay

## 2018-10-29 ENCOUNTER — Ambulatory Visit: Payer: 59 | Admitting: Psychiatry

## 2018-10-29 ENCOUNTER — Encounter: Payer: Self-pay | Admitting: Psychiatry

## 2018-10-29 VITALS — BP 127/84 | HR 66 | Temp 98.3°F | Wt 167.2 lb

## 2018-10-29 DIAGNOSIS — F330 Major depressive disorder, recurrent, mild: Secondary | ICD-10-CM | POA: Diagnosis not present

## 2018-10-29 DIAGNOSIS — F411 Generalized anxiety disorder: Secondary | ICD-10-CM | POA: Diagnosis not present

## 2018-10-29 MED ORDER — LAMOTRIGINE 150 MG PO TABS: 150.0000 mg | ORAL_TABLET | Freq: Every day | ORAL | 2 refills | Status: DC

## 2018-10-29 NOTE — Progress Notes (Signed)
BH MD/PA/NP OP Progress Note  10/29/2018 9:21 AM Toni Green  MRN:  [id number]  Subjective:    Patient is a 58 year-old married female who presented for follow-up.  Patient reported that she stopped the propanolol after her last appointment but she started feeling anxious and was shaking and nervous in group situations.  She started having panic attacks.  She reported that she started taking propanolol again on a as needed basis.  She reported that her symptoms improved.  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  She reported that it helps with her anticipatory anxiety and also with her panic symptoms.  She is compliant with her medications.  She reported that she has a stop taking phentermine which was prescribed by her primary care physician.  She reported that she feels well.  Her husband is supportive.  He has also lost weight after his stomach surgery and she is happy about the same.  She denied having any side effects of her current medications.  She reported that she is going to have her labs done next month.    Patient remains calm and alert.  She denied having any use of drugs or alcohol she is sleeping well at night.  She is able to contract for safety.     She denied having any suicidal homicidal ideations or plans.  She denied having any perceptual disturbances.  L.      Chief Complaint:  Chief Complaint    Follow-up; Medication Refill     Visit Diagnosis:     ICD-10-CM   1. MDD (major depressive disorder), recurrent episode, mild ([place]) F33.0   2. Anxiety state F41.1     Past Medical History:  Past Medical History:  Diagnosis Date  . Anxiety   . Arthritis    fingers  . Bursitis    hips  . Cancer ([place])    skin ca  . Claustrophobia   . Depression   . Hypertension   . Kidney stones   . Motion sickness    back seat cars  . Seizures ([place]) 1985   x1, after labor/childbirth  . Thyroid  disease     Past Surgical History:  Procedure Laterality Date  . ABDOMINAL HYSTERECTOMY    . APPENDECTOMY    . BACK SURGERY  10/2005   lumbar  . COLONOSCOPY WITH PROPOFOL N/A 09/23/2016   Procedure: COLONOSCOPY WITH PROPOFOL;  Surgeon: Lucilla Lame, MD;  Location: [facility];  Service: Endoscopy;  Laterality: N/A;  . GALLBLADDER SURGERY    . GASTRIC BYPASS  2008  . POLYPECTOMY  09/23/2016   Procedure: POLYPECTOMY;  Surgeon: Lucilla Lame, MD;  Location: [facility];  Service: Endoscopy;;   Family History:  Family History  Problem Relation Age of Onset  . Depression Mother   . Hypertension Mother   . Depression Father   . Hypertension Father   . Depression Sister   . Breast cancer Sister 53  . Hypertension Maternal Grandfather   . Depression Maternal Grandmother   . Hypertension Maternal Grandmother   . Hypertension Paternal Grandfather   . Hypertension Paternal Grandmother    Social History:  Social History   Socioeconomic History  . Marital status: Married    Spouse name: Not on file  . Number of children: Not on file  . Years of education: Not on file  . Highest education level: Not on file  Occupational History  . Not on file  Social Needs  . Financial resource strain: Not on file  . Food insecurity:    Worry: Not on file    Inability: Not on file  . Transportation needs:    Medical: Not on file    Non-medical: Not on file  Tobacco Use  . Smoking status: Never Smoker  . Smokeless tobacco: Never Used  Substance and Sexual Activity  . Alcohol use: No    Alcohol/week: 0.0 standard drinks  . Drug use: No  . Sexual activity: Not Currently  Lifestyle  . Physical activity:    Days per week: Not on file    Minutes per session: Not on file  . Stress: Not on file  Relationships  . Social connections:    Talks on phone: Not on file    Gets together: Not on file    Attends religious service: Not on file    Active member of club or organization:  Not on file    Attends meetings of clubs or organizations: Not on file    Relationship status: Not on file  Other Topics Concern  . Not on file  Social History Narrative  . Not on file   Additional History:  Lives with her husband and has been working full-time in [place]. She denied use of drugs or alcohol at this time.  Assessment:   Musculoskeletal: Strength & Muscle Tone: within normal limits Gait & Station: normal Patient leans: N/A  Psychiatric Specialty Exam: Medication Refill  Pertinent negatives include no neck pain.  Anxiety  Symptoms include insomnia and nervous/anxious behavior. Patient reports no suicidal ideas.      Review of Systems  Musculoskeletal: Negative for neck pain.  Psychiatric/Behavioral: Negative for depression, hallucinations, memory loss and suicidal ideas. Substance abuse: overuse of Klonopin as discussed above. The patient is nervous/anxious and has insomnia.     Blood pressure 127/84, pulse 66, temperature 98.3 F (36.8 C), temperature source Oral, weight 167 lb 3.2 oz (75.8 kg).Body mass index is 23.99 kg/m.  General Appearance: Fairly Groomed  Eye Contact:  Fair  Speech:  Normal Rate  Volume:  Normal  Mood:  Euthymic  Affect:  Congruent  Thought Process:  Coherent  Orientation:  Full (Time, Place, and Person)  Thought Content:  WDL  Suicidal Thoughts:  No  Homicidal Thoughts:  No  Memory:  Immediate;   Fair  Judgement:  Fair  Insight:  Fair  Psychomotor Activity:  Normal  Concentration:  Fair  Recall:  [employer] of Knowledge: Fair  Language: Fair  Akathisia:  No  Handed:  Right  AIMS (if indicated):  Assets:  Communication Skills Desire for Improvement Physical Health Social Support  ADL's:  Intact  Cognition: WNL  Sleep:     Is the patient at risk to self?  No. Has the patient been a risk to self in the past 6 months?  No. Has the patient been a risk to self within the distant past?  No. Is the patient a risk to  others?  No. Has the patient been a risk to others in the past 6 months?  No. Has the patient been a risk to others within the distant past?  No.  Current Medications: Current Outpatient Medications  Medication Sig Dispense Refill  . benzonatate (TESSALON) 100 MG capsule Take 1 capsule (100 mg total) by mouth 3 (three) times daily. 30 capsule 0  . busPIRone (BUSPAR) 10 MG tablet Take 1 tablet (10  mg total) by mouth at bedtime. 90 tablet 1  . desvenlafaxine (PRISTIQ) 100 MG 24 hr tablet Take 1 tablet (100 mg total) by mouth daily. 90 tablet 3  . Ferrous Gluconate (IRON 27 PO) Take by mouth.    . fluticasone (FLONASE) 50 MCG/ACT nasal spray Place 2 sprays into both nostrils daily. 16 g 6  . furosemide (LASIX) 20 MG tablet Take by mouth.    Marland Kitchen HYDROcodone-acetaminophen (NORCO/VICODIN) 5-325 MG tablet TAKE 1 TABLET BY MOUTH TWICE DAILY AS NEEDED. SHE WILL NEED A FOLLOW UP APPOINTMENT IN 1 MONTH TO GET FURTHER REFILLS.  0  . irbesartan (AVAPRO) 300 MG tablet Take 1 tablet (300 mg total) by mouth daily. 90 tablet 3  . lamoTRIgine (LAMICTAL) 150 MG tablet Take 1 tablet (150 mg total) by mouth daily. 90 tablet 2  . levothyroxine (SYNTHROID, LEVOTHROID) 75 MCG tablet Take 1 tablet (75 mcg total) by mouth daily before breakfast. 90 tablet 3  . Lysine HCl 1000 MG TABS Take by mouth.    . Magnesium 250 MG TABS Take by mouth.    . Nutritional Supplements (ESTROVEN PO) Take by mouth.    . phentermine 15 MG capsule Take by mouth.    . prazosin (MINIPRESS) 5 MG capsule Take 1 capsule (5 mg total) by mouth at bedtime. 90 capsule 3  . tamsulosin (FLOMAX) 0.4 MG CAPS capsule Take by mouth.    . topiramate (TOPAMAX) 100 MG tablet Take 2 tablets (200 mg total) by mouth daily. 180 tablet 2  . traMADol (ULTRAM) 50 MG tablet 1/2-1 po bid prn    . traZODone (DESYREL) 100 MG tablet Take 1 tablet (100 mg total) by mouth at bedtime. 90 tablet 1   No current facility-administered medications for this visit.      Medical Decision Making:  Review of Psycho-Social Stressors (1) and Review of Last Therapy Session (1)  Treatment Plan Summary:Medication management    Topamax 200 mg by mouth daily at bedtime to help with the weight loss and she agreed with the plan Patient will continue on lamotrigine 150 mg daily.  Continue  BuSpar 10 mg by mouth once day to help with her anxiety symptoms.   propanolol 10 mg prn for social anxiety. She will continue on trazodone at bedtime. Continue Pristiq 100 mg daily   Follow-up in 3  months  or  earlier depending on her symptoms    More than 50% of the time spent in psychoeducation, counseling and coordination of care.    This note was generated in part or whole with voice recognition software. Voice regonition is usually quite accurate but there are transcription errors that can and very often do occur. I apologize for any typographical errors that were not detected and corrected.        Rainey Pines, MD    10/29/2018, 9:21 AM

## 2018-11-30 ENCOUNTER — Ambulatory Visit (INDEPENDENT_AMBULATORY_CARE_PROVIDER_SITE_OTHER): Payer: 59 | Admitting: Internal Medicine

## 2018-11-30 ENCOUNTER — Encounter: Payer: Self-pay | Admitting: Internal Medicine

## 2018-11-30 ENCOUNTER — Other Ambulatory Visit: Payer: Self-pay

## 2018-11-30 VITALS — BP 132/70 | HR 62 | Ht 70.0 in | Wt 173.0 lb

## 2018-11-30 DIAGNOSIS — I10 Essential (primary) hypertension: Secondary | ICD-10-CM

## 2018-11-30 DIAGNOSIS — Z1231 Encounter for screening mammogram for malignant neoplasm of breast: Secondary | ICD-10-CM | POA: Diagnosis not present

## 2018-11-30 DIAGNOSIS — E033 Postinfectious hypothyroidism: Secondary | ICD-10-CM

## 2018-11-30 DIAGNOSIS — Z0000 Encounter for general adult medical examination without abnormal findings: Secondary | ICD-10-CM

## 2018-11-30 DIAGNOSIS — K625 Hemorrhage of anus and rectum: Secondary | ICD-10-CM | POA: Insufficient documentation

## 2018-11-30 DIAGNOSIS — G2581 Restless legs syndrome: Secondary | ICD-10-CM | POA: Insufficient documentation

## 2018-11-30 DIAGNOSIS — F3341 Major depressive disorder, recurrent, in partial remission: Secondary | ICD-10-CM

## 2018-11-30 LAB — POCT URINALYSIS DIPSTICK
Bilirubin, UA: NEGATIVE
Blood, UA: NEGATIVE
Glucose, UA: NEGATIVE
Ketones, UA: NEGATIVE
Leukocytes, UA: NEGATIVE
Nitrite, UA: NEGATIVE
Protein, UA: NEGATIVE
Spec Grav, UA: 1.015 (ref 1.010–1.025)
Urobilinogen, UA: 0.2 E.U./dL
pH, UA: 5 (ref 5.0–8.0)

## 2018-11-30 MED ORDER — IRBESARTAN 300 MG PO TABS: 300.0000 mg | ORAL_TABLET | Freq: Every day | ORAL | 3 refills | Status: DC

## 2018-11-30 MED ORDER — ROPINIROLE HCL 0.25 MG PO TABS: 0.2500 mg | ORAL_TABLET | Freq: Every day | ORAL | 0 refills | Status: DC

## 2018-11-30 MED ORDER — LEVOTHYROXINE SODIUM 75 MCG PO TABS: 75.0000 ug | ORAL_TABLET | Freq: Every day | ORAL | 3 refills | Status: DC

## 2018-11-30 NOTE — Progress Notes (Signed)
Date:  11/30/2018   Name:  Toni Green   DOB:  Mar 17, 1961   MRN:  [id number]   Chief Complaint: Annual Exam (Breast Exam. ) LAMIYA Green is a 58 y.o. female who presents today for her Complete Annual Exam. She feels fairly well. She reports exercising some. She reports she is sleeping fairly well. She is due for a mammogram.  She denies breast issues. She is up to date on colonoscopy.  She does not need Pap.  Hypertension  This is a chronic problem. The problem is controlled. Pertinent negatives include no chest pain, headaches, palpitations or shortness of breath. Past treatments include beta blockers, angiotensin blockers and central alpha agonists. The current treatment provides significant improvement. There are no compliance problems.  Identifiable causes of hypertension include a thyroid problem.  Thyroid Problem  Presents for follow-up visit. Patient reports no anxiety, constipation, diarrhea, fatigue, palpitations or tremors. The symptoms have been stable.  Depression         This is a chronic problem.The problem is unchanged.  Associated symptoms include myalgias (RLS sx).  Associated symptoms include no fatigue and no headaches.  Past treatments include psychotherapy, SNRIs - Serotonin and norepinephrine reuptake inhibitors and other medications.  Compliance with treatment is good.  Past medical history includes thyroid problem.   RLS - after going to bed sx start. Has been going on for years but now worsening.  Started with foot and then legs and now the whole body.  Sx last 30 minutes and she has to get up and move around.  After she gets to sleep she is fine.  She reports a strong family hx of RLS.  Review of Systems  Constitutional: Negative for chills, fatigue and fever.  HENT: Negative for congestion, hearing loss, tinnitus, trouble swallowing and voice change.   Eyes: Negative for visual disturbance.  Respiratory: Negative for cough, chest tightness, shortness of breath  and wheezing.   Cardiovascular: Negative for chest pain, palpitations and leg swelling.  Gastrointestinal: Positive for abdominal pain (lower abdomen - started after Tramadol began) and anal bleeding. Negative for constipation, diarrhea and vomiting.  Endocrine: Negative for polydipsia and polyuria.  Genitourinary: Negative for dysuria, frequency, genital sores, vaginal bleeding and vaginal discharge.  Musculoskeletal: Positive for myalgias (RLS sx). Negative for arthralgias, gait problem and joint swelling.  Skin: Negative for color change and rash.  Neurological: Negative for dizziness, tremors, light-headedness and headaches.  Hematological: Negative for adenopathy. Does not bruise/bleed easily.  Psychiatric/Behavioral: Positive for depression. Negative for dysphoric mood and sleep disturbance. The patient is not nervous/anxious.     Patient Active Problem List   Diagnosis Date Noted  . Recurrent major depressive disorder, in partial remission ([place]) 02/03/2017  . Special screening for malignant neoplasms, colon   . Benign neoplasm of ascending colon   . Polyp of sigmoid colon   . Menopause syndrome 07/26/2016  . 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 Essential (primary) hypertension 07/20/2015  . 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 disorder 07/20/2015  . 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 DDD (degenerative disc disease), cervical 03/17/2014  . Hyperoxaluria 08/15/2013  . Calculus of kidney 02/13/2013    Allergies  Allergen Reactions  . Losartan Palpitations    Could feel heartbeat (strongly) in neck  . Oxycodone-Acetaminophen Itching       . Penicillins Rash  . Tape Rash    Skin irritation after  back surgery    Past Surgical History:  Procedure Laterality Date  . ABDOMINAL HYSTERECTOMY    . APPENDECTOMY    . BACK SURGERY  10/2005   lumbar  . COLONOSCOPY WITH  PROPOFOL N/A 09/23/2016   Procedure: COLONOSCOPY WITH PROPOFOL;  Surgeon: Lucilla Lame, MD;  Location: [facility];  Service: Endoscopy;  Laterality: N/A;  . GALLBLADDER SURGERY    . GASTRIC BYPASS  2008  . POLYPECTOMY  09/23/2016   Procedure: POLYPECTOMY;  Surgeon: Lucilla Lame, MD;  Location: [facility];  Service: Endoscopy;;    Social History   Tobacco Use  . Smoking status: Never Smoker  . Smokeless tobacco: Never Used  Substance Use Topics  . Alcohol use: No    Alcohol/week: 0.0 standard drinks  . Drug use: No     Medication list has been reviewed and updated.  Current Meds  Medication Sig  . busPIRone (BUSPAR) 10 MG tablet Take 1 tablet (10 mg total) by mouth at bedtime.  Marland Kitchen desvenlafaxine (PRISTIQ) 100 MG 24 hr tablet Take 1 tablet (100 mg total) by mouth daily.  . Ferrous Gluconate (IRON 27 PO) Take by mouth.  . fluticasone (FLONASE) 50 MCG/ACT nasal spray Place 2 sprays into both nostrils daily.  . furosemide (LASIX) 20 MG tablet Take 20 mg by mouth daily as needed.   . irbesartan (AVAPRO) 300 MG tablet Take 1 tablet (300 mg total) by mouth daily.  Marland Kitchen lamoTRIgine (LAMICTAL) 150 MG tablet Take 1 tablet (150 mg total) by mouth daily.  Marland Kitchen levothyroxine (SYNTHROID, LEVOTHROID) 75 MCG tablet Take 1 tablet (75 mcg total) by mouth daily before breakfast.  . Lysine HCl 1000 MG TABS Take by mouth.  . Magnesium 250 MG TABS Take by mouth.  . Nutritional Supplements (ESTROVEN PO) Take by mouth.  . phentermine 15 MG capsule Take by mouth.  . prazosin (MINIPRESS) 5 MG capsule Take 1 capsule (5 mg total) by mouth at bedtime.  . tamsulosin (FLOMAX) 0.4 MG CAPS capsule Take 0.4 mg by mouth daily as needed.   . topiramate (TOPAMAX) 100 MG tablet Take 2 tablets (200 mg total) by mouth daily.  . traMADol (ULTRAM) 50 MG tablet 1/2-1 po bid prn    PHQ 2/9 Scores 11/30/2018 06/27/2018 10/03/2017 02/03/2017  PHQ - 2 Score 0 0 0 1  PHQ- 9 Score - 0 0 -    Physical Exam Vitals  signs and nursing note reviewed.  Constitutional:      General: She is not in acute distress.    Appearance: She is well-developed.  HENT:     Head: Normocephalic and atraumatic.     Right Ear: Tympanic membrane and ear canal normal.     Left Ear: Tympanic membrane and ear canal normal.     Nose:     Right Sinus: No maxillary sinus tenderness.     Left Sinus: No maxillary sinus tenderness.     Mouth/Throat:     Pharynx: Uvula midline.  Eyes:     General: No scleral icterus.       Right eye: No discharge.        Left eye: No discharge.     Conjunctiva/sclera: Conjunctivae normal.  Neck:     Musculoskeletal: Normal range of motion. No erythema.     Thyroid: No thyromegaly.     Vascular: No carotid bruit.  Cardiovascular:     Rate and Rhythm: Normal rate and regular rhythm.     Pulses: Normal pulses.  Heart sounds: Normal heart sounds.  Pulmonary:     Effort: Pulmonary effort is normal. No respiratory distress.     Breath sounds: No wheezing.  Chest:     Breasts:        Right: No mass, nipple discharge, skin change or tenderness.        Left: No mass, nipple discharge, skin change or tenderness.  Abdominal:     General: Bowel sounds are normal.     Palpations: Abdomen is soft.     Tenderness: There is no abdominal tenderness.  Genitourinary:    Comments: Pt declines rectal exam Musculoskeletal: Normal range of motion.  Lymphadenopathy:     Cervical: No cervical adenopathy.  Skin:    General: Skin is warm and dry.     Findings: No rash.  Neurological:     Mental Status: She is alert and oriented to person, place, and time.     Cranial Nerves: No cranial nerve deficit.     Sensory: No sensory deficit.     Deep Tendon Reflexes: Reflexes are normal and symmetric.  Psychiatric:        Speech: Speech normal.        Behavior: Behavior normal.        Thought Content: Thought content normal.    Wt Readings from Last 3 Encounters:  11/30/18 173 lb (78.5 kg)  06/27/18  169 lb (76.7 kg)  11/28/17 187 lb (84.8 kg)     BP 132/70   Pulse 62   Ht 5\' 10"  (1.778 m)   Wt 173 lb (78.5 kg)   SpO2 100%   BMI 24.82 kg/m   Assessment and Plan: 1. Annual physical exam Normal exam Continue health diet Recommend regular exercise - Lipid panel - POCT urinalysis dipstick  2. Encounter for screening mammogram for breast cancer Pt will schedule - MM 3D SCREEN BREAST BILATERAL; Future  3. Essential (primary) hypertension controlled - CBC with Differential/Platelet - Comprehensive metabolic panel - irbesartan (AVAPRO) 300 MG tablet; Take 1 tablet (300 mg total) by mouth daily.  Dispense: 90 tablet; Refill: 3  4. Post-infectious hypothyroidism supplemented - TSH - levothyroxine (SYNTHROID, LEVOTHROID) 75 MCG tablet; Take 1 tablet (75 mcg total) by mouth daily before breakfast.  Dispense: 90 tablet; Refill: 3  5. Recurrent major depressive disorder, in partial remission [facility]) Doing well under psychiatry care  6. Bright red rectal bleeding - Ambulatory referral to General Surgery  7. Restless leg syndrome Begin low dose requip and increase as needed (pt to call for instructions) - rOPINIRole (REQUIP) 0.25 MG tablet; Take 1 tablet (0.25 mg total) by mouth at bedtime.  Dispense: 90 tablet; Refill: 0   Partially dictated using Editor, commissioning. Any errors are unintentional.  Halina Maidens, MD [facility]  11/30/2018

## 2018-11-30 NOTE — Patient Instructions (Signed)
Start Requip one tablet 2 hours before bed.  After 2 weeks, you can increase this to 2 tablets if needed.  Call for final dose when decided.

## 2018-12-01 LAB — COMPREHENSIVE METABOLIC PANEL
ALT: 23 IU/L (ref 0–32)
AST: 20 IU/L (ref 0–40)
Albumin/Globulin Ratio: 2.3 — ABNORMAL HIGH (ref 1.2–2.2)
Albumin: 4.3 g/dL (ref 3.8–4.9)
Alkaline Phosphatase: 149 IU/L — ABNORMAL HIGH (ref 39–117)
BUN/Creatinine Ratio: 16 (ref 9–23)
BUN: 12 mg/dL (ref 6–24)
Bilirubin Total: 0.2 mg/dL (ref 0.0–1.2)
CO2: 23 mmol/L (ref 20–29)
Calcium: 9.3 mg/dL (ref 8.7–10.2)
Chloride: 107 mmol/L — ABNORMAL HIGH (ref 96–106)
Creatinine, Ser: 0.74 mg/dL (ref 0.57–1.00)
GFR calc Af Amer: 105 mL/min/{1.73_m2} (ref >59)
GFR calc non Af Amer: 91 mL/min/{1.73_m2} (ref >59)
Globulin, Total: 1.9 g/dL (ref 1.5–4.5)
Glucose: 89 mg/dL (ref 65–99)
Potassium: 4.5 mmol/L (ref 3.5–5.2)
Sodium: 143 mmol/L (ref 134–144)
Total Protein: 6.2 g/dL (ref 6.0–8.5)

## 2018-12-01 LAB — CBC WITH DIFFERENTIAL/PLATELET
Basophils Absolute: 0 10*3/uL (ref 0.0–0.2)
Basos: 1 %
EOS (ABSOLUTE): 0.1 10*3/uL (ref 0.0–0.4)
Eos: 1 %
Hematocrit: 41.9 % (ref 34.0–46.6)
Hemoglobin: 14 g/dL (ref 11.1–15.9)
Immature Grans (Abs): 0 10*3/uL (ref 0.0–0.1)
Immature Granulocytes: 0 %
Lymphocytes Absolute: 1.5 10*3/uL (ref 0.7–3.1)
Lymphs: 27 %
MCH: 32.6 pg (ref 26.6–33.0)
MCHC: 33.4 g/dL (ref 31.5–35.7)
MCV: 97 fL (ref 79–97)
Monocytes Absolute: 0.4 10*3/uL (ref 0.1–0.9)
Monocytes: 6 %
Neutrophils Absolute: 3.6 10*3/uL (ref 1.4–7.0)
Neutrophils: 65 %
Platelets: 249 10*3/uL (ref 150–450)
RBC: 4.3 x10E6/uL (ref 3.77–5.28)
RDW: 12.1 % (ref 11.7–15.4)
WBC: 5.5 10*3/uL (ref 3.4–10.8)

## 2018-12-01 LAB — LIPID PANEL
Chol/HDL Ratio: 2.5 ratio (ref 0.0–4.4)
Cholesterol, Total: 221 mg/dL — ABNORMAL HIGH (ref 100–199)
HDL: 89 mg/dL (ref >39)
LDL Calculated: 120 mg/dL — ABNORMAL HIGH (ref 0–99)
Triglycerides: 62 mg/dL (ref 0–149)
VLDL Cholesterol Cal: 12 mg/dL (ref 5–40)

## 2018-12-01 LAB — TSH: TSH: 2.3 u[IU]/mL (ref 0.450–4.500)

## 2018-12-03 NOTE — Progress Notes (Signed)
Please call pt regarding labs.   Thank you.

## 2018-12-13 ENCOUNTER — Encounter: Payer: Self-pay | Admitting: *Deleted

## 2018-12-14 ENCOUNTER — Encounter: Payer: Self-pay | Admitting: *Deleted

## 2018-12-17 ENCOUNTER — Telehealth: Payer: Self-pay

## 2018-12-17 ENCOUNTER — Other Ambulatory Visit: Payer: Self-pay

## 2018-12-17 ENCOUNTER — Other Ambulatory Visit: Payer: Self-pay | Admitting: Internal Medicine

## 2018-12-17 DIAGNOSIS — G2581 Restless legs syndrome: Secondary | ICD-10-CM

## 2018-12-17 MED ORDER — ROPINIROLE HCL 0.25 MG PO TABS: 0.7500 mg | ORAL_TABLET | Freq: Every day | ORAL | 4 refills | Status: DC

## 2018-12-17 MED ORDER — ROPINIROLE HCL 0.25 MG PO TABS: 0.7500 mg | ORAL_TABLET | Freq: Every day | ORAL | 5 refills | Status: DC

## 2018-12-17 MED ORDER — ROPINIROLE HCL 0.25 MG PO TABS: 0.7500 mg | ORAL_TABLET | Freq: Every day | ORAL | 0 refills | Status: DC

## 2018-12-17 NOTE — Telephone Encounter (Signed)
New Rx sent in

## 2018-12-17 NOTE — Telephone Encounter (Signed)
Patient called and left Vm saying she was told to experiment with medication and find out what works best for her restless leg. She is up to 3 tabs daily and this is what helps her. She is now out of medication and wanted to know if you could call in refill for 3 tabs daily.  Please Advise. [facility])

## 2018-12-17 NOTE — Telephone Encounter (Signed)
Patient informed. Wanted 90 day sent to [facility] and 30 days sent to [facility]. Sent both.

## 2019-01-04 ENCOUNTER — Telehealth: Payer: Self-pay

## 2019-01-04 DIAGNOSIS — F411 Generalized anxiety disorder: Secondary | ICD-10-CM

## 2019-01-04 MED ORDER — PROPRANOLOL HCL 10 MG PO TABS: 10.0000 mg | ORAL_TABLET | Freq: Two times a day (BID) | ORAL | 1 refills | Status: DC | PRN

## 2019-01-04 NOTE — Telephone Encounter (Signed)
Medication management - Telephone call with patient stating she had restarted previous propranolol for anxiety as with the COVID-19 going around she is a lot more anxious. Patient wanted to let Dr. Gretel Acre know and is requesting a new order.  Patient actually wanted to know if there is anything else she thinks might help more and agreed to send message to provider.  Informed Dr. Gretel Acre is out of the office and will not be in for the next week so agreed to send request to another provider in the office as well to see if they had any suggestions and/or could assist with orders.

## 2019-01-04 NOTE — Telephone Encounter (Signed)
Returned call to patient.  Patient reports she is very anxious because of the corona virus outbreak.  She has been taking propanolol regularly since the past 2 weeks.  She would like a medication change.  Discussed either increasing her BuSpar or her propanolol with patient.  She reports she is trying to get off of some of the medication and would prefer going up on the propranolol at this point.  Will increase propranolol to 10 mg twice a day as needed for severe anxiety symptoms.  Discussed with patient to return to see Dr. Gretel Acre as soon as possible if she continues to have anxiety attacks.  Patient reports she already has a scheduled appointment and plans to keep it.  Also discussed with patient about referral for psychotherapy if she continues to struggle with significant anxiety symptoms.  She will discuss this with her provider.

## 2019-01-28 ENCOUNTER — Ambulatory Visit: Payer: 59 | Admitting: Psychiatry

## 2019-01-28 ENCOUNTER — Other Ambulatory Visit: Payer: Self-pay

## 2019-01-28 ENCOUNTER — Ambulatory Visit (INDEPENDENT_AMBULATORY_CARE_PROVIDER_SITE_OTHER): Payer: 59 | Admitting: Psychiatry

## 2019-01-28 ENCOUNTER — Encounter: Payer: Self-pay | Admitting: Psychiatry

## 2019-01-28 ENCOUNTER — Telehealth: Payer: Self-pay

## 2019-01-28 DIAGNOSIS — F5081 Binge eating disorder: Secondary | ICD-10-CM

## 2019-01-28 DIAGNOSIS — F411 Generalized anxiety disorder: Secondary | ICD-10-CM | POA: Diagnosis not present

## 2019-01-28 DIAGNOSIS — F330 Major depressive disorder, recurrent, mild: Secondary | ICD-10-CM | POA: Diagnosis not present

## 2019-01-28 MED ORDER — TRAZODONE HCL 100 MG PO TABS: 100.0000 mg | ORAL_TABLET | Freq: Every day | ORAL | 1 refills | Status: DC

## 2019-01-28 MED ORDER — PROPRANOLOL HCL 20 MG PO TABS: 20.0000 mg | ORAL_TABLET | Freq: Two times a day (BID) | ORAL | 3 refills | Status: DC

## 2019-01-28 MED ORDER — TOPIRAMATE 100 MG PO TABS: 200.0000 mg | ORAL_TABLET | Freq: Every day | ORAL | 2 refills | Status: DC

## 2019-01-28 MED ORDER — PRISTIQ 100 MG PO TB24: 100.0000 mg | ORAL_TABLET | Freq: Every day | ORAL | 3 refills | Status: DC

## 2019-01-28 NOTE — Telephone Encounter (Signed)
received a fax from [email or web address] about medcations pristiq needed a prior authorizations

## 2019-01-28 NOTE — Telephone Encounter (Signed)
went online to [email or web address] and did the prior authorization for the pristiq 100mg   This was approved from 12-29-18 to  01-28-20 .

## 2019-01-28 NOTE — Telephone Encounter (Signed)
pt states she has a coupon for $4 on pristiq but it needs to be namebrand. need a new rx for name brand pristiq sent to [facility].  also she states she is having alot of anxiety about this virus, and then they had a family member to commit suicide, and then her work has her working from home now and there is talk about possibly job closing down and she worried about not having a job.  She has a lot on her right now and she is taking 4 of her propranolol to help with her anxiety. She was wanting you to increase her rx from 2 pills to 4 .

## 2019-01-28 NOTE — Telephone Encounter (Signed)
Called patient back - states she already spoke to Dr.Faheem today and her needs have been taken care of. No further concerns.

## 2019-01-28 NOTE — Telephone Encounter (Signed)
faxed and confirmed rx approval to pharmacy.

## 2019-01-28 NOTE — Progress Notes (Signed)
Patient is a 58 year old female with history of depression anxiety who was followed for her medication and treatment. She reported that she has been having significant anxiety related to the recent Covid. She reported that her husband's cousin also committed suicide recently but she had long-standing history of mental illness and was [facility]. Patient reported that she was concerned about her and her husband was also reaching out to her. Patient stated that she has been experiencing increasing her anxiety symptoms as she has been working from home and feels that she is not responding well to staying at home. She stated that she likes dressing up on a daily basis.  She has increased the dose of propranolol as she has increase in anxiety and palpitations.we discussed about her medications. She wants to take propranolol up to 40 mg on a daily basis. She denied having any suicidal ideations or plans. Her husband remains supportive. She has been compliant with her symptoms. She will contact the staff if she notice worsening of her symptoms.  Plan.  Medications refilled. I have discussed the assessment and treatment plan with the patient. The patient was provided an opportunity to ask questions and all were answered. The patient agreed with the plan and demonstrated an understanding of the instructions.   The patient was advised to call back or seek an in-person evaluation if the symptoms worsen or if the condition fails to improve as anticipated.   I provided 10 minutes of non-face-to-face time during this encounter.

## 2019-02-04 ENCOUNTER — Ambulatory Visit: Payer: Self-pay | Admitting: Psychiatry

## 2019-03-25 ENCOUNTER — Encounter: Payer: Self-pay | Admitting: Psychiatry

## 2019-03-25 ENCOUNTER — Ambulatory Visit (INDEPENDENT_AMBULATORY_CARE_PROVIDER_SITE_OTHER): Payer: 59 | Admitting: Psychiatry

## 2019-03-25 ENCOUNTER — Other Ambulatory Visit: Payer: Self-pay

## 2019-03-25 DIAGNOSIS — F411 Generalized anxiety disorder: Secondary | ICD-10-CM | POA: Diagnosis not present

## 2019-03-25 DIAGNOSIS — F331 Major depressive disorder, recurrent, moderate: Secondary | ICD-10-CM

## 2019-03-25 DIAGNOSIS — F330 Major depressive disorder, recurrent, mild: Secondary | ICD-10-CM

## 2019-03-25 MED ORDER — LAMOTRIGINE 150 MG PO TABS: 150.0000 mg | ORAL_TABLET | Freq: Every day | ORAL | 2 refills | Status: DC

## 2019-03-25 MED ORDER — BUSPIRONE HCL 10 MG PO TABS: 10.0000 mg | ORAL_TABLET | Freq: Every day | ORAL | 1 refills | Status: DC

## 2019-03-25 MED ORDER — PROPRANOLOL HCL 20 MG PO TABS: 20.0000 mg | ORAL_TABLET | Freq: Three times a day (TID) | ORAL | 3 refills | Status: DC

## 2019-03-25 NOTE — Progress Notes (Signed)
[name], female   DOB: [birth year], 58 y.o.   MRN: [id number]   Patient is a 58 year old female with history of depression and anxiety who was followed up. She reported that her younger sister was recently diagnosed with breast cancer in March and she recently had her double mastectomy done. She was notice to have cancer spread to her lips [place] and now she's going through chemotherapy and radiation therapy. Patient staying with her and is in [place]. She stated that she is trying to be supportive to her sister. She stated that she is having panic attacks and is taking propranolol which is helpful. She stated that when she does not have a panic attack she does not use the medications. Patient feels that her current combination of medications are helpful. She denied having any mood swings anger anxiety or paranoia. She's able to contract for safety.  Plan. I will increase the propranolol 20 mg PO TID. Patient reported that she did not notice any change in her blood pressure while taking the medication.  Follow-up in two months earlier depending on her symptoms.  I discussed the assessment and treatment plan with the patient. The patient was provided an opportunity to ask questions and all were answered. The patient agreed with the plan and demonstrated an understanding of the instructions.   The patient was advised to call back or seek an in-person evaluation if the symptoms worsen or if the condition fails to improve as anticipated.   I provided 15 minutes of non-face-to-face time during this encounter.

## 2019-05-27 ENCOUNTER — Ambulatory Visit (INDEPENDENT_AMBULATORY_CARE_PROVIDER_SITE_OTHER): Payer: 59 | Admitting: Psychiatry

## 2019-05-27 ENCOUNTER — Other Ambulatory Visit: Payer: Self-pay

## 2019-05-27 ENCOUNTER — Encounter: Payer: Self-pay | Admitting: Psychiatry

## 2019-05-27 DIAGNOSIS — F319 Bipolar disorder, unspecified: Secondary | ICD-10-CM

## 2019-05-27 DIAGNOSIS — F330 Major depressive disorder, recurrent, mild: Secondary | ICD-10-CM

## 2019-05-27 DIAGNOSIS — F411 Generalized anxiety disorder: Secondary | ICD-10-CM | POA: Diagnosis not present

## 2019-05-27 MED ORDER — PROPRANOLOL HCL 20 MG PO TABS: 20.0000 mg | ORAL_TABLET | Freq: Three times a day (TID) | ORAL | 3 refills | Status: DC

## 2019-05-27 NOTE — Progress Notes (Signed)
[name], female   DOB: [birth year], 58 y.o.   MRN: [id number]   Patient is a 58 year old female with history of bipolar anxiety who was evaluated for follow-up. She reported that she has been doing well and is currently staying with her sister who is Undergoing chemotherapy since May. Patient reported that she is supporting her sister as she cannot leave her alone. She stated that she has been missing her home as she was planning for gardening and going to church during the summer but she cannot leave her sister at this time. She reported that she has been taking propranolol for her anxiety and it has been helpful. She reported that she does not want to stop taking the medications as they are controlling her symptoms. Her husband visits her often. She reported that she sleeps well at night. She currently denied having any suicidal or homicidal ideation or plans. Also the other  medications are coming through the mail order pharmacy. She is able to contract for safety at this time.  Plan Patient requested the refill of the propranolol to the local pharmacy. Other medications are available through the mail order pharmacy Follow-up in two months earlier depending on her symptoms.   I connected with patient via telemedicine application and verified that I am speaking with the correct person using two identifiers.  I discussed the limitations of evaluation and management by telemedicine and the availability of in person appointments. The patient expressed understanding and agreed to proceed.   I discussed the assessment and treatment plan with the patient. The patient was provided an opportunity to ask questions and all were answered. The patient agreed with the plan and demonstrated an understanding of the instructions.   The patient was advised to call back or seek an in-person evaluation if the symptoms worsen or if the condition fails to improve as anticipated.   I provided 15 minutes  of non-face-to-face time during this encounter.

## 2019-05-29 ENCOUNTER — Telehealth: Payer: Self-pay

## 2019-05-29 NOTE — Telephone Encounter (Signed)
pt called states she needs her medication sent in [facility]   she [facility]

## 2019-06-03 ENCOUNTER — Other Ambulatory Visit: Payer: Self-pay | Admitting: Psychiatry

## 2019-06-03 DIAGNOSIS — F331 Major depressive disorder, recurrent, moderate: Secondary | ICD-10-CM

## 2019-06-04 NOTE — Telephone Encounter (Signed)
pt states that [facility] and she needed medications sent so since she is out so she going to [facility] but she needs you to [facility] the [facility] because it is a whole lot cheaper.

## 2019-06-10 ENCOUNTER — Other Ambulatory Visit: Payer: Self-pay | Admitting: Psychiatry

## 2019-06-10 DIAGNOSIS — F331 Major depressive disorder, recurrent, moderate: Secondary | ICD-10-CM

## 2019-06-10 DIAGNOSIS — F411 Generalized anxiety disorder: Secondary | ICD-10-CM

## 2019-06-10 MED ORDER — PRISTIQ 100 MG PO TB24: 100.0000 mg | ORAL_TABLET | Freq: Every day | ORAL | 3 refills | Status: DC

## 2019-06-10 MED ORDER — TRAZODONE HCL 100 MG PO TABS: 100.0000 mg | ORAL_TABLET | Freq: Every day | ORAL | 1 refills | Status: DC

## 2019-06-10 MED ORDER — PROPRANOLOL HCL 20 MG PO TABS: 20.0000 mg | ORAL_TABLET | Freq: Three times a day (TID) | ORAL | 3 refills | Status: DC

## 2019-06-10 MED ORDER — TOPIRAMATE 100 MG PO TABS: 200.0000 mg | ORAL_TABLET | Freq: Every day | ORAL | 2 refills | Status: DC

## 2019-06-10 MED ORDER — BUSPIRONE HCL 10 MG PO TABS: 10.0000 mg | ORAL_TABLET | Freq: Every day | ORAL | 3 refills | Status: DC

## 2019-06-10 MED ORDER — LAMOTRIGINE 150 MG PO TABS: 150.0000 mg | ORAL_TABLET | Freq: Every day | ORAL | 2 refills | Status: DC

## 2019-06-10 NOTE — Telephone Encounter (Signed)
Meds refiled

## 2019-06-10 NOTE — Telephone Encounter (Signed)
Meds refilled.

## 2019-06-11 ENCOUNTER — Other Ambulatory Visit: Payer: Self-pay | Admitting: Psychiatry

## 2019-06-12 ENCOUNTER — Other Ambulatory Visit: Payer: Self-pay | Admitting: Psychiatry

## 2019-06-17 ENCOUNTER — Other Ambulatory Visit: Payer: Self-pay | Admitting: Internal Medicine

## 2019-06-17 ENCOUNTER — Telehealth: Payer: Self-pay

## 2019-06-17 DIAGNOSIS — F331 Major depressive disorder, recurrent, moderate: Secondary | ICD-10-CM

## 2019-06-17 MED ORDER — PRISTIQ 100 MG PO TB24: 100.0000 mg | ORAL_TABLET | Freq: Every day | ORAL | 0 refills | Status: DC

## 2019-06-17 MED ORDER — LAMOTRIGINE 150 MG PO TABS: 150.0000 mg | ORAL_TABLET | Freq: Every day | ORAL | 0 refills | Status: DC

## 2019-06-17 NOTE — Telephone Encounter (Signed)
pt called states that the pristiq from [facility] is about a $100 can you please send to [facility] in [facility]

## 2019-06-17 NOTE — Telephone Encounter (Signed)
Sent Pristiq to pharmacy - spoke to patient.

## 2019-06-17 NOTE — Telephone Encounter (Signed)
received a fax requesting a rx for pristiq 100mg  # 30

## 2019-06-17 NOTE — Telephone Encounter (Signed)
pt called again checking on the status of this request. pt states that she can not afford the medication threw the home deliever and that it needs to be sent to [facility].

## 2019-06-17 NOTE — Telephone Encounter (Signed)
Sending lamictal to [facility] due to concerns about cost at her other pharmacy.

## 2019-06-18 ENCOUNTER — Other Ambulatory Visit: Payer: Self-pay | Admitting: Internal Medicine

## 2019-06-18 DIAGNOSIS — I10 Essential (primary) hypertension: Secondary | ICD-10-CM

## 2019-07-15 ENCOUNTER — Ambulatory Visit (INDEPENDENT_AMBULATORY_CARE_PROVIDER_SITE_OTHER): Payer: 59 | Admitting: Psychiatry

## 2019-07-15 ENCOUNTER — Other Ambulatory Visit: Payer: Self-pay

## 2019-07-15 ENCOUNTER — Encounter: Payer: Self-pay | Admitting: Psychiatry

## 2019-07-15 DIAGNOSIS — F411 Generalized anxiety disorder: Secondary | ICD-10-CM | POA: Diagnosis not present

## 2019-07-15 DIAGNOSIS — F331 Major depressive disorder, recurrent, moderate: Secondary | ICD-10-CM

## 2019-07-15 NOTE — Progress Notes (Signed)
[name], female   DOB: 1961/04/17, 58 y.o.   MRN: [id number]   Patient is a 58 year old female with history of Depression anxiety who was followed up for medication management. She is currently in [place] Helping her sister recuperate from her cancer. Patient reported that her sister is doing well and will have her surgery and chemo next month. Patient reported that she is also working from home. Patient stated that her medications are helping her and she is getting most of her medications for the mail order pharmacy except  prazosin which is coming from the local pharmacy. Patient reported that her Current combination of medications is helping her and she has decreased the amount of propranolol and she only takes it on twice daily. Patient reported that she has noticed that her anxiety is improving. She denied having any side effects of the medications. Patient reported that she has been is stable on her current combination of medications.  Plan Continue medications as prescribed. She will follow-up in three months earlier depending on her symptoms.  I connected with patient via telemedicine application and verified that I am speaking with the correct person using two identifiers.  I discussed the limitations of evaluation and management by telemedicine and the availability of in person appointments. The patient expressed understanding and agreed to proceed.   I discussed the assessment and treatment plan with the patient. The patient was provided an opportunity to ask questions and all were answered. The patient agreed with the plan and demonstrated an understanding of the instructions.   The patient was advised to call back or seek an in-person evaluation if the symptoms worsen or if the condition fails to improve as anticipated.   I provided 15 minutes of non-face-to-face time during this encounter.

## 2019-07-18 ENCOUNTER — Ambulatory Visit
Admission: RE | Admit: 2019-07-18 | Discharge: 2019-07-18 | Disposition: A | Discharge status: Home / Self Care | Payer: 59 | Source: Ambulatory Visit | Attending: Internal Medicine | Admitting: Internal Medicine

## 2019-07-18 ENCOUNTER — Ambulatory Visit (INDEPENDENT_AMBULATORY_CARE_PROVIDER_SITE_OTHER): Payer: 59 | Admitting: Internal Medicine

## 2019-07-18 ENCOUNTER — Other Ambulatory Visit: Payer: Self-pay

## 2019-07-18 ENCOUNTER — Encounter: Payer: Self-pay | Admitting: Internal Medicine

## 2019-07-18 VITALS — BP 150/92 | Ht 70.0 in | Wt 161.0 lb

## 2019-07-18 DIAGNOSIS — J309 Allergic rhinitis, unspecified: Secondary | ICD-10-CM

## 2019-07-18 DIAGNOSIS — R05 Cough: Secondary | ICD-10-CM | POA: Diagnosis not present

## 2019-07-18 DIAGNOSIS — J019 Acute sinusitis, unspecified: Secondary | ICD-10-CM | POA: Diagnosis not present

## 2019-07-18 DIAGNOSIS — Z1231 Encounter for screening mammogram for malignant neoplasm of breast: Secondary | ICD-10-CM | POA: Diagnosis not present

## 2019-07-18 DIAGNOSIS — R059 Cough, unspecified: Secondary | ICD-10-CM

## 2019-07-18 MED ORDER — AZITHROMYCIN 250 MG PO TABS: ORAL_TABLET | ORAL | 0 refills | Status: AC

## 2019-07-18 MED ORDER — HYDROCOD POLST-CPM POLST ER 10-8 MG/5ML PO SUER: 5.0000 mL | Freq: Two times a day (BID) | ORAL | 0 refills | Status: DC

## 2019-07-18 MED ORDER — AZELASTINE HCL 0.1 % NA SOLN: 2.0000 | Freq: Two times a day (BID) | NASAL | 1 refills | Status: DC

## 2019-07-18 NOTE — Progress Notes (Signed)
Date:  07/18/2019   Name:  Toni Green   DOB:  06-Dec-1960   MRN:  [id number]  I connected with this patient, Toni Green, by telephone at the patient's home.  I verified that I am speaking with the correct person using two identifiers. This visit was conducted via telephone due to the Covid-19 outbreak from my office at [facility] in [place], [place]. I discussed the limitations, risks, security and privacy concerns of performing an evaluation and management service by telephone. I also discussed with the patient that there may be a patient responsible charge related to this service. The patient expressed understanding and agreed to proceed.  Chief Complaint: Cough (X 3 weeks. Started as a tickle in throat. Cough- unsure of color of production. Runny rose- dripping constantly. Throat drainage has moved to her chest. Coughing spells causes bad headaches. )  Cough This is a new problem. The current episode started 1 to 4 weeks ago. The problem has been unchanged. The problem occurs every few minutes. The cough is productive of sputum. Associated symptoms include postnasal drip, rhinorrhea and wheezing. Pertinent negatives include no chest pain, chills, ear pain, fever, headaches, sore throat or shortness of breath. She has tried OTC cough suppressant for the symptoms. The treatment provided mild relief. Her past medical history is significant for environmental allergies.    Review of Systems  Constitutional: Negative for chills and fever.  HENT: Positive for postnasal drip and rhinorrhea. Negative for ear pain and sore throat.   Respiratory: Positive for cough and wheezing. Negative for shortness of breath.   Cardiovascular: Negative for chest pain and palpitations.  Allergic/Immunologic: Positive for environmental allergies.  Neurological: Negative for dizziness and headaches.    Patient Active Problem List   Diagnosis Date Noted  . Restless leg syndrome 11/30/2018  . Bright red  rectal bleeding 11/30/2018  . Recurrent major depressive disorder, in partial remission ([place]) 02/03/2017  . Special screening for malignant neoplasms, colon   . Benign neoplasm of ascending colon   . Polyp of sigmoid colon   . Menopause syndrome 07/26/2016  . 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 Essential (primary) hypertension 07/20/2015  . 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 disorder 07/20/2015  . 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 DDD (degenerative disc disease), cervical 03/17/2014  . Hyperoxaluria 08/15/2013  . Calculus of kidney 02/13/2013    Allergies  Allergen Reactions  . Losartan Palpitations    Could feel heartbeat (strongly) in neck  . Oxycodone-Acetaminophen Itching       . Penicillins Rash  . Tape Rash    Skin irritation after back surgery    Past Surgical History:  Procedure Laterality Date  . ABDOMINAL HYSTERECTOMY    . APPENDECTOMY    . BACK SURGERY  10/2005   lumbar  . COLONOSCOPY WITH PROPOFOL N/A 09/23/2016   Procedure: COLONOSCOPY WITH PROPOFOL;  Surgeon: Lucilla Lame, MD;  Location: [facility];  Service: Endoscopy;  Laterality: N/A;  . GALLBLADDER SURGERY    . GASTRIC BYPASS  2008  . POLYPECTOMY  09/23/2016   Procedure: POLYPECTOMY;  Surgeon: Lucilla Lame, MD;  Location: [facility];  Service: Endoscopy;;    Social History   Tobacco Use  . Smoking status: Never Smoker  . Smokeless tobacco: Never Used  Substance Use Topics  . Alcohol use: No    Alcohol/week: 0.0 standard drinks  . Drug use: No  Medication list has been reviewed and updated.  Current Meds  Medication Sig  . busPIRone (BUSPAR) 10 MG tablet Take 1 tablet (10 mg total) by mouth at bedtime.  . Ferrous Gluconate (IRON 27 PO) Take by mouth.  . fluticasone (FLONASE) 50 MCG/ACT nasal spray Place 2 sprays into both nostrils daily.  . furosemide (LASIX)  20 MG tablet TAKE 1 TABLET DAILY (Patient taking differently: 3 times/day as needed-between meals & bedtime. )  . irbesartan (AVAPRO) 300 MG tablet Take 1 tablet (300 mg total) by mouth daily.  Marland Kitchen lamoTRIgine (LAMICTAL) 150 MG tablet Take 1 tablet (150 mg total) by mouth daily.  Marland Kitchen levothyroxine (SYNTHROID, LEVOTHROID) 75 MCG tablet Take 1 tablet (75 mcg total) by mouth daily before breakfast.  . Lysine HCl 1000 MG TABS Take by mouth.  . Magnesium 250 MG TABS Take by mouth.  . Nutritional Supplements (ESTROVEN PO) Take by mouth.  . prazosin (MINIPRESS) 5 MG capsule TAKE 1 CAPSULE AT BEDTIME  . PRISTIQ 100 MG 24 hr tablet Take 1 tablet (100 mg total) by mouth daily.  . propranolol (INDERAL) 20 MG tablet Take 1 tablet (20 mg total) by mouth 3 (three) times daily. For severe anxiety attacks  . rOPINIRole (REQUIP) 0.25 MG tablet Take 3 tablets (0.75 mg total) by mouth at bedtime.  . tamsulosin (FLOMAX) 0.4 MG CAPS capsule Take 0.4 mg by mouth daily as needed.   . topiramate (TOPAMAX) 100 MG tablet Take 2 tablets (200 mg total) by mouth daily.  . traMADol (ULTRAM) 50 MG tablet 1/2-1 po bid prn  . traZODone (DESYREL) 100 MG tablet Take 1 tablet (100 mg total) by mouth at bedtime.    PHQ 2/9 Scores 07/18/2019 07/18/2019 11/30/2018 06/27/2018  PHQ - 2 Score 0 0 0 0  PHQ- 9 Score 0 0 - 0    BP Readings from Last 3 Encounters:  07/18/19 (!) 150/92  11/30/18 132/70  06/27/18 114/80    Physical Exam Pulmonary:     Comments: No dyspnea noted during the call Occasional loose cough noted Neurological:     Mental Status: She is alert.  Psychiatric:        Attention and Perception: Attention normal.        Mood and Affect: Mood normal.        Speech: Speech normal.        Cognition and Memory: Cognition normal.     Wt Readings from Last 3 Encounters:  07/18/19 161 lb (73 kg)  11/30/18 173 lb (78.5 kg)  06/27/18 169 lb (76.7 kg)    BP (!) 150/92   Ht 5\' 10"  (1.778 m)   Wt 161 lb (73 kg)    BMI 23.10 kg/m   Assessment and Plan: 1. Allergic rhinitis, unspecified seasonality, unspecified trigger Add astelin spray to current regimen - azelastine (ASTELIN) 0.1 % nasal spray; Place 2 sprays into both nostrils 2 (two) times daily. Use in each nostril as directed  Dispense: 30 mL; Refill: 1  2. Acute non-recurrent sinusitis, unspecified location - azithromycin (ZITHROMAX Z-PAK) 250 MG tablet; UAD  Dispense: 6 each; Refill: 0  3. Cough - chlorpheniramine-HYDROcodone (TUSSIONEX PENNKINETIC ER) 10-8 MG/5ML SUER; Take 5 mLs by mouth 2 (two) times daily for 12 days.  Dispense: 115 mL; Refill: 0  I spent 12 minutes on this encounter. Partially dictated using Editor, commissioning. Any errors are unintentional.  Halina Maidens, MD [facility]  07/18/2019

## 2019-07-19 ENCOUNTER — Telehealth: Payer: Self-pay

## 2019-07-19 ENCOUNTER — Other Ambulatory Visit: Payer: Self-pay | Admitting: Internal Medicine

## 2019-07-19 ENCOUNTER — Telehealth: Payer: Self-pay | Admitting: Internal Medicine

## 2019-07-19 DIAGNOSIS — R059 Cough, unspecified: Secondary | ICD-10-CM

## 2019-07-19 DIAGNOSIS — R05 Cough: Secondary | ICD-10-CM

## 2019-07-19 MED ORDER — HYDROCODONE-HOMATROPINE 5-1.5 MG/5ML PO SYRP: 5.0000 mL | ORAL_SOLUTION | Freq: Four times a day (QID) | ORAL | 0 refills | Status: DC | PRN

## 2019-07-19 MED ORDER — HYDROCOD POLST-CPM POLST ER 10-8 MG/5ML PO SUER: 5.0000 mL | Freq: Two times a day (BID) | ORAL | 0 refills | Status: DC

## 2019-07-19 NOTE — Telephone Encounter (Signed)
April from [place] called out of [place] asking if we would like to pursue filling Tussinex while pt is on Tramadol for pain and would we change this to 5 days worth since this is the law in that state even for cough. Called and agreed and gave auth to fill and change to 5 days

## 2019-07-19 NOTE — Telephone Encounter (Signed)
Sent info to Dr. Army Melia this morning already. She will get to it by the end of the morning after seeing pts.  Thanks.

## 2019-07-19 NOTE — Telephone Encounter (Signed)
Toni Green went to pick up her cough medicine from [facility] and they said that they could not fill it for her, so she ask that since she will be traveling out of town for work, can you send the  chlorpheniramine-HYDROcodone ([place] ER) 10-8 MG/5ML Latanya Presser [id number]  Over to the [facility] on [address] [phone].

## 2019-07-19 NOTE — Telephone Encounter (Signed)
Pt called saying she is already on the way out of town and needs her Cough Syrup sent to the [facility] where she is going. She apologized but said the [facility] where she is going has Tussinex in stock. [facility] will not transfer a controlled substance to a different pharmacy.  Needs Tussinex sent to [facility], [address] [place], [place] [address].  Please advise.

## 2019-07-25 ENCOUNTER — Other Ambulatory Visit: Payer: Self-pay | Admitting: Internal Medicine

## 2019-07-25 ENCOUNTER — Telehealth: Payer: Self-pay

## 2019-07-25 DIAGNOSIS — R059 Cough, unspecified: Secondary | ICD-10-CM

## 2019-07-25 DIAGNOSIS — R05 Cough: Secondary | ICD-10-CM

## 2019-07-25 MED ORDER — HYDROCOD POLST-CPM POLST ER 10-8 MG/5ML PO SUER: 5.0000 mL | Freq: Two times a day (BID) | ORAL | 0 refills | Status: AC

## 2019-07-25 MED ORDER — AZITHROMYCIN 250 MG PO TABS: ORAL_TABLET | ORAL | 0 refills | Status: AC

## 2019-07-25 NOTE — Telephone Encounter (Signed)
Pt called saying she had a telephone visit last week on 10/2 for her cough. Was given a Z-pack and cough medication to help her sleep at night. The pharmacy was only able to fill 1/2 the Rx given because of the law about controlled substances.   Patient is calling saying she took her last dose of Z-pack this past Monday and her cough came back yesterday with a full vengeance. She is also out of the cough syrup.   Once to know if she can have one more refill of both Rx's to help knock the rest of her cold out. If so, she needs this sent to [facility] on [address] in [place], [place].

## 2019-09-04 ENCOUNTER — Telehealth: Payer: Self-pay

## 2019-09-04 DIAGNOSIS — F331 Major depressive disorder, recurrent, moderate: Secondary | ICD-10-CM

## 2019-09-04 MED ORDER — PRISTIQ 100 MG PO TB24: 100.0000 mg | ORAL_TABLET | Freq: Every day | ORAL | 0 refills | Status: DC

## 2019-09-04 NOTE — Telephone Encounter (Signed)
Done

## 2019-09-04 NOTE — Telephone Encounter (Signed)
pt states she needs name brand pristiq sent to the [facility] in [place] 

## 2019-10-14 ENCOUNTER — Encounter: Payer: Self-pay | Admitting: Psychiatry

## 2019-10-14 ENCOUNTER — Ambulatory Visit (INDEPENDENT_AMBULATORY_CARE_PROVIDER_SITE_OTHER): Payer: 59 | Admitting: Psychiatry

## 2019-10-14 ENCOUNTER — Other Ambulatory Visit: Payer: Self-pay

## 2019-10-14 DIAGNOSIS — F3342 Major depressive disorder, recurrent, in full remission: Secondary | ICD-10-CM | POA: Diagnosis not present

## 2019-10-14 DIAGNOSIS — F50819 Binge eating disorder, unspecified: Secondary | ICD-10-CM

## 2019-10-14 DIAGNOSIS — F331 Major depressive disorder, recurrent, moderate: Secondary | ICD-10-CM | POA: Diagnosis not present

## 2019-10-14 DIAGNOSIS — F5081 Binge eating disorder: Secondary | ICD-10-CM | POA: Diagnosis not present

## 2019-10-14 DIAGNOSIS — F411 Generalized anxiety disorder: Secondary | ICD-10-CM

## 2019-10-14 MED ORDER — BUSPIRONE HCL 10 MG PO TABS: 10.0000 mg | ORAL_TABLET | Freq: Every day | ORAL | 1 refills | Status: DC

## 2019-10-14 MED ORDER — PROPRANOLOL HCL 20 MG PO TABS: 20.0000 mg | ORAL_TABLET | Freq: Three times a day (TID) | ORAL | 1 refills | Status: DC

## 2019-10-14 MED ORDER — PRISTIQ 100 MG PO TB24: 100.0000 mg | ORAL_TABLET | Freq: Every day | ORAL | 1 refills | Status: DC

## 2019-10-14 MED ORDER — LAMOTRIGINE 150 MG PO TABS: 150.0000 mg | ORAL_TABLET | Freq: Every day | ORAL | 1 refills | Status: DC

## 2019-10-14 MED ORDER — TOPIRAMATE 100 MG PO TABS: 200.0000 mg | ORAL_TABLET | Freq: Every day | ORAL | 1 refills | Status: DC

## 2019-10-14 MED ORDER — TRAZODONE HCL 100 MG PO TABS: 100.0000 mg | ORAL_TABLET | Freq: Every day | ORAL | 1 refills | Status: DC

## 2019-10-14 NOTE — Progress Notes (Signed)
[facility] MD OP Progress Note  I connected with  Toni Green on 10/14/19 by a video enabled telemedicine application and verified that I am speaking with the correct person using two identifiers.   I discussed the limitations of evaluation and management by telemedicine. The patient expressed understanding and agreed to proceed. After initially connecting via telemedicine session was switched to phone encounter due to poor reception.    10/14/2019 8:29 AM Toni Green  MRN:  [id number]  Chief Complaint:  " I am doing well."  HPI: Patient reported that she has been doing well.  She informed that she was taking care of her sister in [place], [place] while she was dealing with cancer.  She spoke about her sister's condition and how it has impacted the whole family.  She informed that she returned back home from [place] about a month ago.  Since she returned back she has felt a bit down but she thinks it situational and she does not need any significant medication or other changes.  She denied any significant difficulty with sleep or appetite.  She denied any recent binge eating episodes.  She stated that her current medication regimen has been very helpful and she would like to continue the same. She has needed to take more as needed doses of propranolol over the past few weeks.  Visit Diagnosis:    ICD-10-CM   1. MDD (major depressive disorder), recurrent, in full remission ([place])  F33.42   2. Anxiety state  F41.1   3. Binge eating disorder  F50.81     Past Psychiatric History: MDD,anxiety, Binge eating d/o  Past Medical History:  Past Medical History:  Diagnosis Date  . Anxiety   . Arthritis    fingers  . 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 Bursitis    hips  . Bursitis, trochanteric 10/01/2014  . Cancer ([place])    skin ca  . Claustrophobia   . Complication of surgery [id number]   Overview:  Dilated pouch found on EGD done 12/2014   . Depression   . Hypertension   .  Inflammation of sacroiliac joint ([place]) 08/22/2014  . Kidney stones   . Motion sickness    back seat cars  . Seizures ([place]) 1985   x1, after labor/childbirth  . Thyroid disease     Past Surgical History:  Procedure Laterality Date  . ABDOMINAL HYSTERECTOMY    . APPENDECTOMY    . BACK SURGERY  10/2005   lumbar  . COLONOSCOPY WITH PROPOFOL N/A 09/23/2016   Procedure: COLONOSCOPY WITH PROPOFOL;  Surgeon: Lucilla Lame, MD;  Location: [facility];  Service: Endoscopy;  Laterality: N/A;  . GALLBLADDER SURGERY    . GASTRIC BYPASS  2008  . POLYPECTOMY  09/23/2016   Procedure: POLYPECTOMY;  Surgeon: Lucilla Lame, MD;  Location: [facility];  Service: Endoscopy;;    Family Psychiatric History:see below  Family History:  Family History  Problem Relation Age of Onset  . Depression Mother   . Hypertension Mother   . Depression Father   . Hypertension Father   . Depression Sister   . Breast cancer Sister 64  . Hypertension Maternal Grandfather   . Depression Maternal Grandmother   . Hypertension Maternal Grandmother   . Hypertension Paternal Grandfather   . Hypertension Paternal Grandmother     Social History:  Social History   Socioeconomic History  . Marital status: Married    Spouse name: Not on file  . Number of  children: Not on file  . Years of education: Not on file  . Highest education level: Not on file  Occupational History  . Not on file  Tobacco Use  . Smoking status: Never Smoker  . Smokeless tobacco: Never Used  Substance and Sexual Activity  . Alcohol use: No    Alcohol/week: 0.0 standard drinks  . Drug use: No  . Sexual activity: Not Currently  Other Topics Concern  . Not on file  Social History Narrative  . Not on file   Social Determinants of Health   Financial Resource Strain:   . Difficulty of Paying Living Expenses: Not on file  Food Insecurity:   . Worried About Charity fundraiser in the Last Year: Not on file  . Ran Out of Food  in the Last Year: Not on file  Transportation Needs:   . Lack of Transportation (Medical): Not on file  . Lack of Transportation (Non-Medical): Not on file  Physical Activity:   . Days of Exercise per Week: Not on file  . Minutes of Exercise per Session: Not on file  Stress:   . Feeling of Stress : Not on file  Social Connections:   . Frequency of Communication with Friends and Family: Not on file  . Frequency of Social Gatherings with Friends and Family: Not on file  . Attends Religious Services: Not on file  . Active Member of Clubs or Organizations: Not on file  . Attends Archivist Meetings: Not on file  . Marital Status: Not on file    Allergies:  Allergies  Allergen Reactions  . Losartan Palpitations    Could feel heartbeat (strongly) in neck  . Oxycodone-Acetaminophen Itching       . Penicillins Rash  . Tape Rash    Skin irritation after back surgery    Metabolic Disorder Labs: No results found for: HGBA1C, MPG No results found for: PROLACTIN Lab Results  Component Value Date   CHOL 221 (H) 11/30/2018   TRIG 62 11/30/2018   HDL 89 11/30/2018   CHOLHDL 2.5 11/30/2018   LDLCALC 120 (H) 11/30/2018   LDLCALC 100 (H) 11/01/2016   Lab Results  Component Value Date   TSH 2.300 11/30/2018   TSH 1.550 02/03/2017    Therapeutic Level Labs: No results found for: LITHIUM No results found for: VALPROATE No components found for:  CBMZ  Current Medications: Current Outpatient Medications  Medication Sig Dispense Refill  . azelastine (ASTELIN) 0.1 % nasal spray Place 2 sprays into both nostrils 2 (two) times daily. Use in each nostril as directed 30 mL 1  . busPIRone (BUSPAR) 10 MG tablet Take 1 tablet (10 mg total) by mouth at bedtime. 90 tablet 3  . Ferrous Gluconate (IRON 27 PO) Take by mouth.    . fluticasone (FLONASE) 50 MCG/ACT nasal spray Place 2 sprays into both nostrils daily. 16 g 6  . furosemide (LASIX) 20 MG tablet TAKE 1 TABLET DAILY (Patient  taking differently: 3 times/day as needed-between meals & bedtime. ) 90 tablet 3  . irbesartan (AVAPRO) 300 MG tablet Take 1 tablet (300 mg total) by mouth daily. 90 tablet 3  . lamoTRIgine (LAMICTAL) 150 MG tablet Take 1 tablet (150 mg total) by mouth daily. 90 tablet 0  . levothyroxine (SYNTHROID, LEVOTHROID) 75 MCG tablet Take 1 tablet (75 mcg total) by mouth daily before breakfast. 90 tablet 3  . Lysine HCl 1000 MG TABS Take by mouth.    . Magnesium 250  MG TABS Take by mouth.    . Nutritional Supplements (ESTROVEN PO) Take by mouth.    . prazosin (MINIPRESS) 5 MG capsule TAKE 1 CAPSULE AT BEDTIME 90 capsule 3  . PRISTIQ 100 MG 24 hr tablet Take 1 tablet (100 mg total) by mouth daily. 90 tablet 0  . propranolol (INDERAL) 20 MG tablet Take 1 tablet (20 mg total) by mouth 3 (three) times daily. For severe anxiety attacks 90 tablet 3  . rOPINIRole (REQUIP) 0.25 MG tablet Take 3 tablets (0.75 mg total) by mouth at bedtime. 90 tablet 0  . tamsulosin (FLOMAX) 0.4 MG CAPS capsule Take 0.4 mg by mouth daily as needed.     . topiramate (TOPAMAX) 100 MG tablet Take 2 tablets (200 mg total) by mouth daily. 180 tablet 2  . traMADol (ULTRAM) 50 MG tablet 1/2-1 po bid prn    . traZODone (DESYREL) 100 MG tablet Take 1 tablet (100 mg total) by mouth at bedtime. 90 tablet 1   No current facility-administered medications for this visit.      Psychiatric Specialty Exam: Review of Systems  There were no vitals taken for this visit.There is no height or weight on file to calculate BMI.  General Appearance: Fairly Groomed  Eye Contact:  Good  Speech:  Clear and Coherent and Normal Rate  Volume:  Normal  Mood:  Euthymic  Affect:  Congruent  Thought Process:  Goal Directed, Linear and Descriptions of Associations: Intact  Orientation:  Full (Time, Place, and Person)  Thought Content: Logical   Suicidal Thoughts:  No  Homicidal Thoughts:  No  Memory:  Recent;   Good Remote;   Good  Judgement:  Good   Insight:  Good  Psychomotor Activity:  Normal  Concentration:  Concentration: Good and Attention Span: Good  Recall:  Good  Fund of Knowledge: Good  Language: Good  Akathisia:  No  Handed:  Right  AIMS (if indicated): not done  Assets:  Communication Skills Desire for Improvement Financial Resources/Insurance Housing Social Support  ADL's:  Intact  Cognition: WNL  Sleep:  Good   Screenings: PHQ2-9     Office Visit from 07/18/2019 in [facility] Office Visit from 11/30/2018 in [facility] Office Visit from 06/27/2018 in [facility] Office Visit from 10/03/2017 in [facility] Office Visit from 02/03/2017 in [facility]  PHQ-2 Total Score  0  0  0  0  1  PHQ-9 Total Score  0  --  0  0  --       Assessment and Plan: Patient reported doing well on her current regimen.  1. MDD (major depressive disorder), recurrent, in full remission ([place])  - lamoTRIgine (LAMICTAL) 150 MG tablet; Take 1 tablet (150 mg total) by mouth daily.  Dispense: 90 tablet; Refill: 1 - PRISTIQ 100 MG 24 hr tablet; Take 1 tablet (100 mg total) by mouth daily.  Dispense: 90 tablet; Refill: 1 - topiramate (TOPAMAX) 100 MG tablet; Take 2 tablets (200 mg total) by mouth daily.  Dispense: 180 tablet; Refill: 1 - traZODone (DESYREL) 100 MG tablet; Take 1 tablet (100 mg total) by mouth at bedtime.  Dispense: 90 tablet; Refill: 1  2. Anxiety state  - busPIRone (BUSPAR) 10 MG tablet; Take 1 tablet (10 mg total) by mouth at bedtime.  Dispense: 90 tablet; Refill: 1 - propranolol (INDERAL) 20 MG tablet; Take 1 tablet (20 mg total) by mouth 3 (three) times daily. For severe anxiety attacks  Dispense: 90 tablet; Refill: 1  3. Binge eating disorder  - PRISTIQ 100 MG 24 hr tablet; Take 1 tablet (100 mg total) by mouth daily.  Dispense: 90 tablet; Refill: 1  Continue same medication regimen. Follow up in 3 months.    [place] Crane, MD 10/14/2019, 8:29 AM

## 2019-10-21 ENCOUNTER — Other Ambulatory Visit: Payer: Self-pay | Admitting: Internal Medicine

## 2019-10-21 DIAGNOSIS — G2581 Restless legs syndrome: Secondary | ICD-10-CM

## 2019-11-06 ENCOUNTER — Other Ambulatory Visit: Payer: Self-pay

## 2019-11-06 ENCOUNTER — Encounter: Payer: Self-pay | Admitting: Internal Medicine

## 2019-11-06 ENCOUNTER — Ambulatory Visit (INDEPENDENT_AMBULATORY_CARE_PROVIDER_SITE_OTHER): Payer: 59 | Admitting: Internal Medicine

## 2019-11-06 VITALS — BP 146/93 | Temp 98.0°F | Ht 70.0 in | Wt 161.0 lb

## 2019-11-06 DIAGNOSIS — R6889 Other general symptoms and signs: Secondary | ICD-10-CM

## 2019-11-06 DIAGNOSIS — I10 Essential (primary) hypertension: Secondary | ICD-10-CM | POA: Diagnosis not present

## 2019-11-06 NOTE — Progress Notes (Signed)
Date:  11/06/2019   Name:  Toni Green   DOB:  1961-03-25   MRN:  [id number]  I connected with this patient, Toni Green, by telephone at the patient's home.  I verified that I am speaking with the correct person using two identifiers. This visit was conducted via telephone due to the Covid-19 outbreak from my office at [facility] in [place], [place]. I discussed the limitations, risks, security and privacy concerns of performing an evaluation and management service by telephone. I also discussed with the patient that there may be a patient responsible charge related to this service. The patient expressed understanding and agreed to proceed.  Chief Complaint: Nausea (Fatigue, nausea, headache, and cold/hot chills. Started yesterday. No cough, or congestion. No SOB, and changes in taste or smell. )  Influenza This is a new problem. The current episode started yesterday. The problem has been resolved. Associated symptoms include chills, fatigue, headaches and nausea. Pertinent negatives include no chest pain, coughing or vomiting. She has tried rest for the symptoms. The treatment provided significant relief.  Hypertension This is a chronic problem. The problem is unchanged (but had some episodes of low bp). Associated symptoms include headaches. Pertinent negatives include no chest pain, palpitations or shortness of breath. Past treatments include angiotensin blockers, central alpha agonists and diuretics.    Lab Results  Component Value Date   CREATININE 0.74 11/30/2018   BUN 12 11/30/2018   NA 143 11/30/2018   K 4.5 11/30/2018   CL 107 (H) 11/30/2018   CO2 23 11/30/2018   Lab Results  Component Value Date   CHOL 221 (H) 11/30/2018   HDL 89 11/30/2018   LDLCALC 120 (H) 11/30/2018   TRIG 62 11/30/2018   CHOLHDL 2.5 11/30/2018   Lab Results  Component Value Date   TSH 2.300 11/30/2018   No results found for: HGBA1C   Review of Systems  Constitutional: Positive for  chills and fatigue.  HENT:       No loss of taste or smell  Respiratory: Negative for cough, chest tightness, shortness of breath and wheezing.   Cardiovascular: Negative for chest pain and palpitations.  Gastrointestinal: Positive for nausea. Negative for constipation, diarrhea and vomiting.  Neurological: Positive for headaches.    Patient Active Problem List   Diagnosis Date Noted  . MDD (major depressive disorder), recurrent, in full remission ([place]) 10/14/2019  . Anxiety state 10/14/2019  . Binge eating disorder 10/14/2019  . Restless leg syndrome 11/30/2018  . Bright red rectal bleeding 11/30/2018  . Recurrent major depressive disorder, in partial remission ([place]) 02/03/2017  . Special screening for malignant neoplasms, colon   . Benign neoplasm of ascending colon   . Polyp of sigmoid colon   . Menopause syndrome 07/26/2016  . 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 Essential (primary) hypertension 07/20/2015  . 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 disorder 07/20/2015  . 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 DDD (degenerative disc disease), cervical 03/17/2014  . Hyperoxaluria 08/15/2013  . Calculus of kidney 02/13/2013    Allergies  Allergen Reactions  . Losartan Palpitations    Could feel heartbeat (strongly) in neck  . Oxycodone-Acetaminophen Itching       . Penicillins Rash  . Tape Rash    Skin irritation after back surgery    Past Surgical History:  Procedure Laterality Date  . ABDOMINAL HYSTERECTOMY    . APPENDECTOMY    .  BACK SURGERY  10/2005   lumbar  . COLONOSCOPY WITH PROPOFOL N/A 09/23/2016   Procedure: COLONOSCOPY WITH PROPOFOL;  Surgeon: Lucilla Lame, MD;  Location: [facility];  Service: Endoscopy;  Laterality: N/A;  . GALLBLADDER SURGERY    . GASTRIC BYPASS  2008  . POLYPECTOMY  09/23/2016   Procedure: POLYPECTOMY;  Surgeon: Lucilla Lame,  MD;  Location: [facility];  Service: Endoscopy;;    Social History   Tobacco Use  . Smoking status: Never Smoker  . Smokeless tobacco: Never Used  Substance Use Topics  . Alcohol use: No    Alcohol/week: 0.0 standard drinks  . Drug use: No     Medication list has been reviewed and updated.  Current Meds  Medication Sig  . azelastine (ASTELIN) 0.1 % nasal spray Place 2 sprays into both nostrils 2 (two) times daily. Use in each nostril as directed  . busPIRone (BUSPAR) 10 MG tablet Take 1 tablet (10 mg total) by mouth at bedtime.  . Ferrous Gluconate (IRON 27 PO) Take by mouth.  . fluticasone (FLONASE) 50 MCG/ACT nasal spray Place 2 sprays into both nostrils daily.  . furosemide (LASIX) 20 MG tablet TAKE 1 TABLET DAILY (Patient taking differently: 3 times/day as needed-between meals & bedtime. )  . irbesartan (AVAPRO) 300 MG tablet Take 1 tablet (300 mg total) by mouth daily.  Marland Kitchen lamoTRIgine (LAMICTAL) 150 MG tablet Take 1 tablet (150 mg total) by mouth daily.  Marland Kitchen levothyroxine (SYNTHROID, LEVOTHROID) 75 MCG tablet Take 1 tablet (75 mcg total) by mouth daily before breakfast.  . Lysine HCl 1000 MG TABS Take by mouth.  . Magnesium 250 MG TABS Take by mouth.  . Nutritional Supplements (ESTROVEN PO) Take by mouth.  Marland Kitchen PRISTIQ 100 MG 24 hr tablet Take 1 tablet (100 mg total) by mouth daily.  . propranolol (INDERAL) 20 MG tablet Take 1 tablet (20 mg total) by mouth 3 (three) times daily. For severe anxiety attacks  . rOPINIRole (REQUIP) 0.25 MG tablet TAKE 3 TABLETS AT BEDTIME  . tamsulosin (FLOMAX) 0.4 MG CAPS capsule Take 0.4 mg by mouth daily as needed.   . topiramate (TOPAMAX) 100 MG tablet Take 2 tablets (200 mg total) by mouth daily.  . traMADol (ULTRAM) 50 MG tablet 1/2-1 po bid prn  . traZODone (DESYREL) 100 MG tablet Take 1 tablet (100 mg total) by mouth at bedtime.    PHQ 2/9 Scores 11/06/2019 07/18/2019 07/18/2019 11/30/2018  PHQ - 2 Score 0 0 0 0  PHQ- 9 Score 0 0 0 -      BP Readings from Last 3 Encounters:  11/06/19 (!) 146/93  07/18/19 (!) 150/92  11/30/18 132/70    Physical Exam Pulmonary:     Comments: No cough or dyspnea noted during the call Neurological:     Mental Status: She is alert.  Psychiatric:        Attention and Perception: Attention normal.        Mood and Affect: Mood normal.        Speech: Speech normal.        Cognition and Memory: Cognition normal.     Wt Readings from Last 3 Encounters:  11/06/19 161 lb (73 kg)  07/18/19 161 lb (73 kg)  11/30/18 173 lb (78.5 kg)    BP (!) 146/93   Temp 98 F (36.7 C) (Oral)   Ht 5\' 10"  (1.778 m)   Wt 161 lb (73 kg)   BMI 23.10 kg/m   Assessment  and Plan: 1. Flu-like symptoms Transient symptoms have resolved No treatment or evaluation at this time If symptoms recur, recommend Covid testing - instructions given  2. Essential (primary) hypertension Was very lightheaded with low BP when taking prazosin with avapro. Recommend continuing off of prazosin and monitoring BP regularly If it is not in the normal range 135/85 then call for follow up   Partially dictated using Dragon software. Any errors are unintentional.  Halina Maidens, MD [facility]  11/06/2019

## 2019-11-27 ENCOUNTER — Other Ambulatory Visit: Payer: Self-pay | Admitting: Internal Medicine

## 2019-11-27 DIAGNOSIS — I10 Essential (primary) hypertension: Secondary | ICD-10-CM

## 2019-11-28 NOTE — Telephone Encounter (Signed)
Could not LVM to call back

## 2019-12-09 ENCOUNTER — Telehealth: Payer: Self-pay

## 2019-12-09 DIAGNOSIS — F50819 Binge eating disorder, unspecified: Secondary | ICD-10-CM

## 2019-12-09 DIAGNOSIS — F3342 Major depressive disorder, recurrent, in full remission: Secondary | ICD-10-CM

## 2019-12-09 DIAGNOSIS — F5081 Binge eating disorder: Secondary | ICD-10-CM

## 2019-12-09 MED ORDER — PRISTIQ 100 MG PO TB24: 100.0000 mg | ORAL_TABLET | Freq: Every day | ORAL | 1 refills | Status: DC

## 2019-12-09 NOTE — Telephone Encounter (Signed)
pristiq is the only medication that needs to go to [facility] ( pt uses a [facility] which is alot cheaper ) pt will need a 30 day supply name brand only rx sent to [facility]. pt asked is she can have multiple refills also

## 2019-12-09 NOTE — Telephone Encounter (Signed)
Done

## 2019-12-26 ENCOUNTER — Other Ambulatory Visit: Payer: Self-pay | Admitting: Internal Medicine

## 2019-12-26 DIAGNOSIS — E033 Postinfectious hypothyroidism: Secondary | ICD-10-CM

## 2020-01-06 ENCOUNTER — Encounter: Payer: Self-pay | Admitting: Psychiatry

## 2020-01-06 ENCOUNTER — Encounter: Payer: Self-pay | Admitting: Internal Medicine

## 2020-01-06 ENCOUNTER — Ambulatory Visit (INDEPENDENT_AMBULATORY_CARE_PROVIDER_SITE_OTHER): Payer: 59 | Admitting: Internal Medicine

## 2020-01-06 ENCOUNTER — Other Ambulatory Visit: Payer: Self-pay

## 2020-01-06 ENCOUNTER — Ambulatory Visit (INDEPENDENT_AMBULATORY_CARE_PROVIDER_SITE_OTHER): Payer: 59 | Admitting: Psychiatry

## 2020-01-06 VITALS — BP 128/88 | HR 62 | Temp 98.2°F | Ht 70.0 in | Wt 170.0 lb

## 2020-01-06 DIAGNOSIS — F411 Generalized anxiety disorder: Secondary | ICD-10-CM | POA: Diagnosis not present

## 2020-01-06 DIAGNOSIS — F3342 Major depressive disorder, recurrent, in full remission: Secondary | ICD-10-CM | POA: Diagnosis not present

## 2020-01-06 DIAGNOSIS — Z0000 Encounter for general adult medical examination without abnormal findings: Secondary | ICD-10-CM

## 2020-01-06 DIAGNOSIS — E033 Postinfectious hypothyroidism: Secondary | ICD-10-CM | POA: Diagnosis not present

## 2020-01-06 DIAGNOSIS — F5081 Binge eating disorder: Secondary | ICD-10-CM

## 2020-01-06 DIAGNOSIS — I10 Essential (primary) hypertension: Secondary | ICD-10-CM | POA: Diagnosis not present

## 2020-01-06 DIAGNOSIS — G2581 Restless legs syndrome: Secondary | ICD-10-CM

## 2020-01-06 DIAGNOSIS — K644 Residual hemorrhoidal skin tags: Secondary | ICD-10-CM

## 2020-01-06 DIAGNOSIS — Z1231 Encounter for screening mammogram for malignant neoplasm of breast: Secondary | ICD-10-CM

## 2020-01-06 LAB — POCT URINALYSIS DIPSTICK
Blood, UA: NEGATIVE
Glucose, UA: NEGATIVE
Ketones, UA: NEGATIVE
Leukocytes, UA: NEGATIVE
Nitrite, UA: NEGATIVE
Protein, UA: NEGATIVE
Spec Grav, UA: 1.01 (ref 1.010–1.025)
Urobilinogen, UA: 0.2 E.U./dL
pH, UA: 7.5 (ref 5.0–8.0)

## 2020-01-06 MED ORDER — TRAZODONE HCL 100 MG PO TABS: 100.0000 mg | ORAL_TABLET | Freq: Every day | ORAL | 1 refills | Status: DC

## 2020-01-06 MED ORDER — LAMOTRIGINE 150 MG PO TABS: 150.0000 mg | ORAL_TABLET | Freq: Every day | ORAL | 1 refills | Status: DC

## 2020-01-06 MED ORDER — BUSPIRONE HCL 10 MG PO TABS: 10.0000 mg | ORAL_TABLET | Freq: Every day | ORAL | 1 refills | Status: DC

## 2020-01-06 MED ORDER — TOPIRAMATE 100 MG PO TABS: 200.0000 mg | ORAL_TABLET | Freq: Every day | ORAL | 1 refills | Status: DC

## 2020-01-06 MED ORDER — PROPRANOLOL HCL 20 MG PO TABS: 20.0000 mg | ORAL_TABLET | Freq: Three times a day (TID) | ORAL | 1 refills | Status: DC

## 2020-01-06 NOTE — Progress Notes (Signed)
[facility] MD OP Progress Note  I connected with  Toni Green on 01/06/20 by a video enabled telemedicine application and verified that I am speaking with the correct person using two identifiers.   I discussed the limitations of evaluation and management by telemedicine. The patient expressed understanding and agreed to proceed.   01/06/2020 8:43 AM Toni Green  MRN:  [id number]  Chief Complaint:  " I am doing well."  HPI: Patient reported that she has been doing well.  She informed that her sister is doing better too.  She has another surgery scheduled for July.  Overall her sister is in much better health and things are progressing well for her. Patient reported that things are going well however she has been under a lot of stress due to a few things that are going on.  She stated that they have a ongoing remodeling project at home and also things have been very stressful at work.  She is hoping that all this is temporary and very soon she will be able to feel better and move on.  She does not think she needs any medication adjustments and that this is part of life.    Visit Diagnosis:    ICD-10-CM   1. MDD (major depressive disorder), recurrent, in full remission ([place])  F33.42   2. Binge eating disorder  F50.81   3. Anxiety state  F41.1     Past Psychiatric History: MDD,anxiety, Binge eating d/o  Past Medical History:  Past Medical History:  Diagnosis Date  . Anxiety   . Arthritis    fingers  . 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 Bursitis    hips  . Bursitis, trochanteric 10/01/2014  . Cancer ([place])    skin ca  . Claustrophobia   . Complication of surgery [id number]   Overview:  Dilated pouch found on EGD done 12/2014   . Depression   . Hypertension   . Inflammation of sacroiliac joint ([place]) 08/22/2014  . Kidney stones   . Motion sickness    back seat cars  . Seizures ([place]) 1985   x1, after labor/childbirth  . Thyroid disease     Past Surgical History:   Procedure Laterality Date  . ABDOMINAL HYSTERECTOMY    . APPENDECTOMY    . BACK SURGERY  10/2005   lumbar  . COLONOSCOPY WITH PROPOFOL N/A 09/23/2016   Procedure: COLONOSCOPY WITH PROPOFOL;  Surgeon: Lucilla Lame, MD;  Location: [facility];  Service: Endoscopy;  Laterality: N/A;  . GALLBLADDER SURGERY    . GASTRIC BYPASS  2008  . POLYPECTOMY  09/23/2016   Procedure: POLYPECTOMY;  Surgeon: Lucilla Lame, MD;  Location: [facility];  Service: Endoscopy;;    Family Psychiatric History:see below  Family History:  Family History  Problem Relation Age of Onset  . Depression Mother   . Hypertension Mother   . Depression Father   . Hypertension Father   . Depression Sister   . Breast cancer Sister 72  . Hypertension Maternal Grandfather   . Depression Maternal Grandmother   . Hypertension Maternal Grandmother   . Hypertension Paternal Grandfather   . Hypertension Paternal Grandmother     Social History:  Social History   Socioeconomic History  . Marital status: Married    Spouse name: Not on file  . Number of children: Not on file  . Years of education: Not on file  . Highest education level: Not on file  Occupational History  .  Not on file  Tobacco Use  . Smoking status: Never Smoker  . Smokeless tobacco: Never Used  Substance and Sexual Activity  . Alcohol use: No    Alcohol/week: 0.0 standard drinks  . Drug use: No  . Sexual activity: Not Currently  Other Topics Concern  . Not on file  Social History Narrative  . Not on file   Social Determinants of Health   Financial Resource Strain:   . Difficulty of Paying Living Expenses:   Food Insecurity:   . Worried About Charity fundraiser in the Last Year:   . Arboriculturist in the Last Year:   Transportation Needs:   . Film/video editor (Medical):   Marland Kitchen Lack of Transportation (Non-Medical):   Physical Activity:   . Days of Exercise per Week:   . Minutes of Exercise per Session:   Stress:   .  Feeling of Stress :   Social Connections:   . Frequency of Communication with Friends and Family:   . Frequency of Social Gatherings with Friends and Family:   . Attends Religious Services:   . Active Member of Clubs or Organizations:   . Attends Archivist Meetings:   Marland Kitchen Marital Status:     Allergies:  Allergies  Allergen Reactions  . Losartan Palpitations    Could feel heartbeat (strongly) in neck  . Oxycodone-Acetaminophen Itching       . Penicillins Rash  . Tape Rash    Skin irritation after back surgery    Metabolic Disorder Labs: No results found for: HGBA1C, MPG No results found for: PROLACTIN Lab Results  Component Value Date   CHOL 221 (H) 11/30/2018   TRIG 62 11/30/2018   HDL 89 11/30/2018   CHOLHDL 2.5 11/30/2018   LDLCALC 120 (H) 11/30/2018   LDLCALC 100 (H) 11/01/2016   Lab Results  Component Value Date   TSH 2.300 11/30/2018   TSH 1.550 02/03/2017    Therapeutic Level Labs: No results found for: LITHIUM No results found for: VALPROATE No components found for:  CBMZ  Current Medications: Current Outpatient Medications  Medication Sig Dispense Refill  . azelastine (ASTELIN) 0.1 % nasal spray Place 2 sprays into both nostrils 2 (two) times daily. Use in each nostril as directed 30 mL 1  . busPIRone (BUSPAR) 10 MG tablet Take 1 tablet (10 mg total) by mouth at bedtime. 90 tablet 1  . Ferrous Gluconate (IRON 27 PO) Take by mouth.    . fluticasone (FLONASE) 50 MCG/ACT nasal spray Place 2 sprays into both nostrils daily. 16 g 6  . furosemide (LASIX) 20 MG tablet TAKE 1 TABLET DAILY (Patient taking differently: 3 times/day as needed-between meals & bedtime. ) 90 tablet 3  . irbesartan (AVAPRO) 300 MG tablet TAKE 1 TABLET DAILY 90 tablet 3  . lamoTRIgine (LAMICTAL) 150 MG tablet Take 1 tablet (150 mg total) by mouth daily. 90 tablet 1  . levothyroxine (SYNTHROID) 75 MCG tablet TAKE 1 TABLET DAILY BEFORE BREAKFAST 90 tablet 0  . Lysine HCl 1000 MG  TABS Take by mouth.    . Magnesium 250 MG TABS Take by mouth.    . Nutritional Supplements (ESTROVEN PO) Take by mouth.    . prazosin (MINIPRESS) 5 MG capsule TAKE 1 CAPSULE AT BEDTIME (Patient not taking: Reported on 11/06/2019) 90 capsule 3  . PRISTIQ 100 MG 24 hr tablet Take 1 tablet (100 mg total) by mouth daily. 90 tablet 1  . propranolol (INDERAL) 20  MG tablet Take 1 tablet (20 mg total) by mouth 3 (three) times daily. For severe anxiety attacks 90 tablet 1  . rOPINIRole (REQUIP) 0.25 MG tablet TAKE 3 TABLETS AT BEDTIME 180 tablet 5  . tamsulosin (FLOMAX) 0.4 MG CAPS capsule Take 0.4 mg by mouth daily as needed.     . topiramate (TOPAMAX) 100 MG tablet Take 2 tablets (200 mg total) by mouth daily. 180 tablet 1  . traMADol (ULTRAM) 50 MG tablet 1/2-1 po bid prn    . traZODone (DESYREL) 100 MG tablet Take 1 tablet (100 mg total) by mouth at bedtime. 90 tablet 1   No current facility-administered medications for this visit.      Psychiatric Specialty Exam: Review of Systems  There were no vitals taken for this visit.There is no height or weight on file to calculate BMI.  General Appearance: Well Groomed  Eye Contact:  Good  Speech:  Clear and Coherent and Normal Rate  Volume:  Normal  Mood:  Euthymic  Affect:  Congruent  Thought Process:  Goal Directed, Linear and Descriptions of Associations: Intact  Orientation:  Full (Time, Place, and Person)  Thought Content: Logical   Suicidal Thoughts:  No  Homicidal Thoughts:  No  Memory:  Recent;   Good Remote;   Good  Judgement:  Good  Insight:  Good  Psychomotor Activity:  Normal  Concentration:  Concentration: Good and Attention Span: Good  Recall:  Good  Fund of Knowledge: Good  Language: Good  Akathisia:  No  Handed:  Right  AIMS (if indicated): not done  Assets:  Communication Skills Desire for Improvement Financial Resources/Insurance Housing Social Support  ADL's:  Intact  Cognition: WNL  Sleep:  Good    Screenings: PHQ2-9     Office Visit from 11/06/2019 in [facility] Office Visit from 07/18/2019 in [facility] Office Visit from 11/30/2018 in [facility] Office Visit from 06/27/2018 in [facility] Office Visit from 10/03/2017 in [facility]  PHQ-2 Total Score  0  0  0  0  0  PHQ-9 Total Score  0  0  --  0  0       Assessment and Plan: Patient reported doing well on her current regimen.  1. MDD (major depressive disorder), recurrent, in full remission ([place])  - lamoTRIgine (LAMICTAL) 150 MG tablet; Take 1 tablet (150 mg total) by mouth daily.  Dispense: 90 tablet; Refill: 1 - PRISTIQ 100 MG 24 hr tablet; Take 1 tablet (100 mg total) by mouth daily.  Dispense: 90 tablet; Refill: 1 - topiramate (TOPAMAX) 100 MG tablet; Take 2 tablets (200 mg total) by mouth daily.  Dispense: 180 tablet; Refill: 1 - traZODone (DESYREL) 100 MG tablet; Take 1 tablet (100 mg total) by mouth at bedtime.  Dispense: 90 tablet; Refill: 1  2. Anxiety state  - busPIRone (BUSPAR) 10 MG tablet; Take 1 tablet (10 mg total) by mouth at bedtime.  Dispense: 90 tablet; Refill: 1 - propranolol (INDERAL) 20 MG tablet; Take 1 tablet (20 mg total) by mouth 3 (three) times daily. For severe anxiety attacks  Dispense: 90 tablet; Refill: 1  3. Binge eating disorder  - PRISTIQ 100 MG 24 hr tablet; Take 1 tablet (100 mg total) by mouth daily.  Dispense: 90 tablet; Refill: 1  Continue same medication regimen. Follow up in 3 months.    [place] Crane, MD 01/06/2020, 8:43 AM

## 2020-01-06 NOTE — Patient Instructions (Signed)
Increase the requip every three days until you reach a maximum of 3 mg or until you have benefit - then call me for the mg refill you need.

## 2020-01-06 NOTE — Progress Notes (Signed)
Date:  01/06/2020   Name:  Toni Green   DOB:  [birth year]   MRN:  [id number]   Chief Complaint: Annual Exam (Breast Exam. No pap- aged out.) and Restless Leg (Pt takes 3 tabs at bedtime and still having issues with restless leg. Wants to increase her medication.) Toni Green is a 59 y.o. female who presents today for her Complete Annual Exam. She feels fairly well. She reports exercising rarely. She reports she is sleeping fairly well. She denies breast issues.  Mammogram  07/2019 Pap discontinued Colonoscopy  09/2016 Immunization History  Administered Date(s) Administered  . Influenza-Unspecified 08/01/2015, 08/17/2017  . Tdap 05/25/2016    Hypertension This is a chronic problem. The problem is controlled. Pertinent negatives include no chest pain, headaches, palpitations or shortness of breath. Past treatments include angiotensin blockers and diuretics. The current treatment provides significant improvement. There are no compliance problems.  Identifiable causes of hypertension include a thyroid problem.  Thyroid Problem Presents for follow-up visit. Patient reports no anxiety, constipation, diarrhea, fatigue, palpitations or tremors. The symptoms have been stable.  Depression - now being followed by Psychiatry. She is taking Pristiq, topiramate, lamictal, trazodone and propranolol. Restless leg syndrome - has been on Requip for some time, 0.75 mg at HS.  Symptoms are worse recently and wants to increase her dose if possible. Rectal bleeding - she has an external hemorrhoid present for some months but recently has been bleeding so that she has to wear a pad.  It is not itching or painful but she has decided that she needs to have it addressed.  Lab Results  Component Value Date   CREATININE 0.74 11/30/2018   BUN 12 11/30/2018   NA 143 11/30/2018   K 4.5 11/30/2018   CL 107 (H) 11/30/2018   CO2 23 11/30/2018   Lab Results  Component Value Date   CHOL 221 (H) 11/30/2018    HDL 89 11/30/2018   LDLCALC 120 (H) 11/30/2018   TRIG 62 11/30/2018   CHOLHDL 2.5 11/30/2018   Lab Results  Component Value Date   TSH 2.300 11/30/2018   No results found for: HGBA1C Lab Results  Component Value Date   WBC 5.5 11/30/2018   HGB 14.0 11/30/2018   HCT 41.9 11/30/2018   MCV 97 11/30/2018   PLT 249 11/30/2018   Lab Results  Component Value Date   ALT 23 11/30/2018   AST 20 11/30/2018   ALKPHOS 149 (H) 11/30/2018   BILITOT 0.2 11/30/2018     Review of Systems  Constitutional: Negative for chills, fatigue and fever.  HENT: Negative for congestion, hearing loss, tinnitus, trouble swallowing and voice change.   Eyes: Negative for visual disturbance.  Respiratory: Negative for cough, chest tightness, shortness of breath and wheezing.   Cardiovascular: Negative for chest pain, palpitations and leg swelling.  Gastrointestinal: Positive for anal bleeding (from hemorrhoid). Negative for abdominal pain, constipation, diarrhea and vomiting.       Mild gerd  Endocrine: Negative for polydipsia and polyuria.  Genitourinary: Negative for dysuria, frequency, genital sores, vaginal bleeding and vaginal discharge.  Musculoskeletal: Positive for myalgias (restless leg sx nightly). Negative for arthralgias, gait problem and joint swelling.  Skin: Negative for color change and rash.  Neurological: Negative for dizziness, tremors, light-headedness and headaches.  Hematological: Negative for adenopathy. Does not bruise/bleed easily.  Psychiatric/Behavioral: Negative for dysphoric mood and sleep disturbance. The patient is not nervous/anxious.     Patient Active Problem List   Diagnosis  Date Noted  . MDD (major depressive disorder), recurrent, in full remission ([place]) 10/14/2019  . Anxiety state 10/14/2019  . Binge eating disorder 10/14/2019  . Restless leg syndrome 11/30/2018  . Bright red rectal bleeding 11/30/2018  . Special screening for malignant neoplasms, colon   .  Benign neoplasm of ascending colon   . Polyp of sigmoid colon   . Menopause syndrome 07/26/2016  . 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 Essential (primary) hypertension 07/20/2015  . 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 disorder 07/20/2015  . 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 DDD (degenerative disc disease), cervical 03/17/2014  . Hyperoxaluria 08/15/2013  . Calculus of kidney 02/13/2013    Allergies  Allergen Reactions  . Losartan Palpitations    Could feel heartbeat (strongly) in neck  . Oxycodone-Acetaminophen Itching       . Penicillins Rash  . Tape Rash    Skin irritation after back surgery    Past Surgical History:  Procedure Laterality Date  . ABDOMINAL HYSTERECTOMY    . APPENDECTOMY    . BACK SURGERY  10/2005   lumbar  . COLONOSCOPY WITH PROPOFOL N/A 09/23/2016   Procedure: COLONOSCOPY WITH PROPOFOL;  Surgeon: Lucilla Lame, MD;  Location: [facility];  Service: Endoscopy;  Laterality: N/A;  . GALLBLADDER SURGERY    . GASTRIC BYPASS  2008  . POLYPECTOMY  09/23/2016   Procedure: POLYPECTOMY;  Surgeon: Lucilla Lame, MD;  Location: [facility];  Service: Endoscopy;;    Social History   Tobacco Use  . Smoking status: Never Smoker  . Smokeless tobacco: Never Used  Substance Use Topics  . Alcohol use: No    Alcohol/week: 0.0 standard drinks  . Drug use: No     Medication list has been reviewed and updated.  Current Meds  Medication Sig  . azelastine (ASTELIN) 0.1 % nasal spray Place 2 sprays into both nostrils 2 (two) times daily. Use in each nostril as directed  . busPIRone (BUSPAR) 10 MG tablet Take 1 tablet (10 mg total) by mouth at bedtime. (Patient taking differently: Take 10 mg by mouth at bedtime. )  . Ferrous Gluconate (IRON 27 PO) Take by mouth.  . fluticasone (FLONASE) 50 MCG/ACT nasal spray Place 2 sprays into both nostrils  daily.  . furosemide (LASIX) 20 MG tablet TAKE 1 TABLET DAILY (Patient taking differently: 3 times/day as needed-between meals & bedtime. )  . irbesartan (AVAPRO) 300 MG tablet TAKE 1 TABLET DAILY  . lamoTRIgine (LAMICTAL) 150 MG tablet Take 1 tablet (150 mg total) by mouth daily.  Marland Kitchen levothyroxine (SYNTHROID) 75 MCG tablet TAKE 1 TABLET DAILY BEFORE BREAKFAST  . Lysine HCl 1000 MG TABS Take by mouth.  . Magnesium 250 MG TABS Take by mouth.  . Nutritional Supplements (ESTROVEN PO) Take by mouth.  Marland Kitchen PRISTIQ 100 MG 24 hr tablet Take 1 tablet (100 mg total) by mouth daily.  . propranolol (INDERAL) 20 MG tablet Take 1 tablet (20 mg total) by mouth 3 (three) times daily. For severe anxiety attacks (Patient taking differently: Take 20 mg by mouth 3 (three) times daily. For severe anxiety attacks PRN)  . rOPINIRole (REQUIP) 0.25 MG tablet TAKE 3 TABLETS AT BEDTIME  . tamsulosin (FLOMAX) 0.4 MG CAPS capsule Take 0.4 mg by mouth daily as needed.   . topiramate (TOPAMAX) 100 MG tablet Take 2 tablets (200 mg total) by mouth daily.  . traMADol (ULTRAM) 50 MG  tablet 1/2-1 po bid prn  . traZODone (DESYREL) 100 MG tablet Take 1 tablet (100 mg total) by mouth at bedtime.    PHQ 2/9 Scores 01/06/2020 11/06/2019 07/18/2019 07/18/2019  PHQ - 2 Score 1 0 0 0  PHQ- 9 Score 1 0 0 0    BP Readings from Last 3 Encounters:  01/06/20 128/88  11/06/19 (!) 146/93  07/18/19 (!) 150/92    Physical Exam Vitals and nursing note reviewed.  Constitutional:      General: She is not in acute distress.    Appearance: She is well-developed.  HENT:     Head: Normocephalic and atraumatic.     Right Ear: Tympanic membrane and ear canal normal.     Left Ear: Tympanic membrane and ear canal normal.     Nose:     Right Sinus: No maxillary sinus tenderness.     Left Sinus: No maxillary sinus tenderness.  Eyes:     General: No scleral icterus.       Right eye: No discharge.        Left eye: No discharge.      Conjunctiva/sclera: Conjunctivae normal.  Neck:     Thyroid: No thyromegaly.     Vascular: No carotid bruit.  Cardiovascular:     Rate and Rhythm: Normal rate and regular rhythm.     Pulses: Normal pulses.     Heart sounds: Normal heart sounds.  Pulmonary:     Effort: Pulmonary effort is normal. No respiratory distress.     Breath sounds: No wheezing.  Chest:     Breasts:        Right: No mass, nipple discharge, skin change or tenderness.        Left: No mass, nipple discharge, skin change or tenderness.  Abdominal:     General: Bowel sounds are normal.     Palpations: Abdomen is soft.     Tenderness: There is no abdominal tenderness.  Genitourinary:   Musculoskeletal:        General: Normal range of motion.     Cervical back: Normal range of motion. No erythema.  Lymphadenopathy:     Cervical: No cervical adenopathy.  Skin:    General: Skin is warm and dry.     Findings: No rash.  Neurological:     Mental Status: She is alert and oriented to person, place, and time.     Cranial Nerves: No cranial nerve deficit.     Sensory: No sensory deficit.     Deep Tendon Reflexes: Reflexes are normal and symmetric.  Psychiatric:        Attention and Perception: Attention normal.        Mood and Affect: Mood normal.        Speech: Speech normal.     Wt Readings from Last 3 Encounters:  01/06/20 170 lb (77.1 kg)  11/06/19 161 lb (73 kg)  07/18/19 161 lb (73 kg)    BP 128/88   Pulse 62   Temp 98.2 F (36.8 C) (Oral)   Ht 5\' 10"  (1.778 m)   Wt 170 lb (77.1 kg)   SpO2 100%   BMI 24.39 kg/m   Assessment and Plan: 1. Annual physical exam Normal exam - Lipid panel - POCT urinalysis dipstick  2. Encounter for screening mammogram for breast cancer [facility] in October - MM 3D SCREEN BREAST BILATERAL; Future  3. Essential (primary) hypertension Clinically stable exam with well controlled BP. Tolerating medications without side effects  at this time. Pt to  continue current regimen and low sodium diet; benefits of regular exercise as able discussed. - CBC with Differential/Platelet - Comprehensive metabolic panel  4. Post-infectious hypothyroidism supplemented - TSH + free T4  5. MDD (major depressive disorder), recurrent, in full remission ([place]) Clinically stable on current regimen with good control of symptoms, No SI or HI. Will continue current therapy. Continue follow up with Psych  6. External hemorrhoid, bleeding Needs treatment due to persistent bleeding - Ambulatory referral to Gastroenterology  7. Restless leg syndrome Increase Requip by 0.25 mg every three days until there is good control of symptoms or until max dose of 3 mg is reached. Call for the new prescription when needed.   Partially dictated using Editor, commissioning. Any errors are unintentional.  Halina Maidens, MD [facility]  01/06/2020

## 2020-01-07 LAB — CBC WITH DIFFERENTIAL/PLATELET
Basophils Absolute: 0.1 10*3/uL (ref 0.0–0.2)
Basos: 2 %
EOS (ABSOLUTE): 0.1 10*3/uL (ref 0.0–0.4)
Eos: 2 %
Hematocrit: 41.1 % (ref 34.0–46.6)
Hemoglobin: 14.2 g/dL (ref 11.1–15.9)
Immature Grans (Abs): 0 10*3/uL (ref 0.0–0.1)
Immature Granulocytes: 0 %
Lymphocytes Absolute: 1.5 10*3/uL (ref 0.7–3.1)
Lymphs: 31 %
MCH: 32.5 pg (ref 26.6–33.0)
MCHC: 34.5 g/dL (ref 31.5–35.7)
MCV: 94 fL (ref 79–97)
Monocytes Absolute: 0.4 10*3/uL (ref 0.1–0.9)
Monocytes: 8 %
Neutrophils Absolute: 2.8 10*3/uL (ref 1.4–7.0)
Neutrophils: 57 %
Platelets: 267 10*3/uL (ref 150–450)
RBC: 4.37 x10E6/uL (ref 3.77–5.28)
RDW: 11.8 % (ref 11.7–15.4)
WBC: 4.8 10*3/uL (ref 3.4–10.8)

## 2020-01-07 LAB — LIPID PANEL
Chol/HDL Ratio: 2.2 ratio (ref 0.0–4.4)
Cholesterol, Total: 221 mg/dL — ABNORMAL HIGH (ref 100–199)
HDL: 99 mg/dL (ref >39)
LDL Chol Calc (NIH): 111 mg/dL — ABNORMAL HIGH (ref 0–99)
Triglycerides: 61 mg/dL (ref 0–149)
VLDL Cholesterol Cal: 11 mg/dL (ref 5–40)

## 2020-01-07 LAB — COMPREHENSIVE METABOLIC PANEL
ALT: 78 IU/L — ABNORMAL HIGH (ref 0–32)
AST: 24 IU/L (ref 0–40)
Albumin/Globulin Ratio: 2.1 (ref 1.2–2.2)
Albumin: 4.5 g/dL (ref 3.8–4.9)
Alkaline Phosphatase: 294 IU/L — ABNORMAL HIGH (ref 39–117)
BUN/Creatinine Ratio: 10 (ref 9–23)
BUN: 8 mg/dL (ref 6–24)
Bilirubin Total: 0.4 mg/dL (ref 0.0–1.2)
CO2: 23 mmol/L (ref 20–29)
Calcium: 9.5 mg/dL (ref 8.7–10.2)
Chloride: 109 mmol/L — ABNORMAL HIGH (ref 96–106)
Creatinine, Ser: 0.84 mg/dL (ref 0.57–1.00)
GFR calc Af Amer: 89 mL/min/{1.73_m2} (ref >59)
GFR calc non Af Amer: 77 mL/min/{1.73_m2} (ref >59)
Globulin, Total: 2.1 g/dL (ref 1.5–4.5)
Glucose: 86 mg/dL (ref 65–99)
Potassium: 4.4 mmol/L (ref 3.5–5.2)
Sodium: 142 mmol/L (ref 134–144)
Total Protein: 6.6 g/dL (ref 6.0–8.5)

## 2020-01-07 LAB — TSH+FREE T4
Free T4: 1.17 ng/dL (ref 0.82–1.77)
TSH: 1.03 u[IU]/mL (ref 0.450–4.500)

## 2020-01-13 ENCOUNTER — Telehealth: Payer: Self-pay

## 2020-01-13 ENCOUNTER — Other Ambulatory Visit: Payer: Self-pay | Admitting: Internal Medicine

## 2020-01-13 DIAGNOSIS — G2581 Restless legs syndrome: Secondary | ICD-10-CM

## 2020-01-13 MED ORDER — ROPINIROLE HCL 0.25 MG PO TABS: 1.2500 mg | ORAL_TABLET | Freq: Every day | ORAL | 1 refills | Status: DC

## 2020-01-13 NOTE — Telephone Encounter (Signed)
Patient called to follow up from visit about restless leg medication. She said she feels comfortable taking 5 tablets at night and needs a [facility].  Please advise.  CM

## 2020-01-13 NOTE — Telephone Encounter (Signed)
I sent in the new amount of 5 pills per night to [facility].  I could not change it to be fewer pills because that strength is not available.

## 2020-01-13 NOTE — Telephone Encounter (Signed)
Patient informed. 

## 2020-02-11 ENCOUNTER — Telehealth: Payer: Self-pay

## 2020-02-11 NOTE — Telephone Encounter (Signed)
called [facility] to get information to get prior auth completed.

## 2020-02-11 NOTE — Telephone Encounter (Signed)
faxed and confirmed the pa - approval notice.

## 2020-02-11 NOTE — Telephone Encounter (Signed)
left message with info about approval.

## 2020-02-11 NOTE — Telephone Encounter (Signed)
went online and submitted the prior auth. - approved for 01-12-20 to 02-10-21 case id @ [id number]

## 2020-02-11 NOTE — Telephone Encounter (Signed)
pt  called upset stated that per [facility] that her insurance will not cover the pristiq anymore. that a prior auth might help

## 2020-02-20 ENCOUNTER — Ambulatory Visit (INDEPENDENT_AMBULATORY_CARE_PROVIDER_SITE_OTHER): Payer: 59 | Admitting: Gastroenterology

## 2020-02-20 ENCOUNTER — Other Ambulatory Visit: Payer: Self-pay

## 2020-02-20 ENCOUNTER — Encounter: Payer: Self-pay | Admitting: Gastroenterology

## 2020-02-20 VITALS — BP 158/85 | HR 63 | Temp 98.2°F | Wt 173.4 lb

## 2020-02-20 DIAGNOSIS — K625 Hemorrhage of anus and rectum: Secondary | ICD-10-CM

## 2020-02-20 DIAGNOSIS — K641 Second degree hemorrhoids: Secondary | ICD-10-CM | POA: Diagnosis not present

## 2020-02-20 MED ORDER — NA SULFATE-K SULFATE-MG SULF 17.5-3.13-1.6 GM/177ML PO SOLN
354.0000 mL | Freq: Once | ORAL | 0 refills | Status: AC
Start: 2020-02-20 — End: 2020-02-20

## 2020-02-20 NOTE — Progress Notes (Signed)
PROCEDURE NOTE: The patient presents with symptomatic grade 2 hemorrhoids, unresponsive to maximal medical therapy, requesting rubber band ligation of his/her hemorrhoidal disease.  All risks, benefits and alternative forms of therapy were described and informed consent was obtained.  In the Left Lateral Decubitus position (if anoscopy is performed) anoscopic examination revealed grade 2 hemorrhoids in the all position(s).   The decision was made to band the LL internal hemorrhoid, and the [facility] O'Regan System was used to perform band ligation without complication.  Digital anorectal examination was then performed to assure proper positioning of the band, and to adjust the banded tissue as required.  The patient was discharged home without pain or other issues.  Dietary and behavioral recommendations were given and (if necessary - prescriptions were given), along with follow-up instructions.  The patient will return 2 weeks for follow-up and possible additional banding as required.  No complications were encountered and the patient tolerated the procedure well.   

## 2020-02-20 NOTE — Progress Notes (Signed)
Cephas Darby, MD [address]  [place]  [place], [place] [address]  Main: [phone]  Fax: [phone]    Gastroenterology Consultation  Referring Provider:     Glean Hess, MD Primary Care Physician:  Glean Hess, MD Primary Gastroenterologist:  Dr. Cephas Darby Reason for Consultation:     Rectal bleeding        HPI:   Toni Green is a 59 y.o. female referred by Dr. Army Melia, Jesse Sans, MD  for consultation & management of rectal bleeding Patient reports that she has been experiencing chronic rectal bleeding, bright red blood per rectum.  She denies irregular bowel habits.  She denies any abdominal pain, nausea vomiting, abdominal bloating.  No evidence of anemia.  She denies any rectal discomfort  She does not smoke or drink alcohol  NSAIDs: None  Antiplts/Anticoagulants/Anti thrombotics: None  GI Procedures:  Colonoscopy 09/23/2016 - One 10 mm polyp in the ascending colon, removed with a hot snare. Resected and retrieved. Clip (MR conditional) was placed. - One 6 mm polyp in the sigmoid colon, removed with a cold snare. Resected and retrieved. - Non-bleeding internal hemorrhoids.  Tubular adenoma without dysplasia  Past Medical History:  Diagnosis Date  . Anxiety   . Arthritis    fingers  . 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 Bursitis    hips  . Bursitis, trochanteric 10/01/2014  . Cancer ([place])    skin ca  . Claustrophobia   . Complication of surgery [id number]   Overview:  Dilated pouch found on EGD done 12/2014   . Depression   . Hypertension   . Inflammation of sacroiliac joint ([place]) 08/22/2014  . Kidney stones   . Motion sickness    back seat cars  . Seizures ([place]) 1985   x1, after labor/childbirth  . Thyroid disease     Past Surgical History:  Procedure Laterality Date  . ABDOMINAL HYSTERECTOMY    . APPENDECTOMY    . BACK SURGERY  10/2005   lumbar  . COLONOSCOPY WITH PROPOFOL N/A 09/23/2016   Procedure:  COLONOSCOPY WITH PROPOFOL;  Surgeon: Lucilla Lame, MD;  Location: [facility];  Service: Endoscopy;  Laterality: N/A;  . GALLBLADDER SURGERY    . GASTRIC BYPASS  2008  . POLYPECTOMY  09/23/2016   Procedure: POLYPECTOMY;  Surgeon: Lucilla Lame, MD;  Location: [facility];  Service: Endoscopy;;     Current Outpatient Medications:  .  azelastine (ASTELIN) 0.1 % nasal spray, Place 2 sprays into both nostrils 2 (two) times daily. Use in each nostril as directed, Disp: 30 mL, Rfl: 1 .  busPIRone (BUSPAR) 10 MG tablet, Take 1 tablet (10 mg total) by mouth at bedtime. (Patient taking differently: Take 10 mg by mouth at bedtime. ), Disp: 90 tablet, Rfl: 1 .  Ferrous Gluconate (IRON 27 PO), Take by mouth., Disp: , Rfl:  .  fluticasone (FLONASE) 50 MCG/ACT nasal spray, Place 2 sprays into both nostrils daily., Disp: 16 g, Rfl: 6 .  furosemide (LASIX) 20 MG tablet, TAKE 1 TABLET DAILY (Patient taking differently: 3 times/day as needed-between meals & bedtime. ), Disp: 90 tablet, Rfl: 3 .  irbesartan (AVAPRO) 300 MG tablet, TAKE 1 TABLET DAILY, Disp: 90 tablet, Rfl: 3 .  lamoTRIgine (LAMICTAL) 150 MG tablet, Take 1 tablet (150 mg total) by mouth daily., Disp: 90 tablet, Rfl: 1 .  levothyroxine (SYNTHROID) 75 MCG tablet, TAKE 1 TABLET DAILY BEFORE BREAKFAST, Disp: 90 tablet, Rfl:  0 .  Lysine HCl 1000 MG TABS, Take by mouth., Disp: , Rfl:  .  Magnesium 250 MG TABS, Take by mouth., Disp: , Rfl:  .  Nutritional Supplements (ESTROVEN PO), Take by mouth., Disp: , Rfl:  .  PRISTIQ 100 MG 24 hr tablet, Take 1 tablet (100 mg total) by mouth daily., Disp: 90 tablet, Rfl: 1 .  propranolol (INDERAL) 20 MG tablet, Take 1 tablet (20 mg total) by mouth 3 (three) times daily. For severe anxiety attacks (Patient taking differently: Take 20 mg by mouth 3 (three) times daily. For severe anxiety attacks PRN), Disp: 90 tablet, Rfl: 1 .  rOPINIRole (REQUIP) 0.25 MG tablet, Take 5 tablets (1.25 mg total) by mouth at  bedtime., Disp: 450 tablet, Rfl: 1 .  tamsulosin (FLOMAX) 0.4 MG CAPS capsule, Take 0.4 mg by mouth daily as needed. , Disp: , Rfl:  .  topiramate (TOPAMAX) 100 MG tablet, Take 2 tablets (200 mg total) by mouth daily., Disp: 180 tablet, Rfl: 1 .  traMADol (ULTRAM) 50 MG tablet, 1/2-1 po bid prn, Disp: , Rfl:  .  traZODone (DESYREL) 100 MG tablet, Take 1 tablet (100 mg total) by mouth at bedtime., Disp: 90 tablet, Rfl: 1  Family History  Problem Relation Age of Onset  . Depression Mother   . Hypertension Mother   . Depression Father   . Hypertension Father   . Depression Sister   . Breast cancer Sister 46  . Hypertension Maternal Grandfather   . Depression Maternal Grandmother   . Hypertension Maternal Grandmother   . Hypertension Paternal Grandfather   . Hypertension Paternal Grandmother      Social History   Tobacco Use  . Smoking status: Never Smoker  . Smokeless tobacco: Never Used  Substance Use Topics  . Alcohol use: No    Alcohol/week: 0.0 standard drinks  . Drug use: No    Allergies as of 02/20/2020 - Review Complete 02/20/2020  Allergen Reaction Noted  . Losartan Palpitations 04/23/2015  . Oxycodone-acetaminophen Itching 07/07/2015  . Penicillins Rash 03/26/2015  . Tape Rash 09/19/2016    Review of Systems:    All systems reviewed and negative except where noted in HPI.   Physical Exam:  BP (!) 158/85 (BP Location: Left Arm, Patient Position: Sitting, Cuff Size: Normal)   Pulse 63   Temp 98.2 F (36.8 C) (Oral)   Wt 173 lb 6 oz (78.6 kg)   BMI 24.88 kg/m  No LMP recorded. Patient has had a hysterectomy.  General:   Alert,  Well-developed, well-nourished, pleasant and cooperative in NAD Head:  Normocephalic and atraumatic. Eyes:  Sclera clear, no icterus.   Conjunctiva pink. Ears:  Normal auditory acuity. Nose:  No deformity, discharge, or lesions. Mouth:  No deformity or lesions,oropharynx pink & moist. Neck:  Supple; no masses or  thyromegaly. Lungs:  Respirations even and unlabored.  Clear throughout to auscultation.   No wheezes, crackles, or rhonchi. No acute distress. Heart:  Regular rate and rhythm; no murmurs, clicks, rubs, or gallops. Abdomen:  Normal bowel sounds. Soft, non-tender and non-distended without masses, hepatosplenomegaly or hernias noted.  No guarding or rebound tenderness.   Rectal: Small perianal skin tag, palpable external hemorrhoids, nontender Msk:  Symmetrical without gross deformities. Good, equal movement & strength bilaterally. Pulses:  Normal pulses noted. Extremities:  No clubbing or edema.  No cyanosis. Neurologic:  Alert and oriented x3;  grossly normal neurologically. Skin:  Intact without significant lesions or rashes. No jaundice. Lymph Nodes:  No significant cervical adenopathy. Psych:  Alert and cooperative. Normal mood and affect.  Imaging Studies: No abdominal imaging  Assessment and Plan:   Toni Green is a 59 y.o. female with chronic painless rectal bleeding, perianal skin tags, external hemorrhoids  Painless rectal bleeding and external hemorrhoids Recommend diagnostic colonoscopy and patient is agreeable Also, recommend hemorrhoid ligation, risks and benefits discussed, consent obtained   Follow up in 2 to 3 weeks   Cephas Darby, MD

## 2020-02-27 ENCOUNTER — Telehealth: Payer: Self-pay

## 2020-02-27 ENCOUNTER — Other Ambulatory Visit: Payer: Self-pay

## 2020-02-27 ENCOUNTER — Other Ambulatory Visit: Payer: Self-pay | Admitting: Gastroenterology

## 2020-02-27 DIAGNOSIS — R300 Dysuria: Secondary | ICD-10-CM

## 2020-02-27 DIAGNOSIS — R102 Pelvic and perineal pain: Secondary | ICD-10-CM

## 2020-02-27 NOTE — Telephone Encounter (Signed)
Patient is calling because she had a banding procedure on 02/20/2020. She states she almost fainted in the room while the procedure was going on. She states all week she has had abdominal cramping and also has had nausea that comes and goes. Patient does not understand why she has have to have two more banding procedures. Patient states that if there are two more hemorrhoids and they are not bothering her then why does she have to go through this pain.

## 2020-02-27 NOTE — Telephone Encounter (Signed)
Patient is concerned about lower abdominal cramps similar to menstrual cramps, in the suprapubic region associated with burning urination.  She reports that her cramps worsened next day after banding procedure since 5/6.  She noticed a small amount of blood on Tuesday this week. She was also experiencing nausea this week along with lower abdominal cramps.  She denies constipation or further episodes of rectal bleeding.  She does report low-grade fever today 99.4.  I explained to her that sometimes when the band is placed higher up, she can experience such symptoms  Because of low-grade fever, explained to her that we need to rule out infection Recommend urine analysis as well as CBC with differential.  She will go to lab tomorrow in the morning.  Patient expressed understanding of the plan I will follow up on the labs, if she has significant leukocytosis and no evidence of UTI, I will start her on antibiotics and get an MRI pelvis In the interim, recommended her to take Tylenol alternating with Motrin every 4-6 hours  Cephas Darby, MD [facility]  [place], [place] [address]  Main: [phone]  Fax: [phone] Pager: [phone]

## 2020-02-29 LAB — CBC WITH DIFFERENTIAL/PLATELET
Basophils Absolute: 0 10*3/uL (ref 0.0–0.2)
Basos: 1 %
EOS (ABSOLUTE): 0 10*3/uL (ref 0.0–0.4)
Eos: 1 %
Hematocrit: 40.7 % (ref 34.0–46.6)
Hemoglobin: 13.9 g/dL (ref 11.1–15.9)
Immature Grans (Abs): 0 10*3/uL (ref 0.0–0.1)
Immature Granulocytes: 0 %
Lymphocytes Absolute: 1.2 10*3/uL (ref 0.7–3.1)
Lymphs: 26 %
MCH: 32.1 pg (ref 26.6–33.0)
MCHC: 34.2 g/dL (ref 31.5–35.7)
MCV: 94 fL (ref 79–97)
Monocytes Absolute: 0.5 10*3/uL (ref 0.1–0.9)
Monocytes: 10 %
Neutrophils Absolute: 3 10*3/uL (ref 1.4–7.0)
Neutrophils: 62 %
Platelets: 222 10*3/uL (ref 150–450)
RBC: 4.33 x10E6/uL (ref 3.77–5.28)
RDW: 11.3 % — ABNORMAL LOW (ref 11.7–15.4)
WBC: 4.7 10*3/uL (ref 3.4–10.8)

## 2020-02-29 LAB — UA/M W/RFLX CULTURE, ROUTINE
Bilirubin, UA: NEGATIVE
Glucose, UA: NEGATIVE
Leukocytes,UA: NEGATIVE
Nitrite, UA: NEGATIVE
Protein,UA: NEGATIVE
RBC, UA: NEGATIVE
Specific Gravity, UA: 1.021 (ref 1.005–1.030)
Urobilinogen, Ur: 1 mg/dL (ref 0.2–1.0)
pH, UA: 7 (ref 5.0–7.5)

## 2020-02-29 LAB — MICROSCOPIC EXAMINATION
Bacteria, UA: NONE SEEN
Casts: NONE SEEN /lpf
Epithelial Cells (non renal): NONE SEEN /hpf (ref 0–10)

## 2020-03-02 ENCOUNTER — Encounter: Payer: Self-pay | Admitting: Internal Medicine

## 2020-03-03 ENCOUNTER — Other Ambulatory Visit: Payer: Self-pay | Admitting: Internal Medicine

## 2020-03-03 DIAGNOSIS — N200 Calculus of kidney: Secondary | ICD-10-CM

## 2020-03-03 MED ORDER — TAMSULOSIN HCL 0.4 MG PO CAPS: 0.4000 mg | ORAL_CAPSULE | Freq: Every day | ORAL | 2 refills | Status: AC | PRN

## 2020-03-12 ENCOUNTER — Ambulatory Visit: Admit: 2020-03-12 | Payer: 59 | Admitting: Gastroenterology

## 2020-03-12 SURGERY — COLONOSCOPY WITH PROPOFOL
Anesthesia: General

## 2020-03-17 DIAGNOSIS — J189 Pneumonia, unspecified organism: Secondary | ICD-10-CM

## 2020-03-17 HISTORY — DX: Pneumonia, unspecified organism: J18.9

## 2020-03-18 ENCOUNTER — Ambulatory Visit: Payer: 59 | Admitting: Gastroenterology

## 2020-03-19 ENCOUNTER — Other Ambulatory Visit: Payer: Self-pay | Admitting: Internal Medicine

## 2020-03-19 DIAGNOSIS — E033 Postinfectious hypothyroidism: Secondary | ICD-10-CM

## 2020-03-20 NOTE — Telephone Encounter (Signed)
Requested Prescriptions  Pending Prescriptions Disp Refills  . levothyroxine (SYNTHROID) 75 MCG tablet [Pharmacy Med Name: L-THYROXINE (SYNTHROID) TABS 75MCG] 90 tablet 3    Sig: TAKE 1 TABLET DAILY BEFORE BREAKFAST     Endocrinology:  Hypothyroid Agents Failed - 03/19/2020  9:59 PM      Failed - TSH needs to be rechecked within 3 months after an abnormal result. Refill until TSH is due.      Passed - TSH in normal range and within 360 days    TSH  Date Value Ref Range Status  01/06/2020 1.030 0.450 - 4.500 uIU/mL Final         Passed - Valid encounter within last 12 months    Recent Outpatient Visits          2 months ago Annual physical exam   [facility] Glean Hess, MD   4 months ago Flu-like symptoms   [facility] Glean Hess, MD   8 months ago Allergic rhinitis, unspecified seasonality, unspecified trigger   [facility] Glean Hess, MD   1 year ago Annual physical exam   [facility] Glean Hess, MD   1 year ago Acute non-recurrent maxillary sinusitis   [facility] Glean Hess, MD

## 2020-03-23 ENCOUNTER — Telehealth: Payer: Self-pay | Admitting: Internal Medicine

## 2020-03-23 NOTE — Telephone Encounter (Signed)
Called pt back to let her know we didn't have any cancellations today that we could see if we can fit her in tomorrow or the next available appt. Transferred her so she could make and appt.  KP

## 2020-03-23 NOTE — Telephone Encounter (Signed)
Copied from [place] [phone]. Topic: General - Other >> Mar 23, 2020  8:55 AM Keene Breath wrote: Reason for CRM: Patient called and wanted a virtual appt. For today.  Checked schedule and there was nothing avail today.  She would like to be called if there is a cancellation for today.  Patient has a cough, no fever, a lot of congestion and has not had any vaccines.  CB# [id number]

## 2020-03-24 ENCOUNTER — Ambulatory Visit (INDEPENDENT_AMBULATORY_CARE_PROVIDER_SITE_OTHER): Payer: 59 | Admitting: Internal Medicine

## 2020-03-24 ENCOUNTER — Encounter: Payer: Self-pay | Admitting: Internal Medicine

## 2020-03-24 ENCOUNTER — Other Ambulatory Visit: Payer: Self-pay

## 2020-03-24 ENCOUNTER — Telehealth: Payer: Self-pay

## 2020-03-24 VITALS — BP 124/79 | Temp 98.7°F | Ht 70.0 in

## 2020-03-24 DIAGNOSIS — R059 Cough, unspecified: Secondary | ICD-10-CM

## 2020-03-24 DIAGNOSIS — J0100 Acute maxillary sinusitis, unspecified: Secondary | ICD-10-CM | POA: Diagnosis not present

## 2020-03-24 DIAGNOSIS — R05 Cough: Secondary | ICD-10-CM

## 2020-03-24 MED ORDER — AZITHROMYCIN 250 MG PO TABS: ORAL_TABLET | ORAL | 0 refills | Status: AC

## 2020-03-24 MED ORDER — HYDROCODONE-HOMATROPINE 5-1.5 MG/5ML PO SYRP: 5.0000 mL | ORAL_SOLUTION | Freq: Four times a day (QID) | ORAL | 0 refills | Status: AC | PRN

## 2020-03-24 NOTE — Progress Notes (Signed)
Date:  03/24/2020   Name:  Toni Green   DOB:  [birth year]   MRN:  [id number]  I connected with this patient, Toni Green, by telephone at the patient's home.  I verified that I am speaking with the correct person using two identifiers. This visit was conducted via telephone due to the Covid-19 outbreak from my office at [facility] in [place], [place]. I discussed the limitations, risks, security and privacy concerns of performing an evaluation and management service by telephone. I also discussed with the patient that there may be a patient responsible charge related to this service. The patient expressed understanding and agreed to proceed.  Chief Complaint: Cough (X4-5 days sinus infection, eyes hurt, head and chest congestion, gone down into chest, gone to ears as well, coughing up yellow mucous and coughing up green mucous, rattle in chest, fever started last night ranged from 99.7- 100.2, took tyl. and robitussin)  Cough This is a new problem. The current episode started in the past 7 days. The cough is productive of sputum. Associated symptoms include ear congestion, ear pain, a fever, nasal congestion and postnasal drip. Pertinent negatives include no chest pain, chills, headaches, sore throat, shortness of breath or wheezing. She has tried OTC cough suppressant for the symptoms. The treatment provided mild relief.    Lab Results  Component Value Date   CREATININE 0.84 01/06/2020   BUN 8 01/06/2020   NA 142 01/06/2020   K 4.4 01/06/2020   CL 109 (H) 01/06/2020   CO2 23 01/06/2020   Lab Results  Component Value Date   CHOL 221 (H) 01/06/2020   HDL 99 01/06/2020   LDLCALC 111 (H) 01/06/2020   TRIG 61 01/06/2020   CHOLHDL 2.2 01/06/2020   Lab Results  Component Value Date   TSH 1.030 01/06/2020   No results found for: HGBA1C Lab Results  Component Value Date   WBC 4.7 02/28/2020   HGB 13.9 02/28/2020   HCT 40.7 02/28/2020   MCV 94 02/28/2020   PLT 222  02/28/2020   Lab Results  Component Value Date   ALT 78 (H) 01/06/2020   AST 24 01/06/2020   ALKPHOS 294 (H) 01/06/2020   BILITOT 0.4 01/06/2020     Review of Systems  Constitutional: Positive for fever. Negative for chills and fatigue.  HENT: Positive for congestion, ear pain, postnasal drip and sinus pressure. Negative for sore throat and trouble swallowing.   Respiratory: Positive for cough. Negative for chest tightness, shortness of breath and wheezing.   Cardiovascular: Negative for chest pain and palpitations.  Neurological: Negative for dizziness, light-headedness and headaches.    Patient Active Problem List   Diagnosis Date Noted  . MDD (major depressive disorder), recurrent, in full remission ([place]) 10/14/2019  . Anxiety state 10/14/2019  . Binge eating disorder 10/14/2019  . Restless leg syndrome 11/30/2018  . Bright red rectal bleeding 11/30/2018  . S/P gastric bypass 08/07/2017  . Special screening for malignant neoplasms, colon   . Benign neoplasm of ascending colon   . Polyp of sigmoid colon   . Menopause syndrome 07/26/2016  . 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 Essential (primary) hypertension 07/20/2015  . 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 disorder 07/20/2015  . 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 DDD (degenerative disc disease), cervical 03/17/2014  . Hyperoxaluria 08/15/2013  . Calculus of kidney 02/13/2013    Allergies  Allergen  Reactions  . Losartan Palpitations    Could feel heartbeat (strongly) in neck  . Oxycodone-Acetaminophen Itching       . Penicillins Rash  . Tape Rash    Skin irritation after back surgery    Past Surgical History:  Procedure Laterality Date  . ABDOMINAL HYSTERECTOMY    . APPENDECTOMY    . BACK SURGERY  10/2005   lumbar  . COLONOSCOPY WITH PROPOFOL N/A 09/23/2016   Procedure: COLONOSCOPY WITH PROPOFOL;   Surgeon: Lucilla Lame, MD;  Location: [place];  Service: Endoscopy;  Laterality: N/A;  . GALLBLADDER SURGERY    . GASTRIC BYPASS  2008  . POLYPECTOMY  09/23/2016   Procedure: POLYPECTOMY;  Surgeon: Lucilla Lame, MD;  Location: [place];  Service: Endoscopy;;    Social History   Tobacco Use  . Smoking status: Never Smoker  . Smokeless tobacco: Never Used  Substance Use Topics  . Alcohol use: No    Alcohol/week: 0.0 standard drinks  . Drug use: No     Medication list has been reviewed and updated.  Current Meds  Medication Sig  . Ascorbic Acid (VITA-C PO) Take by mouth.  Marland Kitchen azelastine (ASTELIN) 0.1 % nasal spray Place 2 sprays into both nostrils 2 (two) times daily. Use in each nostril as directed  . busPIRone (BUSPAR) 10 MG tablet Take 1 tablet (10 mg total) by mouth at bedtime. (Patient taking differently: Take 10 mg by mouth at bedtime. )  . Cyanocobalamin (B-12 PO) Take by mouth.  . Ferrous Gluconate (IRON 27 PO) Take by mouth.  . fluticasone (FLONASE) 50 MCG/ACT nasal spray Place 2 sprays into both nostrils daily.  . furosemide (LASIX) 20 MG tablet TAKE 1 TABLET DAILY (Patient taking differently: 3 times/day as needed-between meals & bedtime. )  . irbesartan (AVAPRO) 300 MG tablet TAKE 1 TABLET DAILY (Patient taking differently: 3 (three) times a week. )  . lamoTRIgine (LAMICTAL) 150 MG tablet Take 1 tablet (150 mg total) by mouth daily.  Marland Kitchen levothyroxine (SYNTHROID) 75 MCG tablet TAKE 1 TABLET DAILY BEFORE BREAKFAST  . Magnesium 250 MG TABS Take by mouth.  . Nutritional Supplements (ESTROVEN PO) Take by mouth.  Marland Kitchen PRISTIQ 100 MG 24 hr tablet Take 1 tablet (100 mg total) by mouth daily.  . propranolol (INDERAL) 20 MG tablet Take 1 tablet (20 mg total) by mouth 3 (three) times daily. For severe anxiety attacks (Patient taking differently: Take 20 mg by mouth 3 (three) times daily. For severe anxiety attacks PRN)  . rOPINIRole (REQUIP) 0.25 MG tablet Take 5  tablets (1.25 mg total) by mouth at bedtime.  . tamsulosin (FLOMAX) 0.4 MG CAPS capsule Take 1 capsule (0.4 mg total) by mouth daily as needed.  . topiramate (TOPAMAX) 100 MG tablet Take 2 tablets (200 mg total) by mouth daily.  . traMADol (ULTRAM) 50 MG tablet 1/2-1 po bid prn  . traZODone (DESYREL) 100 MG tablet Take 1 tablet (100 mg total) by mouth at bedtime.  Marland Kitchen VITAMIN D PO Take by mouth.    PHQ 2/9 Scores 03/24/2020 01/06/2020 11/06/2019 07/18/2019  PHQ - 2 Score 0 1 0 0  PHQ- 9 Score 0 1 0 0    BP Readings from Last 3 Encounters:  03/24/20 124/79  02/20/20 (!) 158/85  01/06/20 128/88    Physical Exam Pulmonary:     Effort: Pulmonary effort is normal.     Comments:  Cough noted during the call Neurological:     Mental Status:  She is alert.  Psychiatric:        Attention and Perception: Attention normal.        Mood and Affect: Mood normal.        Speech: Speech normal.        Cognition and Memory: Cognition normal.     Wt Readings from Last 3 Encounters:  02/20/20 173 lb 6 oz (78.6 kg)  01/06/20 170 lb (77.1 kg)  11/06/19 161 lb (73 kg)    BP 124/79   Temp 98.7 F (37.1 C) (Oral)   Ht 5\' 10"  (1.778 m)   BMI 24.88 kg/m   Assessment and Plan: 1. Acute non-recurrent maxillary sinusitis Continue Zyrtec and flonase - azithromycin (ZITHROMAX Z-PAK) 250 MG tablet; UAD  Dispense: 6 each; Refill: 0  2. Cough Continue Daytime Mucinex DM Use hycodan at bedtime for cough - HYDROcodone-homatropine (HYCODAN) 5-1.5 MG/5ML syrup; Take 5 mLs by mouth every 6 (six) hours as needed for up to 10 days for cough.  Dispense: 120 mL; Refill: 0  I spent 10 minutes on this encounter. Partially dictated using Editor, commissioning. Any errors are unintentional.  Halina Maidens, MD [facility]  03/24/2020

## 2020-03-24 NOTE — Telephone Encounter (Signed)
This visit type is being conducted due to national recommendations for restrictions regarding the COVID- 19 Pandemic (e.g. social distancing) in effort to limit this patients exposure and mitigate transmission in our community. This visit type is felt to be most appropriate for this patient at this time. I connected with the patient today and received telephone consent from the patient and patient understand this consent will be good for 1 year.  KP  

## 2020-03-26 ENCOUNTER — Ambulatory Visit: Payer: Self-pay

## 2020-03-26 ENCOUNTER — Ambulatory Visit: Payer: Self-pay | Attending: Internal Medicine

## 2020-03-26 NOTE — Telephone Encounter (Signed)
Pt. Reports started feeling bad this week. Suspects COVID 10. Cough, fever 101, shortness of breath. "Can't take a deep breath." Difficulty speaking, starts coughing. Husband with pt. Instructed to call 911. Verbalizes understanding. Reason for Disposition . Sounds like a life-threatening emergency to the triager  Answer Assessment - Initial Assessment Questions 1. COVID-19 DIAGNOSIS: "Who made your Coronavirus (COVID-19) diagnosis?" "Was it confirmed by a positive lab test?" If not diagnosed by a HCP, ask "Are there lots of cases (community spread) where you live?" (See public health department website, if unsure)     No test yet 2. COVID-19 EXPOSURE: "Was there any known exposure to COVID before the symptoms began?" CDC Definition of close contact: within 6 feet (2 meters) for a total of 15 minutes or more over a 24-hour period.      Unsure 3. ONSET: "When did the COVID-19 symptoms start?"      This week 4. WORST SYMPTOM: "What is your worst symptom?" (e.g., cough, fever, shortness of breath, muscle aches)     Shortness of breath 5. COUGH: "Do you have a cough?" If so, ask: "How bad is the cough?"       Yes 6. FEVER: "Do you have a fever?" If so, ask: "What is your temperature, how was it measured, and when did it start?"     101 7. RESPIRATORY STATUS: "Describe your breathing?" (e.g., shortness of breath, wheezing, unable to speak)      Shortness of breath 8. BETTER-SAME-WORSE: "Are you getting better, staying the same or getting worse compared to yesterday?"  If getting worse, ask, "In what way?"     Worse 9. HIGH RISK DISEASE: "Do you have any chronic medical problems?" (e.g., asthma, heart or lung disease, weak immune system, obesity, etc.)     Yes 10. PREGNANCY: "Is there any chance you are pregnant?" "When was your last menstrual period?"       No 11. OTHER SYMPTOMS: "Do you have any other symptoms?"  (e.g., chills, fatigue, headache, loss of smell or taste, muscle pain, sore throat;  new loss of smell or taste especially support the diagnosis of COVID-19)       Cough, fever, shortness of breath  Protocols used: CORONAVIRUS (COVID-19) DIAGNOSED OR SUSPECTED-A-AH

## 2020-03-27 ENCOUNTER — Telehealth: Payer: Self-pay | Admitting: Internal Medicine

## 2020-03-27 NOTE — Telephone Encounter (Unsigned)
Copied from [place] [phone]. Topic: Appointment Scheduling - Scheduling Inquiry for Clinic >> Mar 27, 2020  2:29 PM Oneta Rack wrote: Patient will be discharged from [facility] 03/27/2020, as per hospital patient is in need of a hospital follow up next week with labs. [facility] will fax over discharge paperwork and please view on care everywhere if possible

## 2020-03-30 ENCOUNTER — Other Ambulatory Visit: Payer: Self-pay

## 2020-03-30 ENCOUNTER — Telehealth: Payer: Self-pay | Admitting: Internal Medicine

## 2020-03-30 DIAGNOSIS — K7689 Other specified diseases of liver: Secondary | ICD-10-CM

## 2020-03-30 NOTE — Telephone Encounter (Signed)
Called pt told her that it was too soon to see her for a hospital follow up that we needed to wait 1 month to be seen for pneumonia. Transferred her to the front to make an appt to get her labs done in one week to check her liver function. Pt verbalized understanding.  KP

## 2020-03-30 NOTE — Telephone Encounter (Signed)
Copied from [place] [id number]. Topic: Appointment Scheduling - Scheduling Inquiry for Clinic >> Mar 30, 2020  9:55 AM Sheran Luz wrote: Patient would like to be scheduled for in person [facility] appointment. Patient unable to pass decision tree screening due to being seen at hospital for pneumonia. Please advise.

## 2020-03-31 ENCOUNTER — Telehealth (INDEPENDENT_AMBULATORY_CARE_PROVIDER_SITE_OTHER): Payer: 59 | Admitting: Psychiatry

## 2020-03-31 ENCOUNTER — Other Ambulatory Visit: Payer: Self-pay

## 2020-03-31 ENCOUNTER — Telehealth: Payer: 59 | Admitting: Psychiatry

## 2020-03-31 ENCOUNTER — Encounter (HOSPITAL_COMMUNITY): Payer: Self-pay | Admitting: Psychiatry

## 2020-03-31 DIAGNOSIS — F411 Generalized anxiety disorder: Secondary | ICD-10-CM | POA: Diagnosis not present

## 2020-03-31 DIAGNOSIS — F3342 Major depressive disorder, recurrent, in full remission: Secondary | ICD-10-CM | POA: Diagnosis not present

## 2020-03-31 DIAGNOSIS — F5081 Binge eating disorder: Secondary | ICD-10-CM | POA: Diagnosis not present

## 2020-03-31 MED ORDER — LAMOTRIGINE 150 MG PO TABS: 150.0000 mg | ORAL_TABLET | Freq: Every day | ORAL | 1 refills | Status: DC

## 2020-03-31 MED ORDER — TRAZODONE HCL 100 MG PO TABS: 100.0000 mg | ORAL_TABLET | Freq: Every day | ORAL | 1 refills | Status: DC

## 2020-03-31 MED ORDER — TOPIRAMATE 100 MG PO TABS: 200.0000 mg | ORAL_TABLET | Freq: Every day | ORAL | 1 refills | Status: DC

## 2020-03-31 MED ORDER — PROPRANOLOL HCL 20 MG PO TABS: 20.0000 mg | ORAL_TABLET | Freq: Three times a day (TID) | ORAL | 1 refills | Status: DC

## 2020-03-31 NOTE — Progress Notes (Signed)
[facility] MD OP Progress Note  Virtual Visit via Video Note  I connected with Toni Green on 03/31/20 at  8:30 AM EDT by a video enabled telemedicine application and verified that I am speaking with the correct person using two identifiers.  Location: Patient: Home Provider: Clinic   I discussed the limitations of evaluation and management by telemedicine and the availability of in person appointments. The patient expressed understanding and agreed to proceed.  I provided 18 minutes of non-face-to-face time during this encounter.    03/31/2020 8:35 AM Toni Green  MRN:  [id number]  Chief Complaint:  " I just got out of the hospital."  HPI: Patient reported that she just got out of the hospital after admitted for pneumonia.  As per EMR, she was admitted on June 10 and discharged on June 11 for management of pneumonia secondary to parainfluenza virus and possible bacterial superinfection.  She was also noted to have elevated LFTs and troponin levels.  Cardiac MRI was normal and LFTs were noted to downtrend.  She was discharged on moxifloxacin antibiotic for management. She reported that she is feeling better day by day.  She stated that her cough and other symptoms are much better now.  She informed that so many of the other stressors of her ongoing have improved as well.  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 She denied any significant issues or concerns at this time. She informed that she takes propanolol only as needed for anxiety which is a few times per week.  She informed that she does take buspirone 10 mg at bedtime however wants to see if she can stop taking it as she is on a long list of medications. She would like to keep taking the Pristiq, Topamax and Lamictal combination.  She informed that Topamax helps her with her binge eating and she does not want to discontinue it.  She  also would like to keep taking trazodone to help her with sleep.  Visit Diagnosis:  No diagnosis found.  Past Psychiatric History: MDD,anxiety, Binge eating d/o  Past Medical History:  Past Medical History:  Diagnosis Date  . Anxiety   . Arthritis    fingers  . 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 Bursitis    hips  . Bursitis, trochanteric 10/01/2014  . Cancer ([place])    skin ca  . Claustrophobia   . Complication of surgery 06/20/8545   Overview:  Dilated pouch found on EGD done 12/2014   . Depression   . Hypertension   . Inflammation of sacroiliac joint ([place]) 08/22/2014  . Kidney stones   . Motion sickness    back seat cars  . Seizures ([place]) 1985   x1, after labor/childbirth  . Thyroid disease     Past Surgical History:  Procedure Laterality Date  . ABDOMINAL HYSTERECTOMY    . APPENDECTOMY    . BACK SURGERY  10/2005   lumbar  . COLONOSCOPY WITH PROPOFOL N/A 09/23/2016   Procedure: COLONOSCOPY WITH PROPOFOL;  Surgeon: Lucilla Lame, MD;  Location: [place];  Service: Endoscopy;  Laterality: N/A;  . GALLBLADDER SURGERY    . GASTRIC BYPASS  2008  . POLYPECTOMY  09/23/2016   Procedure: POLYPECTOMY;  Surgeon: Lucilla Lame, MD;  Location: [facility] [facility];  Service: Endoscopy;;    Family Psychiatric History:see below  Family History:  Family History  Problem Relation Age of Onset  . Depression Mother   . Hypertension Mother   . Depression Father   . Hypertension Father   . Depression Sister   . Breast cancer Sister 74  . Hypertension Maternal Grandfather   . Depression Maternal Grandmother   . Hypertension Maternal Grandmother   . Hypertension Paternal Grandfather   . Hypertension Paternal Grandmother     Social History:  Social History   Socioeconomic History  . Marital status: Married    Spouse name: Not on file  . Number of children: Not on file  . Years of education: Not on file  . Highest education level: Not on file   Occupational History  . Not on file  Tobacco Use  . Smoking status: Never Smoker  . Smokeless tobacco: Never Used  Substance and Sexual Activity  . Alcohol use: No    Alcohol/week: 0.0 standard drinks  . Drug use: No  . Sexual activity: Not Currently  Other Topics Concern  . Not on file  Social History Narrative  . Not on file   Social Determinants of Health   Financial Resource Strain:   . Difficulty of Paying Living Expenses:   Food Insecurity:   . Worried About Charity fundraiser in the Last Year:   . Arboriculturist in the Last Year:   Transportation Needs:   . Film/video editor (Medical):   Marland Kitchen Lack of Transportation (Non-Medical):   Physical Activity:   . Days of Exercise per Week:   . Minutes of Exercise per Session:   Stress:   . Feeling of Stress :   Social Connections:   . Frequency of Communication with Friends and Family:   . Frequency of Social Gatherings with Friends and Family:   . Attends Religious Services:   . Active Member of Clubs or Organizations:   . Attends Archivist Meetings:   Marland Kitchen Marital Status:     Allergies:  Allergies  Allergen Reactions  . Losartan Palpitations    Could feel heartbeat (strongly) in neck  . Oxycodone-Acetaminophen Itching       . Penicillins Rash  . Tape Rash    Skin irritation after back surgery    Metabolic Disorder Labs: No results found for: HGBA1C, MPG No results found for: PROLACTIN Lab Results  Component Value Date   CHOL 221 (H) 01/06/2020   TRIG 61 01/06/2020   HDL 99 01/06/2020   CHOLHDL 2.2 01/06/2020   LDLCALC 111 (H) 01/06/2020   LDLCALC 120 (H) 11/30/2018   Lab Results  Component Value Date   TSH 1.030 01/06/2020   TSH 2.300 11/30/2018    Therapeutic Level Labs: No results found for: LITHIUM No results found for: VALPROATE No components found for:  CBMZ  Current Medications: Current Outpatient Medications  Medication Sig Dispense Refill  . Ascorbic Acid (VITA-C PO)  Take by mouth.    Marland Kitchen azelastine (ASTELIN) 0.1 % nasal spray Place 2 sprays into both nostrils 2 (two) times daily. Use in each nostril as directed 30 mL 1  . busPIRone (BUSPAR) 10 MG tablet Take 1 tablet (10 mg total) by mouth at bedtime. (Patient taking differently: Take 10 mg by mouth at bedtime. ) 90 tablet 1  . Cyanocobalamin (B-12 PO) Take by mouth.    . Ferrous Gluconate (IRON 27 PO) Take by mouth.    . fluticasone (FLONASE) 50 MCG/ACT nasal spray Place 2 sprays into both nostrils daily. 16 g  6  . furosemide (LASIX) 20 MG tablet TAKE 1 TABLET DAILY (Patient taking differently: 3 times/day as needed-between meals & bedtime. ) 90 tablet 3  . HYDROcodone-homatropine (HYCODAN) 5-1.5 MG/5ML syrup Take 5 mLs by mouth every 6 (six) hours as needed for up to 10 days for cough. 120 mL 0  . irbesartan (AVAPRO) 300 MG tablet TAKE 1 TABLET DAILY (Patient taking differently: 3 (three) times a week. ) 90 tablet 3  . lamoTRIgine (LAMICTAL) 150 MG tablet Take 1 tablet (150 mg total) by mouth daily. 90 tablet 1  . levothyroxine (SYNTHROID) 75 MCG tablet TAKE 1 TABLET DAILY BEFORE BREAKFAST 90 tablet 3  . Magnesium 250 MG TABS Take by mouth.    . Nutritional Supplements (ESTROVEN PO) Take by mouth.    Marland Kitchen PRISTIQ 100 MG 24 hr tablet Take 1 tablet (100 mg total) by mouth daily. 90 tablet 1  . propranolol (INDERAL) 20 MG tablet Take 1 tablet (20 mg total) by mouth 3 (three) times daily. For severe anxiety attacks (Patient taking differently: Take 20 mg by mouth 3 (three) times daily. For severe anxiety attacks PRN) 90 tablet 1  . rOPINIRole (REQUIP) 0.25 MG tablet Take 5 tablets (1.25 mg total) by mouth at bedtime. 450 tablet 1  . tamsulosin (FLOMAX) 0.4 MG CAPS capsule Take 1 capsule (0.4 mg total) by mouth daily as needed. 30 capsule 2  . topiramate (TOPAMAX) 100 MG tablet Take 2 tablets (200 mg total) by mouth daily. 180 tablet 1  . traMADol (ULTRAM) 50 MG tablet 1/2-1 po bid prn    . traZODone (DESYREL) 100 MG  tablet Take 1 tablet (100 mg total) by mouth at bedtime. 90 tablet 1  . VITAMIN D PO Take by mouth.     No current facility-administered medications for this visit.      Psychiatric Specialty Exam: Review of Systems  There were no vitals taken for this visit.There is no height or weight on file to calculate BMI.  General Appearance: Fairly Groomed  Eye Contact:  Good  Speech:  Clear and Coherent and Normal Rate  Volume:  Normal  Mood:  Euthymic  Affect:  Congruent  Thought Process:  Goal Directed, Linear and Descriptions of Associations: Intact  Orientation:  Full (Time, Place, and Person)  Thought Content: Logical   Suicidal Thoughts:  No  Homicidal Thoughts:  No  Memory:  Recent;   Good Remote;   Good  Judgement:  Good  Insight:  Good  Psychomotor Activity:  Normal  Concentration:  Concentration: Good and Attention Span: Good  Recall:  Good  Fund of Knowledge: Good  Language: Good  Akathisia:  No  Handed:  Right  AIMS (if indicated): not done  Assets:  Communication Skills Desire for Improvement Financial Resources/Insurance Housing Social Support  ADL's:  Intact  Cognition: WNL  Sleep:  Good   Screenings: GAD-7     Office Visit from 03/24/2020 in [facility] Office Visit from 01/06/2020 in [facility]  Total GAD-7 Score 0 15    PHQ2-9     Office Visit from 03/24/2020 in [facility] Office Visit from 01/06/2020 in [facility] Office Visit from 11/06/2019 in [facility] Office Visit from 07/18/2019 in [facility] Office Visit from 11/30/2018 in [facility]  PHQ-2 Total Score 0 1 0 0 0  PHQ-9 Total Score 0 1 0 0 --       Assessment and Plan: Patient is recovering from recent  pneumonia infection and appears to be gradually improving.  She denies any significant issues pertaining to her psychiatry condition.  She would like to try getting off of the buspirone to reduce the number of medications she is  on.  1. MDD (major depressive disorder), recurrent, in full remission ([place])  - lamoTRIgine (LAMICTAL) 150 MG tablet; Take 1 tablet (150 mg total) by mouth daily.  Dispense: 90 tablet; Refill: 1 - PRISTIQ 100 MG 24 hr tablet; Take 1 tablet (100 mg total) by mouth daily.  Dispense: 90 tablet; Refill: 1 - topiramate (TOPAMAX) 100 MG tablet; Take 2 tablets (200 mg total) by mouth daily.  Dispense: 180 tablet; Refill: 1 - traZODone (DESYREL) 100 MG tablet; Take 1 tablet (100 mg total) by mouth at bedtime.  Dispense: 90 tablet; Refill: 1  2. Anxiety state  - Discontinue busPIRone (BUSPAR) 10 MG tablet; Take 1 tablet (10 mg total) by mouth at bedtime.  Dispense: 90 tablet; Refill: 1 - propranolol (INDERAL) 20 MG tablet; Take 1 tablet (20 mg total) by mouth 3 (three) times daily. For severe anxiety attacks  Dispense: 90 tablet; Refill: 1  3. Binge eating disorder  -  topiramate (TOPAMAX) 100 MG tablet; Take 2 tablets (200 mg total) by mouth daily.  Dispense: 180 tablet; Refill: 1  Continue same medication regimen, except discontinue Buspar. Follow up in 3 months.    [place] Crane, MD 03/31/2020, 8:35 AM

## 2020-04-03 ENCOUNTER — Other Ambulatory Visit: Payer: Self-pay

## 2020-04-03 DIAGNOSIS — K7689 Other specified diseases of liver: Secondary | ICD-10-CM

## 2020-04-06 ENCOUNTER — Other Ambulatory Visit: Payer: 59

## 2020-04-07 LAB — HEPATIC FUNCTION PANEL
ALT: 30 IU/L (ref 0–32)
AST: 23 IU/L (ref 0–40)
Albumin: 3.8 g/dL (ref 3.8–4.9)
Alkaline Phosphatase: 184 IU/L — ABNORMAL HIGH (ref 48–121)
Bilirubin Total: 0.3 mg/dL (ref 0.0–1.2)
Bilirubin, Direct: 0.1 mg/dL (ref 0.00–0.40)
Total Protein: 5.8 g/dL — ABNORMAL LOW (ref 6.0–8.5)

## 2020-04-14 ENCOUNTER — Other Ambulatory Visit: Payer: Self-pay | Admitting: Internal Medicine

## 2020-04-14 ENCOUNTER — Telehealth: Payer: Self-pay | Admitting: Internal Medicine

## 2020-04-14 DIAGNOSIS — R748 Abnormal levels of other serum enzymes: Secondary | ICD-10-CM

## 2020-04-14 NOTE — Telephone Encounter (Signed)
Called and left pt VM informing Dr B ordered the [place] to further evaluate the liver just to double check.  CM

## 2020-04-14 NOTE — Telephone Encounter (Signed)
Copied from [place] [id number]. Topic: General - Call Back - No Documentation >> Apr 14, 2020  2:08 PM Erick Blinks wrote: Reason for CRM: Pt has questions regarding ultrasound scheduled for 04/21/2020. Please advise  Best contact: [phone]

## 2020-04-21 ENCOUNTER — Ambulatory Visit
Admission: RE | Admit: 2020-04-21 | Discharge: 2020-04-21 | Disposition: A | Discharge status: Home / Self Care | Payer: 59 | Source: Ambulatory Visit | Attending: Internal Medicine | Admitting: Internal Medicine

## 2020-04-21 ENCOUNTER — Other Ambulatory Visit: Payer: Self-pay

## 2020-04-21 DIAGNOSIS — R748 Abnormal levels of other serum enzymes: Secondary | ICD-10-CM | POA: Diagnosis not present

## 2020-04-29 ENCOUNTER — Other Ambulatory Visit: Payer: Self-pay

## 2020-04-29 ENCOUNTER — Ambulatory Visit: Payer: 59 | Admitting: Internal Medicine

## 2020-04-29 ENCOUNTER — Encounter: Payer: Self-pay | Admitting: Internal Medicine

## 2020-04-29 VITALS — BP 140/88 | HR 71 | Temp 98.1°F | Ht 70.0 in | Wt 172.0 lb

## 2020-04-29 DIAGNOSIS — K644 Residual hemorrhoidal skin tags: Secondary | ICD-10-CM

## 2020-04-29 DIAGNOSIS — R7989 Other specified abnormal findings of blood chemistry: Secondary | ICD-10-CM | POA: Diagnosis not present

## 2020-04-29 DIAGNOSIS — I10 Essential (primary) hypertension: Secondary | ICD-10-CM

## 2020-04-29 DIAGNOSIS — J122 Parainfluenza virus pneumonia: Secondary | ICD-10-CM

## 2020-04-29 NOTE — Patient Instructions (Signed)
Avapro 300mg  - take 1/2 tablet every day. Follow up in one month for BP

## 2020-04-29 NOTE — Progress Notes (Signed)
Date:  04/29/2020   Name:  Toni Green   DOB:  [birth year]   MRN:  [id number]   Chief Complaint: Pneumonia (follow up- exhaling is hard sometimes/still has no energy sometimes/still has cough )  In the ED, was febrile to 100.5, HR 86, BP 133/81, saturating 95% on room air. WBC of 9.1 (89% PMNs), Hgb 13.5, platelets 166, creatinine at baseline 0.7. AST/ALT newly increased to 224/299 (down to 176/271 on recheck after 4 hours). B bili 0.9, alkaline phosphatase 305 (recheck at 285 Her inflammatory markers were elevated, with CRP 3.96. HsTnT with 226 and a delta of 7, repeat of 233. Lactate 1.2. BNP 794. CT PE with "bilateral groundglass and consolidative opacities, predominantly left-sided, concerning for multifocal infection" CT A/P showed mild intrahepatic and extrahepatic biliary ductal dilatation that was more pronounced than in 2011-01-31 (could represent sequela of prior cholecystectomy). Cardiology consulted for her troponemia and recommended work-up for possible myocarditis, including cMRI. Started on moxifloxacin.   Cardiac MRI was performed with and without contrast on a 1.5 T MRI system to evaluate myocardial  morphology, function, and viability in a 59 y.o.female with PMH significant for HTN, gastric bypass surgery  who presents to ED today with complaints of cough and SOB, and elevated troponins. Parainfluenza 3 RNA was  detected by PCR.   1. The left ventricle is normal in size and wall thickness. Regional and global LV Systolic function are  normal. The LVEF is calculated at >70%.   2. The right ventricle is normal in size and wall thickness. Regional and global RV systolicfunction are  normal.    3. The atria are both mildly enlarged.   4. The aortic valve is trileaflet in morphology. There is no significant aortic valve stenosis or  regurgitation.   5. There is mild mitral valve leaflet thickening. There is trivial mitral regurgitation. There is  trivial tricuspid  regurgitation. There is trivial pulmonic regurgitation.   6. Delayed enhancement imaging demonstrates no evidence of myocardial infarction, scarring, or  infiltration.   7. There is no evidence of an intracardiac thrombus.   8. The pericardium is normal in thickness. There is a trivial pericardial effusion.    Admitted to [facility]  03/26/20 to 03/27/20. Primary Diagnosis: Found to have parainfluenza virus infection with troponin and LFT elevations. Cardiac MRI normal and LFTs down-trending. She will complete an antibiotic course with Moxifloxacin for possible bacterial superinfection.   Changes Made:  -Moxifloxacin 400 mg daily for 5 day treatment course  Anticipatory Guidance for Outpatient Provider:  -Monitor for closely for cardiac symptoms -Patient strongly encouraged to get covid-19 vaccine following recovery from acute illness  Recommended Follow-up Studies:  -Repeat LFTs in 1 week with PCP  Cough The problem has been gradually improving (since hospital discharge). The cough is non-productive (some days she does not cough at all.). Pertinent negatives include no chest pain, fever, headaches, shortness of breath or weight loss. Nothing (she has been able to be very active without SOB or dizziness) aggravates the symptoms.  Hypertension This is a chronic problem. Pertinent negatives include no chest pain, headaches, palpitations or shortness of breath. Treatments tried: cut avapro to tiw when she was having low readings; now readings are higher.    Lab Results  Component Value Date   CREATININE 0.84 01/06/2020   BUN 8 01/06/2020   NA 142 01/06/2020   K 4.4 01/06/2020   CL 109 (H) 01/06/2020   CO2 23 01/06/2020   Lab Results  Component Value Date   CHOL 221 (H) 01/06/2020   HDL 99 01/06/2020   LDLCALC 111 (H) 01/06/2020   TRIG 61 01/06/2020   CHOLHDL 2.2 01/06/2020   Lab Results  Component Value Date   TSH 1.030 01/06/2020   No results found for: HGBA1C Lab Results    Component Value Date   WBC 4.7 02/28/2020   HGB 13.9 02/28/2020   HCT 40.7 02/28/2020   MCV 94 02/28/2020   PLT 222 02/28/2020   Lab Results  Component Value Date   ALT 30 04/06/2020   AST 23 04/06/2020   ALKPHOS 184 (H) 04/06/2020   BILITOT 0.3 04/06/2020     Review of Systems  Constitutional: Negative for appetite change, fatigue, fever, unexpected weight change and weight loss.  HENT: Negative for tinnitus and trouble swallowing.   Eyes: Negative for visual disturbance.  Respiratory: Positive for cough. Negative for chest tightness and shortness of breath.   Cardiovascular: Negative for chest pain, palpitations and leg swelling.  Gastrointestinal: Positive for anal bleeding. Negative for abdominal pain, constipation and diarrhea.  Genitourinary: Negative for dysuria and hematuria.  Musculoskeletal: Negative for arthralgias.  Neurological: Negative for dizziness, tremors, numbness and headaches.  Psychiatric/Behavioral: Negative for dysphoric mood.    Patient Active Problem List   Diagnosis Date Noted  . MDD (major depressive disorder), recurrent, in full remission ([place]) 10/14/2019  . Anxiety state 10/14/2019  . Binge eating disorder 10/14/2019  . Restless leg syndrome 11/30/2018  . Bright red rectal bleeding 11/30/2018  . S/P gastric bypass 08/07/2017  . Special screening for malignant neoplasms, colon   . Benign neoplasm of ascending colon   . Polyp of sigmoid colon   . Menopause syndrome 07/26/2016  . 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 Essential (primary) hypertension 07/20/2015  . 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 disorder 07/20/2015  . 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 DDD (degenerative disc disease), cervical 03/17/2014  . Hyperoxaluria 08/15/2013  . Calculus of kidney 02/13/2013    Allergies  Allergen Reactions  . Losartan Palpitations     Could feel heartbeat (strongly) in neck  . Oxycodone-Acetaminophen Itching       . Penicillins Rash  . Tape Rash    Skin irritation after back surgery    Past Surgical History:  Procedure Laterality Date  . ABDOMINAL HYSTERECTOMY    . APPENDECTOMY    . BACK SURGERY  10/2005   lumbar  . COLONOSCOPY WITH PROPOFOL N/A 09/23/2016   Procedure: COLONOSCOPY WITH PROPOFOL;  Surgeon: Lucilla Lame, MD;  Location: [place];  Service: Endoscopy;  Laterality: N/A;  . GALLBLADDER SURGERY    . GASTRIC BYPASS  2008  . POLYPECTOMY  09/23/2016   Procedure: POLYPECTOMY;  Surgeon: Lucilla Lame, MD;  Location: [place];  Service: Endoscopy;;    Social History   Tobacco Use  . Smoking status: Never Smoker  . Smokeless tobacco: Never Used  Substance Use Topics  . Alcohol use: No    Alcohol/week: 0.0 standard drinks  . Drug use: No     Medication list has been reviewed and updated.  Current Meds  Medication Sig  . Ascorbic Acid (VITA-C PO) Take by mouth.  Marland Kitchen azelastine (ASTELIN) 0.1 % nasal spray Place 2 sprays into both nostrils 2 (two) times daily. Use in each nostril as directed  . busPIRone (BUSPAR) 10 MG tablet Take 1 tablet (10 mg total) by mouth  at bedtime. (Patient taking differently: Take 10 mg by mouth at bedtime. As needed)  . Cyanocobalamin (B-12 PO) Take by mouth.  . Ferrous Gluconate (IRON 27 PO) Take by mouth.  . fluticasone (FLONASE) 50 MCG/ACT nasal spray Place 2 sprays into both nostrils daily.  . furosemide (LASIX) 20 MG tablet TAKE 1 TABLET DAILY (Patient taking differently: 3 times/day as needed-between meals & bedtime. )  . irbesartan (AVAPRO) 300 MG tablet TAKE 1 TABLET DAILY (Patient taking differently: 3 (three) times a week. )  . lamoTRIgine (LAMICTAL) 150 MG tablet Take 1 tablet (150 mg total) by mouth daily.  Marland Kitchen levothyroxine (SYNTHROID) 75 MCG tablet TAKE 1 TABLET DAILY BEFORE BREAKFAST  . Magnesium 250 MG TABS Take by mouth.  Marland Kitchen PRISTIQ 100 MG 24  hr tablet Take 1 tablet (100 mg total) by mouth daily.  . propranolol (INDERAL) 20 MG tablet Take 1 tablet (20 mg total) by mouth 3 (three) times daily. For severe anxiety attacks PRN  . rOPINIRole (REQUIP) 0.25 MG tablet Take 5 tablets (1.25 mg total) by mouth at bedtime.  . tamsulosin (FLOMAX) 0.4 MG CAPS capsule Take 1 capsule (0.4 mg total) by mouth daily as needed. (Patient taking differently: Take 0.4 mg by mouth daily as needed. Kidney stones)  . topiramate (TOPAMAX) 100 MG tablet Take 2 tablets (200 mg total) by mouth daily.  . traMADol (ULTRAM) 50 MG tablet 1/2-1 po bid prn  . traZODone (DESYREL) 100 MG tablet Take 1 tablet (100 mg total) by mouth at bedtime.  Marland Kitchen VITAMIN D PO Take by mouth.    PHQ 2/9 Scores 04/29/2020 03/24/2020 01/06/2020 11/06/2019  PHQ - 2 Score 0 0 1 0  PHQ- 9 Score 0 0 1 0    GAD 7 : Generalized Anxiety Score 04/29/2020 03/24/2020 01/06/2020  Nervous, Anxious, on Edge 0 0 0  Control/stop worrying 0 0 3  Worry too much - different things 0 0 3  Trouble relaxing 0 0 3  Restless 0 0 3  Easily annoyed or irritable 0 0 3  Afraid - awful might happen 0 0 0  Total GAD 7 Score 0 0 15  Anxiety Difficulty Not difficult at all Not difficult at all Somewhat difficult    BP Readings from Last 3 Encounters:  04/29/20 140/88  03/24/20 124/79  02/20/20 (!) 158/85    Physical Exam Vitals and nursing note reviewed.  Constitutional:      General: She is not in acute distress.    Appearance: She is well-developed.  HENT:     Head: Normocephalic and atraumatic.  Cardiovascular:     Rate and Rhythm: Normal rate and regular rhythm.     Pulses: Normal pulses.  Pulmonary:     Effort: Pulmonary effort is normal. No respiratory distress.     Breath sounds: No wheezing or rhonchi.     Comments: Frequent dry cough triggered by speaking and the exam Musculoskeletal:     Cervical back: Normal range of motion.     Right lower leg: No edema.     Left lower leg: No edema.    Lymphadenopathy:     Cervical: No cervical adenopathy.  Skin:    General: Skin is warm and dry.     Capillary Refill: Capillary refill takes less than 2 seconds.     Findings: No rash.  Neurological:     General: No focal deficit present.     Mental Status: She is alert and oriented to person, place, and time.  Psychiatric:        Behavior: Behavior normal.        Thought Content: Thought content normal.     Wt Readings from Last 3 Encounters:  04/29/20 172 lb (78 kg)  02/20/20 173 lb 6 oz (78.6 kg)  01/06/20 170 lb (77.1 kg)    BP 140/88   Pulse 71   Temp 98.1 F (36.7 C) (Oral)   Ht 5\' 10"  (1.778 m)   Wt 172 lb (78 kg)   SpO2 100%   BMI 24.68 kg/m   Assessment and Plan: 1. Parainfluenza virus pneumonia Recovering well; minimal sx at this time expected to resolve over the next month  2. Elevated liver function tests Follow up labs had normalized No further testing is needed at this time  3. Essential (primary) hypertension BP not well controlled - likely due to coughing Change Avapro to 300 mg  1/2 tablet daily Follow up in one month  4. Anal skin tag This continues to bleeding intermittently Recommend Vaseline or Desitin to reduce irritation Will follow up next visit   Partially dictated using Editor, commissioning. Any errors are unintentional.  Halina Maidens, MD [facility]  04/29/2020   Partially dictated using Dragon software. Any errors are unintentional.  Halina Maidens, MD [facility]  04/29/2020

## 2020-05-25 ENCOUNTER — Other Ambulatory Visit: Payer: Self-pay | Admitting: Psychiatry

## 2020-05-25 DIAGNOSIS — F5081 Binge eating disorder: Secondary | ICD-10-CM

## 2020-05-25 DIAGNOSIS — F3342 Major depressive disorder, recurrent, in full remission: Secondary | ICD-10-CM

## 2020-06-12 ENCOUNTER — Ambulatory Visit: Payer: 59 | Admitting: Internal Medicine

## 2020-06-15 ENCOUNTER — Other Ambulatory Visit: Payer: Self-pay

## 2020-06-15 ENCOUNTER — Encounter: Payer: Self-pay | Admitting: Internal Medicine

## 2020-06-15 ENCOUNTER — Ambulatory Visit: Payer: 59 | Admitting: Internal Medicine

## 2020-06-15 VITALS — BP 132/86 | HR 64 | Temp 98.2°F | Ht 70.0 in | Wt 179.0 lb

## 2020-06-15 DIAGNOSIS — G2581 Restless legs syndrome: Secondary | ICD-10-CM

## 2020-06-15 DIAGNOSIS — K625 Hemorrhage of anus and rectum: Secondary | ICD-10-CM | POA: Diagnosis not present

## 2020-06-15 DIAGNOSIS — I10 Essential (primary) hypertension: Secondary | ICD-10-CM | POA: Diagnosis not present

## 2020-06-15 NOTE — Progress Notes (Signed)
Date:  06/15/2020   Name:  Toni Green   DOB:  Jul 02, 1961   MRN:  [id number]   Chief Complaint: Hypertension (follow up ) and Hemorrhoids (talk about it. doesnt want to go back to the person RF was placed for Dr. Marius Ditch)  Hypertension This is a chronic problem. The problem is controlled. Pertinent negatives include no chest pain, headaches, palpitations or shortness of breath. Past treatments include angiotensin blockers and diuretics. The current treatment provides significant improvement.   Rectal bleeding - now more frequent and is bright red and seems to be coming from the rectum but not inside.  She had banding of an internal hemorrhoid but the bleeding still continues.  Lab Results  Component Value Date   CREATININE 0.84 01/06/2020   BUN 8 01/06/2020   NA 142 01/06/2020   K 4.4 01/06/2020   CL 109 (H) 01/06/2020   CO2 23 01/06/2020   Lab Results  Component Value Date   CHOL 221 (H) 01/06/2020   HDL 99 01/06/2020   LDLCALC 111 (H) 01/06/2020   TRIG 61 01/06/2020   CHOLHDL 2.2 01/06/2020   Lab Results  Component Value Date   TSH 1.030 01/06/2020   No results found for: HGBA1C Lab Results  Component Value Date   WBC 4.7 02/28/2020   HGB 13.9 02/28/2020   HCT 40.7 02/28/2020   MCV 94 02/28/2020   PLT 222 02/28/2020   Lab Results  Component Value Date   ALT 30 04/06/2020   AST 23 04/06/2020   ALKPHOS 184 (H) 04/06/2020   BILITOT 0.3 04/06/2020     Review of Systems  Constitutional: Negative for appetite change, fatigue, fever and unexpected weight change.  HENT: Negative for trouble swallowing.   Eyes: Negative for visual disturbance.  Respiratory: Negative for cough, chest tightness and shortness of breath.   Cardiovascular: Negative for chest pain, palpitations and leg swelling.  Gastrointestinal: Positive for anal bleeding. Negative for abdominal pain.  Endocrine: Negative for polydipsia and polyuria.  Genitourinary: Negative for dysuria and  hematuria.  Musculoskeletal: Negative for arthralgias.  Neurological: Negative for tremors, numbness and headaches.  Psychiatric/Behavioral: Negative for dysphoric mood.    Patient Active Problem List   Diagnosis Date Noted  . MDD (major depressive disorder), recurrent, in full remission ([place]) 10/14/2019  . Anxiety state 10/14/2019  . Binge eating disorder 10/14/2019  . Restless leg syndrome 11/30/2018  . Bright red rectal bleeding 11/30/2018  . S/P gastric bypass 08/07/2017  . Special screening for malignant neoplasms, colon   . Benign neoplasm of ascending colon   . Polyp of sigmoid colon   . Menopause syndrome 07/26/2016  . 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 Essential (primary) hypertension 07/20/2015  . 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 disorder 07/20/2015  . 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 DDD (degenerative disc disease), cervical 03/17/2014  . Hyperoxaluria 08/15/2013  . Calculus of kidney 02/13/2013    Allergies  Allergen Reactions  . Losartan Palpitations    Could feel heartbeat (strongly) in neck  . Oxycodone-Acetaminophen Itching       . Penicillins Rash  . Tape Rash    Skin irritation after back surgery    Past Surgical History:  Procedure Laterality Date  . ABDOMINAL HYSTERECTOMY    . APPENDECTOMY    . BACK SURGERY  10/2005   lumbar  . COLONOSCOPY WITH PROPOFOL N/A 09/23/2016   Procedure: COLONOSCOPY  WITH PROPOFOL;  Surgeon: Lucilla Lame, MD;  Location: [place];  Service: Endoscopy;  Laterality: N/A;  . GALLBLADDER SURGERY    . GASTRIC BYPASS  2008  . POLYPECTOMY  09/23/2016   Procedure: POLYPECTOMY;  Surgeon: Lucilla Lame, MD;  Location: [place];  Service: Endoscopy;;    Social History   Tobacco Use  . Smoking status: Never Smoker  . Smokeless tobacco: Never Used  Substance Use Topics  . Alcohol use: No     Alcohol/week: 0.0 standard drinks  . Drug use: No     Medication list has been reviewed and updated.  Current Meds  Medication Sig  . Ascorbic Acid (VITA-C PO) Take by mouth.  . busPIRone (BUSPAR) 10 MG tablet Take 1 tablet (10 mg total) by mouth at bedtime. (Patient taking differently: Take 10 mg by mouth at bedtime. As needed)  . Cyanocobalamin (B-12 PO) Take by mouth.  . Ferrous Gluconate (IRON 27 PO) Take by mouth.  . fluticasone (FLONASE) 50 MCG/ACT nasal spray Place 2 sprays into both nostrils daily.  . furosemide (LASIX) 20 MG tablet TAKE 1 TABLET DAILY (Patient taking differently: 3 times/day as needed-between meals & bedtime. )  . irbesartan (AVAPRO) 300 MG tablet TAKE 1 TABLET DAILY (Patient taking differently: 3 (three) times a week. Half tablet once a day)  . lamoTRIgine (LAMICTAL) 150 MG tablet Take 1 tablet (150 mg total) by mouth daily.  Marland Kitchen levothyroxine (SYNTHROID) 75 MCG tablet TAKE 1 TABLET DAILY BEFORE BREAKFAST  . Magnesium 250 MG TABS Take by mouth.  Marland Kitchen PRISTIQ 100 MG 24 hr tablet Take 1 tablet by mouth once daily  . propranolol (INDERAL) 20 MG tablet Take 1 tablet (20 mg total) by mouth 3 (three) times daily. For severe anxiety attacks PRN  . rOPINIRole (REQUIP) 0.25 MG tablet Take 5 tablets (1.25 mg total) by mouth at bedtime.  . tamsulosin (FLOMAX) 0.4 MG CAPS capsule Take 1 capsule (0.4 mg total) by mouth daily as needed. (Patient taking differently: Take 0.4 mg by mouth daily as needed. Kidney stones)  . topiramate (TOPAMAX) 100 MG tablet Take 2 tablets (200 mg total) by mouth daily.  . traMADol (ULTRAM) 50 MG tablet 1/2-1 po bid prn  . traZODone (DESYREL) 100 MG tablet Take 1 tablet (100 mg total) by mouth at bedtime.  Marland Kitchen VITAMIN D PO Take by mouth.    PHQ 2/9 Scores 04/29/2020 03/24/2020 01/06/2020 11/06/2019  PHQ - 2 Score 0 0 1 0  PHQ- 9 Score 0 0 1 0    GAD 7 : Generalized Anxiety Score 04/29/2020 03/24/2020 01/06/2020  Nervous, Anxious, on Edge 0 0 0    Control/stop worrying 0 0 3  Worry too much - different things 0 0 3  Trouble relaxing 0 0 3  Restless 0 0 3  Easily annoyed or irritable 0 0 3  Afraid - awful might happen 0 0 0  Total GAD 7 Score 0 0 15  Anxiety Difficulty Not difficult at all Not difficult at all Somewhat difficult    BP Readings from Last 3 Encounters:  06/15/20 132/86  04/29/20 140/88  03/24/20 124/79    Physical Exam Vitals and nursing note reviewed.  Constitutional:      General: She is not in acute distress.    Appearance: Normal appearance. She is well-developed.  HENT:     Head: Normocephalic and atraumatic.  Cardiovascular:     Rate and Rhythm: Normal rate and regular rhythm.  Pulses: Normal pulses.     Heart sounds: No murmur heard.   Pulmonary:     Effort: Pulmonary effort is normal. No respiratory distress.     Breath sounds: No wheezing or rhonchi.  Genitourinary:    Comments: Rectal exam declined Musculoskeletal:        General: Normal range of motion.     Cervical back: Normal range of motion.     Right lower leg: Edema (trace edema) present.     Left lower leg: Edema (trace edema) present.  Lymphadenopathy:     Cervical: No cervical adenopathy.  Skin:    General: Skin is warm and dry.     Capillary Refill: Capillary refill takes less than 2 seconds.     Findings: No rash.  Neurological:     General: No focal deficit present.     Mental Status: She is alert and oriented to person, place, and time.  Psychiatric:        Behavior: Behavior normal.        Thought Content: Thought content normal.     Wt Readings from Last 3 Encounters:  06/15/20 179 lb (81.2 kg)  04/29/20 172 lb (78 kg)  02/20/20 173 lb 6 oz (78.6 kg)    BP 132/86   Pulse 64   Temp 98.2 F (36.8 C) (Oral)   Ht 5\' 10"  (1.778 m)   Wt 179 lb (81.2 kg)   SpO2 99%   BMI 25.68 kg/m   Assessment and Plan: 1. Essential (primary) hypertension Clinically stable exam with well controlled BP on avapro 150 mg  and prn lasix. Tolerating medications without side effects at this time. Pt to continue current regimen and low sodium diet; benefits of regular exercise as able discussed.  2. Bright red rectal bleeding Continue despite banding of an internal hemorrhoid Pt would like to consult another provider for evaluation and management - Ambulatory referral to General Surgery  3. Restless leg syndrome Doing well on Requip nightly   Partially dictated using Editor, commissioning. Any errors are unintentional.  Halina Maidens, MD [facility]  06/15/2020

## 2020-06-17 ENCOUNTER — Other Ambulatory Visit: Payer: Self-pay | Admitting: Internal Medicine

## 2020-06-17 NOTE — Telephone Encounter (Signed)
Approved per protocol. Requested Prescriptions  Pending Prescriptions Disp Refills   furosemide (LASIX) 20 MG tablet [Pharmacy Med Name: FUROSEMIDE TABS 20MG ] 90 tablet 3    Sig: TAKE 1 TABLET DAILY     Cardiovascular:  Diuretics - Loop Passed - 06/17/2020 12:12 PM      Passed - K in normal range and within 360 days    Potassium  Date Value Ref Range Status  01/06/2020 4.4 3.5 - 5.2 mmol/L Final  01/28/2013 3.6 3.5 - 5.1 mmol/L Final         Passed - Ca in normal range and within 360 days    Calcium  Date Value Ref Range Status  01/06/2020 9.5 8.7 - 10.2 mg/dL Final   Calcium, Total  Date Value Ref Range Status  01/28/2013 8.8 8.5 - 10.1 mg/dL Final         Passed - Na in normal range and within 360 days    Sodium  Date Value Ref Range Status  01/06/2020 142 134 - 144 mmol/L Final  01/28/2013 138 136 - 145 mmol/L Final         Passed - Cr in normal range and within 360 days    Creatinine  Date Value Ref Range Status  01/28/2013 0.71 0.60 - 1.30 mg/dL Final   Creatinine, Ser  Date Value Ref Range Status  01/06/2020 0.84 0.57 - 1.00 mg/dL Final         Passed - Last BP in normal range    BP Readings from Last 1 Encounters:  06/15/20 132/86         Passed - Valid encounter within last 6 months    Recent Outpatient Visits          2 days ago Essential (primary) hypertension   [facility] Glean Hess, MD   1 month ago Parainfluenza virus pneumonia   [facility] Glean Hess, MD   2 months ago Acute non-recurrent maxillary sinusitis   [facility] Glean Hess, MD   5 months ago Annual physical exam   [facility] Glean Hess, MD   7 months ago Flu-like symptoms   [facility] Glean Hess, MD      Future Appointments            In 6 months [facility] Melia Jesse Sans, MD [facility], [facility]

## 2020-06-18 ENCOUNTER — Ambulatory Visit: Payer: Self-pay | Admitting: Surgery

## 2020-06-25 ENCOUNTER — Encounter: Payer: Self-pay | Admitting: General Surgery

## 2020-06-25 ENCOUNTER — Ambulatory Visit: Payer: Self-pay | Admitting: Surgery

## 2020-06-25 ENCOUNTER — Other Ambulatory Visit: Payer: Self-pay

## 2020-06-25 ENCOUNTER — Ambulatory Visit: Payer: 59 | Admitting: General Surgery

## 2020-06-25 VITALS — HR 53 | Temp 98.2°F | Resp 14 | Ht 70.0 in | Wt 179.0 lb

## 2020-06-25 DIAGNOSIS — K649 Unspecified hemorrhoids: Secondary | ICD-10-CM | POA: Diagnosis not present

## 2020-06-25 NOTE — Progress Notes (Addendum)
[name], female   DOB: 1961-09-13, 59 y.o.   MRN: [id number]  Chief Complaint  Patient presents with  . New Patient (Initial Visit)    rectal bleeding    HPI Toni Green is a 59 y.o. female.   She has been referred by her primary care provider, Dr. Army Melia, for further evaluation of rectal bleeding.  Ms. Kerins states that she has had hemorrhoids in the past, but they never really bothered her.  She previously had internal hemorrhoids banded by Dr. Marius Ditch.  Her current complaint relates to perirectal bleeding.  She denies any pain, burning, or itching.  She states that when she has a bowel movement, she does not notice any blood until she wipes, and then it is bright red.  She describes having to wear pads due to bleeding that results in soilage of her clothing.  She denies constipation.  She had a colonoscopy in 2017 that showed grade 1 internal hemorrhoids.  There was no discussion of external hemorrhoids in the procedure report.  She wonders if perhaps she has a tear that is causing her bleeding.  She is not interested in having further banding procedures.   Past Medical History:  Diagnosis Date  . Anxiety   . Arthritis    fingers  . 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 Bursitis    hips  . Bursitis, trochanteric 10/01/2014  . Cancer ([place])    skin ca  . Claustrophobia   . Complication of surgery 01/17/5955   Overview:  Dilated pouch found on EGD done 12/2014   . Depression   . Hypertension   . 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 Kidney stones   . Motion sickness    back seat cars  . Seizures ([place]) 1985   x1, after labor/childbirth  . Thyroid disease     Past Surgical History:  Procedure Laterality Date  . ABDOMINAL HYSTERECTOMY    . APPENDECTOMY    . BACK SURGERY  10/2005   lumbar  . COLONOSCOPY WITH PROPOFOL N/A 09/23/2016   Procedure: COLONOSCOPY WITH PROPOFOL;  Surgeon: Lucilla Lame, MD;  Location: [facility];   Service: Endoscopy;  Laterality: N/A;  . GALLBLADDER SURGERY    . GASTRIC BYPASS  2008  . POLYPECTOMY  09/23/2016   Procedure: POLYPECTOMY;  Surgeon: Lucilla Lame, MD;  Location: [facility];  Service: Endoscopy;;    Family History  Problem Relation Age of Onset  . Depression Mother   . Hypertension Mother   . Depression Father   . Hypertension Father   . Depression Sister   . Breast cancer Sister 31  . Hypertension Maternal Grandfather   . Depression Maternal Grandmother   . Hypertension Maternal Grandmother   . Hypertension Paternal Grandfather   . Hypertension Paternal Grandmother     Social History Social History   Tobacco Use  . Smoking status: Never Smoker  . Smokeless tobacco: Never Used  Substance Use Topics  . Alcohol use: No    Alcohol/week: 0.0 standard drinks  . Drug use: No    Allergies  Allergen Reactions  . Losartan Palpitations    Could feel heartbeat (strongly) in neck  . Penicillins Rash  . Tape Rash    Skin irritation after back surgery    Current Outpatient Medications  Medication Sig Dispense Refill  . busPIRone (BUSPAR) 10 MG tablet Take 1 tablet (10 mg total) by mouth at bedtime. (Patient taking differently: Take 10 mg  by mouth at bedtime. As needed) 90 tablet 1  . Cyanocobalamin (B-12 PO) Take by mouth.    . Ferrous Gluconate (IRON 27 PO) Take by mouth.    . fluticasone (FLONASE) 50 MCG/ACT nasal spray Place 2 sprays into both nostrils daily. 16 g 6  . furosemide (LASIX) 20 MG tablet TAKE 1 TABLET DAILY 90 tablet 3  . irbesartan (AVAPRO) 300 MG tablet TAKE 1 TABLET DAILY (Patient taking differently: 3 (three) times a week. Half tablet once a day) 90 tablet 3  . lamoTRIgine (LAMICTAL) 150 MG tablet Take 1 tablet (150 mg total) by mouth daily. 90 tablet 1  . levothyroxine (SYNTHROID) 75 MCG tablet TAKE 1 TABLET DAILY BEFORE BREAKFAST 90 tablet 3  . Magnesium 250 MG TABS Take by mouth.    Marland Kitchen PRISTIQ 100 MG 24 hr tablet Take 1 tablet by  mouth once daily 90 tablet 0  . propranolol (INDERAL) 20 MG tablet Take 1 tablet (20 mg total) by mouth 3 (three) times daily. For severe anxiety attacks PRN 270 tablet 1  . rOPINIRole (REQUIP) 0.25 MG tablet Take 5 tablets (1.25 mg total) by mouth at bedtime. 450 tablet 1  . tamsulosin (FLOMAX) 0.4 MG CAPS capsule Take 1 capsule (0.4 mg total) by mouth daily as needed. (Patient taking differently: Take 0.4 mg by mouth daily as needed. Kidney stones) 30 capsule 2  . topiramate (TOPAMAX) 100 MG tablet Take 2 tablets (200 mg total) by mouth daily. 180 tablet 1  . traMADol (ULTRAM) 50 MG tablet 1/2-1 po bid prn    . traZODone (DESYREL) 100 MG tablet Take 1 tablet (100 mg total) by mouth at bedtime. 90 tablet 1  . VITAMIN D PO Take by mouth.     No current facility-administered medications for this visit.    Review of Systems Review of Systems  Gastrointestinal: Positive for anal bleeding. Negative for constipation and rectal pain.  All other systems reviewed and are negative.   Pulse (!) 53, temperature 98.2 F (36.8 C), temperature source Oral, resp. rate 14, height 5\' 10"  (1.778 m), weight 179 lb (81.2 kg), SpO2 97 %. Body mass index is 25.68 kg/m.  Physical Exam Physical Exam Vitals reviewed. Exam conducted with a chaperone present.  Constitutional:      General: She is not in acute distress.    Appearance: Normal appearance.  HENT:     Head: Normocephalic and atraumatic.     Nose:     Comments: Covered with a mask    Mouth/Throat:     Comments: Covered with a mask Eyes:     General: No scleral icterus.       Right eye: No discharge.        Left eye: No discharge.  Neck:     Comments: The trachea is midline.  There is no palpable cervical or supraclavicular lymphadenopathy.  The thyroid gland is not palpably enlarged, nor are there any palpable thyroid masses.  The gland moves freely with deglutition. Cardiovascular:     Rate and Rhythm: Normal rate and regular rhythm.      Pulses: Normal pulses.  Pulmonary:     Effort: Pulmonary effort is normal.     Breath sounds: Normal breath sounds.  Abdominal:     General: Bowel sounds are normal. There is no distension.     Palpations: Abdomen is soft.  Genitourinary:      Comments: There is a fleshy soft tissue mass associated with the anal verge.  It  is nontender.  It is mobile.  It does not have the typical appearance of a prolapsed hemorrhoid.  There is no evidence of recent bleeding. Musculoskeletal:        General: No deformity or signs of injury.  Skin:    General: Skin is warm and dry.     Comments: Deeply tanned with significant photoaging.  Neurological:     General: No focal deficit present.     Mental Status: She is alert and oriented to person, place, and time.  Psychiatric:        Mood and Affect: Mood normal.        Behavior: Behavior normal.     Data Reviewed I reviewed both the clinic and procedure note from Dr. Marius Ditch, describing banding of internal hemorrhoids on Feb 20, 2020.  I viewed the procedure report from her colonoscopy performed September 23, 2016.  This describes nonbleeding internal hemorrhoids without comment on external hemorrhoids.  I also reviewed Dr. Gaspar Cola clinic notes from January 06, 2020, which resulted in her referral to gastroenterology.  I also reviewed the notes from June 15, 2020 which resulted in the referral to general surgery for further management.  Assessment This is a 59 year old woman who is experiencing painless bleeding from the anorectum.  Due to her lack of other symptoms, I assured her that she is highly unlikely to have a tear or fissure.  I did not appreciate 1 on my physical exam.  The lesion seen on exam does not have the characteristic appearance of a prolapsed hemorrhoid and may instead be a hypertrophied anal papilla.  The bleeding is significant enough that it compromises her quality of life and she is very interested in having surgery.  Plan I  have offered her an examination under anesthesia with planned resection of the soft tissue mass as well as any additional masses or hemorrhoids identified on her exam.  I cautioned her that hemorrhoid surgery is exquisitely painful for the majority of people but that we would do our best to minimize this.  The risks of surgery were discussed with her.  These include, but are not limited to, bleeding, infection, incontinence (temporary or permanent) of stool or feces, difficulty urinating postop, recurrence of hemorrhoids, pain, unanticipated findings on exam that might necessitate additional procedures.  She expressed understanding and had the opportunity to ask questions.  These were answered to her satisfaction.  We will work on getting her scheduled.    Fredirick Maudlin 06/25/2020, 5:23 PM

## 2020-06-25 NOTE — H&P (View-Only) (Signed)
[name], female   DOB: 04/06/1961, 59 y.o.   MRN: [id number]  Chief Complaint  Patient presents with   New Patient (Initial Visit)    rectal bleeding    HPI Toni Green is a 59 y.o. female.   She has been referred by her primary care provider, Dr. Army Melia, for further evaluation of rectal bleeding.  Toni Green states that she has had hemorrhoids in the past, but they never really bothered her.  She previously had internal hemorrhoids banded by Dr. Marius Ditch.  Her current complaint relates to perirectal bleeding.  She denies any pain, burning, or itching.  She states that when she has a bowel movement, she does not notice any blood until she wipes, and then it is bright red.  She describes having to wear pads due to bleeding that results in soilage of her clothing.  She denies constipation.  She had a colonoscopy in 2017 that showed grade 1 internal hemorrhoids.  There was no discussion of external hemorrhoids in the procedure report.  She wonders if perhaps she has a tear that is causing her bleeding.  She is not interested in having further banding procedures.   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   Kidney stones    Motion sickness    back seat cars   Seizures ([place]) 1985   x1, after labor/childbirth   Thyroid disease     Past Surgical History:  Procedure Laterality Date   ABDOMINAL HYSTERECTOMY     APPENDECTOMY     BACK SURGERY  10/2005   lumbar   COLONOSCOPY WITH PROPOFOL N/A 09/23/2016   Procedure: COLONOSCOPY WITH PROPOFOL;  Surgeon: Lucilla Lame, MD;  Location: [facility];   Service: Endoscopy;  Laterality: N/A;   GALLBLADDER SURGERY     GASTRIC BYPASS  2008   POLYPECTOMY  09/23/2016   Procedure: POLYPECTOMY;  Surgeon: Lucilla Lame, MD;  Location: [facility];  Service: Endoscopy;;    Family History  Problem Relation Age of Onset   Depression Mother    Hypertension Mother    Depression Father    Hypertension Father    Depression Sister    Breast cancer Sister 32   Hypertension Maternal Grandfather    Depression Maternal Grandmother    Hypertension Maternal Grandmother    Hypertension Paternal Grandfather    Hypertension Paternal Grandmother     Social History Social History   Tobacco Use   Smoking status: Never Smoker   Smokeless tobacco: Never Used  Substance Use Topics   Alcohol use: No    Alcohol/week: 0.0 standard drinks   Drug use: No    Allergies  Allergen Reactions   Losartan Palpitations    Could feel heartbeat (strongly) in neck   Penicillins Rash   Tape Rash    Skin irritation after back surgery    Current Outpatient Medications  Medication Sig Dispense Refill   busPIRone (BUSPAR) 10 MG tablet Take 1 tablet (10 mg total) by mouth at bedtime. (Patient taking differently: Take 10 mg  by mouth at bedtime. As needed) 90 tablet 1   Cyanocobalamin (B-12 PO) Take by mouth.     Ferrous Gluconate (IRON 27 PO) Take by mouth.     fluticasone (FLONASE) 50 MCG/ACT nasal spray Place 2 sprays into both nostrils daily. 16 g 6   furosemide (LASIX) 20 MG tablet TAKE 1 TABLET DAILY 90 tablet 3   irbesartan (AVAPRO) 300 MG tablet TAKE 1 TABLET DAILY (Patient taking differently: 3 (three) times a week. Half tablet once a day) 90 tablet 3   lamoTRIgine (LAMICTAL) 150 MG tablet Take 1 tablet (150 mg total) by mouth daily. 90 tablet 1   levothyroxine (SYNTHROID) 75 MCG tablet TAKE 1 TABLET DAILY BEFORE BREAKFAST 90 tablet 3   Magnesium 250 MG TABS Take by mouth.     PRISTIQ 100 MG 24 hr tablet Take 1 tablet by  mouth once daily 90 tablet 0   propranolol (INDERAL) 20 MG tablet Take 1 tablet (20 mg total) by mouth 3 (three) times daily. For severe anxiety attacks PRN 270 tablet 1   rOPINIRole (REQUIP) 0.25 MG tablet Take 5 tablets (1.25 mg total) by mouth at bedtime. 450 tablet 1   tamsulosin (FLOMAX) 0.4 MG CAPS capsule Take 1 capsule (0.4 mg total) by mouth daily as needed. (Patient taking differently: Take 0.4 mg by mouth daily as needed. Kidney stones) 30 capsule 2   topiramate (TOPAMAX) 100 MG tablet Take 2 tablets (200 mg total) by mouth daily. 180 tablet 1   traMADol (ULTRAM) 50 MG tablet 1/2-1 po bid prn     traZODone (DESYREL) 100 MG tablet Take 1 tablet (100 mg total) by mouth at bedtime. 90 tablet 1   VITAMIN D PO Take by mouth.     No current facility-administered medications for this visit.    Review of Systems Review of Systems  Gastrointestinal: Positive for anal bleeding. Negative for constipation and rectal pain.  All other systems reviewed and are negative.   Pulse (!) 53, temperature 98.2 F (36.8 C), temperature source Oral, resp. rate 14, height 5\' 10"  (1.778 m), weight 179 lb (81.2 kg), SpO2 97 %. Body mass index is 25.68 kg/m.  Physical Exam Physical Exam Vitals reviewed. Exam conducted with a chaperone present.  Constitutional:      General: She is not in acute distress.    Appearance: Normal appearance.  HENT:     Head: Normocephalic and atraumatic.     Nose:     Comments: Covered with a mask    Mouth/Throat:     Comments: Covered with a mask Eyes:     General: No scleral icterus.       Right eye: No discharge.        Left eye: No discharge.  Neck:     Comments: The trachea is midline.  There is no palpable cervical or supraclavicular lymphadenopathy.  The thyroid gland is not palpably enlarged, nor are there any palpable thyroid masses.  The gland moves freely with deglutition. Cardiovascular:     Rate and Rhythm: Normal rate and regular rhythm.      Pulses: Normal pulses.  Pulmonary:     Effort: Pulmonary effort is normal.     Breath sounds: Normal breath sounds.  Abdominal:     General: Bowel sounds are normal. There is no distension.     Palpations: Abdomen is soft.  Genitourinary:      Comments: There is a fleshy soft tissue mass associated with the anal verge.  It  is nontender.  It is mobile.  It does not have the typical appearance of a prolapsed hemorrhoid.  There is no evidence of recent bleeding. Musculoskeletal:        General: No deformity or signs of injury.  Skin:    General: Skin is warm and dry.     Comments: Deeply tanned with significant photoaging.  Neurological:     General: No focal deficit present.     Mental Status: She is alert and oriented to person, place, and time.  Psychiatric:        Mood and Affect: Mood normal.        Behavior: Behavior normal.     Data Reviewed I reviewed both the clinic and procedure note from Dr. Marius Ditch, describing banding of internal hemorrhoids on Feb 20, 2020.  I viewed the procedure report from her colonoscopy performed September 23, 2016.  This describes nonbleeding internal hemorrhoids without comment on external hemorrhoids.  I also reviewed Dr. Gaspar Cola clinic notes from January 06, 2020, which resulted in her referral to gastroenterology.  I also reviewed the notes from June 15, 2020 which resulted in the referral to general surgery for further management.  Assessment This is a 59 year old woman who is experiencing painless bleeding from the anorectum.  Due to her lack of other symptoms, I assured her that she is highly unlikely to have a tear or fissure.  I did not appreciate 1 on my physical exam.  The lesion seen on exam does not have the characteristic appearance of a prolapsed hemorrhoid and may instead be a hypertrophied anal papilla.  The bleeding is significant enough that it compromises her quality of life and she is very interested in having surgery.  Plan I  have offered her an examination under anesthesia with planned resection of the soft tissue mass as well as any additional masses or hemorrhoids identified on her exam.  I cautioned her that hemorrhoid surgery is exquisitely painful for the majority of people but that we would do our best to minimize this.  The risks of surgery were discussed with her.  These include, but are not limited to, bleeding, infection, incontinence (temporary or permanent) of stool or feces, difficulty urinating postop, recurrence of hemorrhoids, pain, unanticipated findings on exam that might necessitate additional procedures.  She expressed understanding and had the opportunity to ask questions.  These were answered to her satisfaction.  We will work on getting her scheduled.    Fredirick Maudlin 06/25/2020, 5:23 PM

## 2020-06-25 NOTE — Patient Instructions (Addendum)
You have requested to have Rectal surgery. Please review the information below and your [facility] Information. Our surgery scheduler will call you to look at surgery dates and go over information.   You will be required to do 2 enemas prior to your surgery. The first will be the night prior and the second will be done the morning of surgery.  Constipation is going to be your biggest obstacle following surgery. Use all stool softeners and laxatives as prescribed after your surgery and be sure to drink 72 ounces of water or more every day. This will help to avoid constipation. If you do all of this and you are still having difficulty, please call our office for further instructions as soon as you begin to have difficulty with bowel movements.  You may want to buy a disposable Sitz bath prior to surgery to aid in pain and cleanliness after surgery. The information on how to do use a disposable sitz bath or using a bath tub are below.   Surgery After Care Refer to this sheet in the next few weeks. These instructions provide you with information about caring for yourself after your procedure. Your health care provider may also give you more specific instructions. Your treatment has been planned according to current medical practices, but problems sometimes occur. Call your health care provider if you have any problems or questions after your procedure. What can I expect after the procedure? After the procedure, it is common to have:  Rectal pain.  Pain when you are having a bowel movement.  Slight rectal bleeding.  Follow these instructions at home: Medicines  Take over-the-counter and prescription medicines only as told by your health care provider.  Do not drive or operate heavy machinery while taking prescription pain medicine.  Use a stool softener or a bulk laxative as told by your health care provider. Activity  Rest at home. Return to your normal activities as told by your health  care provider.  Do not lift anything that is heavier than 10 lb (4.5 kg).  Do not sit for long periods of time. Take a walk every day or as told by your health care provider.  Do not strain to have a bowel movement. Do not spend a long time sitting on the toilet. Eating and drinking  Eat foods that contain fiber, such as whole grains, beans, nuts, fruits, and vegetables.  Drink enough fluid to keep your urine clear or pale yellow. General instructions  Sit in a warm bath 2-3 times per day to relieve soreness or itching.  Keep all follow-up visits as told by your health care provider. This is important. Contact a health care provider if:  Your pain medicine is not helping.  You have a fever or chills.  You become constipated.  You have trouble passing urine. Get help right away if:  You have very bad rectal pain.  You have heavy bleeding from your rectum. This information is not intended to replace advice given to you by your health care provider. Make sure you discuss any questions you have with your health care provider. Document Released: 12/24/2003 Document Revised: 03/10/2016 Document Reviewed: 12/29/2014 Elsevier Interactive Patient Education  2018 [employer] A disposable sitz bath is a plastic basin that fits over the toilet. A bag is hung above the toilet, and the bag is connected to a tube that opens into the basin. The bag is filled with warm water that flows into the basin  through the tube. A sitz bath can be used to help relieve symptoms, clean, and promote healing in the genital and anal areas, as well as in the lower abdomen and buttocks. What are the risks? Sitz baths are generally very safe. It is possible for the skin between the genitals and the anus (perineum) to become infected, but this is rare. You can avoid this by cleaning your sitz bath supplies thoroughly. How to use a disposable sitz bath 1. Close the clamp on the tube.  Make sure the clamp is closed tightly to prevent leakage. 2. Fill the sitz bath basin and the plastic bag with warm water. The water should be warm enough to be comfortable, but not hot. 3. Raise the toilet seat and place the filled basin on the toilet. Make sure the overflow opening is facing toward the back of the toilet. ? If you prefer, you may place the empty basin on the toilet first, and then use the plastic bag to fill the basin with warm water. 4. Hang the filled plastic bag overhead on a hook or towel rack close to the toilet. The bag should be higher than the toilet so that the water will flow down through the tube. 5. Attach the tube to the opening on the basin. Make sure that the tube is attached to the basin tightly to prevent leakage. 6. Sit on the basin and release the clamp. This will allow warm water to flow into the basin and flush the area around your genitals and anus. 7. Remain sitting on the basin for about 15-20 minutes, or as long as told by your health care provider. 8. Stand up and gently pat your skin dry. If directed, apply clean bandages (dressings) to the affected area as told by your health care provider. 9. Carefully remove the basin from the toilet seat and tip the basin into the toilet to empty any remaining water. Empty any remaining water from the plastic bag into the toilet. Then, flush the toilet. 10. Wash the basin with warm water and soap. Let the basin air dry in the sink. You should also let the plastic bag and the tubing air dry. 11. Store the basin, tubing, and plastic bag in a clean, dry area. 12. Wash your hands with soap and water. If soap and water are not available, use hand sanitizer. Contact a health care provider if:  You have symptoms that get worse instead of better.  You develop new skin irritation, redness, or swelling around your genitals or anus. This information is not intended to replace advice given to you by your health care provider.  Make sure you discuss any questions you have with your health care provider. Document Released: 04/03/2012 Document Revised: 03/10/2016 Document Reviewed: 08/23/2015 Elsevier Interactive Patient Education  2018 [employer].   How to Take a [employer] A sitz bath is a warm water bath that is taken while you are sitting down. The water should only come up to your hips and should cover your buttocks. Your health care provider may recommend a sitz bath to help you:  Clean the lower part of your body, including your genital area.  With itching.  With pain.  With sore muscles or muscles that tighten or spasm.  How to take a sitz bath Take 3-4 sitz baths per day or as told by your health care provider. 1. Partially fill a bathtub with warm water. You will only need the water to be deep enough to  cover your hips and buttocks when you are sitting in it. 2. If your health care provider told you to put medicine in the water, follow the directions exactly. 3. Sit in the water and open the tub drain a little. 4. Turn on the warm water again to keep the tub at the correct level. Keep the water running constantly. 5. Soak in the water for 15-20 minutes or as told by your health care provider. 6. After the sitz bath, pat the affected area dry first. Do not rub it. 7. Be careful when you stand up after the sitz bath because you may feel dizzy.  Contact a health care provider if:  Your symptoms get worse. Do not continue with sitz baths if your symptoms get worse.  You have new symptoms. Do not continue with sitz baths until you talk with your health care provider. This information is not intended to replace advice given to you by your health care provider. Make sure you discuss any questions you have with your health care provider. Document Released: 06/25/2004 Document Revised: 03/02/2016 Document Reviewed: 10/01/2014 Elsevier Interactive Patient Education  [employer].

## 2020-06-29 ENCOUNTER — Telehealth: Payer: Self-pay | Admitting: General Surgery

## 2020-06-29 ENCOUNTER — Other Ambulatory Visit: Payer: Self-pay | Admitting: Internal Medicine

## 2020-06-29 DIAGNOSIS — G2581 Restless legs syndrome: Secondary | ICD-10-CM

## 2020-06-29 NOTE — Telephone Encounter (Signed)
Requested Prescriptions  Pending Prescriptions Disp Refills  . rOPINIRole (REQUIP) 0.25 MG tablet [Pharmacy Med Name: ROPINIROLE HCL TABS 0.25MG ] 450 tablet 1    Sig: TAKE 5 TABLETS AT BEDTIME     Neurology:  Parkinsonian Agents Passed - 06/29/2020  7:09 AM      Passed - Last BP in normal range    BP Readings from Last 1 Encounters:  06/15/20 132/86         Passed - Valid encounter within last 12 months    Recent Outpatient Visits          2 weeks ago Essential (primary) hypertension   [facility] Glean Hess, MD   2 months ago Parainfluenza virus pneumonia   [facility] Glean Hess, MD   3 months ago Acute non-recurrent maxillary sinusitis   [facility] Glean Hess, MD   5 months ago Annual physical exam   [facility] Glean Hess, MD   7 months ago Flu-like symptoms   [facility] Glean Hess, MD      Future Appointments            In 6 months Army Melia Jesse Sans, MD [facility], [facility]

## 2020-06-29 NOTE — Telephone Encounter (Signed)
Patient has been advised of Pre-Admission date/time, COVID Testing date and Surgery date.  Surgery Date: 07/06/20 Preadmission Testing Date: 07/01/20 (phone 8a-1p) Covid Testing Date: 07/03/20 - patient advised to go to the [place] ([place]) between 8a-1p  Patient has been made aware to call [phone], between 1-3:00pm the day before surgery, to find out what time to arrive for surgery.

## 2020-06-30 ENCOUNTER — Encounter (HOSPITAL_COMMUNITY): Payer: Self-pay | Admitting: Psychiatry

## 2020-06-30 ENCOUNTER — Other Ambulatory Visit: Payer: Self-pay

## 2020-06-30 ENCOUNTER — Telehealth (INDEPENDENT_AMBULATORY_CARE_PROVIDER_SITE_OTHER): Payer: 59 | Admitting: Psychiatry

## 2020-06-30 DIAGNOSIS — F411 Generalized anxiety disorder: Secondary | ICD-10-CM | POA: Diagnosis not present

## 2020-06-30 DIAGNOSIS — F5081 Binge eating disorder: Secondary | ICD-10-CM | POA: Diagnosis not present

## 2020-06-30 DIAGNOSIS — F3342 Major depressive disorder, recurrent, in full remission: Secondary | ICD-10-CM

## 2020-06-30 MED ORDER — PRISTIQ 100 MG PO TB24: 100.0000 mg | ORAL_TABLET | Freq: Every day | ORAL | 1 refills | Status: DC

## 2020-06-30 MED ORDER — TOPIRAMATE 100 MG PO TABS: 200.0000 mg | ORAL_TABLET | Freq: Every day | ORAL | 1 refills | Status: DC

## 2020-06-30 MED ORDER — LAMOTRIGINE 150 MG PO TABS: 150.0000 mg | ORAL_TABLET | Freq: Every day | ORAL | 1 refills | Status: DC

## 2020-06-30 MED ORDER — TRAZODONE HCL 100 MG PO TABS: 100.0000 mg | ORAL_TABLET | Freq: Every day | ORAL | 1 refills | Status: DC

## 2020-06-30 NOTE — Progress Notes (Signed)
[place] MD OP Progress Note  Virtual Visit via Video Note  I connected with Toni Green on 06/30/20 at  8:20 AM EDT by a video enabled telemedicine application and verified that I am speaking with the correct person using two identifiers.  Location: Patient: Home Provider: Clinic   I discussed the limitations of evaluation and management by telemedicine and the availability of in person appointments. The patient expressed understanding and agreed to proceed.  I provided 18 minutes of non-face-to-face time during this encounter.    06/30/2020 8:36 AM Toni Green  MRN:  [id number]  Chief Complaint:  " I am doing much better."  HPI: Patient reported that she is feeling much better now.  She is fully recovered from pneumonia.  She stated that she is staying quite busy with her work requirements which she does not mind.  She stated that sometimes will get stressful but she is able to handle that fairly well.  She is sleeping well at night.  She sometimes takes a nap here and there during the daytime.  Overall she feels she is on the right track. Regarding Inderal use, patient stated that she still uses it 3 times a day for anxiety and sometimes has even used 4 times a day.  She stated that for now she would like to continue the same regimen without any further adjustments.   Visit Diagnosis:    ICD-10-CM   1. MDD (major depressive disorder), recurrent, in full remission ([place])  F33.42   2. Anxiety state  F41.1   3. Binge eating disorder  F50.81     Past Psychiatric History: MDD,anxiety, Binge eating d/o  Past Medical History:  Past Medical History:  Diagnosis Date  . Anxiety   . Arthritis    fingers  . 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 Bursitis    hips  . Bursitis, trochanteric 10/01/2014  . Cancer ([place])    skin ca  . Claustrophobia   . Complication of surgery 10/26/5091   Overview:  Dilated pouch found on EGD done 12/2014   . Depression   . Hypertension   .  Inflammation of sacroiliac joint ([place]) 08/22/2014  . Kidney stones   . Motion sickness    back seat cars  . Seizures ([place]) 1985   x1, after labor/childbirth  . Thyroid disease     Past Surgical History:  Procedure Laterality Date  . ABDOMINAL HYSTERECTOMY    . APPENDECTOMY    . BACK SURGERY  10/2005   lumbar  . COLONOSCOPY WITH PROPOFOL N/A 09/23/2016   Procedure: COLONOSCOPY WITH PROPOFOL;  Surgeon: Lucilla Lame, MD;  Location: [place];  Service: Endoscopy;  Laterality: N/A;  . GALLBLADDER SURGERY    . GASTRIC BYPASS  2008  . POLYPECTOMY  09/23/2016   Procedure: POLYPECTOMY;  Surgeon: Lucilla Lame, MD;  Location: [place];  Service: Endoscopy;;    Family Psychiatric History:see below  Family History:  Family History  Problem Relation Age of Onset  . Depression Mother   . Hypertension Mother   . Depression Father   . Hypertension Father   . Depression Sister   . Breast cancer Sister 24  . Hypertension Maternal Grandfather   . Depression Maternal Grandmother   . Hypertension Maternal Grandmother   . Hypertension Paternal Grandfather   . Hypertension Paternal Grandmother     Social History:  Social History   Socioeconomic History  . Marital status: Married    Spouse name: Not  on file  . Number of children: Not on file  . Years of education: Not on file  . Highest education level: Not on file  Occupational History  . Not on file  Tobacco Use  . Smoking status: Never Smoker  . Smokeless tobacco: Never Used  Substance and Sexual Activity  . Alcohol use: No    Alcohol/week: 0.0 standard drinks  . Drug use: No  . Sexual activity: Not Currently  Other Topics Concern  . Not on file  Social History Narrative  . Not on file   Social Determinants of Health   Financial Resource Strain:   . Difficulty of Paying Living Expenses: Not on file  Food Insecurity:   . Worried About Charity fundraiser in the Last Year: Not on file  . Ran Out of Food  in the Last Year: Not on file  Transportation Needs:   . Lack of Transportation (Medical): Not on file  . Lack of Transportation (Non-Medical): Not on file  Physical Activity:   . Days of Exercise per Week: Not on file  . Minutes of Exercise per Session: Not on file  Stress:   . Feeling of Stress : Not on file  Social Connections:   . Frequency of Communication with Friends and Family: Not on file  . Frequency of Social Gatherings with Friends and Family: Not on file  . Attends Religious Services: Not on file  . Active Member of Clubs or Organizations: Not on file  . Attends Archivist Meetings: Not on file  . Marital Status: Not on file    Allergies:  Allergies  Allergen Reactions  . Losartan Palpitations    Could feel heartbeat (strongly) in neck  . Penicillins Rash  . Tape Rash    Skin irritation after back surgery    Metabolic Disorder Labs: No results found for: HGBA1C, MPG No results found for: PROLACTIN Lab Results  Component Value Date   CHOL 221 (H) 01/06/2020   TRIG 61 01/06/2020   HDL 99 01/06/2020   CHOLHDL 2.2 01/06/2020   LDLCALC 111 (H) 01/06/2020   LDLCALC 120 (H) 11/30/2018   Lab Results  Component Value Date   TSH 1.030 01/06/2020   TSH 2.300 11/30/2018    Therapeutic Level Labs: No results found for: LITHIUM No results found for: VALPROATE No components found for:  CBMZ  Current Medications: Current Outpatient Medications  Medication Sig Dispense Refill  . Ascorbic Acid (VITAMIN C) 1000 MG tablet Take 1,000 mg by mouth at bedtime.    . busPIRone (BUSPAR) 10 MG tablet Take 1 tablet (10 mg total) by mouth at bedtime. (Patient taking differently: Take 10 mg by mouth at bedtime. As needed) 90 tablet 1  . cetirizine (ZYRTEC) 10 MG tablet Take 10 mg by mouth at bedtime.    . Cholecalciferol (VITAMIN D-3) 125 MCG (5000 UT) TABS Take 5,000 Units by mouth at bedtime.    . Cyanocobalamin (VITAMIN B-12) 2500 MCG SUBL Place 2,500 mcg under  the tongue at bedtime.    . ferrous sulfate 325 (65 FE) MG tablet Take 325 mg by mouth at bedtime.    . fluticasone (FLONASE) 50 MCG/ACT nasal spray Place 2 sprays into both nostrils daily. (Patient taking differently: Place 2 sprays into both nostrils daily as needed (sinus issues/allergies.). ) 16 g 6  . furosemide (LASIX) 20 MG tablet TAKE 1 TABLET DAILY (Patient taking differently: Take 20 mg by mouth daily as needed (fluid retention.). ) 90 tablet  3  . irbesartan (AVAPRO) 300 MG tablet TAKE 1 TABLET DAILY (Patient taking differently: Take 150 mg by mouth at bedtime. ) 90 tablet 3  . lamoTRIgine (LAMICTAL) 150 MG tablet Take 1 tablet (150 mg total) by mouth daily. 90 tablet 1  . levothyroxine (SYNTHROID) 75 MCG tablet TAKE 1 TABLET DAILY BEFORE BREAKFAST (Patient taking differently: Take 75 mcg by mouth at bedtime. ) 90 tablet 3  . magnesium oxide (MAG-OX) 400 MG tablet Take 400 mg by mouth at bedtime.    Marland Kitchen PRISTIQ 100 MG 24 hr tablet Take 1 tablet by mouth once daily (Patient taking differently: Take 100 mg by mouth daily. ) 90 tablet 0  . propranolol (INDERAL) 20 MG tablet Take 1 tablet (20 mg total) by mouth 3 (three) times daily. For severe anxiety attacks PRN (Patient taking differently: Take 20 mg by mouth 3 (three) times daily as needed (anxiety). ) 270 tablet 1  . rOPINIRole (REQUIP) 0.25 MG tablet TAKE 5 TABLETS AT BEDTIME (Patient taking differently: Take 1.25 mg by mouth at bedtime. ) 450 tablet 1  . tamsulosin (FLOMAX) 0.4 MG CAPS capsule Take 1 capsule (0.4 mg total) by mouth daily as needed. (Patient taking differently: Take 0.4 mg by mouth daily as needed (Kidney stones). ) 30 capsule 2  . topiramate (TOPAMAX) 100 MG tablet Take 2 tablets (200 mg total) by mouth daily. (Patient taking differently: Take 200 mg by mouth every evening. ) 180 tablet 1  . traMADol (ULTRAM) 50 MG tablet Take 25-50 mg by mouth 2 (two) times daily as needed (back pain.).     Marland Kitchen traZODone (DESYREL) 100 MG  tablet Take 1 tablet (100 mg total) by mouth at bedtime. 90 tablet 1   No current facility-administered medications for this visit.      Psychiatric Specialty Exam: Review of Systems  There were no vitals taken for this visit.There is no height or weight on file to calculate BMI.  General Appearance: Well Groomed  Eye Contact:  Good  Speech:  Clear and Coherent and Normal Rate  Volume:  Normal  Mood:  Euthymic  Affect:  Congruent  Thought Process:  Goal Directed, Linear and Descriptions of Associations: Intact  Orientation:  Full (Time, Place, and Person)  Thought Content: Logical   Suicidal Thoughts:  No  Homicidal Thoughts:  No  Memory:  Recent;   Good Remote;   Good  Judgement:  Good  Insight:  Good  Psychomotor Activity:  Normal  Concentration:  Concentration: Good and Attention Span: Good  Recall:  Good  Fund of Knowledge: Good  Language: Good  Akathisia:  No  Handed:  Right  AIMS (if indicated): not done  Assets:  Communication Skills Desire for Improvement Financial Resources/Insurance Housing Social Support  ADL's:  Intact  Cognition: WNL  Sleep:  Good   Screenings: GAD-7     Office Visit from 04/29/2020 in [facility] Office Visit from 03/24/2020 in [facility] Office Visit from 01/06/2020 in [facility]  Total GAD-7 Score 0 0 15    PHQ2-9     Office Visit from 04/29/2020 in [facility] Office Visit from 03/24/2020 in [facility] Office Visit from 01/06/2020 in [facility] Office Visit from 11/06/2019 in [facility] Office Visit from 07/18/2019 in [facility]  PHQ-2 Total Score 0 0 1 0 0  PHQ-9 Total Score 0 0 1 0 0       Assessment and Plan: Patient appears  to be stable on current regimen.  We will continue same regimen for now.  1. MDD (major depressive disorder), recurrent, in full remission ([place])  - lamoTRIgine (LAMICTAL) 150 MG tablet; Take 1 tablet (150 mg total) by mouth  daily.  Dispense: 90 tablet; Refill: 1 - PRISTIQ 100 MG 24 hr tablet; Take 1 tablet (100 mg total) by mouth daily.  Dispense: 90 tablet; Refill: 1 - topiramate (TOPAMAX) 100 MG tablet; Take 2 tablets (200 mg total) by mouth daily.  Dispense: 180 tablet; Refill: 1 - traZODone (DESYREL) 100 MG tablet; Take 1 tablet (100 mg total) by mouth at bedtime.  Dispense: 90 tablet; Refill: 1  2. Anxiety state   - propranolol (INDERAL) 20 MG tablet; Take 1 tablet (20 mg total) by mouth 3 (three) times daily. For severe anxiety attacks  Dispense: 90 tablet; Refill: 1  3. Binge eating disorder  -  topiramate (TOPAMAX) 100 MG tablet; Take 2 tablets (200 mg total) by mouth daily.  Dispense: 180 tablet; Refill: 1  Continue same medication regimen. Follow up in 3 months.    [place] Crane, MD 06/30/2020, 8:36 AM

## 2020-07-01 ENCOUNTER — Other Ambulatory Visit
Admission: RE | Admit: 2020-07-01 | Discharge: 2020-07-01 | Disposition: A | Discharge status: Home / Self Care | Payer: 59 | Source: Ambulatory Visit | Attending: General Surgery | Admitting: General Surgery

## 2020-07-01 ENCOUNTER — Other Ambulatory Visit: Payer: Self-pay

## 2020-07-01 HISTORY — DX: Personal history of urinary calculi: Z87.442

## 2020-07-01 HISTORY — DX: Other specified postprocedural states: R11.2

## 2020-07-01 HISTORY — DX: Anemia, unspecified: D64.9

## 2020-07-01 HISTORY — DX: Hypoglycemia, unspecified: E16.2

## 2020-07-01 HISTORY — DX: Hypothyroidism, unspecified: E03.9

## 2020-07-01 HISTORY — DX: Other specified postprocedural states: Z98.890

## 2020-07-01 HISTORY — DX: Nausea with vomiting, unspecified: R11.2

## 2020-07-01 HISTORY — DX: Nausea with vomiting, unspecified: Z98.890

## 2020-07-01 NOTE — Pre-Procedure Instructions (Signed)
ECG 12-lead  Component 3 mo ago  Vent Rate (bpm)  88     PR Interval (msec)  140     QRS Interval (msec)  86     QT Interval (msec)  346     QTc (msec)  418     Resulting Agency [facility] RESULTS  Narrative Performed by [place] This result has an attachment that is not available.  Normal sinus rhythm  Normal ECG   When compared with ECG of 26-Mar-2020 10:12,  No significant change was found  I reviewed and concur with this report. Electronically signed CE:[email or web address], MD, Marcello Moores [phone]) on 03/26/2020 9:36:41 PM Specimen Collected: 03/26/20 3:01 PM Last Resulted: 03/26/20 3:01 PM  Received From: [place]  Result Received: 03/30/20 10:07 AM  Encounter Summary

## 2020-07-01 NOTE — Patient Instructions (Addendum)
Your procedure is scheduled on: 07-06-20 MONDAY Report to Same Day Surgery 2nd floor medical mall [facility] Entrance-take elevator on left to 2nd floor.  Check in with surgery information desk.) To find out your arrival time please call [phone] between 1PM - 3PM on 07-03-20 FRIDAY  Remember: Instructions that are not followed completely may result in serious medical risk, up to and including death, or upon the discretion of your surgeon and anesthesiologist your surgery may need to be rescheduled.    _x___ 1. Do not eat food after midnight the night before your procedure. NO GUM OR CANDY AFTER MIDNIGHT. You may drink clear liquids up to 2 hours before you are scheduled to arrive at the hospital for your procedure.  Do not drink clear liquids within 2 hours of your scheduled arrival to the hospital.  Clear liquids include  --Water or Apple juice without pulp  --Gatorade  --Black Coffee or Clear Tea (No milk, no creamers, do not add anything to the coffee or Tea-OK TO ADD SUGAR)      __x__ 2. No Alcohol for 24 hours before or after surgery.   __x__3. No Smoking or e-cigarettes for 24 prior to surgery.  Do not use any chewable tobacco products for at least 6 hour prior to surgery   ____  4. Bring all medications with you on the day of surgery if instructed.    __x__ 5. Notify your doctor if there is any change in your medical condition     (cold, fever, infections).    x___6. On the morning of surgery brush your teeth with toothpaste and water.  You may rinse your mouth with mouth wash if you wish.  Do not swallow any toothpaste or mouthwash.   Do not wear jewelry, make-up, hairpins, clips or nail polish.  Do not wear lotions, powders, or perfumes. You may wear deodorant.  Do not shave 48 hours prior to surgery. Men may shave face and neck.  Do not bring valuables to the hospital.    [facility] is not responsible for any belongings or valuables.               Contacts,  dentures or bridgework may not be worn into surgery.  Leave your suitcase in the car. After surgery it may be brought to your room.  For patients admitted to the hospital, discharge time is determined by your  treatment team.  _  Patients discharged the day of surgery will not be allowed to drive home.  You will need someone to drive you home and stay with you the night of your procedure.    Please read over the following fact sheets that you were given:   [facility] Preparing for Surgery   _x___ TAKE THE FOLLOWING MEDICATION THE MORNING OF SURGERY WITH A SMALL SIP OF WATER. These include:  1. LAMICTAL (LAMOTRIGINE)  2. PRISTIQ  3. YOU MAY TAKE PROPRANOLOL THE MORNING OF SURGERY IF NEEDED  4.  5.  6.  _X___Fleets enema as directed-DO FLEET ENEMA AT HOME Sunday NIGHT BEFORE BED AND DO ANOTHER ENEMA AT HOME THE MORNING OF SURGERY 1 HOUR PRIOR TO ARRIVAL TIME TO HOSPITAL   ____ Use CHG Soap or sage wipes as directed on instruction sheet   ____ Use inhalers on the day of surgery and bring to hospital day of surgery  ____ Stop Metformin and Janumet 2 days prior to surgery.    ____ Take 1/2 of usual insulin dose the night  before surgery and none on the morning surgery.   ____ Follow recommendations from Cardiologist, Pulmonologist or PCP regarding stopping Aspirin, Coumadin, Plavix ,Eliquis, Effient, or Pradaxa, and Pletal.  X____Stop Anti-inflammatories such as Advil, Aleve, Ibuprofen, Motrin, Naproxen, Naprosyn, Goodies powders or aspirin products. OK to take Tylenol OR ULTRAM (TRAMADOL) IF NEEDED   _x___ Stop supplements until after surgery-STOP VITAMIN C NOW-YOU MAY RESUME AFTER YOUR SURGERY   ____ Bring C-Pap to the hospital.

## 2020-07-03 ENCOUNTER — Other Ambulatory Visit
Admission: RE | Admit: 2020-07-03 | Discharge: 2020-07-03 | Disposition: A | Discharge status: Home / Self Care | Payer: 59 | Source: Ambulatory Visit | Attending: General Surgery | Admitting: General Surgery

## 2020-07-03 ENCOUNTER — Other Ambulatory Visit: Payer: Self-pay

## 2020-07-03 DIAGNOSIS — Z20822 Contact with and (suspected) exposure to covid-19: Secondary | ICD-10-CM | POA: Diagnosis not present

## 2020-07-03 DIAGNOSIS — Z01812 Encounter for preprocedural laboratory examination: Secondary | ICD-10-CM | POA: Diagnosis not present

## 2020-07-03 LAB — POTASSIUM: Potassium: 3.8 mmol/L (ref 3.5–5.1)

## 2020-07-04 LAB — SARS CORONAVIRUS 2 (TAT 6-24 HRS): SARS Coronavirus 2: NEGATIVE

## 2020-07-06 ENCOUNTER — Ambulatory Visit: Payer: 59 | Admitting: Anesthesiology

## 2020-07-06 ENCOUNTER — Encounter
Admission: RE | Disposition: A | Discharge status: Home / Self Care | Payer: Self-pay | Source: Home / Self Care | Attending: General Surgery

## 2020-07-06 ENCOUNTER — Ambulatory Visit
Admission: RE | Admit: 2020-07-06 | Discharge: 2020-07-06 | Disposition: A | Discharge status: Home / Self Care | Payer: 59 | Attending: General Surgery | Admitting: General Surgery

## 2020-07-06 ENCOUNTER — Other Ambulatory Visit: Payer: Self-pay

## 2020-07-06 ENCOUNTER — Encounter: Payer: Self-pay | Admitting: General Surgery

## 2020-07-06 DIAGNOSIS — F419 Anxiety disorder, unspecified: Secondary | ICD-10-CM | POA: Diagnosis not present

## 2020-07-06 DIAGNOSIS — K644 Residual hemorrhoidal skin tags: Secondary | ICD-10-CM | POA: Insufficient documentation

## 2020-07-06 DIAGNOSIS — K625 Hemorrhage of anus and rectum: Secondary | ICD-10-CM | POA: Insufficient documentation

## 2020-07-06 DIAGNOSIS — Z8249 Family history of ischemic heart disease and other diseases of the circulatory system: Secondary | ICD-10-CM | POA: Diagnosis not present

## 2020-07-06 DIAGNOSIS — M1990 Unspecified osteoarthritis, unspecified site: Secondary | ICD-10-CM | POA: Diagnosis not present

## 2020-07-06 DIAGNOSIS — F329 Major depressive disorder, single episode, unspecified: Secondary | ICD-10-CM | POA: Diagnosis not present

## 2020-07-06 DIAGNOSIS — Z79899 Other long term (current) drug therapy: Secondary | ICD-10-CM | POA: Insufficient documentation

## 2020-07-06 DIAGNOSIS — E039 Hypothyroidism, unspecified: Secondary | ICD-10-CM | POA: Insufficient documentation

## 2020-07-06 DIAGNOSIS — Z8719 Personal history of other diseases of the digestive system: Secondary | ICD-10-CM | POA: Diagnosis not present

## 2020-07-06 DIAGNOSIS — Z9049 Acquired absence of other specified parts of digestive tract: Secondary | ICD-10-CM | POA: Insufficient documentation

## 2020-07-06 DIAGNOSIS — K642 Third degree hemorrhoids: Secondary | ICD-10-CM | POA: Diagnosis not present

## 2020-07-06 DIAGNOSIS — Z9884 Bariatric surgery status: Secondary | ICD-10-CM | POA: Insufficient documentation

## 2020-07-06 DIAGNOSIS — D649 Anemia, unspecified: Secondary | ICD-10-CM | POA: Insufficient documentation

## 2020-07-06 DIAGNOSIS — I10 Essential (primary) hypertension: Secondary | ICD-10-CM | POA: Insufficient documentation

## 2020-07-06 DIAGNOSIS — K649 Unspecified hemorrhoids: Secondary | ICD-10-CM | POA: Diagnosis not present

## 2020-07-06 DIAGNOSIS — Z880 Allergy status to penicillin: Secondary | ICD-10-CM | POA: Insufficient documentation

## 2020-07-06 DIAGNOSIS — Z888 Allergy status to other drugs, medicaments and biological substances status: Secondary | ICD-10-CM | POA: Insufficient documentation

## 2020-07-06 HISTORY — PX: EVALUATION UNDER ANESTHESIA WITH HEMORRHOIDECTOMY: SHX5624

## 2020-07-06 SURGERY — EXAM UNDER ANESTHESIA WITH HEMORRHOIDECTOMY
Anesthesia: General

## 2020-07-06 MED ORDER — DEXAMETHASONE SODIUM PHOSPHATE 10 MG/ML IJ SOLN
INTRAMUSCULAR | Status: DC | PRN
  Administered 2020-07-06: 10 mg via INTRAVENOUS

## 2020-07-06 MED ORDER — GABAPENTIN 300 MG PO CAPS
ORAL_CAPSULE | ORAL | Status: AC
  Administered 2020-07-06: 300 mg via ORAL
  Filled 2020-07-06: qty 1

## 2020-07-06 MED ORDER — MIDAZOLAM HCL 2 MG/2ML IJ SOLN
INTRAMUSCULAR | Status: AC
  Filled 2020-07-06: qty 2

## 2020-07-06 MED ORDER — ONDANSETRON HCL 4 MG/2ML IJ SOLN: 4.0000 mg | Freq: Once | INTRAMUSCULAR | Status: DC | PRN

## 2020-07-06 MED ORDER — LIDOCAINE HCL (CARDIAC) PF 100 MG/5ML IV SOSY
PREFILLED_SYRINGE | INTRAVENOUS | Status: DC | PRN
  Administered 2020-07-06: 100 mg via INTRAVENOUS

## 2020-07-06 MED ORDER — SEVOFLURANE IN SOLN
RESPIRATORY_TRACT | Status: AC
  Filled 2020-07-06: qty 250

## 2020-07-06 MED ORDER — BUPIVACAINE LIPOSOME 1.3 % IJ SUSP
INTRAMUSCULAR | Status: DC | PRN
  Administered 2020-07-06: 20 mL

## 2020-07-06 MED ORDER — FENTANYL CITRATE (PF) 100 MCG/2ML IJ SOLN
INTRAMUSCULAR | Status: AC
  Filled 2020-07-06: qty 2

## 2020-07-06 MED ORDER — FAMOTIDINE 20 MG PO TABS
ORAL_TABLET | ORAL | Status: AC
  Administered 2020-07-06: 20 mg via ORAL
  Filled 2020-07-06: qty 1

## 2020-07-06 MED ORDER — PROPOFOL 10 MG/ML IV BOLUS
INTRAVENOUS | Status: DC | PRN
  Administered 2020-07-06: 140 mg via INTRAVENOUS

## 2020-07-06 MED ORDER — BUPIVACAINE HCL (PF) 0.5 % IJ SOLN
INTRAMUSCULAR | Status: AC
  Filled 2020-07-06: qty 30

## 2020-07-06 MED ORDER — ROCURONIUM BROMIDE 10 MG/ML (PF) SYRINGE
PREFILLED_SYRINGE | INTRAVENOUS | Status: AC
  Filled 2020-07-06: qty 10

## 2020-07-06 MED ORDER — SUGAMMADEX SODIUM 500 MG/5ML IV SOLN
INTRAVENOUS | Status: AC
  Filled 2020-07-06: qty 5

## 2020-07-06 MED ORDER — BUPIVACAINE LIPOSOME 1.3 % IJ SUSP
INTRAMUSCULAR | Status: AC
  Filled 2020-07-06: qty 20

## 2020-07-06 MED ORDER — CHLORHEXIDINE GLUCONATE 0.12 % MT SOLN: 15.0000 mL | Freq: Once | OROMUCOSAL | Status: AC

## 2020-07-06 MED ORDER — LACTATED RINGERS IV SOLN: INTRAVENOUS | Status: DC

## 2020-07-06 MED ORDER — DEXAMETHASONE SODIUM PHOSPHATE 10 MG/ML IJ SOLN
INTRAMUSCULAR | Status: AC
  Filled 2020-07-06: qty 1

## 2020-07-06 MED ORDER — ORAL CARE MOUTH RINSE: 15.0000 mL | Freq: Once | OROMUCOSAL | Status: AC

## 2020-07-06 MED ORDER — ONDANSETRON HCL 4 MG/2ML IJ SOLN
INTRAMUSCULAR | Status: DC | PRN
  Administered 2020-07-06: 4 mg via INTRAVENOUS

## 2020-07-06 MED ORDER — FLEET ENEMA 7-19 GM/118ML RE ENEM: 1.0000 | ENEMA | Freq: Once | RECTAL | Status: DC

## 2020-07-06 MED ORDER — ONDANSETRON HCL 4 MG/2ML IJ SOLN
INTRAMUSCULAR | Status: AC
  Filled 2020-07-06: qty 2

## 2020-07-06 MED ORDER — IBUPROFEN 800 MG PO TABS: 800.0000 mg | ORAL_TABLET | Freq: Three times a day (TID) | ORAL | 0 refills | Status: AC | PRN

## 2020-07-06 MED ORDER — SUGAMMADEX SODIUM 200 MG/2ML IV SOLN
INTRAVENOUS | Status: DC | PRN
  Administered 2020-07-06: 200 mg via INTRAVENOUS

## 2020-07-06 MED ORDER — SCOPOLAMINE 1 MG/3DAYS TD PT72
MEDICATED_PATCH | TRANSDERMAL | Status: AC
  Administered 2020-07-06: 1.5 mg via TRANSDERMAL
  Filled 2020-07-06: qty 1

## 2020-07-06 MED ORDER — CHLORHEXIDINE GLUCONATE 0.12 % MT SOLN
OROMUCOSAL | Status: AC
  Administered 2020-07-06: 15 mL via OROMUCOSAL
  Filled 2020-07-06: qty 15

## 2020-07-06 MED ORDER — CELECOXIB 200 MG PO CAPS: 200.0000 mg | ORAL_CAPSULE | ORAL | Status: AC

## 2020-07-06 MED ORDER — MIDAZOLAM HCL 2 MG/2ML IJ SOLN
INTRAMUSCULAR | Status: DC | PRN
  Administered 2020-07-06: 2 mg via INTRAVENOUS

## 2020-07-06 MED ORDER — CHLORHEXIDINE GLUCONATE CLOTH 2 % EX PADS: 6.0000 | MEDICATED_PAD | Freq: Once | CUTANEOUS | Status: DC

## 2020-07-06 MED ORDER — ACETAMINOPHEN 500 MG PO TABS: 1000.0000 mg | ORAL_TABLET | ORAL | Status: AC

## 2020-07-06 MED ORDER — FENTANYL CITRATE (PF) 100 MCG/2ML IJ SOLN: 25.0000 ug | INTRAMUSCULAR | Status: DC | PRN

## 2020-07-06 MED ORDER — ACETAMINOPHEN 500 MG PO TABS: 1000.0000 mg | ORAL_TABLET | Freq: Three times a day (TID) | ORAL | 0 refills | Status: AC

## 2020-07-06 MED ORDER — ROCURONIUM BROMIDE 100 MG/10ML IV SOLN
INTRAVENOUS | Status: DC | PRN
  Administered 2020-07-06: 35 mg via INTRAVENOUS

## 2020-07-06 MED ORDER — GABAPENTIN 300 MG PO CAPS: 300.0000 mg | ORAL_CAPSULE | ORAL | Status: AC

## 2020-07-06 MED ORDER — THROMBIN 5000 UNITS EX SOLR
CUTANEOUS | Status: AC
  Filled 2020-07-06: qty 5000

## 2020-07-06 MED ORDER — GELATIN ABSORBABLE 100 CM EX MISC
CUTANEOUS | Status: AC
  Filled 2020-07-06: qty 1

## 2020-07-06 MED ORDER — BUPIVACAINE HCL (PF) 0.5 % IJ SOLN
INTRAMUSCULAR | Status: DC | PRN
  Administered 2020-07-06: 3 mL

## 2020-07-06 MED ORDER — SCOPOLAMINE 1 MG/3DAYS TD PT72: 1.0000 | MEDICATED_PATCH | TRANSDERMAL | Status: DC

## 2020-07-06 MED ORDER — PROPOFOL 10 MG/ML IV BOLUS
INTRAVENOUS | Status: AC
  Filled 2020-07-06: qty 20

## 2020-07-06 MED ORDER — ACETAMINOPHEN 500 MG PO TABS
ORAL_TABLET | ORAL | Status: AC
  Administered 2020-07-06: 1000 mg via ORAL
  Filled 2020-07-06: qty 2

## 2020-07-06 MED ORDER — FAMOTIDINE 20 MG PO TABS
ORAL_TABLET | ORAL | Status: AC
  Filled 2020-07-06: qty 1

## 2020-07-06 MED ORDER — BUPIVACAINE LIPOSOME 1.3 % IJ SUSP: 20.0000 mL | Freq: Once | INTRAMUSCULAR | Status: DC

## 2020-07-06 MED ORDER — FENTANYL CITRATE (PF) 100 MCG/2ML IJ SOLN
INTRAMUSCULAR | Status: DC | PRN
Start: 2020-07-06 — End: 2020-07-06
  Administered 2020-07-06: 50 ug via INTRAVENOUS

## 2020-07-06 MED ORDER — FAMOTIDINE 20 MG PO TABS: 20.0000 mg | ORAL_TABLET | Freq: Once | ORAL | Status: AC

## 2020-07-06 MED ORDER — OXYCODONE HCL 5 MG PO TABS: 5.0000 mg | ORAL_TABLET | Freq: Four times a day (QID) | ORAL | 0 refills | Status: DC | PRN

## 2020-07-06 MED ORDER — CELECOXIB 200 MG PO CAPS
ORAL_CAPSULE | ORAL | Status: AC
  Administered 2020-07-06: 200 mg via ORAL
  Filled 2020-07-06: qty 1

## 2020-07-06 SURGICAL SUPPLY — 29 items
BLADE SURG 15 STRL LF DISP TIS (BLADE) ×1 IMPLANT
BLADE SURG 15 STRL SS (BLADE) ×2
BRIEF STRETCH MATERNITY 2XLG (MISCELLANEOUS) ×2 IMPLANT
CANISTER SUCT 1200ML W/VALVE (MISCELLANEOUS) ×2 IMPLANT
COVER WAND RF STERILE (DRAPES) ×2 IMPLANT
DRAPE LAPAROTOMY 77X122 PED (DRAPES) ×2 IMPLANT
DRSG GAUZE FLUFF 36X18 (GAUZE/BANDAGES/DRESSINGS) ×2 IMPLANT
ELECT CAUTERY BLADE TIP 2.5 (TIP) ×2
ELECT REM PT RETURN 9FT ADLT (ELECTROSURGICAL) ×2
ELECTRODE CAUTERY BLDE TIP 2.5 (TIP) ×1 IMPLANT
ELECTRODE REM PT RTRN 9FT ADLT (ELECTROSURGICAL) ×1 IMPLANT
GLOVE BIO SURGEON STRL SZ 6.5 (GLOVE) ×2 IMPLANT
GLOVE INDICATOR 7.0 STRL GRN (GLOVE) ×6 IMPLANT
GOWN STRL REUS W/ TWL LRG LVL3 (GOWN DISPOSABLE) ×2 IMPLANT
GOWN STRL REUS W/TWL LRG LVL3 (GOWN DISPOSABLE) ×4
KIT TURNOVER KIT A (KITS) ×2 IMPLANT
LABEL OR SOLS (LABEL) ×2 IMPLANT
NEEDLE HYPO 25X1 1.5 SAFETY (NEEDLE) ×2 IMPLANT
PACK BASIN MINOR (MISCELLANEOUS) ×2 IMPLANT
SHEARS HARMONIC 9CM CVD (BLADE) IMPLANT
SOL PREP PVP 2OZ (MISCELLANEOUS) ×2
SOLUTION PREP PVP 2OZ (MISCELLANEOUS) ×1 IMPLANT
SPONGE LAP 18X18 RF (DISPOSABLE) ×2 IMPLANT
SURGILUBE 2OZ TUBE FLIPTOP (MISCELLANEOUS) ×2 IMPLANT
SUT SILK 0 CT 1 30 (SUTURE) ×2 IMPLANT
SUT VIC AB 3-0 SH 27 (SUTURE)
SUT VIC AB 3-0 SH 27X BRD (SUTURE) IMPLANT
SYR 10ML LL (SYRINGE) ×2 IMPLANT
SYR BULB IRRIG 60ML STRL (SYRINGE) ×2 IMPLANT

## 2020-07-06 NOTE — Transfer of Care (Signed)
Immediate Anesthesia Transfer of Care Note  Patient: Toni Green  Procedure(s) Performed: Jasmine December UNDER ANESTHESIA WITH HEMORRHOIDECTOMY (N/A )  Patient Location: PACU  Anesthesia Type:General  Level of Consciousness: awake, alert  and oriented  Airway & Oxygen Therapy: Patient Spontanous Breathing and Patient connected to face mask oxygen  Post-op Assessment: Report given to RN and Post -op Vital signs reviewed and stable  Post vital signs: Reviewed and stable  Last Vitals:  Vitals Value Taken Time  BP 128/71 07/06/20 0839  Temp 35.8 C 07/06/20 0839  Pulse 60 07/06/20 0839  Resp 19 07/06/20 0841  SpO2 100 % 07/06/20 0839  Vitals shown include unvalidated device data.  Last Pain:  Vitals:   07/06/20 0839  TempSrc:   PainSc: 0-No pain      Patients Stated Pain Goal: 0 (79/72/82 0601)  Complications: No complications documented.

## 2020-07-06 NOTE — Progress Notes (Signed)
   07/06/20 0750  Clinical Encounter Type  Visited With Family  Visit Type Initial  Referral From Family  Consult/Referral To Chaplain  While rounding SDS waiting area, chaplain visited with pt's husband who said every day is a good day with God. Pt's husband said service at [facility]  Is astronomical. He said he has not complaints and this is a great place. He said he has had 15 surgery ans has always had a good experience and that is why his wife came to [facility]. Chaplain told pt's husband it is good to hear something good and that she would chart what said. Chaplain thanked him for his comments and wished him well.

## 2020-07-06 NOTE — Op Note (Signed)
Operative Note  Preoperative Diagnosis: Anal bleeding  Postoperative Diagnosis: Anterior column internal and external hemorrhoids  Operation: Examination under anesthesia (diagnostic anoscopy) and single column internal and external hemorrhoidectomy  Surgeon: Fredirick Maudlin, MD  Assistant: None  Anesthesia: General endotracheal  Findings: Nonbleeding grade 1 internal and external hemorrhoids circumferentially, with a single column grade 3 internal hemorrhoid in the anterior column.  No fissure or fistula appreciated on anoscopy.  Indications: This is a 59 year old woman who has been experiencing bleeding per rectum.  She has not experienced any pain, itching, or burning.  The bleeding is frequent enough that it is affecting her quality of life and she was interested in surgical intervention.  On examination in clinic, there was what appeared to be a somewhat polypoid lesion in the anterior anal wall, but there were no stigmata of bleeding and it was difficult to ascertain whether or not this was the source of the blood.  She was consented for diagnostic anoscopy with possible hemorrhoidectomy.  The risks of the procedure were discussed with her and she agreed to proceed.  Procedure In Detail: The patient was identified in the preoperative holding area and brought to the operating room where she was intubated on her stretcher and then turned into the prone position on the OR table.  Her body was supported with a purpose-made chest support.  Both arms were supported on arm boards, and care was taken to adequately pad all bony prominences appropriately.  Bilateral sequential compression devices were already in place.  She was then positioned appropriately for the operation and sterilely prepped and draped in standard fashion.  A timeout was performed confirming her identity, the procedure being performed, her allergies, all necessary equipment was available, and that maintenance anesthesia was  adequate.  Digital rectal exam was performed and no masses were palpated.  The Hill-Ferguson anoscope/retractor was inserted and the internal anus examined thoroughly.  She had circumferential grade 1 external hemorrhoids as well as a few grade 1 internal hemorrhoids.  There was no stigmata of bleeding from any of them.  In the anterior column, however, there was a grade 3 internal hemorrhoid.  This was selected for excision.  The skin and subcutaneous tissue surrounding the hemorrhoid were infiltrated with 0.5% bupivacaine.  The hemorrhoid was then grasped with an Allis clamp.  The skin was incised with electrocautery.  The harmonic scalpel was then used to dissect the hemorrhoidal tissue and associated mucosa off of the underlying musculature.  Care was taken to avoid violating the sphincter muscles.  The entire column was included in our excision.  It was transected and handed off as a specimen.  Hemostasis was obtained with electrocautery.  Liposomal bupivacaine was then generously infiltrated around the entire external anal opening, as well as along our resection bed.  The area was cleaned and gauze fluffs and mesh underpants used as a dressing.  The patient was turned supine on her stretcher, where she was awakened, extubated, and then taken to the postanesthesia care unit in good condition.  EBL: Less than 2 cc  IVF: Anesthesia record  Specimen(s): Anterior column internal and external hemorrhoids for permanent  Complications: none immediately apparent.   Counts: all needles, instruments, and sponges were counted and reported to be correct in number at the end of the case.   I was present for and participated in the entire operation.  Fredirick Maudlin 9:04 AM

## 2020-07-06 NOTE — Interval H&P Note (Signed)
History and Physical Interval Note:  07/06/2020 7:19 AM  Toni Green  has presented today for surgery, with the diagnosis of Bleeding hemorrhoid.  The various methods of treatment have been discussed with the patient and family. After consideration of risks, benefits and other options for treatment, the patient has consented to  Procedure(s): EXAM UNDER ANESTHESIA WITH HEMORRHOIDECTOMY (N/A) as a surgical intervention.  The patient's history has been reviewed, patient examined, no change in status, stable for surgery.  I have reviewed the patient's chart and labs.  Questions were answered to the patient's satisfaction.     Fredirick Maudlin

## 2020-07-06 NOTE — Anesthesia Postprocedure Evaluation (Signed)
Anesthesia Post Note  Patient: Toni Green  Procedure(s) Performed: EXAM UNDER ANESTHESIA WITH HEMORRHOIDECTOMY (N/A )  Patient location during evaluation: PACU Anesthesia Type: General Level of consciousness: awake and alert Pain management: pain level controlled Vital Signs Assessment: post-procedure vital signs reviewed and stable Respiratory status: spontaneous breathing and respiratory function stable Cardiovascular status: stable Anesthetic complications: no   No complications documented.   Last Vitals:  Vitals:   07/06/20 0909 07/06/20 0919  BP: 116/66 113/84  Pulse: (!) 59 (!) 54  Resp: 14 14  Temp:  36.6 C  SpO2: 99% 100%    Last Pain:  Vitals:   07/06/20 0919  TempSrc:   PainSc: 0-No pain                 Nimra Puccinelli K

## 2020-07-06 NOTE — Anesthesia Procedure Notes (Signed)
Procedure Name: Intubation Date/Time: 07/06/2020 7:45 AM Performed by: Allean Found, CRNA Pre-anesthesia Checklist: Patient identified, Patient being monitored, Timeout performed, Emergency Drugs available and Suction available Patient Re-evaluated:Patient Re-evaluated prior to induction Oxygen Delivery Method: Circle system utilized Preoxygenation: Pre-oxygenation with 100% oxygen Induction Type: IV induction Ventilation: Mask ventilation without difficulty Laryngoscope Size: McGraph and 4 Grade View: Grade I Tube type: Oral Tube size: 7.0 mm Number of attempts: 1 Airway Equipment and Method: Stylet Placement Confirmation: ETT inserted through vocal cords under direct vision,  positive ETCO2 and breath sounds checked- equal and bilateral Secured at: 21 cm Tube secured with: Tape Dental Injury: Teeth and Oropharynx as per pre-operative assessment

## 2020-07-06 NOTE — Anesthesia Preprocedure Evaluation (Addendum)
Anesthesia Evaluation  Patient identified by MRN, date of birth, ID band Patient awake    Reviewed: Allergy & Precautions, NPO status , Patient's Chart, lab work & pertinent test results  History of Anesthesia Complications (+) PONV and history of anesthetic complications  Airway Mallampati: II       Dental   Pulmonary neg sleep apnea, neg COPD, Not current smoker,           Cardiovascular hypertension, Pt. on medications (-) Past MI and (-) CHF (-) dysrhythmias (-) Valvular Problems/Murmurs     Neuro/Psych Seizures - (one episode with pregnancy, no meds),  Anxiety Depression    GI/Hepatic Neg liver ROS, neg GERD  ,  Endo/Other  neg diabetesHypothyroidism   Renal/GU Renal disease (stones)     Musculoskeletal   Abdominal   Peds  Hematology  (+) anemia ,   Anesthesia Other Findings   Reproductive/Obstetrics                             Anesthesia Physical Anesthesia Plan  ASA: II  Anesthesia Plan: General   Post-op Pain Management:    Induction: Intravenous  PONV Risk Score and Plan: 4 or greater and Ondansetron, Dexamethasone, Scopolamine patch - Pre-op and Midazolam  Airway Management Planned: Oral ETT  Additional Equipment:   Intra-op Plan:   Post-operative Plan:   Informed Consent: I have reviewed the patients History and Physical, chart, labs and discussed the procedure including the risks, benefits and alternatives for the proposed anesthesia with the patient or authorized representative who has indicated his/her understanding and acceptance.       Plan Discussed with:   Anesthesia Plan Comments:        Anesthesia Quick Evaluation

## 2020-07-06 NOTE — Discharge Instructions (Addendum)
Bupivacaine Liposomal Suspension for Injection °What is this medicine? °BUPIVACAINE LIPOSOMAL (bue PIV a kane LIP oh som al) is an anesthetic. It causes loss of feeling in the skin or other tissues. It is used to prevent and to treat pain from some procedures. °This medicine may be used for other purposes; ask your health care provider or pharmacist if you have questions. °COMMON BRAND NAME(S): EXPAREL °What should I tell my health care provider before I take this medicine? °They need to know if you have any of these conditions: °· G6PD deficiency °· heart disease °· kidney disease °· liver disease °· low blood pressure °· lung or breathing disease, like asthma °· an unusual or allergic reaction to bupivacaine, other medicines, foods, dyes, or preservatives °· pregnant or trying to get pregnant °· breast-feeding °How should I use this medicine? °This medicine is for injection into the affected area. It is given by a health care professional in a hospital or clinic setting. °Talk to your pediatrician regarding the use of this medicine in children. Special care may be needed. °Overdosage: If you think you have taken too much of this medicine contact a poison control center or emergency room at once. °NOTE: This medicine is only for you. Do not share this medicine with others. °What if I miss a dose? °This does not apply. °What may interact with this medicine? °This medicine may interact with the following medications: °· acetaminophen °· certain antibiotics like dapsone, nitrofurantoin, aminosalicylic acid, sulfonamides °· certain medicines for seizures like phenobarbital, phenytoin, valproic acid °· chloroquine °· cyclophosphamide °· flutamide °· hydroxyurea °· ifosfamide °· metoclopramide °· nitric oxide °· nitroglycerin °· nitroprusside °· nitrous oxide °· other local anesthetics like lidocaine, pramoxine, tetracaine °· primaquine °· quinine °· rasburicase °· sulfasalazine °This list may not describe all possible  interactions. Give your health care provider a list of all the medicines, herbs, non-prescription drugs, or dietary supplements you use. Also tell them if you smoke, drink alcohol, or use illegal drugs. Some items may interact with your medicine. °What should I watch for while using this medicine? °Your condition will be monitored carefully while you are receiving this medicine. °Be careful to avoid injury while the area is numb, and you are not aware of pain. °What side effects may I notice from receiving this medicine? °Side effects that you should report to your doctor or health care professional as soon as possible: °· allergic reactions like skin rash, itching or hives, swelling of the face, lips, or tongue °· seizures °· signs and symptoms of a dangerous change in heartbeat or heart rhythm like chest pain; dizziness; fast, irregular heartbeat; palpitations; feeling faint or lightheaded; falls; breathing problems °· signs and symptoms of methemoglobinemia such as pale, gray, or blue colored skin; headache; fast heartbeat; shortness of breath; feeling faint or lightheaded, falls; tiredness °Side effects that usually do not require medical attention (report to your doctor or health care professional if they continue or are bothersome): °· anxious °· back pain °· changes in taste °· changes in vision °· constipation °· dizziness °· fever °· nausea, vomiting °This list may not describe all possible side effects. Call your doctor for medical advice about side effects. You may report side effects to FDA at 1-800-FDA-1088. °Where should I keep my medicine? °This drug is given in a hospital or clinic and will not be stored at home. °NOTE: This sheet is a summary. It may not cover all possible information. If you have questions about this   medicine, talk to your doctor, pharmacist, or health care provider. °© 2020 [employer] (2019-07-16 10:48:23) ° ° °AMBULATORY SURGERY  °DISCHARGE INSTRUCTIONS ° ° °1) The  drugs that you were given will stay in your system until tomorrow so for the next 24 hours you should not: ° °A) Drive an automobile °B) Make any legal decisions °C) Drink any alcoholic beverage ° ° °2) You may resume regular meals tomorrow.  Today it is better to start with liquids and gradually work up to solid foods. ° °You may eat anything you prefer, but it is better to start with liquids, then soup and crackers, and gradually work up to solid foods. ° ° °3) Please notify your doctor immediately if you have any unusual bleeding, trouble breathing, redness and pain at the surgery site, drainage, fever, or pain not relieved by medication. ° ° ° °4) Additional Instructions: ° ° ° ° ° ° ° °Please contact your physician with any problems or Same Day Surgery at [phone], Monday through Friday 6 am to 4 pm, or [facility] number at [phone]. °

## 2020-07-07 LAB — SURGICAL PATHOLOGY

## 2020-07-09 ENCOUNTER — Other Ambulatory Visit: Payer: Self-pay | Admitting: General Surgery

## 2020-07-09 ENCOUNTER — Telehealth: Payer: Self-pay | Admitting: General Surgery

## 2020-07-09 MED ORDER — OXYCODONE HCL 5 MG PO TABS
5.0000 mg | ORAL_TABLET | Freq: Four times a day (QID) | ORAL | 0 refills | Status: DC | PRN
Start: 2020-07-09 — End: 2020-07-30

## 2020-07-09 NOTE — Telephone Encounter (Signed)
Refill sent in

## 2020-07-09 NOTE — Telephone Encounter (Signed)
Patient called stating that she took her last pain med today and is requesting for a refill or something comparable to what she had.  She also rescheduled her appointment as she wants to see Dr. Celine Ahr only.  Please call her.  [facility] [place].  Thank you.

## 2020-07-09 NOTE — Telephone Encounter (Signed)
The patient has been using Tylenol and Ibuprofen as prescribed also. She has been using a hand held shower sprayer several times a day with warm water. She has also been taking Miralax and a stool softener. She reports that she is still having a lot of pain but the Oxycodone makes it bearable. I let her know I would send a message to her surgeon about this request.

## 2020-07-14 ENCOUNTER — Encounter: Payer: Self-pay | Admitting: General Surgery

## 2020-07-22 ENCOUNTER — Encounter: Payer: 59 | Admitting: Physician Assistant

## 2020-07-30 ENCOUNTER — Ambulatory Visit (INDEPENDENT_AMBULATORY_CARE_PROVIDER_SITE_OTHER): Payer: 59 | Admitting: General Surgery

## 2020-07-30 ENCOUNTER — Other Ambulatory Visit: Payer: Self-pay | Admitting: General Surgery

## 2020-07-30 ENCOUNTER — Telehealth: Payer: Self-pay | Admitting: General Surgery

## 2020-07-30 ENCOUNTER — Other Ambulatory Visit: Payer: Self-pay

## 2020-07-30 ENCOUNTER — Encounter: Payer: Self-pay | Admitting: General Surgery

## 2020-07-30 VITALS — BP 151/108 | HR 67 | Temp 97.9°F | Ht 70.0 in | Wt 176.6 lb

## 2020-07-30 DIAGNOSIS — N823 Fistula of vagina to large intestine: Secondary | ICD-10-CM

## 2020-07-30 MED ORDER — OXYCODONE HCL 5 MG PO TABS
5.0000 mg | ORAL_TABLET | Freq: Four times a day (QID) | ORAL | 0 refills | Status: DC | PRN
Start: 2020-07-30 — End: 2020-12-07

## 2020-07-30 NOTE — Progress Notes (Signed)
Toni Green is here today for a postoperative visit.  She underwent a single column hemorrhoidectomy of the anterior column, both internal and external, on 06 July 2020.  Final pathology was consistent with:  HEMORRHOID, ANTERIOR COLUMN INTERNAL AND EXTERNAL; EXCISION:  - ANORECTAL MUCOSA WITH UNDERLYING DILATED VESSELS, CONSISTENT WITH  HEMORRHOID.  - NEGATIVE FOR MALIGNANCY.  Since her surgery, she has had some issues with incontinence of flatus, as well as a fair amount of yellowish mucus draining from the site.  Her pain has improved significantly, but she still has occasional episodes of a throbbing, stabbing sensation that will come on all of a sudden.  The last episode was yesterday evening.  She is also expressing concern about stool and flatus coming from her vagina.  Today's Vitals   07/30/20 0910  BP: (!) 151/108  Pulse: 67  Temp: 97.9 F (36.6 C)  TempSrc: Oral  SpO2: 97%  Weight: 176 lb 9.6 oz (80.1 kg)  Height: 5\' 10"  (1.778 m)  PainSc: 6   PainLoc: Buttocks   Body mass index is 25.34 kg/m.  Exam was conducted with a chaperone present. Examination of the anorectal area demonstrates small amount of light yellow mucus.  The hemorrhoidectomy site is healing well.  Due to concern for potential fistula, I performed a speculum exam with the patient in stirrups.  There is an approximately 1.5 mm opening in the proximal vagina.  Digital pressure in the rectum does express mucus from the site, confirming the presence of a fistula.  The fistula appears to be somewhat epithelialized, so I am not certain as to its age, but I did not see a fistula on anoscopy at the time of her surgery, so presumably this is a result of her hemorrhoidectomy.  Impression and plan: This is a 59 year old woman who underwent single column anterior hemorrhoidectomy. 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  As this is outside the scope of my practice, as far as repair, I have placed a  referral to Dr. Marcello Moores (colon and rectal surgery) with [facility] for further evaluation and management.  In addition, her pain medications were refilled today.  I will see her on an as-needed basis.

## 2020-07-30 NOTE — Patient Instructions (Addendum)
Dr.Cannon referred patient to Colorectal Surgeon. Dr.Alicia Thomas office will contact you to get you scheduled for an appointment.  If you have not heard from their office in a week, please give our office a call.  GENERAL POST-OPERATIVE PATIENT INSTRUCTIONS   WOUND CARE INSTRUCTIONS:  Keep a dry clean dressing on the wound if there is drainage. The initial bandage may be removed after 24 hours.  Once the wound has quit draining you may leave it open to air.  If clothing rubs against the wound or causes irritation and the wound is not draining you may cover it with a dry dressing during the daytime.  Try to keep the wound dry and avoid ointments on the wound unless directed to do so.  If the wound becomes bright red and painful or starts to drain infected material that is not clear, please contact your physician immediately.  If the wound is mildly pink and has a thick firm ridge underneath it, this is normal, and is referred to as a healing ridge.  This will resolve over the next 4-6 weeks.  BATHING: You may shower if you have been informed of this by your surgeon. However, Please do not submerge in a tub, hot tub, or pool until incisions are completely sealed or have been told by your surgeon that you may do so.  DIET:  You may eat any foods that you can tolerate.  It is a good idea to eat a high fiber diet and take in plenty of fluids to prevent constipation.  If you do become constipated you may want to take a mild laxative or take ducolax tablets on a daily basis until your bowel habits are regular.  Constipation can be very uncomfortable, along with straining, after recent surgery.  ACTIVITY:  You are encouraged to cough and deep breath or use your incentive spirometer if you were given one, every 15-30 minutes when awake.  This will help prevent respiratory complications and low grade fevers post-operatively if you had a general anesthetic.  You may want to hug a pillow when coughing and  sneezing to add additional support to the surgical area, if you had abdominal or chest surgery, which will decrease pain during these times.  You are encouraged to walk and engage in light activity for the next two weeks.  You should not lift more than 20 pounds, until 4 to 6 weeks after surgery as it could put you at increased risk for complications.  Twenty pounds is roughly equivalent to a plastic bag of groceries. At that time- Listen to your body when lifting, if you have pain when lifting, stop and then try again in a few days. Soreness after doing exercises or activities of daily living is normal as you get back in to your normal routine.  MEDICATIONS:  Try to take narcotic medications and anti-inflammatory medications, such as tylenol, ibuprofen, naprosyn, etc., with food.  This will minimize stomach upset from the medication.  Should you develop nausea and vomiting from the pain medication, or develop a rash, please discontinue the medication and contact your physician.  You should not drive, make important decisions, or operate machinery when taking narcotic pain medication.  SUNBLOCK Use sun block to incision area over the next year if this area will be exposed to sun. This helps decrease scarring and will allow you avoid a permanent darkened area over your incision.  QUESTIONS:  Please feel free to call our office if you have any questions, and  we will be glad to assist you. [phone].    Anal Fistula  An anal fistula is a hole that develops between the bowel and the skin near the anus. The anus allows stool (feces) to leave the body. The anus has many tiny glands that make lubricating fluid. Sometimes, these glands become plugged and infected. This can cause a fluid-filled pocket (abscess) to form. An anal fistula often occurs when an abscess becomes infected and then develops into a hole between the bowel and the skin. What are the causes? In most cases, an anal fistula is caused by  a past or current buildup of pus around the anus (anal abscess). Other causes include:  A complication of surgery.  Injury to the rectum or the area around it.  Using high-energy beams (radiation) to treat the area around the rectum. What increases the risk? You are more likely to develop this condition if you have certain medical conditions or diseases, including:  Chronic inflammatory bowel disease, such as Crohn's disease or ulcerative colitis.  Colon cancer or rectal cancer.  Diverticular disease, such as diverticulitis.  A sexually transmitted infection, or STI, such as gonorrhea, chlamydia, or syphilis.  An infection that is caused by HIV. What are the signs or symptoms? Symptoms of this condition include:  Throbbing or constant pain that may be worse while you are sitting.  Swelling or irritation around the anus.  Pus or blood from an opening near the anus.  Pain when passing stool.  Fever or chills. How is this diagnosed? This condition is diagnosed based on:  A physical exam. This may include: ? An exam to find the external opening of the fistula. ? An exam with a probe or scope to help locate the internal opening of the fistula. ? An exam of the rectum with a gloved hand (digital rectal exam).  Imaging tests that use dye to find the exact location and path of the fistula. Tests may include: ? X-rays. ? Ultrasound. ? CT scan. ? MRI.  Other tests to find the cause of the anal fistula. How is this treated? This condition is most commonly treated with surgery. The type of surgery that is used will depend on where the fistula is located and how complex the fistula is. Surgery may include:  A fistulotomy. The whole fistula is opened up, and the contents are drained to promote healing.  Seton placement. A silk string (seton) is placed into the fistula during a fistulotomy. This helps to drain any infection and promote healing.  Advancement flap procedure.  Tissue is removed from your rectum or the skin around the anus and attached to the opening of the fistula.  Bioprosthetic plug. A cone-shaped plug is made from your tissue and is used to block the opening of the fistula. Some anal fistulas do not require surgery. A nonsurgical treatment option involves injecting a fibrin glue to seal the fistula. You also may be prescribed an antibiotic medicine to treat any infection. Follow these instructions at home: Medicines  Take over-the-counter and prescription medicines only as told by your health care provider.  If you were prescribed an antibiotic medicine, take it as told by your health care provider. Do not stop taking the antibiotic even if you start to feel better.  Use a stool softener or a laxative if told to do so by your health care provider. General instructions   Eat a high-fiber diet as told by your health care provider. This can help to prevent  constipation.  Drink enough fluid to keep your urine pale yellow.  Take a warm sitz bath for 15-20 minutes, 3-4 times per day, or as told by your health care provider. Sitz baths can ease your pain and discomfort and help with healing.  Follow good hygiene to keep the anal area as clean and dry as possible. Use wet toilet paper or a moist towelette after each bowel movement.  Keep all follow-up visits as told by your health care provider. This is important. Contact a health care provider if you have:  Increased pain that is not controlled with medicines.  New redness or swelling around the anal area.  New fluid, blood, or pus coming from the anal area.  Tenderness or warmth around the anal area. Get help right away if you have:  A fever.  Severe pain.  Chills or diarrhea.  Severe problems urinating or having a bowel movement. Summary  An anal fistula is a hole that develops between the bowel and the skin near the anus.  This condition is most often caused by a buildup of pus  around the anus (anal abscess). Other causes include a complication of surgery, an injury to the rectum, or the use of radiation to treat the rectal area.  This condition is most commonly treated with surgery.  Follow your health care provider's instructions about taking medicines, eating and drinking, or taking sitz baths.  Call your health care provider if you have more pain, swelling, or blood. Get help right away if you have fever, severe pain, or problems passing urine or stool. This information is not intended to replace advice given to you by your health care provider. Make sure you discuss any questions you have with your health care provider. Document Revised: 02/16/2018 Document Reviewed: 02/16/2018 Elsevier Patient Education  [place].

## 2020-07-30 NOTE — Telephone Encounter (Signed)
Pt called after her earlier appt w/Dr. Celine Ahr advising she forgot to ask for a refill on her pain medication while she was in the office.  She states she does not use them consistently, however would like to have some on hand in the even she has any more "bad days" w/pain. 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  Thank you

## 2020-08-03 ENCOUNTER — Telehealth (HOSPITAL_COMMUNITY): Payer: Self-pay | Admitting: *Deleted

## 2020-08-03 ENCOUNTER — Encounter: Payer: Self-pay | Admitting: General Surgery

## 2020-08-03 NOTE — Telephone Encounter (Signed)
Received notice from pharmacy requesting that medication be changed from Pristiq brand name to generic brand. Please advise.

## 2020-08-03 NOTE — Telephone Encounter (Signed)
Done, form signed

## 2020-08-03 NOTE — Telephone Encounter (Signed)
Great!

## 2020-09-28 ENCOUNTER — Telehealth (INDEPENDENT_AMBULATORY_CARE_PROVIDER_SITE_OTHER): Payer: 59 | Admitting: Psychiatry

## 2020-09-28 ENCOUNTER — Encounter (HOSPITAL_COMMUNITY): Payer: Self-pay | Admitting: Psychiatry

## 2020-09-28 ENCOUNTER — Other Ambulatory Visit: Payer: Self-pay

## 2020-09-28 DIAGNOSIS — F3341 Major depressive disorder, recurrent, in partial remission: Secondary | ICD-10-CM | POA: Diagnosis not present

## 2020-09-28 DIAGNOSIS — F5081 Binge eating disorder: Secondary | ICD-10-CM

## 2020-09-28 DIAGNOSIS — F3342 Major depressive disorder, recurrent, in full remission: Secondary | ICD-10-CM | POA: Diagnosis not present

## 2020-09-28 DIAGNOSIS — F411 Generalized anxiety disorder: Secondary | ICD-10-CM | POA: Diagnosis not present

## 2020-09-28 MED ORDER — TRAZODONE HCL 100 MG PO TABS: 100.0000 mg | ORAL_TABLET | Freq: Every day | ORAL | 0 refills | Status: DC

## 2020-09-28 MED ORDER — LAMOTRIGINE 150 MG PO TABS: 150.0000 mg | ORAL_TABLET | ORAL | 0 refills | Status: DC

## 2020-09-28 MED ORDER — PROPRANOLOL HCL 20 MG PO TABS: 20.0000 mg | ORAL_TABLET | Freq: Three times a day (TID) | ORAL | 0 refills | Status: DC | PRN

## 2020-09-28 MED ORDER — TOPIRAMATE 100 MG PO TABS: 200.0000 mg | ORAL_TABLET | Freq: Every day | ORAL | 0 refills | Status: DC

## 2020-09-28 MED ORDER — PRISTIQ 100 MG PO TB24: 100.0000 mg | ORAL_TABLET | ORAL | 1 refills | Status: DC
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
## 2020-11-12 ENCOUNTER — Encounter (HOSPITAL_COMMUNITY): Payer: Self-pay | Admitting: Psychiatry

## 2020-11-12 ENCOUNTER — Other Ambulatory Visit: Payer: Self-pay

## 2020-11-12 ENCOUNTER — Telehealth (INDEPENDENT_AMBULATORY_CARE_PROVIDER_SITE_OTHER): Payer: 59 | Admitting: Psychiatry

## 2020-11-12 DIAGNOSIS — F3342 Major depressive disorder, recurrent, in full remission: Secondary | ICD-10-CM

## 2020-11-12 DIAGNOSIS — F411 Generalized anxiety disorder: Secondary | ICD-10-CM

## 2020-11-12 DIAGNOSIS — F3341 Major depressive disorder, recurrent, in partial remission: Secondary | ICD-10-CM | POA: Diagnosis not present

## 2020-11-12 DIAGNOSIS — F5081 Binge eating disorder: Secondary | ICD-10-CM

## 2020-11-12 MED ORDER — PROPRANOLOL HCL 20 MG PO TABS: 20.0000 mg | ORAL_TABLET | Freq: Three times a day (TID) | ORAL | 0 refills | Status: DC | PRN

## 2020-11-12 MED ORDER — LAMOTRIGINE 150 MG PO TABS: 150.0000 mg | ORAL_TABLET | ORAL | 0 refills | Status: DC

## 2020-11-12 MED ORDER — PRISTIQ 100 MG PO TB24: 100.0000 mg | ORAL_TABLET | ORAL | 1 refills | Status: DC

## 2020-11-12 MED ORDER — TOPIRAMATE 100 MG PO TABS: 200.0000 mg | ORAL_TABLET | Freq: Every day | ORAL | 0 refills | Status: DC

## 2020-11-12 MED ORDER — TRAZODONE HCL 100 MG PO TABS: 100.0000 mg | ORAL_TABLET | Freq: Every day | ORAL | 0 refills | Status: DC
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
## 2020-11-16 ENCOUNTER — Ambulatory Visit: Payer: Self-pay | Admitting: General Surgery

## 2020-11-16 NOTE — H&P (Signed)
The patient is a 60 year old female who presents with a complaint of anal problems. 59-year-old female who underwent hemorrhoidectomy in September 2021. Approximately one week later she developed stool per vagina. 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 She was sent to me for further evaluation. In reviewing the operative note, it appears to her surgery was to the right anterior hemorrhoidal column and this was done with a Harmonic scalpel. She is now 3 months from surgery and continues to have leakage of air and liquid stool into her vagina.   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)  Past Surgical History (Daryan Buell, MD; 11/16/2020 1:48 PM) Appendectomy Colon Polyp Removal - Colonoscopy Gallbladder Surgery - Laparoscopic Gastric Bypass Hemorrhoidectomy Hysterectomy (not due to cancer) - Partial Spinal Surgery - Lower Back  Diagnostic Studies History (Marguerite Barba, MD; 11/16/2020 1:48 PM) Colonoscopy 1-5 years ago Mammogram within last year Pap Smear >5 years ago  Allergies (Tekla Malachowski, MD; 11/16/2020 1:48 PM) Allergies Reconciled Penicillin V *PENICILLINS* Losartan Potassium *CHEMICALS*  Medication History (Kelly Dockery, LPN; 11/16/2020 1:32 PM) lamoTRIgine (150MG Tablet, Oral) Active. Pristiq (100MG Tablet ER 24HR, Oral) Active. Topiramate (200MG Tablet, Oral) Active. traZODone HCl (100MG Tablet, Oral) Active. Ferrous Sulfate (325 (65 Fe)MG Tablet DR, Oral) Active. Magnesium Oxide (400MG Tablet, Oral) Active. rOPINIRole HCl (Oral) Specific strength unknown - Active. Lasix (20MG Tablet, Oral) Active. Propranolol HCl (20MG Tablet, Oral) Active. traMADol HCl (Oral) Specific strength unknown - Active. Tylenol 8 Hour (650MG Tablet ER, Oral) Active. Medications Reconciled  Social History (Shadai Mcclane, MD; 11/16/2020 1:48  PM) Alcohol use Occasional alcohol use. Caffeine use Coffee, Tea. No drug use Tobacco use Never smoker.  Family History (Amberlie Gaillard, MD; 11/16/2020 1:48 PM) Arthritis Mother. Breast Cancer Sister. Depression Father, Mother, Sister, Son. Hypertension Mother, Sister. Respiratory Condition Father, Mother.  Pregnancy / Birth History (Elenore Wanninger, MD; 11/16/2020 1:48 PM) Age at menarche 14 years. Gravida 1 Maternal age 21-25 Para 1  Other Problems (Davy Westmoreland, MD; 11/16/2020 1:48 PM) Anxiety Disorder Arthritis Back Pain Depression Hemorrhoids High blood pressure Thyroid Disease     Review of Systems (Gabrial Poppell MD; 11/16/2020 1:49 PM) General Not Present- Appetite Loss, Chills, Fatigue, Fever, Night Sweats, Weight Gain and Weight Loss. Skin Not Present- Change in Wart/Mole, Dryness, Hives, Jaundice, New Lesions, Non-Healing Wounds, Rash and Ulcer. HEENT Present- Wears glasses/contact lenses. Not Present- Earache, Hearing Loss, Hoarseness, Nose Bleed, Oral Ulcers, Ringing in the Ears, Seasonal Allergies, Sinus Pain, Sore Throat, Visual Disturbances and Yellow Eyes. Respiratory Not Present- Bloody sputum, Chronic Cough, Difficulty Breathing, Snoring and Wheezing. Cardiovascular Present- Leg Cramps. Not Present- Chest Pain, Difficulty Breathing Lying Down, Palpitations, Rapid Heart Rate, Shortness of Breath and Swelling of Extremities. Gastrointestinal Present- Hemorrhoids. Not Present- Abdominal Pain, Bloating, Bloody Stool, Change in Bowel Habits, Chronic diarrhea, Constipation, Difficulty Swallowing, Excessive gas, Gets full quickly at meals, Indigestion, Nausea, Rectal Pain and Vomiting. Female Genitourinary Not Present- Frequency, Nocturia, Painful Urination, Pelvic Pain and Urgency. Musculoskeletal Present- Back Pain and Joint Stiffness. Not Present- Joint Pain, Muscle Pain, Muscle Weakness and Swelling of Extremities. Neurological Not Present-  Decreased Memory, Fainting, Headaches, Numbness, Seizures, Tingling, Tremor, Trouble walking and Weakness. Psychiatric Present- Anxiety. Not Present- Bipolar, Change in Sleep Pattern, Depression, Fearful and Frequent crying. Endocrine Present- Hot flashes. Not Present- Cold Intolerance, Excessive Hunger, Hair Changes, Heat Intolerance and New Diabetes. Hematology Not Present- Blood Thinners, Easy Bruising, Excessive bleeding,   Gland problems, HIV and Persistent Infections.   Physical Exam Leighton Ruff MD; 9/32/3557 2:07 PM)  General Mental Status-Alert. General Appearance-Cooperative.  Rectal Anorectal Exam External - normal external exam. Internal - Note: Small punctate lesion at posterior midline, presumably residual fistula.   Results Leighton Ruff MD; 01/05/253 2:08 PM) Procedures  Name Value Date ANOSCOPY, DIAGNOSTIC (27062) [ Hemorrhoids ] Procedure Other: Procedure: Anoscopy....Marland KitchenMarland KitchenSurgeon: Marcello Moores....Marland KitchenMarland KitchenAfter the risks and benefits were explained, verbal consent was obtained for above procedure. A medical assistant chaperone was present thoroughout the entire procedure. 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 After 3 months. She continues to have a persistent fistula anterior midline. It appears to be approximately 4-5 mm in size. We discussed surgical correction which included mucosal advancement flap. We discussed that the success rate of this surgery is approximately 65-70%. We discussed the typical postoperative symptoms as well as healing time. All questions were answered. She would like to proceed with the  surgery in late February possible.

## 2020-11-16 NOTE — H&P (View-Only) (Signed)
The patient is a 60 year old female who presents with a complaint of anal problems. 60 year old female who underwent hemorrhoidectomy in September 2021. Approximately one week later she developed stool per vagina. 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 She was sent to me for further evaluation. In reviewing the operative note, it appears to her surgery was to the right anterior hemorrhoidal column and this was done with a Harmonic scalpel. She is now 3 months from surgery and continues to have leakage of air and liquid stool into her vagina.   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)  Past Surgical History Leighton Ruff, MD; 7/61/9509 1:48 PM) Appendectomy Colon Polyp Removal - Colonoscopy Gallbladder Surgery - Laparoscopic Gastric Bypass Hemorrhoidectomy Hysterectomy (not due to cancer) - Partial Spinal Surgery - Lower Back  Diagnostic Studies History Leighton Ruff, MD; 01/10/7123 1:48 PM) Colonoscopy 1-5 years ago Mammogram within last year Pap Smear >5 years ago  Allergies Leighton Ruff, MD; 5/80/9983 1:48 PM) Allergies Reconciled Penicillin V *PENICILLINS* Losartan Potassium *CHEMICALS*  Medication History Mammie Lorenzo, LPN; 3/82/5053 9:76 PM) lamoTRIgine (150MG  Tablet, Oral) Active. Pristiq (100MG  Tablet ER 24HR, Oral) Active. Topiramate (200MG  Tablet, Oral) Active. traZODone HCl (100MG  Tablet, Oral) Active. Ferrous Sulfate (325 (65 Fe)MG Tablet DR, Oral) Active. Magnesium Oxide (400MG  Tablet, Oral) Active. rOPINIRole HCl (Oral) Specific strength unknown - Active. Lasix (20MG  Tablet, Oral) Active. Propranolol HCl (20MG  Tablet, Oral) Active. traMADol HCl (Oral) Specific strength unknown - Active. Tylenol 8 Hour (650MG  Tablet ER, Oral) Active. Medications Reconciled  Social History Leighton Ruff, MD; 7/34/1937 1:48  PM) Alcohol use Occasional alcohol use. Caffeine use Coffee, Tea. No drug use Tobacco use Never smoker.  Family History Leighton Ruff, MD; 06/18/4096 1:48 PM) Arthritis Mother. Breast Cancer Sister. Depression Father, Mother, Sister, Son. Hypertension Mother, Sister. Respiratory Condition Father, Mother.  Pregnancy / Birth History Leighton Ruff, MD; 3/53/2992 1:48 PM) Age at menarche 56 years. Gravida 1 Maternal age 37-25 Para 25  Other Problems Leighton Ruff, MD; 02/10/8340 1:48 PM) Anxiety Disorder Arthritis Back Pain Depression Hemorrhoids High blood pressure Thyroid Disease     Review of Systems Leighton Ruff MD; 9/62/2297 1:49 PM) General Not Present- Appetite Loss, Chills, Fatigue, Fever, Night Sweats, Weight Gain and Weight Loss. Skin Not Present- Change in Wart/Mole, Dryness, Hives, Jaundice, New Lesions, Non-Healing Wounds, Rash and Ulcer. HEENT Present- Wears glasses/contact lenses. Not Present- Earache, Hearing Loss, Hoarseness, Nose Bleed, Oral Ulcers, Ringing in the Ears, Seasonal Allergies, Sinus Pain, Sore Throat, Visual Disturbances and Yellow Eyes. Respiratory Not Present- Bloody sputum, Chronic Cough, Difficulty Breathing, Snoring and Wheezing. Cardiovascular Present- Leg Cramps. Not Present- Chest Pain, Difficulty Breathing Lying Down, Palpitations, Rapid Heart Rate, Shortness of Breath and Swelling of Extremities. Gastrointestinal Present- Hemorrhoids. Not Present- Abdominal Pain, Bloating, Bloody Stool, Change in Bowel Habits, Chronic diarrhea, Constipation, Difficulty Swallowing, Excessive gas, Gets full quickly at meals, Indigestion, Nausea, Rectal Pain and Vomiting. Female Genitourinary Not Present- Frequency, Nocturia, Painful Urination, Pelvic Pain and Urgency. Musculoskeletal Present- Back Pain and Joint Stiffness. Not Present- Joint Pain, Muscle Pain, Muscle Weakness and Swelling of Extremities. Neurological Not Present-  Decreased Memory, Fainting, Headaches, Numbness, Seizures, Tingling, Tremor, Trouble walking and Weakness. Psychiatric Present- Anxiety. Not Present- Bipolar, Change in Sleep Pattern, Depression, Fearful and Frequent crying. Endocrine Present- Hot flashes. Not Present- Cold Intolerance, Excessive Hunger, Hair Changes, Heat Intolerance and New Diabetes. Hematology Not Present- Blood Thinners, Easy Bruising, Excessive bleeding,  Gland problems, HIV and Persistent Infections.   Physical Exam Leighton Ruff MD; 9/32/3557 2:07 PM)  General Mental Status-Alert. General Appearance-Cooperative.  Rectal Anorectal Exam External - normal external exam. Internal - Note: Small punctate lesion at posterior midline, presumably residual fistula.   Results Leighton Ruff MD; 01/05/253 2:08 PM) Procedures  Name Value Date ANOSCOPY, DIAGNOSTIC (27062) [ Hemorrhoids ] Procedure Other: Procedure: Anoscopy....Marland KitchenMarland KitchenSurgeon: Marcello Moores....Marland KitchenMarland KitchenAfter the risks and benefits were explained, verbal consent was obtained for above procedure. A medical assistant chaperone was present thoroughout the entire procedure. 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 After 3 months. She continues to have a persistent fistula anterior midline. It appears to be approximately 4-5 mm in size. We discussed surgical correction which included mucosal advancement flap. We discussed that the success rate of this surgery is approximately 65-70%. We discussed the typical postoperative symptoms as well as healing time. All questions were answered. She would like to proceed with the  surgery in late February possible.

## 2020-11-25 DIAGNOSIS — Z90710 Acquired absence of both cervix and uterus: Secondary | ICD-10-CM | POA: Insufficient documentation

## 2020-12-07 ENCOUNTER — Other Ambulatory Visit: Payer: Self-pay

## 2020-12-07 ENCOUNTER — Other Ambulatory Visit (HOSPITAL_COMMUNITY)
Admission: RE | Admit: 2020-12-07 | Discharge: 2020-12-07 | Disposition: A | Discharge status: Home / Self Care | Payer: 59 | Source: Ambulatory Visit | Attending: General Surgery | Admitting: General Surgery

## 2020-12-07 ENCOUNTER — Encounter (HOSPITAL_BASED_OUTPATIENT_CLINIC_OR_DEPARTMENT_OTHER): Payer: Self-pay | Admitting: General Surgery

## 2020-12-07 DIAGNOSIS — Z20822 Contact with and (suspected) exposure to covid-19: Secondary | ICD-10-CM | POA: Insufficient documentation

## 2020-12-07 DIAGNOSIS — Z01812 Encounter for preprocedural laboratory examination: Secondary | ICD-10-CM | POA: Diagnosis not present

## 2020-12-07 LAB — SARS CORONAVIRUS 2 (TAT 6-24 HRS): SARS Coronavirus 2: NEGATIVE

## 2020-12-07 NOTE — Progress Notes (Signed)
Spoke w/ via phone for pre-op interview---pt Lab needs dos----  I stat ekg             Lab results------none COVID test ------12-07-2020 1450 Arrive at -------700 am 12-10-2020 NPO after MN NO Solid Food.  Clear liquids from MN until---600 then npo Medications to take morning of surgery -----pt wishes to take me medications am of surgery due to history of post op nausea Diabetic medication -----n/a Patient Special Instructions -----none Pre-Op special Istructions -----none Patient verbalized understanding of instructions that were given at this phone interview. Patient denies shortness of breath, chest pain, fever, cough at this phone interview.

## 2020-12-10 ENCOUNTER — Ambulatory Visit (HOSPITAL_BASED_OUTPATIENT_CLINIC_OR_DEPARTMENT_OTHER): Payer: 59 | Admitting: Anesthesiology

## 2020-12-10 ENCOUNTER — Other Ambulatory Visit: Payer: Self-pay

## 2020-12-10 ENCOUNTER — Encounter (HOSPITAL_BASED_OUTPATIENT_CLINIC_OR_DEPARTMENT_OTHER)
Admission: RE | Disposition: A | Discharge status: Home / Self Care | Payer: Self-pay | Source: Home / Self Care | Attending: General Surgery

## 2020-12-10 ENCOUNTER — Ambulatory Visit (HOSPITAL_BASED_OUTPATIENT_CLINIC_OR_DEPARTMENT_OTHER)
Admission: RE | Admit: 2020-12-10 | Discharge: 2020-12-10 | Disposition: A | Discharge status: Home / Self Care | Payer: 59 | Attending: General Surgery | Admitting: General Surgery

## 2020-12-10 ENCOUNTER — Encounter (HOSPITAL_BASED_OUTPATIENT_CLINIC_OR_DEPARTMENT_OTHER): Payer: Self-pay | Admitting: General Surgery

## 2020-12-10 DIAGNOSIS — Z79899 Other long term (current) drug therapy: Secondary | ICD-10-CM | POA: Diagnosis not present

## 2020-12-10 DIAGNOSIS — Z9884 Bariatric surgery status: Secondary | ICD-10-CM | POA: Insufficient documentation

## 2020-12-10 DIAGNOSIS — K9189 Other postprocedural complications and disorders of digestive system: Secondary | ICD-10-CM | POA: Insufficient documentation

## 2020-12-10 DIAGNOSIS — N823 Fistula of vagina to large intestine: Secondary | ICD-10-CM | POA: Insufficient documentation

## 2020-12-10 DIAGNOSIS — Z880 Allergy status to penicillin: Secondary | ICD-10-CM | POA: Insufficient documentation

## 2020-12-10 DIAGNOSIS — Z888 Allergy status to other drugs, medicaments and biological substances status: Secondary | ICD-10-CM | POA: Insufficient documentation

## 2020-12-10 HISTORY — DX: Presence of spectacles and contact lenses: Z97.3

## 2020-12-10 HISTORY — PX: MUCOSAL ADVANCEMENT FLAP: SHX6479

## 2020-12-10 LAB — POCT I-STAT, CHEM 8
BUN: 16 mg/dL (ref 6–20)
Calcium, Ion: 1.33 mmol/L (ref 1.15–1.40)
Chloride: 107 mmol/L (ref 98–111)
Creatinine, Ser: 0.8 mg/dL (ref 0.44–1.00)
Glucose, Bld: 89 mg/dL (ref 70–99)
HCT: 41 % (ref 36.0–46.0)
Hemoglobin: 13.9 g/dL (ref 12.0–15.0)
Potassium: 3.9 mmol/L (ref 3.5–5.1)
Sodium: 144 mmol/L (ref 135–145)
TCO2: 25 mmol/L (ref 22–32)

## 2020-12-10 SURGERY — CREATION, FLAP, MUCOSAL ADVANCEMENT
Anesthesia: Monitor Anesthesia Care | Site: Rectum

## 2020-12-10 MED ORDER — PROPOFOL 500 MG/50ML IV EMUL
INTRAVENOUS | Status: DC | PRN
  Administered 2020-12-10: 200 ug/kg/min via INTRAVENOUS

## 2020-12-10 MED ORDER — ACETAMINOPHEN 500 MG PO TABS
ORAL_TABLET | ORAL | Status: AC
  Filled 2020-12-10: qty 2

## 2020-12-10 MED ORDER — OXYCODONE HCL 5 MG PO TABS: 5.0000 mg | ORAL_TABLET | Freq: Once | ORAL | Status: DC | PRN

## 2020-12-10 MED ORDER — FENTANYL CITRATE (PF) 100 MCG/2ML IJ SOLN
INTRAMUSCULAR | Status: AC
  Filled 2020-12-10: qty 2

## 2020-12-10 MED ORDER — CELECOXIB 200 MG PO CAPS
ORAL_CAPSULE | ORAL | Status: AC
  Filled 2020-12-10: qty 1

## 2020-12-10 MED ORDER — FENTANYL CITRATE (PF) 250 MCG/5ML IJ SOLN
INTRAMUSCULAR | Status: DC | PRN
  Administered 2020-12-10: 25 ug via INTRAVENOUS

## 2020-12-10 MED ORDER — PROPOFOL 10 MG/ML IV BOLUS
INTRAVENOUS | Status: AC
  Filled 2020-12-10: qty 20

## 2020-12-10 MED ORDER — CELECOXIB 200 MG PO CAPS
200.0000 mg | ORAL_CAPSULE | ORAL | Status: AC
  Administered 2020-12-10: 200 mg via ORAL

## 2020-12-10 MED ORDER — LIDOCAINE 5 % EX OINT
TOPICAL_OINTMENT | CUTANEOUS | Status: DC | PRN
  Administered 2020-12-10: 1

## 2020-12-10 MED ORDER — EPHEDRINE 5 MG/ML INJ
INTRAVENOUS | Status: AC
  Filled 2020-12-10: qty 10

## 2020-12-10 MED ORDER — PROPOFOL 10 MG/ML IV BOLUS
INTRAVENOUS | Status: DC | PRN
Start: 2020-12-10 — End: 2020-12-10
  Administered 2020-12-10: 30 mg via INTRAVENOUS

## 2020-12-10 MED ORDER — SODIUM CHLORIDE 0.9% FLUSH: 3.0000 mL | Freq: Two times a day (BID) | INTRAVENOUS | Status: DC

## 2020-12-10 MED ORDER — MIDAZOLAM HCL 2 MG/2ML IJ SOLN
INTRAMUSCULAR | Status: AC
  Filled 2020-12-10: qty 2

## 2020-12-10 MED ORDER — AMISULPRIDE (ANTIEMETIC) 5 MG/2ML IV SOLN: 10.0000 mg | Freq: Once | INTRAVENOUS | Status: DC | PRN

## 2020-12-10 MED ORDER — OXYCODONE HCL 5 MG PO TABS: 5.0000 mg | ORAL_TABLET | Freq: Four times a day (QID) | ORAL | 0 refills | Status: DC | PRN

## 2020-12-10 MED ORDER — EPHEDRINE SULFATE-NACL 50-0.9 MG/10ML-% IV SOSY
PREFILLED_SYRINGE | INTRAVENOUS | Status: DC | PRN
  Administered 2020-12-10: 10 mg via INTRAVENOUS
  Administered 2020-12-10: 5 mg via INTRAVENOUS

## 2020-12-10 MED ORDER — BUPIVACAINE-EPINEPHRINE 0.5% -1:200000 IJ SOLN
INTRAMUSCULAR | Status: DC | PRN
  Administered 2020-12-10: 35 mL

## 2020-12-10 MED ORDER — GABAPENTIN 300 MG PO CAPS
ORAL_CAPSULE | ORAL | Status: AC
  Filled 2020-12-10: qty 1

## 2020-12-10 MED ORDER — ONDANSETRON HCL 4 MG/2ML IJ SOLN
INTRAMUSCULAR | Status: AC
  Filled 2020-12-10: qty 2

## 2020-12-10 MED ORDER — BUPIVACAINE LIPOSOME 1.3 % IJ SUSP: 20.0000 mL | Freq: Once | INTRAMUSCULAR | Status: DC

## 2020-12-10 MED ORDER — PROMETHAZINE HCL 25 MG/ML IJ SOLN: 6.2500 mg | INTRAMUSCULAR | Status: DC | PRN

## 2020-12-10 MED ORDER — HYDROMORPHONE HCL 1 MG/ML IJ SOLN: 0.2500 mg | INTRAMUSCULAR | Status: DC | PRN

## 2020-12-10 MED ORDER — GABAPENTIN 300 MG PO CAPS
300.0000 mg | ORAL_CAPSULE | ORAL | Status: AC
  Administered 2020-12-10: 300 mg via ORAL

## 2020-12-10 MED ORDER — BUPIVACAINE LIPOSOME 1.3 % IJ SUSP
INTRAMUSCULAR | Status: DC | PRN
Start: 2020-12-10 — End: 2020-12-10
  Administered 2020-12-10: 20 mL

## 2020-12-10 MED ORDER — OXYCODONE HCL 5 MG/5ML PO SOLN: 5.0000 mg | Freq: Once | ORAL | Status: DC | PRN

## 2020-12-10 MED ORDER — LACTATED RINGERS IV SOLN: INTRAVENOUS | Status: DC

## 2020-12-10 MED ORDER — ONDANSETRON HCL 4 MG/2ML IJ SOLN
INTRAMUSCULAR | Status: DC | PRN
  Administered 2020-12-10: 4 mg via INTRAVENOUS

## 2020-12-10 MED ORDER — MIDAZOLAM HCL 5 MG/5ML IJ SOLN
INTRAMUSCULAR | Status: DC | PRN
  Administered 2020-12-10: 2 mg via INTRAVENOUS

## 2020-12-10 MED ORDER — ACETAMINOPHEN 500 MG PO TABS
1000.0000 mg | ORAL_TABLET | ORAL | Status: AC
  Administered 2020-12-10: 1000 mg via ORAL

## 2020-12-10 SURGICAL SUPPLY — 49 items
BLADE EXTENDED COATED 6.5IN (ELECTRODE) IMPLANT
BLADE HEX COATED 2.75 (ELECTRODE) IMPLANT
BLADE SURG 10 STRL SS (BLADE) IMPLANT
BLADE SURG 15 STRL LF DISP TIS (BLADE) ×1 IMPLANT
BLADE SURG 15 STRL SS (BLADE) ×2
BRIEF STRETCH FOR OB PAD LRG (UNDERPADS AND DIAPERS) ×2 IMPLANT
CANISTER SUCT 3000ML PPV (MISCELLANEOUS) IMPLANT
COVER BACK TABLE 60X90IN (DRAPES) ×2 IMPLANT
COVER WAND RF STERILE (DRAPES) ×2 IMPLANT
DECANTER SPIKE VIAL GLASS SM (MISCELLANEOUS) ×2 IMPLANT
DRAPE LAPAROTOMY T 102X78X121 (DRAPES) ×2 IMPLANT
DRAPE UTILITY XL STRL (DRAPES) ×2 IMPLANT
DRSG PAD ABDOMINAL 8X10 ST (GAUZE/BANDAGES/DRESSINGS) ×2 IMPLANT
ELECT BLADE TIP CTD 4 INCH (ELECTRODE) ×2 IMPLANT
ELECT REM PT RETURN 9FT ADLT (ELECTROSURGICAL) ×2
ELECTRODE REM PT RTRN 9FT ADLT (ELECTROSURGICAL) ×1 IMPLANT
GAUZE SPONGE 4X4 12PLY STRL (GAUZE/BANDAGES/DRESSINGS) ×2 IMPLANT
GAUZE SPONGE 4X4 12PLY STRL LF (GAUZE/BANDAGES/DRESSINGS) ×2 IMPLANT
GLOVE SURG ENC MOIS LTX SZ6.5 (GLOVE) ×2 IMPLANT
GLOVE SURG UNDER POLY LF SZ7 (GLOVE) ×2 IMPLANT
GOWN STRL REUS W/TWL XL LVL3 (GOWN DISPOSABLE) ×2 IMPLANT
IV CATH 14GX2 1/4 (CATHETERS) IMPLANT
KIT SIGMOIDOSCOPE (SET/KITS/TRAYS/PACK) IMPLANT
KIT TURNOVER CYSTO (KITS) ×2 IMPLANT
LUBRICANT JELLY K Y 4OZ (MISCELLANEOUS) ×2 IMPLANT
NEEDLE HYPO 22GX1.5 SAFETY (NEEDLE) ×2 IMPLANT
NS IRRIG 500ML POUR BTL (IV SOLUTION) ×2 IMPLANT
PACK BASIN DAY SURGERY FS (CUSTOM PROCEDURE TRAY) ×2 IMPLANT
PAD ARMBOARD 7.5X6 YLW CONV (MISCELLANEOUS) IMPLANT
PENCIL SMOKE EVACUATOR (MISCELLANEOUS) ×2 IMPLANT
SUCTION FRAZIER HANDLE 10FR (MISCELLANEOUS)
SUCTION TUBE FRAZIER 10FR DISP (MISCELLANEOUS) IMPLANT
SUT CHROMIC 2 0 SH (SUTURE) ×2 IMPLANT
SUT CHROMIC 3 0 SH 27 (SUTURE) ×6 IMPLANT
SUT GUT CHROMIC 3 0 (SUTURE) IMPLANT
SUT SILK 3 0 SH 30 (SUTURE) ×4 IMPLANT
SUT VIC AB 2-0 SH 27 (SUTURE)
SUT VIC AB 2-0 SH 27XBRD (SUTURE) IMPLANT
SUT VIC AB 3-0 SH 18 (SUTURE) IMPLANT
SUT VIC AB 3-0 SH 27 (SUTURE) ×8
SUT VIC AB 3-0 SH 27X BRD (SUTURE) ×1 IMPLANT
SUT VIC AB 3-0 SH 27XBRD (SUTURE) ×3 IMPLANT
SYR 10ML LL (SYRINGE) ×2 IMPLANT
SYR 20ML LL LF (SYRINGE) ×2 IMPLANT
SYR BULB IRRIG 60ML STRL (SYRINGE) IMPLANT
SYR CONTROL 10ML LL (SYRINGE) ×2 IMPLANT
TOWEL OR 17X26 10 PK STRL BLUE (TOWEL DISPOSABLE) ×2 IMPLANT
TUBE CONNECTING 12X1/4 (SUCTIONS) ×2 IMPLANT
YANKAUER SUCT BULB TIP NO VENT (SUCTIONS) ×2 IMPLANT

## 2020-12-10 NOTE — Transfer of Care (Signed)
Immediate Anesthesia Transfer of Care Note  Patient: KERRIANN KAMPHUIS  Procedure(s) Performed: MUCOSAL ADVANCEMENT FLAP (N/A Rectum)  Patient Location: PACU  Anesthesia Type:MAC  Level of Consciousness: awake, alert , oriented and patient cooperative  Airway & Oxygen Therapy: Patient Spontanous Breathing  Post-op Assessment: Report given to RN and Post -op Vital signs reviewed and stable  Post vital signs: Reviewed and stable  Last Vitals:  Vitals Value Taken Time  BP 120/79 12/10/20 1205  Temp    Pulse 68 12/10/20 1208  Resp 16 12/10/20 1208  SpO2 100 % 12/10/20 1208  Vitals shown include unvalidated device data.  Last Pain:  Vitals:   12/10/20 0730  TempSrc: Oral  PainSc: 0-No pain      Patients Stated Pain Goal: 7 (05/39/76 7341)  Complications: No complications documented.

## 2020-12-10 NOTE — Op Note (Signed)
12/10/2020  11:58 AM  PATIENT:  Toni Green  60 y.o. female  Patient Care Team: Glean Hess, MD as PCP - General (Internal Medicine) Rainey Pines, MD as Referring Physician (Psychiatry) Sharlet Salina, MD as Referring Physician (Physical Medicine and Rehabilitation)  PRE-OPERATIVE DIAGNOSIS:  RECTOVAGINAL FISTULA  POST-OPERATIVE DIAGNOSIS:  RECTOVAGINAL FISTULA  PROCEDURE:   MUCOSAL ADVANCEMENT FLAP    Surgeon(s): Leighton Ruff, MD  ASSISTANT: Coralyn Pear Economopoulos MD    ANESTHESIA:   local and MAC  SPECIMEN:  No Specimen  DISPOSITION OF SPECIMEN:  N/A  COUNTS:  YES  PLAN OF CARE: Discharge to home after PACU  PATIENT DISPOSITION:  PACU - hemodynamically stable.  INDICATION: 60 y.o. F with rectovaginal fistula   OR FINDINGS: distal fistula anterior midline, at dentate line  DESCRIPTION: the patient was identified in the preoperative holding area and taken to the OR where they were laid on the operating room table.  MAC anesthesia was induced without difficulty. The patient was then positioned in prone jackknife position with buttocks gently taped apart.  The patient was then prepped and draped in usual sterile fashion.  SCDs were noted to be in place prior to the initiation of anesthesia. A surgical timeout was performed indicating the correct patient, procedure, positioning and need for preoperative antibiotics.  A rectal block was performed using Marcaine with epinephrine.    I began with a digital rectal exam.  The fistula could be palpated at anterior midline at the dentate line.  It was approximately 2 to 3 mm in diameter.  I then placed a Hill-Ferguson anoscope into the anal canal and evaluated this completely.  The remaining distal rectal mucosa appeared normal without inflammation or injury.  I injected Marcaine with epinephrine into the flap for assistance with hemostasis.  I began by making an incision distal to the fistula using electrocautery.   Dissection was carried down around the fistula site and through the previous scar.  I continued until I reached the mucosal layer.  I dissected the mucosal layer out off of the rectovaginal septum using blunt dissection and electrocautery.  I marked edges of the flap approximately twice as large as the distal edge on either side of the rectal mucosa.  I then continued my dissection down to these areas laterally.  Hemostasis was achieved as we went.  A portion of the rectovaginal septum was lifted during this procedure.  I was then able to dissect this away from the rectal mucosa and so this down to the vaginal wall using 3-0 Vicryl interrupted sutures.  Once I was able to mobilize enough of the flap to allow for closure over the fistula site, the fistula site was closed using interrupted 3-0 silk sutures.  I then fixed the distal edge of the flap to the anoderm using interrupted 3-0 Vicryl sutures.  The lateral edges were then closed using a running 3-0 chromic suture on either side.  Hemostasis was good.  I then used several interrupted 3-0 chromic sutures to reapproximate the mucosal layer to the anoderm distally.  Once this was complete the retractors were removed.  The anal canal remained patent.  I injected additional Exparel into the distal portion of the incision for added postoperative pain control.  Lidocaine ointment and sterile dressing were applied.  The patient was then awakened from anesthesia and sent to the postanesthesia care unit in stable condition.  All counts were correct per operating room staff.  I was personally present and performed the entire  procedure as documented in my operative note.

## 2020-12-10 NOTE — Discharge Instructions (Addendum)
ANORECTAL SURGERY: POST OP INSTRUCTIONS 1. Take your usually prescribed home medications unless otherwise directed. 2. DIET: During the first few hours after surgery sip on some liquids until you are able to urinate.  It is normal to not urinate for several hours after this surgery.  If you feel uncomfortable, please contact the office for instructions.  After you are able to urinate,you may eat, if you feel like it.  Follow a light bland diet the first 24 hours after arrival home, such as soup, liquids, crackers, etc.  Be sure to include lots of fluids daily (6-8 glasses).  Avoid fast food or heavy meals, as your are more likely to get nauseated.  Eat a low fat diet the next few days after surgery.  Limit caffeine intake to 1-2 servings a day. 3. PAIN CONTROL: a. Pain is best controlled by a usual combination of several different methods TOGETHER: i. Muscle relaxation: Soak in a warm bath (or Sitz bath) three times a day and after bowel movements.  Continue to do this until all pain is resolved. ii. Over the counter pain medication iii. Prescription pain medication b. Most patients will experience some swelling and discomfort in the anus/rectal area and incisions.  Heat such as warm towels, sitz baths, warm baths, etc to help relax tight/sore spots and speed recovery.  Some people prefer to use ice, especially in the first couple days after surgery, as it may decrease the pain and swelling, or alternate between ice & heat.  Experiment to what works for you.  Swelling and bruising can take several weeks to resolve.  Pain can take even longer to completely resolve. c. It is helpful to take an over-the-counter pain medication regularly for the first few weeks.  Choose one of the following that works best for you: i. Naproxen (Aleve, etc)  Two 220mg tabs twice a day ii. Ibuprofen (Advil, etc) Three 200mg tabs four times a day (every meal & bedtime) d. A  prescription for pain medication (such as percocet,  oxycodone, hydrocodone, etc) should be given to you upon discharge.  Take your pain medication as prescribed.  i. If you are having problems/concerns with the prescription medicine (does not control pain, nausea, vomiting, rash, itching, etc), please call us [phone] to see if we need to switch you to a different pain medicine that will work better for you and/or control your side effect better. ii. If you need a refill on your pain medication, please contact your pharmacy.  They will contact our office to request authorization. Prescriptions will not be filled after 5 pm or on week-ends. 4. KEEP YOUR BOWELS REGULAR and AVOID CONSTIPATION a. The goal is one to two soft bowel movements a day.  You should at least have a bowel movement every other day. b. Avoid getting constipated.  Between the surgery and the pain medications, it is common to experience some constipation. This can be very painful after rectal surgery.  Increasing fluid intake and taking a fiber supplement (such as Metamucil, Citrucel, FiberCon, etc) 1-2 times a day regularly will usually help prevent this problem from occurring.  A stool softener like colace is also recommended.  This can be purchased over the counter at your pharmacy.  You can take it up to 3 times a day.  If you do not have a bowel movement after 24 hrs since your surgery, take one does of milk of magnesia.  If you still haven't had a bowel movement 8-12 hours after   that dose, take another dose.  If you don't have a bowel movement 48 hrs after surgery, purchase a Fleets enema from the drug store and administer gently per package instructions.  If you still are having trouble with your bowel movements after that, please call the office for further instructions. c. If you develop diarrhea or have many loose bowel movements, simplify your diet to bland foods & liquids for a few days.  Stop any stool softeners and decrease your fiber supplement.  Switching to mild  anti-diarrheal medications (Kayopectate, Pepto Bismol) can help.  If this worsens or does not improve, please call us.  5. Wound Care a. Remove your bandages before your first bowel movement or 8 hours after surgery.     b. Remove any wound packing material at this tim,e as well.  You do not need to repack the wound unless instructed otherwise.  Wear an absorbent pad or soft cotton gauze in your underwear to catch any drainage and help keep the area clean. You should change this every 2-3 hours while awake. c. Keep the area clean and dry.  Bathe / shower every day, especially after bowel movements.  Keep the area clean by showering / bathing over the incision / wound.   It is okay to soak an open wound to help wash it.  Wet wipes or showers / gentle washing after bowel movements is often less traumatic than regular toilet paper. d. You may have some styrofoam-like soft packing in the rectum which will come out with the first bowel movement.  e. You will often notice bleeding with bowel movements.  This should slow down by the end of the first week of surgery f. Expect some drainage.  This should slow down, too, by the end of the first week of surgery.  Wear an absorbent pad or soft cotton gauze in your underwear until the drainage stops. g. Do Not sit on a rubber or pillow ring.  This can make you symptoms worse.  You may sit on a soft pillow if needed.  6. ACTIVITIES as tolerated:   a. You may resume regular (light) daily activities beginning the next day--such as daily self-care, walking, climbing stairs--gradually increasing activities as tolerated.  If you can walk 30 minutes without difficulty, it is safe to try more intense activity such as jogging, treadmill, bicycling, low-impact aerobics, swimming, etc. b. Save the most intensive and strenuous activity for last such as sit-ups, heavy lifting, contact sports, etc  Refrain from any heavy lifting or straining until you are off narcotics for pain  control.   c. You may drive when you are no longer taking prescription pain medication, you can comfortably sit for long periods of time, and you can safely maneuver your car and apply brakes. d. You may have sexual intercourse when it is comfortable.  7. FOLLOW UP in our office a. Please call [facility] at [phone] to set up an appointment to see your surgeon in the office for a follow-up appointment approximately 3-4 weeks after your surgery. b. Make sure that you call for this appointment the day you arrive home to insure a convenient appointment time. 10. IF YOU HAVE DISABILITY OR FAMILY LEAVE FORMS, BRING THEM TO THE OFFICE FOR PROCESSING.  DO NOT GIVE THEM TO YOUR DOCTOR.     WHEN TO CALL US [phone]: 1. Poor pain control 2. Reactions / problems with new medications (rash/itching, nausea, etc)  3. Fever over 101.5 F (38.5 C) 4.   Inability to urinate 5. Nausea and/or vomiting 6. Worsening swelling or bruising 7. Continued bleeding from incision. 8. Increased pain, redness, or drainage from the incision  The clinic staff is available to answer your questions during regular business hours (8:30am-5pm).  Please don't hesitate to call and ask to speak to one of our nurses for clinical concerns.   A surgeon from [facility] is always on call at the hospitals   If you have a medical emergency, go to the nearest emergency room or call 911.    [facility], [place], [place], [place], [place]  [address] ? MAIN: [phone] ? TOLL FREE: [phone] ? FAX [phone] [email or web address]    Post Anesthesia Home Care Instructions  Activity: Get plenty of rest for the remainder of the day. A responsible individual must stay with you for 24 hours following the procedure.  For the next 24 hours, DO NOT: -Drive a car -Paediatric nurse -Drink alcoholic beverages -Take any medication unless instructed by your  physician -Make any legal decisions or sign important papers.  Meals: Start with liquid foods such as gelatin or soup. Progress to regular foods as tolerated. Avoid greasy, spicy, heavy foods. If nausea and/or vomiting occur, drink only clear liquids until the nausea and/or vomiting subsides. Call your physician if vomiting continues.  Special Instructions/Symptoms: Your throat may feel dry or sore from the anesthesia or the breathing tube placed in your throat during surgery. If this causes discomfort, gargle with warm salt water. The discomfort should disappear within 24 hours.

## 2020-12-10 NOTE — Anesthesia Preprocedure Evaluation (Signed)
Anesthesia Evaluation  Patient identified by MRN, date of birth, ID band Patient awake    Reviewed: Allergy & Precautions, NPO status , Patient's Chart, lab work & pertinent test results  History of Anesthesia Complications (+) PONV  Airway Mallampati: II  TM Distance: >3 FB Neck ROM: Full    Dental  (+) Teeth Intact   Pulmonary neg pulmonary ROS,    Pulmonary exam normal        Cardiovascular hypertension, Pt. on medications and Pt. on home beta blockers  Rhythm:Regular Rate:Normal     Neuro/Psych Seizures - (single incident 1985 post childbirth), Well Controlled,  Anxiety Depression    GI/Hepatic Neg liver ROS, Rectovaginal fistula   Endo/Other  Hypothyroidism   Renal/GU   negative genitourinary   Musculoskeletal  (+) Arthritis ,   Abdominal (+)  Abdomen: soft. Bowel sounds: normal.  Peds  Hematology  (+) anemia ,   Anesthesia Other Findings   Reproductive/Obstetrics                             Anesthesia Physical Anesthesia Plan  ASA: II  Anesthesia Plan: MAC   Post-op Pain Management:    Induction: Intravenous  PONV Risk Score and Plan: 3 and Ondansetron, Dexamethasone, Propofol infusion and Treatment may vary due to age or medical condition  Airway Management Planned: Simple Face Mask, Natural Airway and Nasal Cannula  Additional Equipment: None  Intra-op Plan:   Post-operative Plan:   Informed Consent: I have reviewed the patients History and Physical, chart, labs and discussed the procedure including the risks, benefits and alternatives for the proposed anesthesia with the patient or authorized representative who has indicated his/her understanding and acceptance.     Dental advisory given  Plan Discussed with: CRNA  Anesthesia Plan Comments: (Lab Results      Component                Value               Date                      WBC                       4.7                 02/28/2020                HGB                      13.9                12/10/2020                HCT                      41.0                12/10/2020                MCV                      94                  02/28/2020                PLT  222                 02/28/2020           Lab Results      Component                Value               Date                      NA                       144                 12/10/2020                K                        3.9                 12/10/2020                CO2                      23                  01/06/2020                GLUCOSE                  89                  12/10/2020                BUN                      16                  12/10/2020                CREATININE               0.80                12/10/2020                CALCIUM                  9.5                 01/06/2020                GFRNONAA                 77                  01/06/2020                GFRAA                    89                  01/06/2020          )        Anesthesia Quick Evaluation

## 2020-12-10 NOTE — Interval H&P Note (Signed)
History and Physical Interval Note:  12/10/2020 6:50 AM  Toni Green  has presented today for surgery, with the diagnosis of RECTOVAGINAL FISTULA.  The various methods of treatment have been discussed with the patient and family. After consideration of risks, benefits and other options for treatment, the patient has consented to  Procedure(s) with comments: [place] (N/A) - 60 MIN as a surgical intervention.  The patient's history has been reviewed, patient examined, no change in status, stable for surgery.  I have reviewed the patient's chart and labs.  Questions were answered to the patient's satisfaction.     Rosario Adie, MD  [facility]

## 2020-12-10 NOTE — Anesthesia Postprocedure Evaluation (Signed)
Anesthesia Post Note  Patient: Toni Green  Procedure(s) Performed: MUCOSAL ADVANCEMENT FLAP (N/A Rectum)     Patient location during evaluation: PACU Anesthesia Type: MAC Level of consciousness: awake and alert Pain management: pain level controlled Vital Signs Assessment: post-procedure vital signs reviewed and stable Respiratory status: spontaneous breathing, nonlabored ventilation, respiratory function stable and patient connected to nasal cannula oxygen Cardiovascular status: stable and blood pressure returned to baseline Postop Assessment: no apparent nausea or vomiting Anesthetic complications: no   No complications documented.  Last Vitals:  Vitals:   12/10/20 1232 12/10/20 1330  BP:  137/90  Pulse: (!) 52 (!) 50  Resp: 11 16  Temp:  36.6 C  SpO2: 100% 100%    Last Pain:  Vitals:   12/10/20 1330  TempSrc:   PainSc: 0-No pain                 Belenda Cruise P Shareef Eddinger

## 2020-12-14 ENCOUNTER — Encounter (HOSPITAL_BASED_OUTPATIENT_CLINIC_OR_DEPARTMENT_OTHER): Payer: Self-pay | Admitting: General Surgery

## 2020-12-15 ENCOUNTER — Other Ambulatory Visit: Payer: Self-pay | Admitting: Internal Medicine

## 2020-12-15 DIAGNOSIS — G2581 Restless legs syndrome: Secondary | ICD-10-CM

## 2021-01-06 ENCOUNTER — Encounter (HOSPITAL_COMMUNITY): Payer: Self-pay | Admitting: Psychiatry

## 2021-01-06 ENCOUNTER — Other Ambulatory Visit: Payer: Self-pay

## 2021-01-06 ENCOUNTER — Telehealth (INDEPENDENT_AMBULATORY_CARE_PROVIDER_SITE_OTHER): Payer: 59 | Admitting: Psychiatry

## 2021-01-06 DIAGNOSIS — F411 Generalized anxiety disorder: Secondary | ICD-10-CM | POA: Diagnosis not present

## 2021-01-06 DIAGNOSIS — F3341 Major depressive disorder, recurrent, in partial remission: Secondary | ICD-10-CM | POA: Diagnosis not present

## 2021-01-06 DIAGNOSIS — F3342 Major depressive disorder, recurrent, in full remission: Secondary | ICD-10-CM | POA: Diagnosis not present

## 2021-01-06 DIAGNOSIS — F5081 Binge eating disorder: Secondary | ICD-10-CM | POA: Diagnosis not present

## 2021-01-06 DIAGNOSIS — F322 Major depressive disorder, single episode, severe without psychotic features: Secondary | ICD-10-CM | POA: Insufficient documentation

## 2021-01-06 MED ORDER — LAMOTRIGINE 150 MG PO TABS
150.0000 mg | ORAL_TABLET | ORAL | 0 refills | Status: DC
Start: 2021-01-06 — End: 2021-03-04

## 2021-01-06 MED ORDER — PRISTIQ 100 MG PO TB24
100.0000 mg | ORAL_TABLET | ORAL | 1 refills | Status: DC
Start: 2021-01-06 — End: 2021-03-04

## 2021-01-06 MED ORDER — TRAZODONE HCL 100 MG PO TABS: 100.0000 mg | ORAL_TABLET | Freq: Every day | ORAL | 0 refills | Status: DC

## 2021-01-06 MED ORDER — TOPIRAMATE 100 MG PO TABS: 200.0000 mg | ORAL_TABLET | Freq: Every day | ORAL | 0 refills | Status: DC

## 2021-01-06 MED ORDER — PROPRANOLOL HCL 20 MG PO TABS: 20.0000 mg | ORAL_TABLET | Freq: Three times a day (TID) | ORAL | 0 refills | Status: DC | PRN

## 2021-01-06 NOTE — Progress Notes (Signed)
Toni Green    Virtual Visit via Video Green  I connected with Toni Green on 01/06/21 at  1:20 PM EDT by a video enabled telemedicine application and verified that I am speaking with the correct person using two identifiers.  Location: Patient: Home Provider: Clinic   I discussed the limitations of evaluation and management by telemedicine and the availability of in person appointments. The patient expressed understanding and agreed to proceed.  I provided 18 minutes of non-face-to-face time during this encounter.     01/06/2021 1:56 PM Toni Green  MRN:  [id number]  Chief Complaint:  " I am very stressed."  HPI: Patient reported that she has been under a lot of stress due to several different reasons.  She stated that her first and her sister's cancer came back.  She has been given not so good prognosis and has been told that she has about 2 years left.  She stated that her sister is taking it well and is not too bothered by this.  However it has been very hard for the patient.  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  She stated that unfortunately the procedure did not go too well and she is still having the same problems that she was before the procedure. She stated that she feels she is too young to be going through all this and was hoping that the surgery would be successful.  She will be returning back to see her surgeon in 2 months from now to see what the next recommendation would be. Regarding her mother, she stated that her mother is the same and continues to make things all about her.  She stated that her health is improved and she is now using a cane for ambulation.  Although she stopped her physical therapy on her own. She stated that she is happy that her mother's health is somewhat better now however she really wants her to behave more cooperatively now since her sister will be needing them a  lot. Patient became tearful while talking about her own health conditions and her sister. Writer provided her with much reassurance. She stated that sometimes she feels depressed and tearful but then other times she tries to keep her routine and give 100% to everything she does. She denied any suicidal ideations. She sleeps fairly well at night. She feels anxious at times because of the uncertainty of everything that is going on.  Visit Diagnosis:    ICD-10-CM   1. MDD (major depressive disorder), recurrent, in partial remission (HCC)  F33.41 lamoTRIgine (LAMICTAL) 150 MG tablet  2. Binge eating disorder  F50.81 topiramate (TOPAMAX) 100 MG tablet    PRISTIQ 100 MG 24 hr tablet  3. Anxiety state  F41.1 propranolol (INDERAL) 20 MG tablet  4. MDD (major depressive disorder), recurrent, in full remission ([place])  F33.42 PRISTIQ 100 MG 24 hr tablet    Past Psychiatric History: MDD,anxiety, Binge eating d/o  Past Medical History:  Past Medical History:  Diagnosis Date  . Anemia   . Anxiety   . Arthritis    fingers  . 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 Bursitis    hips  . Bursitis, trochanteric 10/01/2014  . 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 Claustrophobia   . Complication of surgery 06/23/6733   Overview:  Dilated pouch found on EGD done 12/2014   .  Depression   . History of kidney stones   . Hypertension   . Hypoglycemia   . Hypothyroidism   . Inflammation of sacroiliac joint ([place]) 08/22/2014  . Pneumonia 03/2020  . PONV (postoperative nausea and vomiting)    NASUEATED/BAD HEADACHES  . Seizures ([place]) 1985   x1, after labor/childbirth  . Thyroid disease   . Wears glasses     Past Surgical History:  Procedure Laterality Date  . ABDOMINAL HYSTERECTOMY  yrs ago   partial  . APPENDECTOMY  2010  . BACK SURGERY  10/2005   lumbar  . COLONOSCOPY WITH PROPOFOL N/A 09/23/2016   Procedure: COLONOSCOPY WITH PROPOFOL;  Surgeon: Lucilla Lame, MD;  Location:  [place];  Service: Endoscopy;  Laterality: N/A;  . CYSTOSCOPY     x 1  . EVALUATION UNDER ANESTHESIA WITH HEMORRHOIDECTOMY N/A 07/06/2020   Procedure: EXAM UNDER ANESTHESIA WITH HEMORRHOIDECTOMY;  Surgeon: Fredirick Maudlin, MD;  Location: [facility];  Service: General;  Laterality: N/A;  . GALLBLADDER SURGERY  2009   lap  . 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 MUCOSAL ADVANCEMENT FLAP N/A 12/10/2020   Procedure: MUCOSAL ADVANCEMENT FLAP;  Surgeon: Leighton Ruff, MD;  Location: [place];  Service: General;  Laterality: N/A;  60 MIN  . POLYPECTOMY  09/23/2016   Procedure: POLYPECTOMY;  Surgeon: Lucilla Lame, MD;  Location: [place];  Service: Endoscopy;;    Family Psychiatric History:see below  Family History:  Family History  Problem Relation Age of Onset  . Depression Mother   . Hypertension Mother   . Depression Father   . Hypertension Father   . Depression Sister   . Breast cancer Sister 35  . Hypertension Maternal Grandfather   . Depression Maternal Grandmother   . Hypertension Maternal Grandmother   . Hypertension Paternal Grandfather   . Hypertension Paternal Grandmother     Social History:  Social History   Socioeconomic History  . Marital status: Married    Spouse name: Not on file  . Number of children: Not on file  . Years of education: Not on file  . Highest education level: Not on file  Occupational History  . Not on file  Tobacco Use  . Smoking status: Never Smoker  . Smokeless tobacco: Never Used  Vaping Use  . Vaping Use: Never used  Substance and Sexual Activity  . Alcohol use: Yes    Alcohol/week: 0.0 standard drinks    Comment: RARE  . Drug use: No  . Sexual activity: Not Currently  Other Topics Concern  . Not on file  Social History Narrative  . Not on file   Social Determinants of Health   Financial Resource Strain: Not on file  Food Insecurity: Not on file  Transportation Needs: Not on file   Physical Activity: Not on file  Stress: Not on file  Social Connections: Not on file    Allergies:  Allergies  Allergen Reactions  . Losartan Palpitations    Could feel heartbeat (strongly) in neck  . Penicillins Rash  . Tape Rash    Skin irritation after back surgery white paper tape    Metabolic Disorder Labs: No results found for: HGBA1C, MPG No results found for: PROLACTIN Lab Results  Component Value Date   CHOL 221 (H) 01/06/2020   TRIG 61 01/06/2020   HDL 99 01/06/2020   CHOLHDL 2.2 01/06/2020   LDLCALC 111 (H) 01/06/2020   LDLCALC 120 (H) 11/30/2018  Lab Results  Component Value Date   TSH 1.030 01/06/2020   TSH 2.300 11/30/2018    Therapeutic Level Labs: No results found for: LITHIUM No results found for: VALPROATE No components found for:  CBMZ  Current Medications: Current Outpatient Medications  Medication Sig Dispense Refill  . Ascorbic Acid (VITAMIN C) 1000 MG tablet Take 1,000 mg by mouth at bedtime.    . cetirizine (ZYRTEC) 10 MG tablet Take 10 mg by mouth every morning.     . Cholecalciferol (VITAMIN D-3) 125 MCG (5000 UT) TABS Take 5,000 Units by mouth at bedtime.    . Cyanocobalamin (VITAMIN B-12) 2500 MCG SUBL Place 2,500 mcg under the tongue at bedtime.    . ferrous sulfate 325 (65 FE) MG tablet Take 325 mg by mouth at bedtime.    . fluticasone (FLONASE) 50 MCG/ACT nasal spray Place 2 sprays into both nostrils daily. (Patient taking differently: Place 2 sprays into both nostrils daily as needed (sinus issues/allergies.).) 16 g 6  . furosemide (LASIX) 20 MG tablet TAKE 1 TABLET DAILY (Patient taking differently: Take 20 mg by mouth daily as needed (fluid retention.).) 90 tablet 3  . ibuprofen (ADVIL) 800 MG tablet Take 1 tablet (800 mg total) by mouth every 8 (eight) hours as needed. 30 tablet 0  . irbesartan (AVAPRO) 300 MG tablet TAKE 1 TABLET DAILY (Patient taking differently: Take 150 mg by mouth at bedtime.) 90 tablet 3  . lamoTRIgine  (LAMICTAL) 150 MG tablet Take 1 tablet (150 mg total) by mouth every morning. 90 tablet 0  . levothyroxine (SYNTHROID) 75 MCG tablet TAKE 1 TABLET DAILY BEFORE BREAKFAST (Patient taking differently: Take 75 mcg by mouth at bedtime.) 90 tablet 3  . magnesium oxide (MAG-OX) 400 MG tablet Take 400 mg by mouth at bedtime.    Marland Kitchen oxyCODONE (OXY IR/ROXICODONE) 5 MG immediate release tablet Take 1 tablet (5 mg total) by mouth every 6 (six) hours as needed for severe pain. 30 tablet 0  . PRISTIQ 100 MG 24 hr tablet Take 1 tablet (100 mg total) by mouth every morning. 90 tablet 1  . propranolol (INDERAL) 20 MG tablet Take 1 tablet (20 mg total) by mouth 3 (three) times daily as needed (anxiety). 270 tablet 0  . rOPINIRole (REQUIP) 0.25 MG tablet TAKE 5 TABLETS AT BEDTIME 150 tablet 0  . tamsulosin (FLOMAX) 0.4 MG CAPS capsule Take 1 capsule (0.4 mg total) by mouth daily as needed. (Patient taking differently: Take 0.4 mg by mouth daily as needed (Kidney stones).) 30 capsule 2  . topiramate (TOPAMAX) 100 MG tablet Take 2 tablets (200 mg total) by mouth at bedtime. 90 tablet 0  . traMADol (ULTRAM) 50 MG tablet Take 25-50 mg by mouth 2 (two) times daily as needed (back pain.).     Marland Kitchen traZODone (DESYREL) 100 MG tablet Take 1 tablet (100 mg total) by mouth at bedtime. 90 tablet 0   No current facility-administered medications for this visit.      Psychiatric Specialty Exam: Review of Systems  There were no vitals taken for this visit.There is no height or weight on file to calculate BMI.  General Appearance: Well Groomed  Eye Contact:  Good  Speech:  Clear and Coherent and Normal Rate  Volume:  Normal  Mood:  Depressed  Affect:  Congruent and Tearful  Thought Process:  Goal Directed, Linear and Descriptions of Associations: Intact  Orientation:  Full (Time, Place, and Person)  Thought Content: Logical   Suicidal Thoughts:  No  Homicidal Thoughts:  No  Memory:  Recent;   Good Remote;   Good   Judgement:  Good  Insight:  Good  Psychomotor Activity:  Normal  Concentration:  Concentration: Good and Attention Span: Good  Recall:  Good  Fund of Knowledge: Good  Language: Good  Akathisia:  No  Handed:  Right  AIMS (if indicated): not done  Assets:  Communication Skills Desire for Improvement Financial Resources/Insurance Housing Social Support  ADL's:  Intact  Cognition: WNL  Sleep:  Good     Screenings: GAD-7   Flowsheet Row Office Visit from 04/29/2020 in [facility] Office Visit from 03/24/2020 in [facility] Office Visit from 01/06/2020 in [facility]  Total GAD-7 Score 0 0 15    PHQ2-9   Flowsheet Row Video Visit from 01/06/2021 in [facility] Office Visit from 04/29/2020 in [facility] Office Visit from 03/24/2020 in [facility] Office Visit from 01/06/2020 in [facility] Office Visit from 11/06/2019 in [facility]  PHQ-2 Total Score 2 0 0 1 0  PHQ-9 Total Score 7 0 0 1 0    Flowsheet Row Video Visit from 01/06/2021 in [facility] Admission (Discharged) from 12/10/2020 in [place] No Risk Error: Question 6 not populated       Assessment and Plan: Patient is dealing with several stressors including her own health conditions as well as her sister and mother's health issues.  She is trying hard to focus on her work and not get overwhelmed easily.   1. MDD (major depressive disorder), recurrent, in partial remission (HCC) - lamoTRIgine (LAMICTAL) 150 MG tablet; Take 1 tablet (150 mg total) by mouth every morning.  Dispense: 90 tablet; Refill: 0 -PRISTIQ 100 MG 24 hr tablet; Take 1 tablet (100 mg total) by mouth every morning.  Dispense: 90 tablet; Refill: 1   2. Binge eating disorder  -Topiramate (TOPAMAX) 100 MG tablet; Take 2 tablets (200 mg total) by mouth at bedtime.  Dispense: 90 tablet; Refill: 0 - PRISTIQ 100 MG 24  hr tablet; Take 1 tablet (100 mg total) by mouth every morning.  Dispense: 90 tablet; Refill: 1  3. Anxiety state  - propranolol (INDERAL) 20 MG tablet; Take 1 tablet (20 mg total) by mouth 3 (three) times daily as needed (anxiety).  Dispense: 270 tablet; Refill: 0   Continue same medication regimen for now. Follow-up in 2 months.  [place] Crane, MD 01/06/2021, 1:56 PM

## 2021-01-07 ENCOUNTER — Other Ambulatory Visit: Payer: Self-pay

## 2021-01-07 ENCOUNTER — Ambulatory Visit (INDEPENDENT_AMBULATORY_CARE_PROVIDER_SITE_OTHER): Payer: 59 | Admitting: Internal Medicine

## 2021-01-07 ENCOUNTER — Encounter: Payer: Self-pay | Admitting: Internal Medicine

## 2021-01-07 VITALS — BP 118/88 | HR 64 | Temp 98.5°F | Ht 70.0 in | Wt 169.0 lb

## 2021-01-07 DIAGNOSIS — E033 Postinfectious hypothyroidism: Secondary | ICD-10-CM | POA: Diagnosis not present

## 2021-01-07 DIAGNOSIS — G2581 Restless legs syndrome: Secondary | ICD-10-CM

## 2021-01-07 DIAGNOSIS — Z9884 Bariatric surgery status: Secondary | ICD-10-CM | POA: Diagnosis not present

## 2021-01-07 DIAGNOSIS — Z0000 Encounter for general adult medical examination without abnormal findings: Secondary | ICD-10-CM

## 2021-01-07 DIAGNOSIS — Z1231 Encounter for screening mammogram for malignant neoplasm of breast: Secondary | ICD-10-CM

## 2021-01-07 DIAGNOSIS — I10 Essential (primary) hypertension: Secondary | ICD-10-CM

## 2021-01-07 DIAGNOSIS — N823 Fistula of vagina to large intestine: Secondary | ICD-10-CM

## 2021-01-07 DIAGNOSIS — F3341 Major depressive disorder, recurrent, in partial remission: Secondary | ICD-10-CM

## 2021-01-07 MED ORDER — ROPINIROLE HCL 2 MG PO TABS
2.0000 mg | ORAL_TABLET | Freq: Every day | ORAL | 1 refills | Status: DC
Start: 2021-01-07 — End: 2021-07-16

## 2021-01-07 NOTE — Progress Notes (Signed)
Date:  01/07/2021   Name:  Toni Green   DOB:  [birth year]   MRN:  [id number]   Chief Complaint: Annual Exam (Breast exam no pap)  Toni Green is a 60 y.o. female who presents today for her Complete Annual Exam. She feels well. She reports exercising none. She reports she is sleeping fairly well. Breast complaints none. She is very depressed - mother had a stroke in December and her sister has a second breast cancer with a poor prognosis.  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  All of these issues have her very down with decreased appetite and energy.  Mammogram: 07/2019 DEXA: none Pap smear: discontinued Colonoscopy: 09/2016 due this year  Immunization History  Administered Date(s) Administered  . Influenza-Unspecified 08/01/2015, 08/17/2017  . Tdap 05/25/2016    Hypertension This is a chronic problem. The problem is controlled. Pertinent negatives include no chest pain, headaches, palpitations or shortness of breath. Past treatments include angiotensin blockers and diuretics. The current treatment provides significant improvement. There are no compliance problems.  Identifiable causes of hypertension include a thyroid problem.  Thyroid Problem Presents for follow-up visit. Symptoms include anxiety. Patient reports no constipation, diarrhea, fatigue, palpitations or tremors. The symptoms have been stable.  Depression        This is a chronic (followed closely by Psychiatry) problem.  The problem occurs daily.  Associated symptoms include no fatigue and no headaches.     The symptoms are aggravated by family issues (and health issues).  Past treatments include other medications and SNRIs - Serotonin and norepinephrine reuptake inhibitors.  Past medical history includes thyroid problem.     Lab Results  Component Value Date   CREATININE 0.80 12/10/2020   BUN 16 12/10/2020   NA 144 12/10/2020   K 3.9 12/10/2020    CL 107 12/10/2020   CO2 23 01/06/2020   Lab Results  Component Value Date   CHOL 221 (H) 01/06/2020   HDL 99 01/06/2020   LDLCALC 111 (H) 01/06/2020   TRIG 61 01/06/2020   CHOLHDL 2.2 01/06/2020   Lab Results  Component Value Date   TSH 1.030 01/06/2020   No results found for: HGBA1C Lab Results  Component Value Date   WBC 4.7 02/28/2020   HGB 13.9 12/10/2020   HCT 41.0 12/10/2020   MCV 94 02/28/2020   PLT 222 02/28/2020   Lab Results  Component Value Date   ALT 30 04/06/2020   AST 23 04/06/2020   ALKPHOS 184 (H) 04/06/2020   BILITOT 0.3 04/06/2020     Review of Systems  Constitutional: Negative for chills, fatigue and fever.  HENT: Negative for congestion, hearing loss, tinnitus, trouble swallowing and voice change.   Eyes: Negative for visual disturbance.  Respiratory: Negative for cough, chest tightness, shortness of breath and wheezing.   Cardiovascular: Negative for chest pain, palpitations and leg swelling.  Gastrointestinal: Positive for blood in stool and rectal pain. Negative for abdominal pain, constipation, diarrhea and vomiting.  Endocrine: Negative for polydipsia and polyuria.  Genitourinary: Positive for vaginal discharge (from fistula). Negative for dysuria, frequency and genital sores.  Musculoskeletal: Negative for arthralgias, gait problem and joint swelling.  Skin: Negative for color change and rash.  Neurological: Negative for dizziness, tremors, light-headedness and headaches.  Hematological: Negative for adenopathy. Does not bruise/bleed easily.  Psychiatric/Behavioral: Positive for depression and dysphoric mood. Negative for sleep disturbance. The patient is nervous/anxious.  Patient Active Problem List   Diagnosis Date Noted  . MDD (major depressive disorder), recurrent, in partial remission ([place]) 01/06/2021  . Status post hysterectomy 11/25/2020  . Hemorrhoids   . Anxiety state 10/14/2019  . Binge eating disorder 10/14/2019  .  Restless leg syndrome 11/30/2018  . Bright red rectal bleeding 11/30/2018  . S/P gastric bypass 08/07/2017  . Special screening for malignant neoplasms, colon   . Benign neoplasm of ascending colon   . Polyp of sigmoid colon   . Menopause syndrome 07/26/2016  . 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 Essential (primary) hypertension 07/20/2015  . 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 disorder 07/20/2015  . 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 DDD (degenerative disc disease), cervical 03/17/2014  . Hyperoxaluria 08/15/2013  . Calculus of kidney 02/13/2013    Allergies  Allergen Reactions  . Losartan Palpitations    Could feel heartbeat (strongly) in neck  . Penicillins Rash  . Tape Rash    Skin irritation after back surgery white paper tape    Past Surgical History:  Procedure Laterality Date  . ABDOMINAL HYSTERECTOMY  yrs ago   partial  . APPENDECTOMY  2010  . BACK SURGERY  10/2005   lumbar  . COLONOSCOPY WITH PROPOFOL N/A 09/23/2016   Procedure: COLONOSCOPY WITH PROPOFOL;  Surgeon: Lucilla Lame, MD;  Location: [place];  Service: Endoscopy;  Laterality: N/A;  . CYSTOSCOPY     x 1  . EVALUATION UNDER ANESTHESIA WITH HEMORRHOIDECTOMY N/A 07/06/2020   Procedure: EXAM UNDER ANESTHESIA WITH HEMORRHOIDECTOMY;  Surgeon: Fredirick Maudlin, MD;  Location: [facility];  Service: General;  Laterality: N/A;  . GALLBLADDER SURGERY  2009   lap  . 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 MUCOSAL ADVANCEMENT FLAP N/A 12/10/2020   Procedure: MUCOSAL ADVANCEMENT FLAP;  Surgeon: Leighton Ruff, MD;  Location: [place];  Service: General;  Laterality: N/A;  60 MIN  . POLYPECTOMY  09/23/2016   Procedure: POLYPECTOMY;  Surgeon: Lucilla Lame, MD;  Location: [place];  Service: Endoscopy;;    Social History   Tobacco Use  . Smoking status: Never Smoker  .  Smokeless tobacco: Never Used  Vaping Use  . Vaping Use: Never used  Substance Use Topics  . Alcohol use: Yes    Alcohol/week: 0.0 standard drinks    Comment: RARE  . Drug use: No     Medication list has been reviewed and updated.  Current Meds  Medication Sig  . Ascorbic Acid (VITAMIN C) 1000 MG tablet Take 1,000 mg by mouth at bedtime.  . cetirizine (ZYRTEC) 10 MG tablet Take 10 mg by mouth as needed.  . Cholecalciferol (VITAMIN D-3) 125 MCG (5000 UT) TABS Take 5,000 Units by mouth at bedtime.  . Cyanocobalamin (VITAMIN B-12) 2500 MCG SUBL Place 2,500 mcg under the tongue at bedtime.  . ferrous sulfate 325 (65 FE) MG tablet Take 325 mg by mouth at bedtime.  . fluticasone (FLONASE) 50 MCG/ACT nasal spray Place 2 sprays into both nostrils daily. (Patient taking differently: Place 2 sprays into both nostrils daily as needed (sinus issues/allergies.).)  . furosemide (LASIX) 20 MG tablet TAKE 1 TABLET DAILY (Patient taking differently: Take 20 mg by mouth daily as needed (fluid retention.).)  . ibuprofen (ADVIL) 800 MG tablet Take 1 tablet (800 mg total) by mouth every 8 (eight) hours as needed.  . irbesartan (AVAPRO) 300  MG tablet TAKE 1 TABLET DAILY (Patient taking differently: Take 150 mg by mouth at bedtime.)  . lamoTRIgine (LAMICTAL) 150 MG tablet Take 1 tablet (150 mg total) by mouth every morning.  Marland Kitchen levothyroxine (SYNTHROID) 75 MCG tablet TAKE 1 TABLET DAILY BEFORE BREAKFAST (Patient taking differently: Take 75 mcg by mouth at bedtime.)  . magnesium oxide (MAG-OX) 400 MG tablet Take 400 mg by mouth at bedtime.  Marland Kitchen PRISTIQ 100 MG 24 hr tablet Take 1 tablet (100 mg total) by mouth every morning.  . propranolol (INDERAL) 20 MG tablet Take 1 tablet (20 mg total) by mouth 3 (three) times daily as needed (anxiety).  Marland Kitchen rOPINIRole (REQUIP) 0.25 MG tablet TAKE 5 TABLETS AT BEDTIME  . tamsulosin (FLOMAX) 0.4 MG CAPS capsule Take 1 capsule (0.4 mg total) by mouth daily as needed. (Patient  taking differently: Take 0.4 mg by mouth daily as needed (Kidney stones).)  . topiramate (TOPAMAX) 100 MG tablet Take 2 tablets (200 mg total) by mouth at bedtime.  . traMADol (ULTRAM) 50 MG tablet Take 25-50 mg by mouth 2 (two) times daily as needed (back pain.).   Marland Kitchen traZODone (DESYREL) 100 MG tablet Take 1 tablet (100 mg total) by mouth at bedtime.  . valACYclovir (VALTREX) 1000 MG tablet as needed.    PHQ 2/9 Scores 01/07/2021 04/29/2020 03/24/2020 01/06/2020  PHQ - 2 Score 2 0 0 1  PHQ- 9 Score 5 0 0 1  Some encounter information is confidential and restricted. Go to Review Flowsheets activity to see all data.    GAD 7 : Generalized Anxiety Score 01/07/2021 04/29/2020 03/24/2020 01/06/2020  Nervous, Anxious, on Edge 1 0 0 0  Control/stop worrying 1 0 0 3  Worry too much - different things 1 0 0 3  Trouble relaxing 0 0 0 3  Restless 0 0 0 3  Easily annoyed or irritable 0 0 0 3  Afraid - awful might happen 0 0 0 0  Total GAD 7 Score 3 0 0 15  Anxiety Difficulty - Not difficult at all Not difficult at all Somewhat difficult    BP Readings from Last 3 Encounters:  01/07/21 118/90  12/10/20 137/90  07/30/20 (!) 151/108    Physical Exam Vitals and nursing note reviewed.  Constitutional:      General: She is not in acute distress.    Appearance: She is well-developed.  HENT:     Head: Normocephalic and atraumatic.     Right Ear: Tympanic membrane and ear canal normal.     Left Ear: Tympanic membrane and ear canal normal.     Nose:     Right Sinus: No maxillary sinus tenderness.     Left Sinus: No maxillary sinus tenderness.  Eyes:     General: No scleral icterus.       Right eye: No discharge.        Left eye: No discharge.     Conjunctiva/sclera: Conjunctivae normal.  Neck:     Thyroid: No thyromegaly.     Vascular: No carotid bruit.  Cardiovascular:     Rate and Rhythm: Normal rate and regular rhythm.     Pulses: Normal pulses.     Heart sounds: Normal heart sounds.   Pulmonary:     Effort: Pulmonary effort is normal. No respiratory distress.     Breath sounds: No wheezing.  Chest:  Breasts:     Right: No mass, nipple discharge, skin change or tenderness.     Left: No mass,  nipple discharge, skin change or tenderness.    Abdominal:     General: Bowel sounds are normal.     Palpations: Abdomen is soft.     Tenderness: There is no abdominal tenderness.  Musculoskeletal:     Cervical back: Normal range of motion. No erythema.     Right lower leg: No edema.     Left lower leg: No edema.  Lymphadenopathy:     Cervical: No cervical adenopathy.  Skin:    General: Skin is warm and dry.     Findings: No rash.  Neurological:     Mental Status: She is alert and oriented to person, place, and time.     Cranial Nerves: No cranial nerve deficit.     Sensory: No sensory deficit.     Deep Tendon Reflexes: Reflexes are normal and symmetric.  Psychiatric:        Attention and Perception: Attention normal.        Mood and Affect: Mood is depressed. Affect is tearful.     Wt Readings from Last 3 Encounters:  01/07/21 169 lb (76.7 kg)  12/10/20 173 lb 11.2 oz (78.8 kg)  07/30/20 176 lb 9.6 oz (80.1 kg)    BP 118/90   Pulse 64   Temp 98.5 F (36.9 C) (Oral)   Ht 5\' 10"  (1.778 m)   Wt 169 lb (76.7 kg)   SpO2 98%   BMI 24.25 kg/m   Assessment and Plan: 1. Annual physical exam Normal exam except for mood - Lipid panel  2. Encounter for screening mammogram for breast cancer Schedule at [facility]  3. Essential (primary) hypertension Clinically stable exam with well controlled BP. Tolerating medications without side effects at this time. Pt to continue current regimen and low sodium diet; benefits of regular exercise as able discussed. - CBC with Differential/Platelet - Comprehensive metabolic panel  4. Post-infectious hypothyroidism Supplemented; check labs - TSH + free T4  5. MDD (major depressive disorder), recurrent, in partial remission  ([place]) Followed by psych - no SI/HI but worsening symptoms due to family and personal issues Continue current medication and close psych follow up  6. Restless leg syndrome Doing well on 1.25 mg but tired of taking 5 pills - will consolidate to one 2 mg tab - rOPINIRole (REQUIP) 2 MG tablet; Take 1 tablet (2 mg total) by mouth at bedtime.  Dispense: 90 tablet; Refill: 1  7. S/P gastric bypass Check labs for vitamin and iron levels - Vitamin B12 - VITAMIN D 25 Hydroxy (Vit-D Deficiency, Fractures) - Iron, TIBC and Ferritin Panel  8. 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   Partially dictated using Editor, commissioning. Any errors are unintentional.  Halina Maidens, MD [facility]  01/07/2021

## 2021-01-08 LAB — COMPREHENSIVE METABOLIC PANEL
ALT: 43 IU/L — ABNORMAL HIGH (ref 0–32)
AST: 30 IU/L (ref 0–40)
Albumin/Globulin Ratio: 2.4 — ABNORMAL HIGH (ref 1.2–2.2)
Albumin: 4.5 g/dL (ref 3.8–4.9)
Alkaline Phosphatase: 227 IU/L — ABNORMAL HIGH (ref 44–121)
BUN/Creatinine Ratio: 10 (ref 9–23)
BUN: 8 mg/dL (ref 6–24)
Bilirubin Total: 0.2 mg/dL (ref 0.0–1.2)
CO2: 21 mmol/L (ref 20–29)
Calcium: 9.8 mg/dL (ref 8.7–10.2)
Chloride: 104 mmol/L (ref 96–106)
Creatinine, Ser: 0.84 mg/dL (ref 0.57–1.00)
Globulin, Total: 1.9 g/dL (ref 1.5–4.5)
Glucose: 83 mg/dL (ref 65–99)
Potassium: 3.8 mmol/L (ref 3.5–5.2)
Sodium: 141 mmol/L (ref 134–144)
Total Protein: 6.4 g/dL (ref 6.0–8.5)
eGFR: 80 mL/min/{1.73_m2} (ref >59)

## 2021-01-08 LAB — CBC WITH DIFFERENTIAL/PLATELET
Basophils Absolute: 0 10*3/uL (ref 0.0–0.2)
Basos: 0 %
EOS (ABSOLUTE): 0 10*3/uL (ref 0.0–0.4)
Eos: 0 %
Hematocrit: 41.1 % (ref 34.0–46.6)
Hemoglobin: 13.6 g/dL (ref 11.1–15.9)
Immature Grans (Abs): 0 10*3/uL (ref 0.0–0.1)
Immature Granulocytes: 0 %
Lymphocytes Absolute: 2.1 10*3/uL (ref 0.7–3.1)
Lymphs: 20 %
MCH: 30.8 pg (ref 26.6–33.0)
MCHC: 33.1 g/dL (ref 31.5–35.7)
MCV: 93 fL (ref 79–97)
Monocytes Absolute: 0.8 10*3/uL (ref 0.1–0.9)
Monocytes: 8 %
Neutrophils Absolute: 7.4 10*3/uL — ABNORMAL HIGH (ref 1.4–7.0)
Neutrophils: 72 %
Platelets: 332 10*3/uL (ref 150–450)
RBC: 4.41 x10E6/uL (ref 3.77–5.28)
RDW: 11.8 % (ref 11.7–15.4)
WBC: 10.4 10*3/uL (ref 3.4–10.8)

## 2021-01-08 LAB — TSH+FREE T4
Free T4: 1.38 ng/dL (ref 0.82–1.77)
TSH: 1.36 u[IU]/mL (ref 0.450–4.500)

## 2021-01-08 LAB — IRON,TIBC AND FERRITIN PANEL
Ferritin: 167 ng/mL — ABNORMAL HIGH (ref 15–150)
Iron Saturation: 32 % (ref 15–55)
Iron: 92 ug/dL (ref 27–159)
Total Iron Binding Capacity: 291 ug/dL (ref 250–450)
UIBC: 199 ug/dL (ref 131–425)

## 2021-01-08 LAB — LIPID PANEL
Chol/HDL Ratio: 2.4 ratio (ref 0.0–4.4)
Cholesterol, Total: 232 mg/dL — ABNORMAL HIGH (ref 100–199)
HDL: 97 mg/dL (ref >39)
LDL Chol Calc (NIH): 126 mg/dL — ABNORMAL HIGH (ref 0–99)
Triglycerides: 53 mg/dL (ref 0–149)
VLDL Cholesterol Cal: 9 mg/dL (ref 5–40)

## 2021-01-08 LAB — VITAMIN D 25 HYDROXY (VIT D DEFICIENCY, FRACTURES): Vit D, 25-Hydroxy: 39.3 ng/mL (ref 30.0–100.0)

## 2021-01-08 LAB — VITAMIN B12: Vitamin B-12: >2000 pg/mL — ABNORMAL HIGH (ref 232–1245)

## 2021-01-18 ENCOUNTER — Other Ambulatory Visit: Payer: Self-pay | Admitting: Internal Medicine

## 2021-01-18 DIAGNOSIS — Z1231 Encounter for screening mammogram for malignant neoplasm of breast: Secondary | ICD-10-CM

## 2021-01-21 ENCOUNTER — Other Ambulatory Visit: Payer: Self-pay

## 2021-01-21 ENCOUNTER — Ambulatory Visit
Admission: RE | Admit: 2021-01-21 | Discharge: 2021-01-21 | Disposition: A | Discharge status: Home / Self Care | Payer: 59 | Source: Ambulatory Visit | Attending: Internal Medicine | Admitting: Internal Medicine

## 2021-01-21 DIAGNOSIS — Z1231 Encounter for screening mammogram for malignant neoplasm of breast: Secondary | ICD-10-CM | POA: Diagnosis not present

## 2021-02-01 ENCOUNTER — Telehealth: Payer: Self-pay

## 2021-02-01 NOTE — Telephone Encounter (Signed)
Copied from [place] [id number]. Topic: Complaint - Billing/Coding >> Feb 01, 2021 11:05 AM Celene Kras wrote: DOS: 3.24.22 Details of complaint: PT states that she is being billed for a copay. She states that on this day she had physical. Please advise. How would the patient like to see this issue resolved? Pt states that she would like to have this bill resubmitted so that she is not charged. Please advise.   Route to Engineer, building services.

## 2021-02-23 ENCOUNTER — Telehealth (HOSPITAL_COMMUNITY): Payer: Self-pay | Admitting: *Deleted

## 2021-02-23 NOTE — Telephone Encounter (Signed)
Submitted a prior authorization for patients Pristiq ER 100 mg thru Cover my Meds. Waiting on a  Determination.

## 2021-02-24 ENCOUNTER — Other Ambulatory Visit (HOSPITAL_COMMUNITY): Payer: Self-pay | Admitting: Psychiatry

## 2021-02-24 ENCOUNTER — Other Ambulatory Visit: Payer: Self-pay | Admitting: Internal Medicine

## 2021-02-24 DIAGNOSIS — I10 Essential (primary) hypertension: Secondary | ICD-10-CM

## 2021-02-24 DIAGNOSIS — F5081 Binge eating disorder: Secondary | ICD-10-CM

## 2021-02-24 DIAGNOSIS — E033 Postinfectious hypothyroidism: Secondary | ICD-10-CM

## 2021-02-24 NOTE — Telephone Encounter (Signed)
Requested Prescriptions  Pending Prescriptions Disp Refills  . levothyroxine (SYNTHROID) 75 MCG tablet [Pharmacy Med Name: L-THYROXINE (SYNTHROID) TABS 75MCG] 90 tablet 3    Sig: TAKE 1 TABLET DAILY BEFORE BREAKFAST     Endocrinology:  Hypothyroid Agents Failed - 02/24/2021  2:55 PM      Failed - TSH needs to be rechecked within 3 months after an abnormal result. Refill until TSH is due.      Passed - TSH in normal range and within 360 days    TSH  Date Value Ref Range Status  01/07/2021 1.360 0.450 - 4.500 uIU/mL Final         Passed - Valid encounter within last 12 months    Recent Outpatient Visits          1 month ago Annual physical exam   [facility], [facility]   8 months ago Essential (primary) hypertension   [facility] Glean Hess, MD   10 months ago Parainfluenza virus pneumonia   [facility] Glean Hess, MD   11 months ago Acute non-recurrent maxillary sinusitis   [facility] Glean Hess, MD   1 year ago Annual physical exam   [facility] Glean Hess, MD             . irbesartan (AVAPRO) 300 MG tablet [Pharmacy Med Name: IRBESARTAN TABS 300MG ] 90 tablet 3    Sig: TAKE 1 TABLET DAILY     Cardiovascular:  Angiotensin Receptor Blockers Passed - 02/24/2021  2:55 PM      Passed - Cr in normal range and within 180 days    Creatinine  Date Value Ref Range Status  01/28/2013 0.71 0.60 - 1.30 mg/dL Final   Creatinine, Ser  Date Value Ref Range Status  01/07/2021 0.84 0.57 - 1.00 mg/dL Final         Passed - K in normal range and within 180 days    Potassium  Date Value Ref Range Status  01/07/2021 3.8 3.5 - 5.2 mmol/L Final  01/28/2013 3.6 3.5 - 5.1 mmol/L Final         Passed - Patient is not pregnant      Passed - Last BP in normal range    BP Readings from Last 1 Encounters:  01/07/21 118/88         Passed - Valid encounter within last 6 months    Recent Outpatient  Visits          1 month ago Annual physical exam   [facility] Glean Hess, MD   8 months ago Essential (primary) hypertension   [facility] Glean Hess, MD   10 months ago Parainfluenza virus pneumonia   [place], [place]   11 months ago Acute non-recurrent maxillary sinusitis   [facility] Glean Hess, MD   1 year ago Annual physical exam   [facility] Glean Hess, MD

## 2021-03-04 ENCOUNTER — Encounter (HOSPITAL_COMMUNITY): Payer: Self-pay | Admitting: Psychiatry

## 2021-03-04 ENCOUNTER — Other Ambulatory Visit: Payer: Self-pay

## 2021-03-04 ENCOUNTER — Telehealth (INDEPENDENT_AMBULATORY_CARE_PROVIDER_SITE_OTHER): Payer: 59 | Admitting: Psychiatry

## 2021-03-04 DIAGNOSIS — F5081 Binge eating disorder: Secondary | ICD-10-CM

## 2021-03-04 DIAGNOSIS — F3341 Major depressive disorder, recurrent, in partial remission: Secondary | ICD-10-CM | POA: Diagnosis not present

## 2021-03-04 DIAGNOSIS — F411 Generalized anxiety disorder: Secondary | ICD-10-CM | POA: Diagnosis not present

## 2021-03-04 MED ORDER — TRAZODONE HCL 100 MG PO TABS: 100.0000 mg | ORAL_TABLET | Freq: Every day | ORAL | 0 refills | Status: DC

## 2021-03-04 MED ORDER — LAMOTRIGINE 150 MG PO TABS: 150.0000 mg | ORAL_TABLET | ORAL | 0 refills | Status: DC

## 2021-03-04 MED ORDER — PRISTIQ 100 MG PO TB24: 100.0000 mg | ORAL_TABLET | ORAL | 0 refills | Status: DC

## 2021-03-04 NOTE — Progress Notes (Signed)
[facility] MD OP Progress Note   Virtual Visit via Video Note  I connected with Toni Green on 03/04/21 at  2:30 PM EDT by a video enabled telemedicine application and verified that I am speaking with the correct person using two identifiers.  Location: Patient: Home Provider: Clinic   I discussed the limitations of evaluation and management by telemedicine and the availability of in person appointments. The patient expressed understanding and agreed to proceed.  I provided 17 minutes of non-face-to-face time during this encounter.    03/04/2021 2:51 PM Toni Green  MRN:  [id number]  Chief Complaint:  "I am worried about my Pristiq."  HPI: Patient stated that she is really concerned about her brand-name Pristiq prescription because the insurance company is not allowing to approve it.  She stated that she is aware that the office staff has filled out appeals and she knows that we are working on it with received from our end.  She stated that she is praying that the Pristiq is covered because it is very expensive.  She stated that she has taken the brand-name Pristiq for the last few years because the generic Pristiq did not work for her.  She also stated that her sister and son also take brand-name Pristiq and are doing really well on it. She stated that she has been really busy because of her mother who was recently discharged from the hospital yesterday.  She stated that her sister is dealing with her own health conditions namely cancer relapse. She stated that she was with her mother for the past few days and now is on her way back home and she has brought her mother with her to stay with her and her husband. She stated that she and her mother do not get along and she is hoping that since she is coming to her house maybe things will not be that bad.  Patient then stated that she heard the writer is leaving the clinic and spent most of the session talking about that and how she wants to  continue to be under the care of the Probation officer.  Writer informed her that her care is being transferred to a different provider and [facility].   Visit Diagnosis:    ICD-10-CM   1. MDD (major depressive disorder), recurrent, in partial remission (HCC)  F33.41 traZODone (DESYREL) 100 MG tablet    lamoTRIgine (LAMICTAL) 150 MG tablet  2. Binge eating disorder  F50.81 PRISTIQ 100 MG 24 hr tablet  3. Anxiety state  F41.1     Past Psychiatric History: MDD,anxiety, Binge eating d/o  Past Medical History:  Past Medical History:  Diagnosis Date  . Anemia   . Anxiety   . Arthritis    fingers  . 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 Bursitis    hips  . Bursitis, trochanteric 10/01/2014  . 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 Claustrophobia   . Complication of surgery 06/19/7168   Overview:  Dilated pouch found on EGD done 12/2014   . Depression   . History of kidney stones   . Hypertension   . Hypoglycemia   . Hypothyroidism   . Inflammation of sacroiliac joint ([place]) 08/22/2014  . Pneumonia 03/2020  . PONV (postoperative nausea and vomiting)    NASUEATED/BAD HEADACHES  . Seizures ([place]) 1985   x1, after labor/childbirth  . Thyroid disease   . Wears glasses  Past Surgical History:  Procedure Laterality Date  . ABDOMINAL HYSTERECTOMY  yrs ago   partial  . APPENDECTOMY  2010  . BACK SURGERY  10/2005   lumbar  . COLONOSCOPY WITH PROPOFOL N/A 09/23/2016   Procedure: COLONOSCOPY WITH PROPOFOL;  Surgeon: Lucilla Lame, MD;  Location: [facility];  Service: Endoscopy;  Laterality: N/A;  . CYSTOSCOPY     x 1  . EVALUATION UNDER ANESTHESIA WITH HEMORRHOIDECTOMY N/A 07/06/2020   Procedure: EXAM UNDER ANESTHESIA WITH HEMORRHOIDECTOMY;  Surgeon: Fredirick Maudlin, MD;  Location: [facility];  Service: General;  Laterality: N/A;  . GALLBLADDER SURGERY  2009   lap  . 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 MUCOSAL ADVANCEMENT FLAP N/A  12/10/2020   Procedure: MUCOSAL ADVANCEMENT FLAP;  Surgeon: Leighton Ruff, MD;  Location: [facility];  Service: General;  Laterality: N/A;  60 MIN  . POLYPECTOMY  09/23/2016   Procedure: POLYPECTOMY;  Surgeon: Lucilla Lame, MD;  Location: [facility];  Service: Endoscopy;;    Family Psychiatric History:see below  Family History:  Family History  Problem Relation Age of Onset  . Depression Mother   . Hypertension Mother   . Depression Father   . Hypertension Father   . Depression Sister   . Breast cancer Sister 68  . Hypertension Maternal Grandfather   . Depression Maternal Grandmother   . Hypertension Maternal Grandmother   . Hypertension Paternal Grandfather   . Hypertension Paternal Grandmother     Social History:  Social History   Socioeconomic History  . Marital status: Married    Spouse name: Not on file  . Number of children: Not on file  . Years of education: Not on file  . Highest education level: Not on file  Occupational History  . Not on file  Tobacco Use  . Smoking status: Never Smoker  . Smokeless tobacco: Never Used  Vaping Use  . Vaping Use: Never used  Substance and Sexual Activity  . Alcohol use: Yes    Alcohol/week: 0.0 standard drinks    Comment: RARE  . Drug use: No  . Sexual activity: Not Currently  Other Topics Concern  . Not on file  Social History Narrative  . Not on file   Social Determinants of Health   Financial Resource Strain: Not on file  Food Insecurity: Not on file  Transportation Needs: Not on file  Physical Activity: Not on file  Stress: Not on file  Social Connections: Not on file    Allergies:  Allergies  Allergen Reactions  . Losartan Palpitations    Could feel heartbeat (strongly) in neck  . Penicillins Rash  . Tape Rash    Skin irritation after back surgery white paper tape    Metabolic Disorder Labs: No results found for: HGBA1C, MPG No results found for: PROLACTIN Lab Results   Component Value Date   CHOL 232 (H) 01/07/2021   TRIG 53 01/07/2021   HDL 97 01/07/2021   CHOLHDL 2.4 01/07/2021   LDLCALC 126 (H) 01/07/2021   LDLCALC 111 (H) 01/06/2020   Lab Results  Component Value Date   TSH 1.360 01/07/2021   TSH 1.030 01/06/2020    Therapeutic Level Labs: No results found for: LITHIUM No results found for: VALPROATE No components found for:  CBMZ  Current Medications: Current Outpatient Medications  Medication Sig Dispense Refill  . Ascorbic Acid (VITAMIN C) 1000 MG tablet Take 1,000 mg by mouth at bedtime.    Marland Kitchen  calcium gluconate 500 MG tablet Take 1 tablet by mouth daily.    . cetirizine (ZYRTEC) 10 MG tablet Take 10 mg by mouth as needed.    . Cholecalciferol (VITAMIN D-3) 125 MCG (5000 UT) TABS Take 5,000 Units by mouth at bedtime.    . Cyanocobalamin (VITAMIN B-12) 2500 MCG SUBL Place 2,500 mcg under the tongue at bedtime.    . ferrous sulfate 325 (65 FE) MG tablet Take 325 mg by mouth at bedtime.    . fluticasone (FLONASE) 50 MCG/ACT nasal spray Place 2 sprays into both nostrils daily. (Patient taking differently: Place 2 sprays into both nostrils daily as needed (sinus issues/allergies.).) 16 g 6  . furosemide (LASIX) 20 MG tablet TAKE 1 TABLET DAILY (Patient taking differently: Take 20 mg by mouth daily as needed (fluid retention.).) 90 tablet 3  . ibuprofen (ADVIL) 800 MG tablet Take 1 tablet (800 mg total) by mouth every 8 (eight) hours as needed. 30 tablet 0  . irbesartan (AVAPRO) 300 MG tablet TAKE 1 TABLET DAILY 90 tablet 3  . lamoTRIgine (LAMICTAL) 150 MG tablet Take 1 tablet (150 mg total) by mouth every morning. 90 tablet 0  . levothyroxine (SYNTHROID) 75 MCG tablet TAKE 1 TABLET DAILY BEFORE BREAKFAST 90 tablet 3  . magnesium oxide (MAG-OX) 400 MG tablet Take 400 mg by mouth at bedtime.    Marland Kitchen PRISTIQ 100 MG 24 hr tablet Take 1 tablet (100 mg total) by mouth every morning. 10 tablet 0  . propranolol (INDERAL) 20 MG tablet Take 1 tablet (20  mg total) by mouth 3 (three) times daily as needed (anxiety). 270 tablet 0  . rOPINIRole (REQUIP) 2 MG tablet Take 1 tablet (2 mg total) by mouth at bedtime. 90 tablet 1  . tamsulosin (FLOMAX) 0.4 MG CAPS capsule Take 1 capsule (0.4 mg total) by mouth daily as needed. (Patient taking differently: Take 0.4 mg by mouth daily as needed (Kidney stones).) 30 capsule 2  . topiramate (TOPAMAX) 100 MG tablet TAKE 2 TABLETS AT BEDTIME 90 tablet 7  . traMADol (ULTRAM) 50 MG tablet Take 25-50 mg by mouth 2 (two) times daily as needed (back pain.).     Marland Kitchen traZODone (DESYREL) 100 MG tablet Take 1 tablet (100 mg total) by mouth at bedtime. 90 tablet 0  . valACYclovir (VALTREX) 1000 MG tablet as needed.     No current facility-administered medications for this visit.      Psychiatric Specialty Exam: Review of Systems  There were no vitals taken for this visit.There is no height or weight on file to calculate BMI.  General Appearance: Well Groomed  Eye Contact:  Good  Speech:  Clear and Coherent and Normal Rate  Volume:  Normal  Mood:  Anxious  Affect:  Congruent  Thought Process:  Goal Directed, Linear and Descriptions of Associations: Intact  Orientation:  Full (Time, Place, and Person)  Thought Content: Logical   Suicidal Thoughts:  No  Homicidal Thoughts:  No  Memory:  Recent;   Good Remote;   Good  Judgement:  Good  Insight:  Good  Psychomotor Activity:  Normal  Concentration:  Concentration: Good and Attention Span: Good  Recall:  Good  Fund of Knowledge: Good  Language: Good  Akathisia:  No  Handed:  Right  AIMS (if indicated): not done  Assets:  Communication Skills Desire for Improvement Financial Resources/Insurance Housing Social Support  ADL's:  Intact  Cognition: WNL  Sleep:  Good     Screenings: GAD-7  [place] Visit from 01/07/2021 in [facility] Office Visit from 04/29/2020 in [facility] Office Visit from 03/24/2020 in [facility] Office Visit from 01/06/2020 in [facility]  Total GAD-7 Score 3 0 0 15    PHQ2-9   [place] Office Visit from 01/07/2021 in [facility] Video Visit from 01/06/2021 in [facility] Office Visit from 04/29/2020 in [facility] Office Visit from 03/24/2020 in [facility] Office Visit from 01/06/2020 in [facility]  PHQ-2 Total Score 2 2 0 0 1  PHQ-9 Total Score 5 7 0 0 1    Flowsheet Row Video Visit from 01/06/2021 in [facility] Admission (Discharged) from 12/10/2020 in [facility] No Risk Error: Question 6 not populated       Assessment and Plan: Patient is currently anxious and stressed because of insurance company not willing to approve her brand-name Pristiq prescription.  Patient really wants that approval to come in soon as possible as she only has 3 tablets of Pristiq left.  1. MDD (major depressive disorder), recurrent, in partial remission (HCC) - lamoTRIgine (LAMICTAL) 150 MG tablet; Take 1 tablet (150 mg total) by mouth every morning.  Dispense: 90 tablet; Refill: 0 -PRISTIQ 100 MG 24 hr tablet; Take 1 tablet (100 mg total) by mouth every morning.  Dispense: 90 tablet; Refill: 1   2. Binge eating disorder  -Topiramate (TOPAMAX) 100 MG tablet; Take 2 tablets (200 mg total) by mouth at bedtime.  Dispense: 90 tablet; Refill: 0 - PRISTIQ 100 MG 24 hr tablet; Take 1 tablet (100 mg total) by mouth every morning.  Dispense: 90 tablet; Refill: 1  3. Anxiety state  - propranolol (INDERAL) 20 MG tablet; Take 1 tablet (20 mg total) by mouth 3 (three) times daily as needed (anxiety).  Dispense: 270 tablet; Refill: 0  Writer informed the patient that office has filed appeals to her insurance company so that brand-name Pristiq can be filled however since she only has 3 tablets of state left writer informed her that Probation officer is sending 10 tablets prescription to her  local [place] so that she can pay out-of-pocket without having to worry about going through withdrawals.  Patient stated that although that will be expensive if she is willing to fill a 10-day supply as she does not want to run out.  Continue same medication regimen for now. Follow-up in 2 months.  Patient was made aware that her care is being transferred to [facility] psychiatry clinic psychiatry provider.  [place] Crane, MD 03/04/2021, 2:51 PM

## 2021-03-10 ENCOUNTER — Other Ambulatory Visit (HOSPITAL_COMMUNITY): Payer: Self-pay | Admitting: Psychiatry

## 2021-03-10 ENCOUNTER — Telehealth: Payer: Self-pay

## 2021-03-10 DIAGNOSIS — F5081 Binge eating disorder: Secondary | ICD-10-CM

## 2021-03-10 MED ORDER — PRISTIQ 100 MG PO TB24: 100.0000 mg | ORAL_TABLET | Freq: Every morning | ORAL | 0 refills | Status: DC

## 2021-03-10 MED ORDER — PRISTIQ 100 MG PO TB24: 100.0000 mg | ORAL_TABLET | Freq: Every morning | ORAL | 1 refills | Status: DC

## 2021-03-10 NOTE — Telephone Encounter (Signed)
can you send a new rx for the pistiq 100mg  to [facility]

## 2021-03-10 NOTE — Addendum Note (Signed)
Addended by: [place] Crane on: 03/10/2021 03:53 PM   Modules accepted: Orders

## 2021-03-10 NOTE — Telephone Encounter (Signed)
pt is a dr. [name] patient since dr. [name] is leaving will one of you take over patient care.

## 2021-03-10 NOTE — Addendum Note (Signed)
Addended by: [place] Crane on: 03/10/2021 03:57 PM   Modules accepted: Orders

## 2021-03-10 NOTE — Telephone Encounter (Signed)
This PA is being done by the [facility] office as she is being transferred there.

## 2021-03-10 NOTE — Telephone Encounter (Signed)
Done

## 2021-03-10 NOTE — Telephone Encounter (Signed)
I had informed [facility] to transfer this pt to Dr. Modesta Messing. Lea was out that day, I believe. Please have her see Dr. Modesta Messing in 2 months. Thanks.

## 2021-03-11 ENCOUNTER — Telehealth: Payer: Self-pay

## 2021-03-11 NOTE — Telephone Encounter (Signed)
[employer] prior auth for the pristiq 100mg  was approved from  02-08-21 to 03-10-22

## 2021-03-17 ENCOUNTER — Telehealth: Payer: Self-pay | Admitting: Psychiatry

## 2021-03-21 NOTE — Telephone Encounter (Signed)
Thank you for your relentless efforts.

## 2021-05-05 ENCOUNTER — Other Ambulatory Visit (HOSPITAL_COMMUNITY): Payer: Self-pay | Admitting: Psychiatry

## 2021-05-05 DIAGNOSIS — F3341 Major depressive disorder, recurrent, in partial remission: Secondary | ICD-10-CM

## 2021-05-05 NOTE — Telephone Encounter (Signed)
Message reviewed and acknowledged.

## 2021-05-06 NOTE — Telephone Encounter (Signed)
Messaged reviewed and acknowledged. Thank you.

## 2021-05-11 ENCOUNTER — Telehealth: Payer: Self-pay

## 2021-05-11 NOTE — Telephone Encounter (Signed)
received fax requesting a refill on the trazodone hcl '100mg'$ 

## 2021-05-11 NOTE — Telephone Encounter (Signed)
Decline at this time- she has enough meds until the next visit

## 2021-05-13 NOTE — Telephone Encounter (Signed)
pharmacy called states that patient needs refill on the trazodone. that this is their final attempt.   please call  [phone] ref # [id number]

## 2021-05-13 NOTE — Telephone Encounter (Signed)
Could you verify with the pharmacy. She should have enough meds until the next visit.

## 2021-05-19 NOTE — Progress Notes (Signed)
Virtual Visit via Video Note  I connected with Toni Green on 05/24/21 at  4:00 PM EDT by a video enabled telemedicine application and verified that I am speaking with the correct person using two identifiers.  Location: Patient: home Provider: office Persons participated in the visit- patient, provider    I discussed the limitations of evaluation and management by telemedicine and the availability of in person appointments. The patient expressed understanding and agreed to proceed.  I discussed the assessment and treatment plan with the patient. The patient was provided an opportunity to ask questions and all were answered. The patient agreed with the plan and demonstrated an understanding of the instructions.   The patient was advised to call back or seek an in-person evaluation if the symptoms worsen or if the condition fails to improve as anticipated.  I provided 35 minutes of non-face-to-face time during this encounter.   Norman Clay, MD    [facility] MD/PA/NP OP Progress Note  05/24/2021 5:22 PM Toni Green  MRN:  [id number]  Chief Complaint:  Chief Complaint   Follow-up; Depression; Anxiety    HPI:  Toni Green is a 60 y.o. year old female with a history of depression, binge eating disorder, hypertension, hypothyroidism, s/p roux-en-y gastric bypass in 2008, who is transferred from [name].   She states that she is taking care of her 72 year old mother.  Although she will be happy to take care of her at her place, her mother expects her to stay at her mother's place.  She feels guilty of not always staying with her mother.  She also talks about her sister, who was found to have recurrence of cancer in family.  She is undergoing chemotherapy, and was told that her prognosis is poor.  She feels stressed about this, stating that they have a close relationship.  She describes her husband as fantastic.  He is very supportive.  She also loves her work.  And she also likes that  it has more the flexibility.  However, she is "extremely depressed."  Although she used to be outgoing, she does not want to see anybody; she does not want other people to ask about her mother or her sister as she does not feel comfortable talking about them.  She is not going to church anymore due to the same reason.  She reports fair relationship with her sons, although she wishes to see her grandchildren more often.  She has insomnia, which has worsened since the recurrence of her sister's cancer.  She feels fatigue, which she also attributes to taking propranolol for anxiety.  She has slight decrease in appetite, and has lost 6 pounds since family.  She denies SI.  She denies decreased need for sleep, euphoria or irritability.  She denies hallucinations or paranoia.  She denies any history of trauma.   Substance-she drinks beer only occasionally.  She used CBD gummie for sleep the other day.   Daily routine: Exercise: Employment: Scientist, water quality, supporting president since 2016, healthcare IT for 30 years Support: husband Household: husband, puppy Marital status: married since 1994 Number of children: 2 grown sons   Visit Diagnosis:    ICD-10-CM   1. Anxiety state  F41.1     2. MDD (major depressive disorder), recurrent episode, mild (HCC)  F33.0 lamoTRIgine (LAMICTAL) 150 MG tablet    traZODone (DESYREL) 100 MG tablet    3. 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   Claustrophobia    Complication of surgery [id number]   Overview:  Dilated  pouch found on EGD done 12/2014    Depression    History of kidney stones    Hypertension    Hypoglycemia    Hypothyroidism    Inflammation of sacroiliac joint ([place]) 08/22/2014   Pneumonia 03/2020   PONV (postoperative nausea and vomiting)    NASUEATED/BAD HEADACHES   Seizures ([place]) 1985   x1, after labor/childbirth   Thyroid disease    Wears glasses     Past Surgical History:  Procedure Laterality Date   ABDOMINAL HYSTERECTOMY  yrs ago   partial   APPENDECTOMY  2010   BACK SURGERY  10/2005   lumbar   COLONOSCOPY WITH PROPOFOL N/A 09/23/2016   Procedure: COLONOSCOPY WITH PROPOFOL;  Surgeon: Lucilla Lame, MD;  Location: [facility];  Service: Endoscopy;  Laterality: N/A;   CYSTOSCOPY     x 1   EVALUATION UNDER ANESTHESIA WITH HEMORRHOIDECTOMY N/A 07/06/2020   Procedure: EXAM UNDER ANESTHESIA WITH HEMORRHOIDECTOMY;  Surgeon: Fredirick Maudlin, MD;  Location: [facility];  Service: General;  Laterality: N/A;   GALLBLADDER SURGERY  2009   lap   GASTRIC BYPASS  2008   roux en y   MUCOSAL ADVANCEMENT FLAP N/A 12/10/2020   Procedure: MUCOSAL ADVANCEMENT FLAP;  Surgeon: Leighton Ruff, MD;  Location: [facility];  Service: General;  Laterality: N/A;  8 MIN   POLYPECTOMY  09/23/2016   Procedure: POLYPECTOMY;  Surgeon: Lucilla Lame, MD;  Location: [facility];  Service: Endoscopy;;    Family Psychiatric History: as below  Family History:  Family History  Problem Relation Age of Onset   Depression Mother    Hypertension Mother    Depression Father    Hypertension Father    Depression Sister    Breast cancer Sister 61   Hypertension Maternal Grandfather    Depression Maternal Grandmother    Hypertension Maternal Grandmother    Hypertension Paternal Grandfather    Hypertension Paternal Grandmother     Social History:  Social History   Socioeconomic History   Marital status: Married    Spouse name: Not on file   Number of children: Not on file    Years of education: Not on file   Highest education level: Not on file  Occupational History   Not on file  Tobacco Use   Smoking status: Never   Smokeless tobacco: Never  Vaping Use   Vaping Use: Never used  Substance and Sexual Activity   Alcohol use: Yes    Alcohol/week: 0.0 standard drinks    Comment: RARE   Drug use: No   Sexual activity: Not Currently  Other Topics Concern   Not on file  Social History Narrative   Not on file   Social Determinants of Health   Financial Resource Strain: Not on file  Food Insecurity: Not on file  Transportation Needs: Not on file  Physical Activity: Not on file  Stress: Not on  file  Social Connections: Not on file    Allergies:  Allergies  Allergen Reactions   Losartan Palpitations    Could feel heartbeat (strongly) in neck   Penicillins Rash   Tape Rash    Skin irritation after back surgery white paper tape    Metabolic Disorder Labs: No results found for: HGBA1C, MPG No results found for: PROLACTIN Lab Results  Component Value Date   CHOL 232 (H) 01/07/2021   TRIG 53 01/07/2021   HDL 97 01/07/2021   CHOLHDL 2.4 01/07/2021   LDLCALC 126 (H) 01/07/2021   LDLCALC 111 (H) 01/06/2020   Lab Results  Component Value Date   TSH 1.360 01/07/2021   TSH 1.030 01/06/2020    Therapeutic Level Labs: No results found for: LITHIUM No results found for: VALPROATE No components found for:  CBMZ  Current Medications: Current Outpatient Medications  Medication Sig Dispense Refill   busPIRone (BUSPAR) 5 MG tablet Take 1 tablet (5 mg total) by mouth 2 (two) times daily. 60 tablet 1   Ascorbic Acid (VITAMIN C) 1000 MG tablet Take 1,000 mg by mouth at bedtime.     calcium gluconate 500 MG tablet Take 1 tablet by mouth daily.     cetirizine (ZYRTEC) 10 MG tablet Take 10 mg by mouth as needed.     Cholecalciferol (VITAMIN D-3) 125 MCG (5000 UT) TABS Take 5,000 Units by mouth at bedtime.     Cyanocobalamin (VITAMIN B-12) 2500 MCG  SUBL Place 2,500 mcg under the tongue at bedtime.     ferrous sulfate 325 (65 FE) MG tablet Take 325 mg by mouth at bedtime.     fluticasone (FLONASE) 50 MCG/ACT nasal spray Place 2 sprays into both nostrils daily. (Patient taking differently: Place 2 sprays into both nostrils daily as needed (sinus issues/allergies.).) 16 g 6   furosemide (LASIX) 20 MG tablet TAKE 1 TABLET DAILY (Patient taking differently: Take 20 mg by mouth daily as needed (fluid retention.).) 90 tablet 3   ibuprofen (ADVIL) 800 MG tablet Take 1 tablet (800 mg total) by mouth every 8 (eight) hours as needed. 30 tablet 0   irbesartan (AVAPRO) 300 MG tablet TAKE 1 TABLET DAILY 90 tablet 3   [START ON 06/05/2021] lamoTRIgine (LAMICTAL) 150 MG tablet Take 1 tablet (150 mg total) by mouth every morning. 90 tablet 0   levothyroxine (SYNTHROID) 75 MCG tablet TAKE 1 TABLET DAILY BEFORE BREAKFAST 90 tablet 3   magnesium oxide (MAG-OX) 400 MG tablet Take 400 mg by mouth at bedtime.     PRISTIQ 100 MG 24 hr tablet Take 1 tablet (100 mg total) by mouth every morning. 30 tablet 1   propranolol (INDERAL) 20 MG tablet Take 1 tablet (20 mg total) by mouth 3 (three) times daily as needed (anxiety). 270 tablet 0   rOPINIRole (REQUIP) 2 MG tablet Take 1 tablet (2 mg total) by mouth at bedtime. 90 tablet 1   tamsulosin (FLOMAX) 0.4 MG CAPS capsule Take 1 capsule (0.4 mg total) by mouth daily as needed. (Patient taking differently: Take 0.4 mg by mouth daily as needed (Kidney stones).) 30 capsule 2   topiramate (TOPAMAX) 100 MG tablet TAKE 2 TABLETS AT BEDTIME 90 tablet 7   traMADol (ULTRAM) 50 MG tablet Take 25-50 mg by mouth 2 (two) times daily as needed (back pain.).      [START ON 06/05/2021] traZODone (DESYREL) 100 MG tablet Take 1 tablet (100 mg total) by mouth at bedtime. 90 tablet 0   valACYclovir (  VALTREX) 1000 MG tablet as needed.     No current facility-administered medications for this visit.     Musculoskeletal: Strength & Muscle  Tone:  N/A Gait & Station:  N/A Patient leans: N/A  Psychiatric Specialty Exam: Review of Systems  Psychiatric/Behavioral:  Positive for dysphoric mood and sleep disturbance. Negative for agitation, behavioral problems, confusion, decreased concentration, hallucinations, self-injury and suicidal ideas. The patient is nervous/anxious. The patient is not hyperactive.   All other systems reviewed and are negative.  There were no vitals taken for this visit.There is no height or weight on file to calculate BMI.  General Appearance: Fairly Groomed  Eye Contact:  Good  Speech:  Clear and Coherent  Volume:  Normal  Mood:  Depressed  Affect:  Appropriate, Congruent, Tearful, and reactive  Thought Process:  Coherent  Orientation:  Full (Time, Place, and Person)  Thought Content: Logical   Suicidal Thoughts:  No  Homicidal Thoughts:  No  Memory:  Immediate;   Good  Judgement:  Good  Insight:  Good  Psychomotor Activity:  Normal  Concentration:  Concentration: Good and Attention Span: Good  Recall:  Good  Fund of Knowledge: Good  Language: Good  Akathisia:  No  Handed:  Right  AIMS (if indicated): not done  Assets:  Communication Skills Desire for Improvement  ADL's:  Intact  Cognition: WNL  Sleep:  Poor   Screenings: GAD-7    Flowsheet Row Office Visit from 01/07/2021 in [facility] Office Visit from 04/29/2020 in [facility] Office Visit from 03/24/2020 in [facility] Office Visit from 01/06/2020 in [facility]  Total GAD-7 Score 3 0 0 15      PHQ2-9    [place] Office Visit from 01/07/2021 in [facility] Video Visit from 01/06/2021 in [facility] Office Visit from 04/29/2020 in [facility] Office Visit from 03/24/2020 in [facility] Office Visit from 01/06/2020 in [facility]  PHQ-2 Total Score 2 2 0 0 1  PHQ-9 Total Score 5 7 0 0 1      Flowsheet Row Video Visit from  05/24/2021 in [place] Video Visit from 01/06/2021 in [facility] Admission (Discharged) from 12/10/2020 in [place] No Risk No Risk Error: Question 6 not populated        Assessment and Plan:  Toni Green is a 60 y.o. year old female with a history of depression, binge eating disorder, hypertension, hypothyroidism, s/p roux-en-y gastric bypass in 2008, who is transferred from [name].    1. MDD (major depressive disorder), recurrent episode, mild ([place]) 3. Anxiety state She reports depressive symptoms and worsening in anxiety in the context of taking care of her sister with recurrence of cancer since Feb, and her mother with medical issues.  She reports great support from her husband, and loves her work.  Will start BuSpar to target anxiety.  Will taper off propranolol given she reports fatigue from this medication.  Discussed potential risk of rebound tachycardia.  Will continue lamotrigine for mood dysregulation; will consider tapering this medication off in the future given she does not have any episode consistent with bipolar disorder.  Will continue Pristiq to target depression and anxiety.  Discussed potential risk of sertraline syndrome with concomitant use of tramadol.   2. Binge eating disorder She reports weight gain after gastric bypass surgery, although it is unclear whether it was related to  binge eating episode.  Will continue current dose of topiramate to target binge eating disorder.   # Insomnia She reports worsening in and insomnia since her sisters recurrence of cancer.  Will continue current dose to target insomnia.   Plan Continue  lamotrigine 150 mg daily Continue Pristiq 100 mg daily  Continue topiramate 200 mg daily Decrease propranolol 20 mg twice a day for 3 days, then 20 mg once a day for 3 days, then discontinue Start BuSpar 5 mg twice a day Continue trazodone 100 mg at night  as needed for sleep Next appointment- 9/8 at 46 Am for 30 mins, video - on tramadol  The patient demonstrates the following risk factors for suicide: Chronic risk factors for suicide include: psychiatric disorder of depression . Acute risk factors for suicide include:  her family suffering from medical illness . Protective factors for this patient include: positive social support, coping skills, and hope for the future. Considering these factors, the overall suicide risk at this point appears to be low. Patient is appropriate for outpatient follow up.    Norman Clay, MD 05/24/2021, 5:22 PM

## 2021-05-24 ENCOUNTER — Other Ambulatory Visit: Payer: Self-pay

## 2021-05-24 ENCOUNTER — Telehealth (INDEPENDENT_AMBULATORY_CARE_PROVIDER_SITE_OTHER): Payer: 59 | Admitting: Psychiatry

## 2021-05-24 ENCOUNTER — Encounter: Payer: Self-pay | Admitting: Psychiatry

## 2021-05-24 DIAGNOSIS — F5081 Binge eating disorder: Secondary | ICD-10-CM | POA: Diagnosis not present

## 2021-05-24 DIAGNOSIS — F330 Major depressive disorder, recurrent, mild: Secondary | ICD-10-CM | POA: Diagnosis not present

## 2021-05-24 DIAGNOSIS — F411 Generalized anxiety disorder: Secondary | ICD-10-CM | POA: Diagnosis not present

## 2021-05-24 MED ORDER — BUSPIRONE HCL 5 MG PO TABS: 5.0000 mg | ORAL_TABLET | Freq: Two times a day (BID) | ORAL | 1 refills | Status: DC

## 2021-05-24 MED ORDER — TRAZODONE HCL 100 MG PO TABS: 100.0000 mg | ORAL_TABLET | Freq: Every day | ORAL | 0 refills | Status: DC

## 2021-05-24 MED ORDER — LAMOTRIGINE 150 MG PO TABS: 150.0000 mg | ORAL_TABLET | ORAL | 0 refills | Status: DC

## 2021-05-24 MED ORDER — PRISTIQ 100 MG PO TB24: 100.0000 mg | ORAL_TABLET | Freq: Every morning | ORAL | 1 refills | Status: DC

## 2021-06-18 NOTE — Progress Notes (Signed)
Virtual Visit via Video Note  I connected with Toni Green on 06/24/21 at  8:00 AM EDT by a video enabled telemedicine application and verified that I am speaking with the correct person using two identifiers.  Location: Patient: home Provider: office Persons participated in the visit- patient, provider    I discussed the limitations of evaluation and management by telemedicine and the availability of in person appointments. The patient expressed understanding and agreed to proceed.    I discussed the assessment and treatment plan with the patient. The patient was provided an opportunity to ask questions and all were answered. The patient agreed with the plan and demonstrated an understanding of the instructions.   The patient was advised to call back or seek an in-person evaluation if the symptoms worsen or if the condition fails to improve as anticipated.  I provided 20 minutes of non-face-to-face time during this encounter.   Norman Clay, MD    [facility] MD/PA/NP OP Progress Note  06/24/2021 8:33 AM Toni Green  MRN:  [id number]  Chief Complaint:  Chief Complaint   Depression; Anxiety; Follow-up    HPI:  This is a follow-up appointment for depression and anxiety.  She states that her mother had a cardiac cath yesterday.  It went well.  Her sister is not doing well.  She is on morphine, and will get in clinical trial at [facility].  She reports a very close relationship with her sister, and it has been difficult for her.  Toni Green was able to meet with her sister the other day.  She talks about her son, who she describes as "narcissist."  However, she has good relationship with her daughter-in-law, and enjoys seeing her grandchildren.  She continues to work from home, and denies any difficulty in despite her mood symptoms.  She has depressive symptoms as in PHQ-9. She feels anxious, tense and shaky.  She does not think this shakiness is coming from being off from propranolol.  She  took propranolol yesterday due to significant anxiety.  She finds BuSpar to be helpful for anxiety, and is willing to try higher dose.    157  Wt Readings from Last 3 Encounters:  01/07/21 169 lb (76.7 kg)  12/10/20 173 lb 11.2 oz (78.8 kg)  07/30/20 176 lb 9.6 oz (80.1 kg)     Daily routine: Exercise: Employment: Scientist, water quality, supporting president since 2016, healthcare IT for 30 years Support: husband Household: husband, puppy Marital status: married since 1994 Number of children: 2 grown sons (one from previous marriage, another one from her husband's previous marriage), 2 grandchildren (age 60,13)   Visit Diagnosis:    ICD-10-CM   1. MDD (major depressive disorder), recurrent episode, mild ([place])  F33.0     2. Anxiety state  F41.1     3. Binge eating disorder  F50.81       Past Psychiatric History: Please see initial evaluation for full details. I have reviewed the history. No updates at this time.     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   Claustrophobia    Complication of surgery [id number]   Overview:  Dilated pouch found on EGD done 12/2014  Depression    History of kidney stones    Hypertension    Hypoglycemia    Hypothyroidism    Inflammation of sacroiliac joint ([place]) 08/22/2014   Pneumonia 03/2020   PONV (postoperative nausea and vomiting)    NASUEATED/BAD HEADACHES   Seizures ([place]) 1985   x1, after labor/childbirth   Thyroid disease    Wears glasses     Past Surgical History:  Procedure Laterality Date   ABDOMINAL HYSTERECTOMY  yrs ago   partial   APPENDECTOMY  2010   BACK SURGERY  10/2005   lumbar   COLONOSCOPY WITH PROPOFOL N/A 09/23/2016   Procedure: COLONOSCOPY WITH PROPOFOL;  Surgeon: Lucilla Lame, MD;  Location:  [place];  Service: Endoscopy;  Laterality: N/A;   CYSTOSCOPY     x 1   EVALUATION UNDER ANESTHESIA WITH HEMORRHOIDECTOMY N/A 07/06/2020   Procedure: EXAM UNDER ANESTHESIA WITH HEMORRHOIDECTOMY;  Surgeon: Fredirick Maudlin, MD;  Location: [facility];  Service: General;  Laterality: N/A;   GALLBLADDER SURGERY  2009   lap   GASTRIC BYPASS  2008   roux en y   MUCOSAL ADVANCEMENT FLAP N/A 12/10/2020   Procedure: [place];  Surgeon: Leighton Ruff, MD;  Location: [facility];  Service: General;  Laterality: N/A;  60 MIN   POLYPECTOMY  09/23/2016   Procedure: POLYPECTOMY;  Surgeon: Lucilla Lame, MD;  Location: [place];  Service: Endoscopy;;    Family Psychiatric History: Please see initial evaluation for full details. I have reviewed the history. No updates at this time.     Family History:  Family History  Problem Relation Age of Onset   Depression Mother    Hypertension Mother    Depression Father    Hypertension Father    Depression Sister    Breast cancer Sister 57   Hypertension Maternal Grandfather    Depression Maternal Grandmother    Hypertension Maternal Grandmother    Hypertension Paternal Grandfather    Hypertension Paternal Grandmother     Social History:  Social History   Socioeconomic History   Marital status: Married    Spouse name: Not on file   Number of children: Not on file   Years of education: Not on file   Highest education level: Not on file  Occupational History   Not on file  Tobacco Use   Smoking status: Never   Smokeless tobacco: Never  Vaping Use   Vaping Use: Never used  Substance and Sexual Activity   Alcohol use: Yes    Alcohol/week: 0.0 standard drinks    Comment: RARE   Drug use: No   Sexual activity: Not Currently  Other Topics Concern   Not on file  Social History Narrative   Not on file   Social Determinants of Health   Financial Resource Strain: Not on file  Food Insecurity:  Not on file  Transportation Needs: Not on file  Physical Activity: Not on file  Stress: Not on file  Social Connections: Not on file    Allergies:  Allergies  Allergen Reactions   Losartan Palpitations    Could feel heartbeat (strongly) in neck   Penicillins Rash   Tape Rash    Skin irritation after back surgery white paper tape    Metabolic Disorder Labs: No results found for: HGBA1C, MPG No results found for: PROLACTIN Lab Results  Component Value Date   CHOL 232 (H) 01/07/2021   TRIG 53 01/07/2021   HDL 97 01/07/2021  CHOLHDL 2.4 01/07/2021   LDLCALC 126 (H) 01/07/2021   LDLCALC 111 (H) 01/06/2020   Lab Results  Component Value Date   TSH 1.360 01/07/2021   TSH 1.030 01/06/2020    Therapeutic Level Labs: No results found for: LITHIUM No results found for: VALPROATE No components found for:  CBMZ  Current Medications: Current Outpatient Medications  Medication Sig Dispense Refill   [START ON 06/27/2021] busPIRone (BUSPAR) 7.5 MG tablet Take 1 tablet (7.5 mg total) by mouth 2 (two) times daily. 60 tablet 0   Ascorbic Acid (VITAMIN C) 1000 MG tablet Take 1,000 mg by mouth at bedtime.     busPIRone (BUSPAR) 5 MG tablet Take 1 tablet (5 mg total) by mouth 2 (two) times daily. 60 tablet 1   calcium gluconate 500 MG tablet Take 1 tablet by mouth daily.     cetirizine (ZYRTEC) 10 MG tablet Take 10 mg by mouth as needed.     Cholecalciferol (VITAMIN D-3) 125 MCG (5000 UT) TABS Take 5,000 Units by mouth at bedtime.     Cyanocobalamin (VITAMIN B-12) 2500 MCG SUBL Place 2,500 mcg under the tongue at bedtime.     ferrous sulfate 325 (65 FE) MG tablet Take 325 mg by mouth at bedtime.     fluticasone (FLONASE) 50 MCG/ACT nasal spray Place 2 sprays into both nostrils daily. (Patient taking differently: Place 2 sprays into both nostrils daily as needed (sinus issues/allergies.).) 16 g 6   furosemide (LASIX) 20 MG tablet TAKE 1 TABLET DAILY (Patient taking differently: Take 20 mg  by mouth daily as needed (fluid retention.).) 90 tablet 3   ibuprofen (ADVIL) 800 MG tablet Take 1 tablet (800 mg total) by mouth every 8 (eight) hours as needed. 30 tablet 0   irbesartan (AVAPRO) 300 MG tablet TAKE 1 TABLET DAILY 90 tablet 3   lamoTRIgine (LAMICTAL) 150 MG tablet Take 1 tablet (150 mg total) by mouth every morning. 90 tablet 0   levothyroxine (SYNTHROID) 75 MCG tablet TAKE 1 TABLET DAILY BEFORE BREAKFAST 90 tablet 3   magnesium oxide (MAG-OX) 400 MG tablet Take 400 mg by mouth at bedtime.     PRISTIQ 100 MG 24 hr tablet Take 1 tablet (100 mg total) by mouth every morning. 30 tablet 1   propranolol (INDERAL) 20 MG tablet Take 1 tablet (20 mg total) by mouth 3 (three) times daily as needed (anxiety). 270 tablet 0   rOPINIRole (REQUIP) 2 MG tablet Take 1 tablet (2 mg total) by mouth at bedtime. 90 tablet 1   tamsulosin (FLOMAX) 0.4 MG CAPS capsule Take 1 capsule (0.4 mg total) by mouth daily as needed. (Patient taking differently: Take 0.4 mg by mouth daily as needed (Kidney stones).) 30 capsule 2   topiramate (TOPAMAX) 100 MG tablet TAKE 2 TABLETS AT BEDTIME 90 tablet 7   traMADol (ULTRAM) 50 MG tablet Take 25-50 mg by mouth 2 (two) times daily as needed (back pain.).      traZODone (DESYREL) 100 MG tablet Take 1 tablet (100 mg total) by mouth at bedtime. 90 tablet 0   valACYclovir (VALTREX) 1000 MG tablet as needed.     No current facility-administered medications for this visit.     Musculoskeletal: Strength & Muscle Tone:  N/A Gait & Station:  N/A Patient leans: N/A  Psychiatric Specialty Exam: Review of Systems  Psychiatric/Behavioral:  Positive for dysphoric mood and sleep disturbance. Negative for agitation, behavioral problems, confusion, decreased concentration, hallucinations, self-injury and suicidal ideas. The patient is nervous/anxious.  The patient is not hyperactive.   All other systems reviewed and are negative.  There were no vitals taken for this  visit.There is no height or weight on file to calculate BMI.  General Appearance: Fairly Groomed  Eye Contact:  Good  Speech:  Clear and Coherent  Volume:  Normal  Mood:  Anxious  Affect:  Appropriate and Congruent, tearful at times  Thought Process:  Coherent  Orientation:  Full (Time, Place, and Person)  Thought Content: Logical   Suicidal Thoughts:  No  Homicidal Thoughts:  No  Memory:  Immediate;   Good  Judgement:  Good  Insight:  Good  Psychomotor Activity:  Normal  Concentration:  Concentration: Good and Attention Span: Good  Recall:  Good  Fund of Knowledge: Good  Language: Good  Akathisia:  No  Handed:  Right  AIMS (if indicated): not done  Assets:  Communication Skills Desire for Improvement  ADL's:  Intact  Cognition: WNL  Sleep:  Poor   Screenings: GAD-7    Flowsheet Row Office Visit from 01/07/2021 in [facility] Office Visit from 04/29/2020 in [facility] Office Visit from 03/24/2020 in [facility] Office Visit from 01/06/2020 in [facility] [place]  Total GAD-7 Score 3 0 0 15      PHQ2-9    Flowsheet Row Video Visit from 06/24/2021 in [place] Office Visit from 01/07/2021 in [facility] Video Visit from 01/06/2021 in [facility] Office Visit from 04/29/2020 in [place] Visit from 03/24/2020 in [facility]  PHQ-2 Total Score '2 2 2 '$ 0 0  PHQ-9 Total Score '7 5 7 '$ 0 0      Flowsheet Row Video Visit from 06/24/2021 in [place] Video Visit from 05/24/2021 in [place] Video Visit from 01/06/2021 in [place] No Risk No Risk No Risk        Assessment and Plan:  Toni Green is a 60 y.o. year old female with a history of depression, binge eating disorder, hypertension, hypothyroidism, s/p roux-en-y gastric bypass in 2008, who  presents for follow up appointment for below.    1. MDD (major depressive disorder), recurrent episode, mild ([place]) 2. Anxiety state She continues to report depressive symptoms and anxiety, although there has been slight improvement in anxiety since starting bupropion.  Psychosocial stressors includes taking care of her sister with regarding cancer since Feb, her mother with medical issues, and conflict with her son.  She has been able to work without significant issues, and reports great support from her husband.  We do up titration of BuSpar to target anxiety.  Will continue lamotrigine for mood dysregulation; will consider tapering off this medication in the future given she does not have any episode consistent with bipolar disorder.  Will continue Pristiq to target depression and anxiety.   3. Binge eating disorder She has been on topiramate before seen by this clinician.  It is unclear whether this is related to binge eating episode.  Will continue current dose of topiramate to target binge eating disorder; however, we consider tapering it off in the future now she has decrease in appetite.     # Insomnia She continues to experience insomnia.  She denies any snoring.  Will continue current dose of trazodone to target insomnia.    Plan Continue  lamotrigine 150 mg daily Continue Pristiq 100 mg daily  Continue  topiramate 200 mg daily Discontinue propranolol Increase buspar 7.5 mg twice a day Continue trazodone 100 mg at night as needed for sleep Next appointment- 10/10 at 8 AM, video - on tramadol   The patient demonstrates the following risk factors for suicide: Chronic risk factors for suicide include: psychiatric disorder of depression . Acute risk factors for suicide include:  her family suffering from medical illness . Protective factors for this patient include: positive social support, coping skills, and hope for the future. Considering these factors, the overall suicide risk at this  point appears to be low. Patient is appropriate for outpatient follow up.   Norman Clay, MD 06/24/2021, 8:33 AM

## 2021-06-24 ENCOUNTER — Telehealth (INDEPENDENT_AMBULATORY_CARE_PROVIDER_SITE_OTHER): Payer: 59 | Admitting: Psychiatry

## 2021-06-24 ENCOUNTER — Encounter: Payer: Self-pay | Admitting: Psychiatry

## 2021-06-24 ENCOUNTER — Other Ambulatory Visit: Payer: Self-pay

## 2021-06-24 DIAGNOSIS — F5081 Binge eating disorder: Secondary | ICD-10-CM

## 2021-06-24 DIAGNOSIS — F330 Major depressive disorder, recurrent, mild: Secondary | ICD-10-CM | POA: Diagnosis not present

## 2021-06-24 DIAGNOSIS — F411 Generalized anxiety disorder: Secondary | ICD-10-CM | POA: Diagnosis not present

## 2021-06-24 MED ORDER — BUSPIRONE HCL 7.5 MG PO TABS: 7.5000 mg | ORAL_TABLET | Freq: Two times a day (BID) | ORAL | 0 refills | Status: DC

## 2021-06-24 NOTE — Patient Instructions (Signed)
Continue  lamotrigine 150 mg daily Continue Pristiq 100 mg daily  Continue topiramate 200 mg daily Discontinue propranolol Increase buspar 7.5 mg twice a day Continue trazodone 100 mg at night as needed for sleep Next appointment- 10/10 at 8 AM,

## 2021-07-09 ENCOUNTER — Encounter: Payer: Self-pay | Admitting: Internal Medicine

## 2021-07-09 ENCOUNTER — Telehealth (INDEPENDENT_AMBULATORY_CARE_PROVIDER_SITE_OTHER): Payer: 59 | Admitting: Internal Medicine

## 2021-07-09 ENCOUNTER — Telehealth: Payer: Self-pay

## 2021-07-09 ENCOUNTER — Ambulatory Visit: Payer: Self-pay

## 2021-07-09 VITALS — Temp 100.4°F

## 2021-07-09 DIAGNOSIS — U071 COVID-19: Secondary | ICD-10-CM | POA: Diagnosis not present

## 2021-07-09 MED ORDER — PROMETHAZINE-DM 6.25-15 MG/5ML PO SYRP: 5.0000 mL | ORAL_SOLUTION | Freq: Four times a day (QID) | ORAL | 0 refills | Status: DC | PRN

## 2021-07-09 MED ORDER — MOLNUPIRAVIR EUA 200MG CAPSULE: 4.0000 | ORAL_CAPSULE | Freq: Two times a day (BID) | ORAL | 0 refills | Status: AC

## 2021-07-09 NOTE — Telephone Encounter (Signed)
Patient schedule for today for VV at 420.

## 2021-07-09 NOTE — Telephone Encounter (Signed)
Pt. And husband positive for COVID 19. Pt. Positive today. States "I feel terrible." Fever 100.4, chills, headache, body aches. Interested in anti-viral medication. No availability for virtual visit today. Spoke with Levada Dy in the practice and will forward triage for disposition.   Answer Assessment - Initial Assessment Questions 1. COVID-19 DIAGNOSIS: "Who made your COVID-19 diagnosis?" "Was it confirmed by a positive lab test or self-test?" If not diagnosed by a doctor (or NP/PA), ask "Are there lots of cases (community spread) where you live?" Note: See public health department website, if unsure.     Home test 2. COVID-19 EXPOSURE: "Was there any known exposure to COVID before the symptoms began?" CDC Definition of close contact: within 6 feet (2 meters) for a total of 15 minutes or more over a 24-hour period.      Yes 3. ONSET: "When did the COVID-19 symptoms start?"      Today 4. WORST SYMPTOM: "What is your worst symptom?" (e.g., cough, fever, shortness of breath, muscle aches)     Body aches, chills 5. COUGH: "Do you have a cough?" If Yes, ask: "How bad is the cough?"       Mild 6. FEVER: "Do you have a fever?" If Yes, ask: "What is your temperature, how was it measured, and when did it start?"     100.4 7. RESPIRATORY STATUS: "Describe your breathing?" (e.g., shortness of breath, wheezing, unable to speak)      No 8. BETTER-SAME-WORSE: "Are you getting better, staying the same or getting worse compared to yesterday?"  If getting worse, ask, "In what way?"     Worse 9. HIGH RISK DISEASE: "Do you have any chronic medical problems?" (e.g., asthma, heart or lung disease, weak immune system, obesity, etc.)     HTN 10. VACCINE: "Have you had the COVID-19 vaccine?" If Yes, ask: "Which one, how many shots, when did you get it?"       No 11. BOOSTER: "Have you received your COVID-19 booster?" If Yes, ask: "Which one and when did you get it?"       No 12. PREGNANCY: "Is there any chance you  are pregnant?" "When was your last menstrual period?"       No 13. OTHER SYMPTOMS: "Do you have any other symptoms?"  (e.g., chills, fatigue, headache, loss of smell or taste, muscle pain, sore throat)       Headache, body aches, chills, fever 14. O2 SATURATION MONITOR:  "Do you use an oxygen saturation monitor (pulse oximeter) at home?" If Yes, ask "What is your reading (oxygen level) today?" "What is your usual oxygen saturation reading?" (e.g., 95%)       No  Protocols used: Coronavirus (COVID-19) Diagnosed or Suspected-A-AH

## 2021-07-09 NOTE — Telephone Encounter (Signed)

## 2021-07-09 NOTE — Progress Notes (Signed)
Date:  07/09/2021   Name:  Toni Green   DOB:  [birth year]   MRN:  [id number]  This encounter was conducted via video encounter due to the need for social distancing in light of the Covid-19 pandemic.  The patient was correctly identified.  I advised that I am conducting the visit from a secure room in my office at [facility] clinic.  The patient is located at home. The limitations of this form of encounter were discussed with the patient and he/she agreed to proceed.  Some vital signs will be absent.  Chief Complaint: Covid Positive (Test positive today, headache, chills, dry cough, fever 100.4 oral, nausea )  Cough This is a new problem. The current episode started today. The problem occurs every few hours. The cough is Non-productive. Associated symptoms include chills, ear pain, a fever, postnasal drip and sweats. Pertinent negatives include no chest pain, sore throat, shortness of breath or wheezing. Associated symptoms comments: Tested positive for covid today.   Immunization History  Administered Date(s) Administered   Influenza-Unspecified 08/01/2015, 08/17/2017   Tdap 05/25/2016    Lab Results  Component Value Date   CREATININE 0.84 01/07/2021   BUN 8 01/07/2021   NA 141 01/07/2021   K 3.8 01/07/2021   CL 104 01/07/2021   CO2 21 01/07/2021   Lab Results  Component Value Date   CHOL 232 (H) 01/07/2021   HDL 97 01/07/2021   LDLCALC 126 (H) 01/07/2021   TRIG 53 01/07/2021   CHOLHDL 2.4 01/07/2021   Lab Results  Component Value Date   TSH 1.360 01/07/2021   No results found for: HGBA1C Lab Results  Component Value Date   WBC 10.4 01/07/2021   HGB 13.6 01/07/2021   HCT 41.1 01/07/2021   MCV 93 01/07/2021   PLT 332 01/07/2021   Lab Results  Component Value Date   ALT 43 (H) 01/07/2021   AST 30 01/07/2021   ALKPHOS 227 (H) 01/07/2021   BILITOT 0.2 01/07/2021     Review of Systems  Constitutional:  Positive for chills and fever.  HENT:  Positive  for congestion, ear pain and postnasal drip. Negative for sore throat.   Respiratory:  Positive for cough. Negative for shortness of breath and wheezing.   Cardiovascular:  Negative for chest pain.  Gastrointestinal:  Positive for nausea. Negative for vomiting.   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   Essential (primary) hypertension 07/20/2015   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 disorder 07/20/2015   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   DDD (degenerative disc disease), cervical 03/17/2014   Calculus of kidney 02/13/2013    Allergies  Allergen Reactions   Losartan Palpitations    Could feel heartbeat (strongly) in neck   Penicillins Rash   Tape Rash    Skin irritation after back surgery white paper tape    Past Surgical History:  Procedure Laterality Date   ABDOMINAL HYSTERECTOMY  yrs  ago   partial   APPENDECTOMY  2010   BACK SURGERY  10/2005   lumbar   COLONOSCOPY WITH PROPOFOL N/A 09/23/2016   Procedure: COLONOSCOPY WITH PROPOFOL;  Surgeon: Lucilla Lame, MD;  Location: [place];  Service: Endoscopy;  Laterality: N/A;   CYSTOSCOPY     x 1   EVALUATION UNDER ANESTHESIA WITH HEMORRHOIDECTOMY N/A 07/06/2020   Procedure: EXAM UNDER ANESTHESIA WITH HEMORRHOIDECTOMY;  Surgeon: Fredirick Maudlin, MD;  Location: [facility];  Service: General;  Laterality: N/A;   GALLBLADDER SURGERY  2009   lap   GASTRIC BYPASS   2008   roux en y   MUCOSAL ADVANCEMENT FLAP N/A 12/10/2020   Procedure: MUCOSAL ADVANCEMENT FLAP;  Surgeon: Leighton Ruff, MD;  Location: [facility];  Service: General;  Laterality: N/A;  60 MIN   POLYPECTOMY  09/23/2016   Procedure: POLYPECTOMY;  Surgeon: Lucilla Lame, MD;  Location: [place];  Service: Endoscopy;;    Social History   Tobacco Use   Smoking status: Never   Smokeless tobacco: Never  Vaping Use   Vaping Use: Never used  Substance Use Topics   Alcohol use: Yes    Alcohol/week: 0.0 standard drinks    Comment: RARE   Drug use: No     Medication list has been reviewed and updated.  Current Meds  Medication Sig   Ascorbic Acid (VITAMIN C) 1000 MG tablet Take 1,000 mg by mouth at bedtime.   busPIRone (BUSPAR) 5 MG tablet Take 1 tablet (5 mg total) by mouth 2 (two) times daily.   cetirizine (ZYRTEC) 10 MG tablet Take 10 mg by mouth as needed.   Cholecalciferol (VITAMIN D-3) 125 MCG (5000 UT) TABS Take 5,000 Units by mouth at bedtime.   Cyanocobalamin (VITAMIN B-12) 2500 MCG SUBL Place 2,500 mcg under the tongue at bedtime.   ferrous sulfate 325 (65 FE) MG tablet Take 325 mg by mouth at bedtime.   fluticasone (FLONASE) 50 MCG/ACT nasal spray Place 2 sprays into both nostrils daily. (Patient taking differently: Place 2 sprays into both nostrils daily as needed (sinus issues/allergies.).)   furosemide (LASIX) 20 MG tablet TAKE 1 TABLET DAILY (Patient taking differently: Take 20 mg by mouth daily as needed (fluid retention.).)   ibuprofen (ADVIL) 800 MG tablet Take 1 tablet (800 mg total) by mouth every 8 (eight) hours as needed.   irbesartan (AVAPRO) 300 MG tablet TAKE 1 TABLET DAILY (Patient taking differently: 150 mg.)   lamoTRIgine (LAMICTAL) 150 MG tablet Take 1 tablet (150 mg total) by mouth every morning.   levothyroxine (SYNTHROID) 75 MCG tablet TAKE 1 TABLET DAILY BEFORE BREAKFAST   magnesium oxide (MAG-OX) 400 MG tablet Take 400 mg by  mouth at bedtime.   PRISTIQ 100 MG 24 hr tablet Take 1 tablet (100 mg total) by mouth every morning.   propranolol (INDERAL) 20 MG tablet Take 1 tablet (20 mg total) by mouth 3 (three) times daily as needed (anxiety).   rOPINIRole (REQUIP) 2 MG tablet Take 1 tablet (2 mg total) by mouth at bedtime.   tamsulosin (FLOMAX) 0.4 MG CAPS capsule Take 1 capsule (0.4 mg total) by mouth daily as needed. (Patient taking differently: Take 0.4 mg by mouth daily as needed (Kidney stones).)   topiramate (TOPAMAX) 100 MG tablet TAKE 2 TABLETS AT BEDTIME   traMADol (ULTRAM) 50 MG tablet Take 25-50 mg by mouth 2 (two) times daily as needed (back pain.).    traZODone (DESYREL) 100 MG tablet Take 1  tablet (100 mg total) by mouth at bedtime.   valACYclovir (VALTREX) 1000 MG tablet as needed.   [DISCONTINUED] calcium gluconate 500 MG tablet Take 1 tablet by mouth daily.    PHQ 2/9 Scores 07/09/2021 01/07/2021 04/29/2020 03/24/2020  PHQ - 2 Score 0 2 0 0  PHQ- 9 Score 0 5 0 0  Some encounter information is confidential and restricted. Go to Review Flowsheets activity to see all data.    GAD 7 : Generalized Anxiety Score 07/09/2021 01/07/2021 04/29/2020 03/24/2020  Nervous, Anxious, on Edge 0 1 0 0  Control/stop worrying 0 1 0 0  Worry too much - different things 0 1 0 0  Trouble relaxing 0 0 0 0  Restless 0 0 0 0  Easily annoyed or irritable 0 0 0 0  Afraid - awful might happen 0 0 0 0  Total GAD 7 Score 0 3 0 0  Anxiety Difficulty - - Not difficult at all Not difficult at all    BP Readings from Last 3 Encounters:  01/07/21 118/88  12/10/20 137/90  07/30/20 (!) 151/108    Physical Exam Constitutional:      Appearance: She is ill-appearing.  Pulmonary:     Effort: Pulmonary effort is normal.     Comments: No cough or wheezing noted Neurological:     Mental Status: She is alert.  Psychiatric:        Attention and Perception: Attention normal.        Mood and Affect: Mood normal.    Wt Readings from  Last 3 Encounters:  01/07/21 169 lb (76.7 kg)  12/10/20 173 lb 11.2 oz (78.8 kg)  07/30/20 176 lb 9.6 oz (80.1 kg)    Temp (!) 100.4 F (38 C)   Assessment and Plan: 1. COVID-19 virus infection Recommend Tylenol around the clock, fluids and rest Call or be seen in person if shortness of breath, persistent fever - molnupiravir EUA (LAGEVRIO) 200 mg CAPS capsule; Take 4 capsules (800 mg total) by mouth 2 (two) times daily for 5 days.  Dispense: 40 capsule; Refill: 0 - promethazine-dextromethorphan (PROMETHAZINE-DM) 6.25-15 MG/5ML syrup; Take 5 mLs by mouth 4 (four) times daily as needed for cough.  Dispense: 118 mL; Refill: 0  I spent 8 minutes on this encounter. Partially dictated using Editor, commissioning. Any errors are unintentional.  Halina Maidens, MD [facility]  07/09/2021

## 2021-07-14 NOTE — Telephone Encounter (Signed)
CLOSE

## 2021-07-16 ENCOUNTER — Other Ambulatory Visit: Payer: Self-pay | Admitting: Internal Medicine

## 2021-07-16 ENCOUNTER — Telehealth: Payer: Self-pay

## 2021-07-16 DIAGNOSIS — G2581 Restless legs syndrome: Secondary | ICD-10-CM

## 2021-07-16 NOTE — Telephone Encounter (Signed)
Copied from [place] [id number]. Topic: General - Other >> Jul 16, 2021 10:56 AM Leward Quan A wrote: Reason for CRM: Patient called in to inform / inquire of Dr Army Melia say that she is still testing positive for Covid and need to know if she need another round of medication since she is still coughing and congested and nauseous needing an opinion. Please call  Ph# [phone]

## 2021-07-21 NOTE — Progress Notes (Signed)
Virtual Visit via Video Note  I connected with Toni Green on 07/26/21 at  8:00 AM EDT by a video enabled telemedicine application and verified that I am speaking with the correct person using two identifiers.  Location: Patient: home Provider: office Persons participated in the visit- patient, provider    I discussed the limitations of evaluation and management by telemedicine and the availability of in person appointments. The patient expressed understanding and agreed to proceed.    I discussed the assessment and treatment plan with the patient. The patient was provided an opportunity to ask questions and all were answered. The patient agreed with the plan and demonstrated an understanding of the instructions.   The patient was advised to call back or seek an in-person evaluation if the symptoms worsen or if the condition fails to improve as anticipated.  I provided 15 minutes of non-face-to-face time during this encounter.   Norman Clay, MD    [facility] MD/PA/NP OP Progress Note  07/26/2021 8:28 AM Toni Green  MRN:  [id number]  Chief Complaint:  Chief Complaint   Follow-up; Anxiety; Depression    HPI:  This is a follow-up appointment for depression and anxiety.  She states that she now has returned home.  Her sister is getting trials at [facility].  Her sister has been weak.  Her mother has been doing well.  Toni Green and her husband got COVID 2 weeks ago.  She has been doing better without significant lingering symptoms.  She believes her mood has been better.  She does not have crying spells as much compared to before.  Although she occasionally feels down, she thinks it is more situational. She has middle insomnia.  She has good energy.  She has good appetite without binge eating.  She denies SI.  She feels less anxious.  She denies panic attacks.  She took propranolol a few times since the last visit.  Although she is interested in tapering off lamotrigine in the future,  she would like to stay on the same regimen at this time as she has been doing good lately.    Daily routine: Exercise: Employment: Scientist, water quality, supporting president since 2016, healthcare IT for 30 years Support: husband Household: husband, puppy Marital status: married since 1994 Number of children: 2 grown sons (one from previous marriage, another one from her husband's previous marriage), 2 grandchildren (age 73,13)  Visit Diagnosis:    ICD-10-CM   1. MDD (major depressive disorder), recurrent, in partial remission (HCC)  F33.41 lamoTRIgine (LAMICTAL) 150 MG tablet    traZODone (DESYREL) 100 MG tablet    2. Binge eating disorder  F50.81 PRISTIQ 100 MG 24 hr tablet    3. Anxiety state  F41.1     4. Insomnia, unspecified type  G47.00       Past Psychiatric History: Please see initial evaluation for full details. I have reviewed the history. No updates at this time.     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   Claustrophobia    Complication of surgery 0/05/6577   Overview:  Dilated pouch found on EGD done 12/2014    Depression    History of kidney stones  Hypertension    Hypoglycemia    Hypothyroidism    Inflammation of sacroiliac joint ([place]) 08/22/2014   Pneumonia 03/2020   PONV (postoperative nausea and vomiting)    NASUEATED/BAD HEADACHES   Seizures ([place]) 1985   x1, after labor/childbirth   Thyroid disease    Wears glasses     Past Surgical History:  Procedure Laterality Date   ABDOMINAL HYSTERECTOMY  yrs ago   partial   APPENDECTOMY  2010   BACK SURGERY  10/2005   lumbar   COLONOSCOPY WITH PROPOFOL N/A 09/23/2016   Procedure: COLONOSCOPY WITH PROPOFOL;  Surgeon: Lucilla Lame, MD;  Location: [place];  Service: Endoscopy;   Laterality: N/A;   CYSTOSCOPY     x 1   EVALUATION UNDER ANESTHESIA WITH HEMORRHOIDECTOMY N/A 07/06/2020   Procedure: EXAM UNDER ANESTHESIA WITH HEMORRHOIDECTOMY;  Surgeon: Fredirick Maudlin, MD;  Location: [facility];  Service: General;  Laterality: N/A;   GALLBLADDER SURGERY  2009   lap   GASTRIC BYPASS  2008   roux en y   MUCOSAL ADVANCEMENT FLAP N/A 12/10/2020   Procedure: [place];  Surgeon: Leighton Ruff, MD;  Location: [facility];  Service: General;  Laterality: N/A;  60 MIN   POLYPECTOMY  09/23/2016   Procedure: POLYPECTOMY;  Surgeon: Lucilla Lame, MD;  Location: [place];  Service: Endoscopy;;    Family Psychiatric History: Please see initial evaluation for full details. I have reviewed the history. No updates at this time.     Family History:  Family History  Problem Relation Age of Onset   Depression Mother    Hypertension Mother    Depression Father    Hypertension Father    Depression Sister    Breast cancer Sister 36   Hypertension Maternal Grandfather    Depression Maternal Grandmother    Hypertension Maternal Grandmother    Hypertension Paternal Grandfather    Hypertension Paternal Grandmother     Social History:  Social History   Socioeconomic History   Marital status: Married    Spouse name: Not on file   Number of children: Not on file   Years of education: Not on file   Highest education level: Not on file  Occupational History   Not on file  Tobacco Use   Smoking status: Never   Smokeless tobacco: Never  Vaping Use   Vaping Use: Never used  Substance and Sexual Activity   Alcohol use: Yes    Alcohol/week: 0.0 standard drinks    Comment: RARE   Drug use: No   Sexual activity: Not Currently  Other Topics Concern   Not on file  Social History Narrative   Not on file   Social Determinants of Health   Financial Resource Strain: Not on file  Food Insecurity: Not on file  Transportation Needs: Not on  file  Physical Activity: Not on file  Stress: Not on file  Social Connections: Not on file    Allergies:  Allergies  Allergen Reactions   Losartan Palpitations    Could feel heartbeat (strongly) in neck   Penicillins Rash   Tape Rash    Skin irritation after back surgery white paper tape    Metabolic Disorder Labs: No results found for: HGBA1C, MPG No results found for: PROLACTIN Lab Results  Component Value Date   CHOL 232 (H) 01/07/2021   TRIG 53 01/07/2021   HDL 97 01/07/2021   CHOLHDL 2.4 01/07/2021   LDLCALC 126 (H) 01/07/2021  LDLCALC 111 (H) 01/06/2020   Lab Results  Component Value Date   TSH 1.360 01/07/2021   TSH 1.030 01/06/2020    Therapeutic Level Labs: No results found for: LITHIUM No results found for: VALPROATE No components found for:  CBMZ  Current Medications: Current Outpatient Medications  Medication Sig Dispense Refill   Ascorbic Acid (VITAMIN C) 1000 MG tablet Take 1,000 mg by mouth at bedtime.     busPIRone (BUSPAR) 7.5 MG tablet Take 1 tablet (7.5 mg total) by mouth 2 (two) times daily. 180 tablet 0   cetirizine (ZYRTEC) 10 MG tablet Take 10 mg by mouth as needed.     Cholecalciferol (VITAMIN D-3) 125 MCG (5000 UT) TABS Take 5,000 Units by mouth at bedtime.     Cyanocobalamin (VITAMIN B-12) 2500 MCG SUBL Place 2,500 mcg under the tongue at bedtime.     ferrous sulfate 325 (65 FE) MG tablet Take 325 mg by mouth at bedtime.     fluticasone (FLONASE) 50 MCG/ACT nasal spray Place 2 sprays into both nostrils daily. (Patient taking differently: Place 2 sprays into both nostrils daily as needed (sinus issues/allergies.).) 16 g 6   furosemide (LASIX) 20 MG tablet TAKE 1 TABLET DAILY (Patient taking differently: Take 20 mg by mouth daily as needed (fluid retention.).) 90 tablet 3   ibuprofen (ADVIL) 800 MG tablet Take 1 tablet (800 mg total) by mouth every 8 (eight) hours as needed. 30 tablet 0   irbesartan (AVAPRO) 300 MG tablet TAKE 1 TABLET DAILY  (Patient taking differently: 150 mg.) 90 tablet 3   [START ON 09/05/2021] lamoTRIgine (LAMICTAL) 150 MG tablet Take 1 tablet (150 mg total) by mouth every morning. 90 tablet 0   levothyroxine (SYNTHROID) 75 MCG tablet TAKE 1 TABLET DAILY BEFORE BREAKFAST 90 tablet 3   magnesium oxide (MAG-OX) 400 MG tablet Take 400 mg by mouth at bedtime.     PRISTIQ 100 MG 24 hr tablet Take 1 tablet (100 mg total) by mouth every morning. 30 tablet 2   promethazine-dextromethorphan (PROMETHAZINE-DM) 6.25-15 MG/5ML syrup Take 5 mLs by mouth 4 (four) times daily as needed for cough. 118 mL 0   propranolol (INDERAL) 20 MG tablet Take 1 tablet (20 mg total) by mouth 3 (three) times daily as needed (anxiety). 270 tablet 0   rOPINIRole (REQUIP) 2 MG tablet TAKE 1 TABLET AT BEDTIME (REPLACES 0.25 MG) 90 tablet 1   tamsulosin (FLOMAX) 0.4 MG CAPS capsule Take 1 capsule (0.4 mg total) by mouth daily as needed. (Patient taking differently: Take 0.4 mg by mouth daily as needed (Kidney stones).) 30 capsule 2   topiramate (TOPAMAX) 100 MG tablet TAKE 2 TABLETS AT BEDTIME 90 tablet 7   traMADol (ULTRAM) 50 MG tablet Take 25-50 mg by mouth 2 (two) times daily as needed (back pain.).      [START ON 09/05/2021] traZODone (DESYREL) 100 MG tablet Take 1 tablet (100 mg total) by mouth at bedtime. 90 tablet 0   valACYclovir (VALTREX) 1000 MG tablet as needed.     No current facility-administered medications for this visit.     Musculoskeletal: Strength & Muscle Tone:  N/A Gait & Station:  N/A Patient leans: N/A  Psychiatric Specialty Exam: Review of Systems  Psychiatric/Behavioral:  Positive for sleep disturbance. Negative for agitation, behavioral problems, confusion, decreased concentration, dysphoric mood, hallucinations, self-injury and suicidal ideas. The patient is nervous/anxious. The patient is not hyperactive.   All other systems reviewed and are negative.  There were no vitals taken  for this visit.There is no height  or weight on file to calculate BMI.  General Appearance: Fairly Groomed  Eye Contact:  Good  Speech:  Clear and Coherent  Volume:  Normal  Mood:   better  Affect:  Appropriate, Congruent, and Full Range  Thought Process:  Coherent  Orientation:  Full (Time, Place, and Person)  Thought Content: Logical   Suicidal Thoughts:  No  Homicidal Thoughts:  No  Memory:  Immediate;   Good  Judgement:  Good  Insight:  Good  Psychomotor Activity:  Normal  Concentration:  Concentration: Good and Attention Span: Good  Recall:  Good  Fund of Knowledge: Good  Language: Good  Akathisia:  No  Handed:  Right  AIMS (if indicated): not done  Assets:  Communication Skills Desire for Improvement  ADL's:  Intact  Cognition: WNL  Sleep:  Fair   Screenings: GAD-7    Flowsheet Row Video Visit from 07/09/2021 in [facility] Office Visit from 01/07/2021 in [facility] Office Visit from 04/29/2020 in [facility] Office Visit from 03/24/2020 in [facility] Office Visit from 01/06/2020 in [facility]  Total GAD-7 Score 0 3 0 0 15      PHQ2-9    Flowsheet Row Video Visit from 07/09/2021 in [facility] Video Visit from 06/24/2021 in [place] Office Visit from 01/07/2021 in [facility] Video Visit from 01/06/2021 in [facility] Office Visit from 04/29/2020 in [facility]  PHQ-2 Total Score 0 2 2 2  0  PHQ-9 Total Score 0 7 5 7  0      Flowsheet Row Video Visit from 06/24/2021 in [place] Video Visit from 05/24/2021 in [place] Video Visit from 01/06/2021 in [place] No Risk No Risk No Risk        Assessment and Plan:  KAHMARI HERARD is a 60 y.o. year old female with a history of depression, binge eating disorder, hypertension, hypothyroidism, s/p roux-en-y  gastric bypass in 2008, who presents for follow up appointment for below.   1. MDD (major depressive disorder), recurrent, in partial remission ([place]) 3. Anxiety state There has been overall improvement in her mood symptoms since up titration of BuSpar. Psychosocial stressors includes taking care of her sister with regarding cancer since Feb, her mother with medical issues, and conflict with her son.  She has been able to work without significant issues, and reports great support from her husband.  Will continue current dose of Pristiq to target depression and anxiety.  Will continue BuSpar to target anxiety.  Will continue lamotrigine for mood dysregulation; noted that she did not have any episode consistent with bipolar disorder.  She is willing to consider tapering this off in the medication to avoid polypharmacy.   2. Binge eating disorder She reports improvement in her appetite, and denies any episodes of binge eating.  She has strong preference to stay on this medication with concern of weight gain.  Will continue current dose to target binge eating.   4. Insomnia, unspecified type She has fair benefit from trazodone.  She has middle insomnia, although she denies snoring.  Provided psychoeducation of sleep hygiene.  Will continue current dose of trazodone to target insomnia.     Plan Continue  lamotrigine 150 mg daily Continue Pristiq 100 mg daily -[facility] Continue topiramate 200 mg daily Continue Buspar 7.5 mg twice a  day Continue trazodone 100 mg at night as needed for sleep (She takes propranolol 20 mg daily prn for anxiety, originally prescribed by Dr. [name]- she declined refill) Next appointment- 1/6 at 8 AM, video,  - on tramadol   The patient demonstrates the following risk factors for suicide: Chronic risk factors for suicide include: psychiatric disorder of depression . Acute risk factors for suicide include:  her family suffering from medical illness . Protective factors for this  patient include: positive social support, coping skills, and hope for the future. Considering these factors, the overall suicide risk at this point appears to be low. Patient is appropriate for outpatient follow up.   Norman Clay, MD 07/26/2021, 8:28 AM

## 2021-07-26 ENCOUNTER — Encounter: Payer: Self-pay | Admitting: Psychiatry

## 2021-07-26 ENCOUNTER — Other Ambulatory Visit: Payer: Self-pay

## 2021-07-26 ENCOUNTER — Encounter: Payer: Self-pay | Admitting: General Surgery

## 2021-07-26 ENCOUNTER — Telehealth (INDEPENDENT_AMBULATORY_CARE_PROVIDER_SITE_OTHER): Payer: 59 | Admitting: Psychiatry

## 2021-07-26 DIAGNOSIS — F3341 Major depressive disorder, recurrent, in partial remission: Secondary | ICD-10-CM

## 2021-07-26 DIAGNOSIS — F5081 Binge eating disorder: Secondary | ICD-10-CM | POA: Diagnosis not present

## 2021-07-26 DIAGNOSIS — G4700 Insomnia, unspecified: Secondary | ICD-10-CM

## 2021-07-26 DIAGNOSIS — F411 Generalized anxiety disorder: Secondary | ICD-10-CM | POA: Diagnosis not present

## 2021-07-26 MED ORDER — TRAZODONE HCL 100 MG PO TABS: 100.0000 mg | ORAL_TABLET | Freq: Every day | ORAL | 0 refills | Status: DC

## 2021-07-26 MED ORDER — PRISTIQ 100 MG PO TB24: 100.0000 mg | ORAL_TABLET | Freq: Every morning | ORAL | 2 refills | Status: DC

## 2021-07-26 MED ORDER — BUSPIRONE HCL 7.5 MG PO TABS: 7.5000 mg | ORAL_TABLET | Freq: Two times a day (BID) | ORAL | 0 refills | Status: AC

## 2021-07-26 MED ORDER — LAMOTRIGINE 150 MG PO TABS: 150.0000 mg | ORAL_TABLET | ORAL | 0 refills | Status: DC

## 2021-09-28 ENCOUNTER — Encounter: Payer: Self-pay | Admitting: Psychiatry

## 2021-09-28 ENCOUNTER — Telehealth (INDEPENDENT_AMBULATORY_CARE_PROVIDER_SITE_OTHER): Payer: 59 | Admitting: Psychiatry

## 2021-09-28 ENCOUNTER — Other Ambulatory Visit: Payer: Self-pay

## 2021-09-28 DIAGNOSIS — G4700 Insomnia, unspecified: Secondary | ICD-10-CM | POA: Diagnosis not present

## 2021-09-28 DIAGNOSIS — F1010 Alcohol abuse, uncomplicated: Secondary | ICD-10-CM

## 2021-09-28 DIAGNOSIS — F331 Major depressive disorder, recurrent, moderate: Secondary | ICD-10-CM | POA: Diagnosis not present

## 2021-09-28 MED ORDER — QUETIAPINE FUMARATE 25 MG PO TABS: 25.0000 mg | ORAL_TABLET | Freq: Every day | ORAL | 0 refills | Status: DC

## 2021-09-28 MED ORDER — NALTREXONE HCL 50 MG PO TABS: 25.0000 mg | ORAL_TABLET | Freq: Every day | ORAL | 0 refills | Status: DC

## 2021-09-28 NOTE — Patient Instructions (Signed)
Continue  lamotrigine 150 mg daily Continue Pristiq 100 mg daily  Continue topiramate 200 mg daily Continue Buspar 7.5 mg twice a day Start naltrexone 25 mg at night Start quetiapine 25 mg at night Continue trazodone 100 mg at night as needed for sleep Next appointment- 1/3 at 3:30, video

## 2021-09-28 NOTE — Progress Notes (Addendum)
Virtual Visit via Video Note  I connected with Toni Green on 09/28/21 at 11:00 AM EST by a video enabled telemedicine application and verified that I am speaking with the correct person using two identifiers.  Location: Patient: home Provider: office Persons participated in the visit- patient, provider    I discussed the limitations of evaluation and management by telemedicine and the availability of in person appointments. The patient expressed understanding and agreed to proceed.     I discussed the assessment and treatment plan with the patient. The patient was provided an opportunity to ask questions and all were answered. The patient agreed with the plan and demonstrated an understanding of the instructions.   The patient was advised to call back or seek an in-person evaluation if the symptoms worsen or if the condition fails to improve as anticipated.  I provided 20 minutes of non-face-to-face time during this encounter.   Norman Clay, MD    [facility] MD/PA/NP OP Progress Note  09/28/2021 11:30 AM Toni Green  MRN:  [id number]  Chief Complaint:  Chief Complaint   Follow-up; Depression    HPI:  This is a follow-up appointment for depression.  She states that she made this appointment as she is not doing well.  She lost her sister in November at age 41.  She was her best friend.  She is crying, and has insomnia.  She woke up 1 AM in the morning, and was awake until 5 AM.  Although she goes to work, she is forgetful, and it has been affecting her job.  She is concerned about this as she is the only person who works.  Although she shares her feelings with her mother, her mother told her that she needs to do well.  She tries to occupy her mind with work, although her mind tends to wander.  She was in the bed all day long on weekend.  She reports fair relationship with her husband.  She has been drinking 2-3 beers later in the afternoon.  Although she has never been a drinker,  and denies any family history, she has been doing this for the past few weeks.  She has not used the milligram is as much compared to before.  She lost several pounds since the loss of her sister due to appetite loss.  She denies SI.  She feels anxious about upcoming procedure for fistula later this month. 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  She is willing to try medication as described below.   11/29 LFT- wnl  Daily routine: Exercise: Employment: Scientist, water quality, supporting president since 2016, healthcare IT for 30 years Support: husband Household: husband, puppy Marital status: married since 1994 Number of children: 2 grown sons (one from previous marriage, another one from her husband's previous marriage), 2 grandchildren (age 22,13)  Visit Diagnosis:    ICD-10-CM   1. MDD (major depressive disorder), recurrent episode, moderate (HCC)  F33.1     2. Insomnia, unspecified type  G47.00     3. Alcohol use disorder, mild, abuse  F10.10       Past Psychiatric History: Please see initial evaluation for full details. I have reviewed the history. No updates at this time.     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   Claustrophobia    Complication of surgery 04/20/6432   Overview:  Dilated pouch found on EGD done 12/2014    Depression    History of kidney stones    Hypertension    Hypoglycemia    Hypothyroidism    Inflammation of sacroiliac joint ([place]) 08/22/2014   Pneumonia 03/2020   PONV (postoperative nausea and vomiting)    NASUEATED/BAD HEADACHES   Seizures ([place]) 1985   x1, after labor/childbirth   Thyroid disease    Wears glasses     Past Surgical History:  Procedure Laterality Date   ABDOMINAL HYSTERECTOMY  yrs ago    partial   APPENDECTOMY  2010   BACK SURGERY  10/2005   lumbar   COLONOSCOPY WITH PROPOFOL N/A 09/23/2016   Procedure: COLONOSCOPY WITH PROPOFOL;  Surgeon: Lucilla Lame, MD;  Location: [facility];  Service: Endoscopy;  Laterality: N/A;   CYSTOSCOPY     x 1   EVALUATION UNDER ANESTHESIA WITH HEMORRHOIDECTOMY N/A 07/06/2020   Procedure: EXAM UNDER ANESTHESIA WITH HEMORRHOIDECTOMY;  Surgeon: Fredirick Maudlin, MD;  Location: [facility];  Service: General;  Laterality: N/A;   GALLBLADDER SURGERY  2009   lap   GASTRIC BYPASS  2008   roux en y   MUCOSAL ADVANCEMENT FLAP N/A 12/10/2020   Procedure: [place];  Surgeon: Leighton Ruff, MD;  Location: [facility];  Service: General;  Laterality: N/A;  60 MIN   POLYPECTOMY  09/23/2016   Procedure: POLYPECTOMY;  Surgeon: Lucilla Lame, MD;  Location: [place];  Service: Endoscopy;;    Family Psychiatric History: Please see initial evaluation for full details. I have reviewed the history. No updates at this time.     Family History:  Family History  Problem Relation Age of Onset   Depression Mother    Hypertension Mother    Depression Father    Hypertension Father    Depression Sister    Breast cancer Sister 23   Hypertension Maternal Grandfather    Depression Maternal Grandmother    Hypertension Maternal Grandmother    Hypertension Paternal Grandfather    Hypertension Paternal Grandmother     Social History:  Social History   Socioeconomic History   Marital status: Married    Spouse name: Not on file   Number of children: Not on file   Years of education: Not on file   Highest education level: Not on file  Occupational History   Not on file  Tobacco Use   Smoking status: Never   Smokeless tobacco: Never  Vaping Use   Vaping Use: Never used  Substance and Sexual Activity   Alcohol use: Yes    Alcohol/week: 0.0 standard drinks    Comment: RARE   Drug use: No   Sexual activity: Not  Currently  Other Topics Concern   Not on file  Social History Narrative   Not on file   Social Determinants of Health   Financial Resource Strain: Not on file  Food Insecurity: Not on file  Transportation Needs: Not on file  Physical Activity: Not on file  Stress: Not on file  Social Connections: Not on file    Allergies:  Allergies  Allergen Reactions   Losartan Palpitations    Could feel heartbeat (strongly) in neck   Penicillins Rash   Tape Rash    Skin irritation after back surgery white  paper tape    Metabolic Disorder Labs: No results found for: HGBA1C, MPG No results found for: PROLACTIN Lab Results  Component Value Date   CHOL 232 (H) 01/07/2021   TRIG 53 01/07/2021   HDL 97 01/07/2021   CHOLHDL 2.4 01/07/2021   LDLCALC 126 (H) 01/07/2021   LDLCALC 111 (H) 01/06/2020   Lab Results  Component Value Date   TSH 1.360 01/07/2021   TSH 1.030 01/06/2020    Therapeutic Level Labs: No results found for: LITHIUM No results found for: VALPROATE No components found for:  CBMZ  Current Medications: Current Outpatient Medications  Medication Sig Dispense Refill   naltrexone (DEPADE) 50 MG tablet Take 0.5 tablets (25 mg total) by mouth at bedtime. 15 tablet 0   QUEtiapine (SEROQUEL) 25 MG tablet Take 1 tablet (25 mg total) by mouth at bedtime. 30 tablet 0   Ascorbic Acid (VITAMIN C) 1000 MG tablet Take 1,000 mg by mouth at bedtime.     busPIRone (BUSPAR) 7.5 MG tablet Take 1 tablet (7.5 mg total) by mouth 2 (two) times daily. 180 tablet 0   cetirizine (ZYRTEC) 10 MG tablet Take 10 mg by mouth as needed.     Cholecalciferol (VITAMIN D-3) 125 MCG (5000 UT) TABS Take 5,000 Units by mouth at bedtime.     Cyanocobalamin (VITAMIN B-12) 2500 MCG SUBL Place 2,500 mcg under the tongue at bedtime.     ferrous sulfate 325 (65 FE) MG tablet Take 325 mg by mouth at bedtime.     fluticasone (FLONASE) 50 MCG/ACT nasal spray Place 2 sprays into both nostrils daily. (Patient  taking differently: Place 2 sprays into both nostrils daily as needed (sinus issues/allergies.).) 16 g 6   furosemide (LASIX) 20 MG tablet TAKE 1 TABLET DAILY (Patient taking differently: Take 20 mg by mouth daily as needed (fluid retention.).) 90 tablet 3   ibuprofen (ADVIL) 800 MG tablet Take 1 tablet (800 mg total) by mouth every 8 (eight) hours as needed. 30 tablet 0   irbesartan (AVAPRO) 300 MG tablet TAKE 1 TABLET DAILY (Patient taking differently: 150 mg.) 90 tablet 3   lamoTRIgine (LAMICTAL) 150 MG tablet Take 1 tablet (150 mg total) by mouth every morning. 90 tablet 0   levothyroxine (SYNTHROID) 75 MCG tablet TAKE 1 TABLET DAILY BEFORE BREAKFAST 90 tablet 3   magnesium oxide (MAG-OX) 400 MG tablet Take 400 mg by mouth at bedtime.     PRISTIQ 100 MG 24 hr tablet Take 1 tablet (100 mg total) by mouth every morning. 30 tablet 2   promethazine-dextromethorphan (PROMETHAZINE-DM) 6.25-15 MG/5ML syrup Take 5 mLs by mouth 4 (four) times daily as needed for cough. 118 mL 0   propranolol (INDERAL) 20 MG tablet Take 1 tablet (20 mg total) by mouth 3 (three) times daily as needed (anxiety). 270 tablet 0   rOPINIRole (REQUIP) 2 MG tablet TAKE 1 TABLET AT BEDTIME (REPLACES 0.25 MG) 90 tablet 1   tamsulosin (FLOMAX) 0.4 MG CAPS capsule Take 1 capsule (0.4 mg total) by mouth daily as needed. (Patient taking differently: Take 0.4 mg by mouth daily as needed (Kidney stones).) 30 capsule 2   topiramate (TOPAMAX) 100 MG tablet TAKE 2 TABLETS AT BEDTIME 90 tablet 7   traMADol (ULTRAM) 50 MG tablet Take 25-50 mg by mouth 2 (two) times daily as needed (back pain.).      traZODone (DESYREL) 100 MG tablet Take 1 tablet (100 mg total) by mouth at bedtime. 90 tablet 0   valACYclovir (VALTREX)  1000 MG tablet as needed.     No current facility-administered medications for this visit.     Musculoskeletal: Strength & Muscle Tone:  N/A Gait & Station:  N/A Patient leans: N/A  Psychiatric Specialty Exam: Review  of Systems  Psychiatric/Behavioral:  Positive for decreased concentration, dysphoric mood and sleep disturbance. Negative for agitation, behavioral problems, confusion, hallucinations, self-injury and suicidal ideas. The patient is nervous/anxious. The patient is not hyperactive.   All other systems reviewed and are negative.  There were no vitals taken for this visit.There is no height or weight on file to calculate BMI.  General Appearance: Fairly Groomed  Eye Contact:  Good  Speech:  Clear and Coherent  Volume:  Normal  Mood:  Anxious and Depressed  Affect:  Appropriate, Congruent, and Tearful  Thought Process:  Coherent  Orientation:  Full (Time, Place, and Person)  Thought Content: Logical   Suicidal Thoughts:  No  Homicidal Thoughts:  No  Memory:  Immediate;   Good  Judgement:  Good  Insight:  Good  Psychomotor Activity:  Normal  Concentration:  Concentration: Good and Attention Span: Good  Recall:  Good  Fund of Knowledge: Good  Language: Good  Akathisia:  No  Handed:  Right  AIMS (if indicated): not done  Assets:  Communication Skills Desire for Improvement  ADL's:  Intact  Cognition: WNL  Sleep:  Poor   Screenings: GAD-7    Flowsheet Row Video Visit from 07/09/2021 in [facility] Office Visit from 01/07/2021 in [facility] Office Visit from 04/29/2020 in [facility] Office Visit from 03/24/2020 in [facility] Office Visit from 01/06/2020 in [facility]  Total GAD-7 Score 0 3 0 0 15      PHQ2-9    Flowsheet Row Video Visit from 07/09/2021 in [facility] Video Visit from 06/24/2021 in [place] Office Visit from 01/07/2021 in [facility] Video Visit from 01/06/2021 in [facility] Office Visit from 04/29/2020 in [facility]  PHQ-2 Total Score 0 2 2 2  0  PHQ-9 Total Score 0 7 5 7  0      Flowsheet Row Video Visit from 06/24/2021 in [place] Video Visit from 05/24/2021 in [facility] Video Visit from 01/06/2021 in [facility] No Risk No Risk No Risk        Assessment and Plan:  Toni Green is a 60 y.o. year old female with a history of , who presents for follow up appointment for below.     Toni Green is a 60 y.o. year old female with a history of depression, binge eating disorder, hypertension, hypothyroidism, s/p roux-en-y gastric bypass in 2008, who presents for follow up appointment for below.   1. MDD (major depressive disorder), recurrent episode, moderate ([place]) 2. Insomnia, unspecified type There has been significant worsening in depressive symptoms in the context of loss of her sister in November.  Other psychosocial stressors includes her mother with medical issues, and conflict with her son.  Will start quetiapine as adjunctive treatment for depression and also to target insomnia, anxiety and an appetite loss.  Discussed potential metabolic side effect and EPS.  Will continue Pristiq to target depression and anxiety.  Will continue BuSpar to target anxiety.  Will continue lamotrigine for mood dysregulation; noted that she did not have any episode consistent with bipolar disorder.  Will continue to  taper it off in the future as her mood improves.  Will continue trazodone as needed for insomnia.  Although she will greatly benefit from supportive therapy/CBT, she would like to hold this at this time.    2. Binge eating disorder She has been on topiramate for a binge eating.  We will continue current dose at this time to target this.  Noted that she is recently started on quetiapine due to significant weight loss; will consider using this medication only for short-term to avoid long-term side effect.    Plan Continue  lamotrigine 150 mg daily Continue Pristiq 100 mg daily -[facility] Continue topiramate 200 mg  daily Continue Buspar 7.5 mg twice a day Start naltrexone 25 mg at night Start quetiapine 25 mg at night Continue trazodone 100 mg at night as needed for sleep (She takes propranolol 20 mg daily prn for anxiety, originally prescribed by Dr. [name]- she declined refill) Next appointment- 1/3 at 3:30 for 30 mins, video - on tramadol   The patient demonstrates the following risk factors for suicide: Chronic risk factors for suicide include: psychiatric disorder of depression . Acute risk factors for suicide include:  her family suffering from medical illness . Protective factors for this patient include: positive social support, coping skills, and hope for the future. Considering these factors, the overall suicide risk at this point appears to be low. Patient is appropriate for outpatient follow up.       Naltrexone 25 mg daily   Norman Clay, MD 09/28/2021, 11:30 AM

## 2021-10-18 NOTE — Progress Notes (Deleted)
BH MD/PA/NP OP Progress Note  10/18/2021 10:21 AM Toni Green  MRN:  [id number]  Chief Complaint:  HPI: *** Visit Diagnosis: No diagnosis found.  Past Psychiatric History: Please see initial evaluation for full details. I have reviewed the history. No updates at this time.     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   Claustrophobia    Complication of surgery 12/23/4663   Overview:  Dilated pouch found on EGD done 12/2014    Depression    History of kidney stones    Hypertension    Hypoglycemia    Hypothyroidism    Inflammation of sacroiliac joint ([place]) 08/22/2014   Pneumonia 03/2020   PONV (postoperative nausea and vomiting)    NASUEATED/BAD HEADACHES   Seizures ([place]) 1985   x1, after labor/childbirth   Thyroid disease    Wears glasses     Past Surgical History:  Procedure Laterality Date   ABDOMINAL HYSTERECTOMY  yrs ago   partial   APPENDECTOMY  2010   BACK SURGERY  10/2005   lumbar   COLONOSCOPY WITH PROPOFOL N/A 09/23/2016   Procedure: COLONOSCOPY WITH PROPOFOL;  Surgeon: Lucilla Lame, MD;  Location: [facility];  Service: Endoscopy;  Laterality: N/A;   CYSTOSCOPY     x 1   EVALUATION UNDER ANESTHESIA WITH HEMORRHOIDECTOMY N/A 07/06/2020   Procedure: EXAM UNDER ANESTHESIA WITH HEMORRHOIDECTOMY;  Surgeon: Fredirick Maudlin, MD;  Location: [facility];  Service: General;  Laterality: N/A;   GALLBLADDER SURGERY  2009   lap   GASTRIC BYPASS  2008   roux en y   MUCOSAL ADVANCEMENT FLAP N/A 12/10/2020   Procedure: [place];  Surgeon: Leighton Ruff, MD;  Location: [facility];  Service: General;  Laterality: N/A;  60 MIN   POLYPECTOMY  09/23/2016   Procedure: POLYPECTOMY;  Surgeon: Lucilla Lame, MD;  Location: [facility];  Service: Endoscopy;;    Family Psychiatric History: Please see initial evaluation for full details. I have reviewed the history. No updates at this time.     Family History:  Family History  Problem Relation Age of Onset   Depression Mother    Hypertension Mother    Depression Father    Hypertension Father    Depression Sister    Breast cancer Sister 3   Hypertension Maternal Grandfather    Depression Maternal Grandmother    Hypertension Maternal Grandmother    Hypertension Paternal Grandfather    Hypertension Paternal Grandmother     Social History:  Social History   Socioeconomic History   Marital status: Married    Spouse name: Not on file   Number of children: Not on file   Years of education: Not on file   Highest education level: Not on file  Occupational History   Not on file  Tobacco Use   Smoking status: Never   Smokeless tobacco: Never  Vaping Use   Vaping Use: Never used  Substance and Sexual Activity   Alcohol use: Yes    Alcohol/week: 0.0 standard drinks    Comment: RARE   Drug use: No   Sexual activity: Not Currently  Other Topics Concern   Not on file  Social History Narrative   Not on file   Social Determinants of Health   Financial Resource Strain: Not on file  Food Insecurity: Not on file  Transportation Needs: Not on file  Physical Activity: Not on file  Stress: Not on file  Social Connections: Not on file    Allergies:  Allergies  Allergen Reactions   Losartan Palpitations    Could feel heartbeat (strongly) in neck   Penicillins Rash   Tape Rash    Skin irritation after back surgery white paper tape    Metabolic Disorder Labs: No results found for: HGBA1C, MPG No results found for: PROLACTIN Lab Results  Component Value Date   CHOL 232 (H) 01/07/2021   TRIG 53 01/07/2021   HDL 97 01/07/2021   CHOLHDL 2.4 01/07/2021   LDLCALC 126 (H) 01/07/2021   LDLCALC 111 (H) 01/06/2020   Lab Results  Component  Value Date   TSH 1.360 01/07/2021   TSH 1.030 01/06/2020    Therapeutic Level Labs: No results found for: LITHIUM No results found for: VALPROATE No components found for:  CBMZ  Current Medications: Current Outpatient Medications  Medication Sig Dispense Refill   Ascorbic Acid (VITAMIN C) 1000 MG tablet Take 1,000 mg by mouth at bedtime.     busPIRone (BUSPAR) 7.5 MG tablet Take 1 tablet (7.5 mg total) by mouth 2 (two) times daily. 180 tablet 0   cetirizine (ZYRTEC) 10 MG tablet Take 10 mg by mouth as needed.     Cholecalciferol (VITAMIN D-3) 125 MCG (5000 UT) TABS Take 5,000 Units by mouth at bedtime.     Cyanocobalamin (VITAMIN B-12) 2500 MCG SUBL Place 2,500 mcg under the tongue at bedtime.     ferrous sulfate 325 (65 FE) MG tablet Take 325 mg by mouth at bedtime.     fluticasone (FLONASE) 50 MCG/ACT nasal spray Place 2 sprays into both nostrils daily. (Patient taking differently: Place 2 sprays into both nostrils daily as needed (sinus issues/allergies.).) 16 g 6   furosemide (LASIX) 20 MG tablet TAKE 1 TABLET DAILY (Patient taking differently: Take 20 mg by mouth daily as needed (fluid retention.).) 90 tablet 3   ibuprofen (ADVIL) 800 MG tablet Take 1 tablet (800 mg total) by mouth every 8 (eight) hours as needed. 30 tablet 0   irbesartan (AVAPRO) 300 MG tablet TAKE 1 TABLET DAILY (Patient taking differently: 150 mg.) 90 tablet 3   lamoTRIgine (LAMICTAL) 150 MG tablet Take 1 tablet (150 mg total) by mouth every morning. 90 tablet 0   levothyroxine (SYNTHROID) 75 MCG tablet TAKE 1 TABLET DAILY BEFORE BREAKFAST 90 tablet 3   magnesium oxide (MAG-OX) 400 MG tablet Take 400 mg by mouth at bedtime.     naltrexone (DEPADE) 50 MG tablet Take 0.5 tablets (25 mg total) by mouth at bedtime. 15 tablet 0   PRISTIQ 100 MG 24 hr tablet Take 1 tablet (100 mg total) by mouth every morning. 30 tablet 2   promethazine-dextromethorphan (PROMETHAZINE-DM) 6.25-15 MG/5ML syrup Take 5 mLs by mouth 4 (four)  times daily as needed for cough. 118 mL 0   propranolol (INDERAL) 20 MG tablet Take 1 tablet (20 mg total) by mouth 3 (three) times daily as needed (anxiety). 270 tablet 0   QUEtiapine (SEROQUEL) 25 MG tablet Take 1 tablet (25 mg total) by mouth at bedtime. 30 tablet 0   rOPINIRole (REQUIP) 2 MG tablet TAKE 1 TABLET AT BEDTIME (REPLACES 0.25 MG) 90 tablet 1   tamsulosin (FLOMAX) 0.4 MG  CAPS capsule Take 1 capsule (0.4 mg total) by mouth daily as needed. (Patient taking differently: Take 0.4 mg by mouth daily as needed (Kidney stones).) 30 capsule 2   topiramate (TOPAMAX) 100 MG tablet TAKE 2 TABLETS AT BEDTIME 90 tablet 7   traMADol (ULTRAM) 50 MG tablet Take 25-50 mg by mouth 2 (two) times daily as needed (back pain.).      traZODone (DESYREL) 100 MG tablet Take 1 tablet (100 mg total) by mouth at bedtime. 90 tablet 0   valACYclovir (VALTREX) 1000 MG tablet as needed.     No current facility-administered medications for this visit.     Musculoskeletal: Strength & Muscle Tone:  N/A Gait & Station:  N/A Patient leans: N/A  Psychiatric Specialty Exam: Review of Systems  There were no vitals taken for this visit.There is no height or weight on file to calculate BMI.  General Appearance: {Appearance:22683}  Eye Contact:  {BHH EYE CONTACT:22684}  Speech:  Clear and Coherent  Volume:  Normal  Mood:  {BHH MOOD:22306}  Affect:  {Affect (PAA):22687}  Thought Process:  Coherent  Orientation:  Full (Time, Place, and Person)  Thought Content: Logical   Suicidal Thoughts:  {ST/HT (PAA):22692}  Homicidal Thoughts:  {ST/HT (PAA):22692}  Memory:  Immediate;   Good  Judgement:  {Judgement (PAA):22694}  Insight:  {Insight (PAA):22695}  Psychomotor Activity:  Normal  Concentration:  Concentration: Good and Attention Span: Good  Recall:  Good  Fund of Knowledge: Good  Language: Good  Akathisia:  No  Handed:  Right  AIMS (if indicated): not done  Assets:  Communication Skills Desire for  Improvement  ADL's:  Intact  Cognition: WNL  Sleep:  {BHH GOOD/FAIR/POOR:22877}   Screenings: GAD-7    Flowsheet Row Video Visit from 07/09/2021 in [facility] Office Visit from 01/07/2021 in [facility] Office Visit from 04/29/2020 in [facility] Office Visit from 03/24/2020 in [facility] Office Visit from 01/06/2020 in [facility]  Total GAD-7 Score 0 3 0 0 15      PHQ2-9    Flowsheet Row Video Visit from 07/09/2021 in [facility] Video Visit from 06/24/2021 in [facility] Office Visit from 01/07/2021 in [facility] Video Visit from 01/06/2021 in [facility] Office Visit from 04/29/2020 in [facility]  PHQ-2 Total Score 0 2 2 2  0  PHQ-9 Total Score 0 7 5 7  0      Flowsheet Row Video Visit from 06/24/2021 in [facility] Video Visit from 05/24/2021 in [place] Video Visit from 01/06/2021 in [facility] No Risk No Risk No Risk        Assessment and Plan:  Toni Green is a 61 y.o. year old female with a history of depression, binge eating disorder, hypertension, hypothyroidism, s/p roux-en-y gastric bypass in 2008, who presents for follow up appointment for below.   1. MDD (major depressive disorder), recurrent episode, moderate ([place]) 2. Insomnia, unspecified type There has been significant worsening in depressive symptoms in the context of loss of her sister in November.  Other psychosocial stressors includes her mother with medical issues, and conflict with her son.  Will start quetiapine as adjunctive treatment for depression and also to target insomnia, anxiety and an appetite loss.  Discussed potential metabolic side effect and EPS.  Will continue Pristiq to target depression and anxiety.  Will continue BuSpar to  target anxiety.  Will continue  lamotrigine for mood dysregulation; noted that she did not have any episode consistent with bipolar disorder.  Will continue to taper it off in the future as her mood improves.  Will continue trazodone as needed for insomnia.  Although she will greatly benefit from supportive therapy/CBT, she would like to hold this at this time.    2. Binge eating disorder She has been on topiramate for a binge eating.  We will continue current dose at this time to target this.  Noted that she is recently started on quetiapine due to significant weight loss; will consider using this medication only for short-term to avoid long-term side effect.    Plan Continue  lamotrigine 150 mg daily Continue Pristiq 100 mg daily -[facility] Continue topiramate 200 mg daily Continue Buspar 7.5 mg twice a day Start naltrexone 25 mg at night Start quetiapine 25 mg at night Continue trazodone 100 mg at night as needed for sleep (She takes propranolol 20 mg daily prn for anxiety, originally prescribed by Dr. [name]- she declined refill) Next appointment- 1/3 at 3:30 for 30 mins, video - on tramadol   The patient demonstrates the following risk factors for suicide: Chronic risk factors for suicide include: psychiatric disorder of depression . Acute risk factors for suicide include:  her family suffering from medical illness . Protective factors for this patient include: positive social support, coping skills, and hope for the future. Considering these factors, the overall suicide risk at this point appears to be low. Patient is appropriate for outpatient follow up.         Norman Clay, MD 10/18/2021, 10:21 AM

## 2021-10-19 ENCOUNTER — Other Ambulatory Visit: Payer: Self-pay

## 2021-10-19 ENCOUNTER — Telehealth: Payer: Self-pay | Admitting: Psychiatry

## 2021-10-19 ENCOUNTER — Telehealth: Payer: 59 | Admitting: Psychiatry

## 2021-10-19 NOTE — Telephone Encounter (Signed)
Sent link for video visit through [place]. Patient did not sign in. Called the patient for appointment scheduled today. She is currently admitted for procedure tomorrow. She would like to reschedule the appointment. 1/23 at 2:30.

## 2021-10-22 ENCOUNTER — Telehealth: Payer: 59 | Admitting: Psychiatry

## 2021-10-22 HISTORY — PX: COLOSTOMY: SHX63

## 2021-10-30 ENCOUNTER — Other Ambulatory Visit: Payer: Self-pay | Admitting: Psychiatry

## 2021-10-31 ENCOUNTER — Other Ambulatory Visit: Payer: Self-pay | Admitting: Psychiatry

## 2021-11-01 ENCOUNTER — Telehealth: Payer: Self-pay | Admitting: *Deleted

## 2021-11-01 ENCOUNTER — Encounter: Payer: Self-pay | Admitting: Internal Medicine

## 2021-11-01 ENCOUNTER — Other Ambulatory Visit: Payer: Self-pay

## 2021-11-01 ENCOUNTER — Other Ambulatory Visit: Payer: Self-pay | Admitting: Psychiatry

## 2021-11-01 DIAGNOSIS — E033 Postinfectious hypothyroidism: Secondary | ICD-10-CM

## 2021-11-01 DIAGNOSIS — G2581 Restless legs syndrome: Secondary | ICD-10-CM

## 2021-11-01 DIAGNOSIS — I10 Essential (primary) hypertension: Secondary | ICD-10-CM

## 2021-11-01 DIAGNOSIS — F5081 Binge eating disorder: Secondary | ICD-10-CM

## 2021-11-01 MED ORDER — IRBESARTAN 150 MG PO TABS: 300.0000 mg | ORAL_TABLET | Freq: Every day | ORAL | 0 refills | Status: DC

## 2021-11-01 MED ORDER — LEVOTHYROXINE SODIUM 75 MCG PO TABS: 75.0000 ug | ORAL_TABLET | Freq: Every day | ORAL | 0 refills | Status: DC

## 2021-11-01 MED ORDER — TOPIRAMATE 100 MG PO TABS: 200.0000 mg | ORAL_TABLET | Freq: Every day | ORAL | 0 refills | Status: DC

## 2021-11-01 MED ORDER — ROPINIROLE HCL 2 MG PO TABS: ORAL_TABLET | ORAL | 0 refills | Status: DC

## 2021-11-01 NOTE — Telephone Encounter (Signed)
Fax received from [facility] for refills for patient.   Refills they are requesting are:  Topiramate Tab Quetiapine Tab Lamotrigine Tab  [facility]:  phone number is [phone] Fax number is [phone]

## 2021-11-01 NOTE — Telephone Encounter (Signed)
Decline- trazodone (she should have enough), Pristiq (she asked it to be sent to local pharmacy), propranolol (she declined a refill)

## 2021-11-01 NOTE — Telephone Encounter (Signed)
Ordered topiramate only at this time. May change the dose of other medications at the next visit and patient should have enough meds to last until the next visit.

## 2021-11-01 NOTE — Telephone Encounter (Signed)
[facility] requesting refills for patient Trazodone, Pristiq and propranolol.   [facility] Order number is [id number] and fax number is [phone]

## 2021-11-03 ENCOUNTER — Telehealth: Payer: Self-pay | Admitting: *Deleted

## 2021-11-03 ENCOUNTER — Other Ambulatory Visit: Payer: Self-pay | Admitting: Psychiatry

## 2021-11-03 DIAGNOSIS — F411 Generalized anxiety disorder: Secondary | ICD-10-CM

## 2021-11-03 MED ORDER — PROPRANOLOL HCL 20 MG PO TABS: 20.0000 mg | ORAL_TABLET | Freq: Every day | ORAL | 0 refills | Status: DC | PRN

## 2021-11-03 NOTE — Telephone Encounter (Signed)
[facility] faxed refill request for the following med's: 1) traZODone (DESYREL) 100 MG tablet  2) PRISTIQ 100 MG 24 hr tablet  3) propranolol (INDERAL) 20 MG tablet

## 2021-11-03 NOTE — Telephone Encounter (Signed)
Thanks. Ordered propranolol only at this time based on her request.

## 2021-11-03 NOTE — Telephone Encounter (Signed)
jUST SPOKE WITH PATIENT  & SHE STATED THAT HER INSURANCE HAS CHANGED &[facility] IS PHARMACY OF CHOICE. SHE ALSO REQUESTED THAT HER PRISTIQ , SEROQUEL & NALTREXONE CONTINUE TO GO [facility] IN [place] UNTIL IT'S BEEN DECIDED IF SHE WILL CONTINUE THOSE MED'S.

## 2021-11-03 NOTE — Telephone Encounter (Signed)
Could you contact the patient and ask if she does indeed these medication? Those were declined yesterday due to the following reasons:  trazodone (she should have enough), Pristiq (she asked it to be sent to local pharmacy), propranolol (she declined a refill).

## 2021-11-05 NOTE — Progress Notes (Signed)
Virtual Visit via Video Note  I connected with Toni Green on 11/08/21 at  2:30 PM EST by a video enabled telemedicine application and verified that I am speaking with the correct person using two identifiers.  Location: Patient: home Provider: office Persons participated in the visit- patient, provider    I discussed the limitations of evaluation and management by telemedicine and the availability of in person appointments. The patient expressed understanding and agreed to proceed.    I discussed the assessment and treatment plan with the patient. The patient was provided an opportunity to ask questions and all were answered. The patient agreed with the plan and demonstrated an understanding of the instructions.   The patient was advised to call back or seek an in-person evaluation if the symptoms worsen or if the condition fails to improve as anticipated.  I provided 15 minutes of non-face-to-face time during this encounter.   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 She states that a lot has happened.  She had a surgery for endovaginal fistula.  She was then found to have infection, and had 5 surgeries since that.  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  She was told that she has it only temporarily.  She has been feeling very emotional.  She feels anxious, and shaking.  She was unable to work on computer when she is trying to do school due to difficulty in concentration.  She is planning to take short-term disability as she would not be able to work.  She has insomnia, which she attributes to sleep position as she usually sleeps with her stomach down.  She feels fatigue.  She denies SI.  She has  not drunk any alcohol since surgery in December, and denies any craving for alcohol as this is not her priority.  She occasionally uses CBD gummy.  Although she has not noticed any difference since starting quetiapine, she feels comfortable to stay on this medication at this time due to a lot of changes/stress since the last visit.    Employment: Scientist, water quality, supporting president since 2016, healthcare IT for 30 years Support: husband Household: husband, puppy Marital status: married since 1994 Number of children: 2 grown sons (one from previous marriage, another one from her husband's previous marriage), 2 grandchildren (age 32,13)  Visit Diagnosis:    ICD-10-CM   1. MDD (major depressive disorder), recurrent episode, moderate (HCC)  F33.1     2. Insomnia, unspecified type  G47.00     3. Binge eating disorder  F50.81       Past Psychiatric History: Please see initial evaluation for full details. I have reviewed the history. No updates at this time.     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   Claustrophobia    Complication of surgery 02/15/7615   Overview:  Dilated pouch found on EGD done 12/2014    Depression  History of kidney stones    Hypertension    Hypoglycemia    Hypothyroidism    Inflammation of sacroiliac joint ([place]) 08/22/2014   Pneumonia 03/2020   PONV (postoperative nausea and vomiting)    NASUEATED/BAD HEADACHES   Seizures ([place]) 1985   x1, after labor/childbirth   Thyroid disease    Wears glasses     Past Surgical History:  Procedure Laterality Date   ABDOMINAL HYSTERECTOMY  yrs ago   partial   APPENDECTOMY  2010   BACK SURGERY  10/2005   lumbar   COLONOSCOPY WITH PROPOFOL N/A 09/23/2016   Procedure: COLONOSCOPY WITH PROPOFOL;   Surgeon: Lucilla Lame, MD;  Location: [place];  Service: Endoscopy;  Laterality: N/A;   CYSTOSCOPY     x 1   EVALUATION UNDER ANESTHESIA WITH HEMORRHOIDECTOMY N/A 07/06/2020   Procedure: EXAM UNDER ANESTHESIA WITH HEMORRHOIDECTOMY;  Surgeon: Fredirick Maudlin, MD;  Location: [facility];  Service: General;  Laterality: N/A;   GALLBLADDER SURGERY  2009   lap   GASTRIC BYPASS  2008   roux en y   MUCOSAL ADVANCEMENT FLAP N/A 12/10/2020   Procedure: [place];  Surgeon: Leighton Ruff, MD;  Location: [facility];  Service: General;  Laterality: N/A;  60 MIN   POLYPECTOMY  09/23/2016   Procedure: POLYPECTOMY;  Surgeon: Lucilla Lame, MD;  Location: [place];  Service: Endoscopy;;    Family Psychiatric History: Please see initial evaluation for full details. I have reviewed the history. No updates at this time.     Family History:  Family History  Problem Relation Age of Onset   Depression Mother    Hypertension Mother    Depression Father    Hypertension Father    Depression Sister    Breast cancer Sister 17   Hypertension Maternal Grandfather    Depression Maternal Grandmother    Hypertension Maternal Grandmother    Hypertension Paternal Grandfather    Hypertension Paternal Grandmother     Social History:  Social History   Socioeconomic History   Marital status: Married    Spouse name: Not on file   Number of children: Not on file   Years of education: Not on file   Highest education level: Not on file  Occupational History   Not on file  Tobacco Use   Smoking status: Never   Smokeless tobacco: Never  Vaping Use   Vaping Use: Never used  Substance and Sexual Activity   Alcohol use: Yes    Alcohol/week: 0.0 standard drinks    Comment: RARE   Drug use: No   Sexual activity: Not Currently  Other Topics Concern   Not on file  Social History Narrative   Not on file   Social Determinants of Health   Financial Resource  Strain: Not on file  Food Insecurity: Not on file  Transportation Needs: Not on file  Physical Activity: Not on file  Stress: Not on file  Social Connections: Not on file    Allergies:  Allergies  Allergen Reactions   Losartan Palpitations    Could feel heartbeat (strongly) in neck   Penicillins Rash   Tape Rash    Skin irritation after back surgery white paper tape    Metabolic Disorder Labs: No results found for: HGBA1C, MPG No results found for: PROLACTIN Lab Results  Component Value Date   CHOL 232 (H) 01/07/2021   TRIG 53 01/07/2021   HDL 97 01/07/2021   CHOLHDL 2.4 01/07/2021  Lorain 126 (H) 01/07/2021   LDLCALC 111 (H) 01/06/2020   Lab Results  Component Value Date   TSH 1.360 01/07/2021   TSH 1.030 01/06/2020    Therapeutic Level Labs: No results found for: LITHIUM No results found for: VALPROATE No components found for:  CBMZ  Current Medications: Current Outpatient Medications  Medication Sig Dispense Refill   Ascorbic Acid (VITAMIN C) 1000 MG tablet Take 1,000 mg by mouth at bedtime.     cetirizine (ZYRTEC) 10 MG tablet Take 10 mg by mouth as needed.     Cholecalciferol (VITAMIN D-3) 125 MCG (5000 UT) TABS Take 5,000 Units by mouth at bedtime.     Cyanocobalamin (VITAMIN B-12) 2500 MCG SUBL Place 2,500 mcg under the tongue at bedtime.     ferrous sulfate 325 (65 FE) MG tablet Take 325 mg by mouth at bedtime.     fluticasone (FLONASE) 50 MCG/ACT nasal spray Place 2 sprays into both nostrils daily. (Patient taking differently: Place 2 sprays into both nostrils daily as needed (sinus issues/allergies.).) 16 g 6   furosemide (LASIX) 20 MG tablet TAKE 1 TABLET DAILY (Patient taking differently: Take 20 mg by mouth daily as needed (fluid retention.).) 90 tablet 3   ibuprofen (ADVIL) 800 MG tablet Take 1 tablet (800 mg total) by mouth every 8 (eight) hours as needed. 30 tablet 0   irbesartan (AVAPRO) 150 MG tablet Take 2 tablets (300 mg total) by mouth daily.  90 tablet 0   lamoTRIgine (LAMICTAL) 150 MG tablet Take 1 tablet (150 mg total) by mouth every morning. 90 tablet 0   levothyroxine (SYNTHROID) 75 MCG tablet Take 1 tablet (75 mcg total) by mouth daily before breakfast. 90 tablet 0   magnesium oxide (MAG-OX) 400 MG tablet Take 400 mg by mouth at bedtime.     naltrexone (DEPADE) 50 MG tablet Take 0.5 tablets (25 mg total) by mouth at bedtime. 15 tablet 0   PRISTIQ 100 MG 24 hr tablet Take 1 tablet (100 mg total) by mouth every morning. 30 tablet 2   propranolol (INDERAL) 20 MG tablet Take 1 tablet (20 mg total) by mouth daily as needed (anxiety). 90 tablet 0   QUEtiapine (SEROQUEL) 25 MG tablet Take 1 tablet (25 mg total) by mouth at bedtime. 30 tablet 0   rOPINIRole (REQUIP) 2 MG tablet TAKE 1 TABLET AT BEDTIME (REPLACES 0.25 MG) 90 tablet 0   tamsulosin (FLOMAX) 0.4 MG CAPS capsule Take 1 capsule (0.4 mg total) by mouth daily as needed. (Patient taking differently: Take 0.4 mg by mouth daily as needed (Kidney stones).) 30 capsule 2   topiramate (TOPAMAX) 100 MG tablet Take 2 tablets (200 mg total) by mouth at bedtime. 180 tablet 0   traMADol (ULTRAM) 50 MG tablet Take 25-50 mg by mouth 2 (two) times daily as needed (back pain.).      traZODone (DESYREL) 100 MG tablet Take 1 tablet (100 mg total) by mouth at bedtime. 90 tablet 0   valACYclovir (VALTREX) 1000 MG tablet as needed.     No current facility-administered medications for this visit.     Musculoskeletal: Strength & Muscle Tone:  N/A Gait & Station:  N/A Patient leans: N/A  Psychiatric Specialty Exam: Review of Systems  Psychiatric/Behavioral:  Positive for decreased concentration, dysphoric mood and sleep disturbance. Negative for agitation, behavioral problems, confusion, hallucinations, self-injury and suicidal ideas. The patient is nervous/anxious. The patient is not hyperactive.   All other systems reviewed and are negative.  There were  no vitals taken for this visit.There is  no height or weight on file to calculate BMI.  General Appearance: Fairly Groomed  Eye Contact:  Good  Speech:  Clear and Coherent  Volume:  Normal  Mood:  Anxious  Affect:  Appropriate, Congruent, and Restricted  Thought Process:  Coherent  Orientation:  Full (Time, Place, and Person)  Thought Content: Logical   Suicidal Thoughts:  No  Homicidal Thoughts:  No  Memory:  Immediate;   Good  Judgement:  Good  Insight:  Good  Psychomotor Activity:  Normal  Concentration:  Concentration: Good and Attention Span: Good  Recall:  Good  Fund of Knowledge: Good  Language: Good  Akathisia:  No  Handed:  Right  AIMS (if indicated): not done  Assets:  Communication Skills Desire for Improvement  ADL's:  Intact  Cognition: WNL  Sleep:  Poor   Screenings: GAD-7    Flowsheet Row Video Visit from 07/09/2021 in [facility] Office Visit from 01/07/2021 in [facility] Office Visit from 04/29/2020 in [facility] Office Visit from 03/24/2020 in [facility] Office Visit from 01/06/2020 in [facility]  Total GAD-7 Score 0 3 0 0 15      PHQ2-9    Flowsheet Row Video Visit from 07/09/2021 in [facility] Video Visit from 06/24/2021 in [place] Office Visit from 01/07/2021 in [facility] Video Visit from 01/06/2021 in [facility] Office Visit from 04/29/2020 in [facility]  PHQ-2 Total Score 0 2 2 2  0  PHQ-9 Total Score 0 7 5 7  0      Flowsheet Row Video Visit from 06/24/2021 in [place] Video Visit from 05/24/2021 in [place] Video Visit from 01/06/2021 in [place] No Risk No Risk No Risk        Assessment and Plan:  ALIANIS TRIMMER is a 62 y.o. year old female with a history of depression, binge eating disorder, hypertension, hypothyroidism, s/p  roux-en-y gastric bypass in 2008, who presents for follow up appointment for below.   1. MDD (major depressive disorder), recurrent episode, moderate (HCC) There has been significant worsening in depressive symptoms and anxiety in the setting of recent complication from surgery.  Other psychosocial stressors includes loss of her sister in November, her mother with medical issues, and conflict with her son.  We continue current medication regimen given it is difficult to discern whether her current mood symptoms are more situational or any worsening in her underlying condition.  Will continue Pristiq to target depression.  Will continue BuSpar for anxiety.  Will continue quetiapine adjunctive treatment for depression.  We will plan to use this only for short-term given her history of binge eating disorder as described below. will continue lamotrigine for mood dysregulation at this time; noted that she does not have any episode consistent with bipolar disorder.  Will consider tapering it off in the future when her mood improves.  Will continue propranolol as needed at this time; will consider discontinuation of this medication in the near future to avoid polypharmacy. Although she will greatly benefit from supportive therapy/CBT, she would like to hold this at this time.   2. Insomnia, unspecified type Worsening due to her recent physical condition of colostomy.  Will continue current dose of trazodone as needed for insomnia.   # alcohol use She denies any alcohol use/craving since surgery.  Will discontinue naltrexone at this time and continue motivational interview.   3. Binge eating disorder Unchanged.  She has been on topiramate for binge eating.  Will continue current dose at this time to target this.   Plan Continue Pristiq 100 mg daily -[facility] Continue Buspar 7.5 mg twice a day Continue quetiapine 25 mg at night Continue propranolol 20 mg daily prn for anxiety Continue  lamotrigine 150 mg  daily Continue topiramate 200 mg daily Discontinue naltrexone Continue trazodone 100 mg at night as needed for sleep Next appointment- 2.21 at 2 PM for 30 mins, video - on tramadol   The patient demonstrates the following risk factors for suicide: Chronic risk factors for suicide include: psychiatric disorder of depression . Acute risk factors for suicide include:  her family suffering from medical illness . Protective factors for this patient include: positive social support, coping skills, and hope for the future. Considering these factors, the overall suicide risk at this point appears to be low. Patient is appropriate for outpatient follow up.       Toni Clay, MD 11/08/2021, 3:09 PM

## 2021-11-08 ENCOUNTER — Encounter: Payer: Self-pay | Admitting: Psychiatry

## 2021-11-08 ENCOUNTER — Telehealth (INDEPENDENT_AMBULATORY_CARE_PROVIDER_SITE_OTHER): Payer: 59 | Admitting: Psychiatry

## 2021-11-08 ENCOUNTER — Other Ambulatory Visit: Payer: Self-pay

## 2021-11-08 DIAGNOSIS — F5081 Binge eating disorder: Secondary | ICD-10-CM

## 2021-11-08 DIAGNOSIS — G4700 Insomnia, unspecified: Secondary | ICD-10-CM | POA: Diagnosis not present

## 2021-11-08 DIAGNOSIS — F331 Major depressive disorder, recurrent, moderate: Secondary | ICD-10-CM

## 2021-11-08 MED ORDER — DESVENLAFAXINE SUCCINATE ER 100 MG PO TB24: 100.0000 mg | ORAL_TABLET | Freq: Every morning | ORAL | 2 refills | Status: DC

## 2021-11-18 ENCOUNTER — Other Ambulatory Visit: Payer: Self-pay | Admitting: Psychiatry

## 2021-11-18 ENCOUNTER — Telehealth: Payer: Self-pay

## 2021-11-18 DIAGNOSIS — F3341 Major depressive disorder, recurrent, in partial remission: Secondary | ICD-10-CM

## 2021-11-18 MED ORDER — TRAZODONE HCL 100 MG PO TABS: 100.0000 mg | ORAL_TABLET | Freq: Every day | ORAL | 1 refills | Status: DC

## 2021-11-18 NOTE — Telephone Encounter (Signed)
received fax [facility] a rx for the trazodone

## 2021-11-18 NOTE — Telephone Encounter (Signed)
Ordered

## 2021-11-23 ENCOUNTER — Other Ambulatory Visit: Payer: Self-pay | Admitting: Psychiatry

## 2021-11-23 ENCOUNTER — Telehealth: Payer: Self-pay

## 2021-11-23 DIAGNOSIS — F3341 Major depressive disorder, recurrent, in partial remission: Secondary | ICD-10-CM

## 2021-11-23 MED ORDER — QUETIAPINE FUMARATE 25 MG PO TABS: 25.0000 mg | ORAL_TABLET | Freq: Every day | ORAL | 0 refills | Status: DC

## 2021-11-23 MED ORDER — LAMOTRIGINE 150 MG PO TABS: 150.0000 mg | ORAL_TABLET | ORAL | 0 refills | Status: DC

## 2021-11-23 NOTE — Telephone Encounter (Signed)
Ordered lamotrigine 90 day only. Will not do quetiapine as we may change the dose, and she does not want to use that pharmacy for [place].

## 2021-11-23 NOTE — Telephone Encounter (Signed)
received fax requesting a refill on the quetiapine, pristiq and the lamotrigine  90 day supply

## 2021-12-01 ENCOUNTER — Other Ambulatory Visit: Payer: Self-pay | Admitting: Psychiatry

## 2021-12-03 NOTE — Progress Notes (Unsigned)
[place] MD/PA/NP OP Progress Note  12/03/2021 12:00 PM Toni Green  MRN:  [id number]  Chief Complaint: No chief complaint on file.  HPI: *** Visit Diagnosis: No diagnosis found.  Past Psychiatric History: Please see initial evaluation for full details. I have reviewed the history. No updates at this time.     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   Claustrophobia    Complication of surgery 10/20/4816   Overview:  Dilated pouch found on EGD done 12/2014    Depression    History of kidney stones    Hypertension    Hypoglycemia    Hypothyroidism    Inflammation of sacroiliac joint ([place]) 08/22/2014   Pneumonia 03/2020   PONV (postoperative nausea and vomiting)    NASUEATED/BAD HEADACHES   Seizures ([place]) 1985   x1, after labor/childbirth   Thyroid disease    Wears glasses     Past Surgical History:  Procedure Laterality Date   ABDOMINAL HYSTERECTOMY  yrs ago   partial   APPENDECTOMY  2010   BACK SURGERY  10/2005   lumbar   COLONOSCOPY WITH PROPOFOL N/A 09/23/2016   Procedure: COLONOSCOPY WITH PROPOFOL;  Surgeon: Lucilla Lame, MD;  Location: [place];  Service: Endoscopy;  Laterality: N/A;   CYSTOSCOPY     x 1   EVALUATION UNDER ANESTHESIA WITH HEMORRHOIDECTOMY N/A 07/06/2020   Procedure: EXAM UNDER ANESTHESIA WITH HEMORRHOIDECTOMY;  Surgeon: Fredirick Maudlin, MD;  Location: [facility];  Service: General;  Laterality: N/A;   GALLBLADDER SURGERY  2009   lap   GASTRIC BYPASS  2008   roux en y   MUCOSAL ADVANCEMENT FLAP N/A 12/10/2020   Procedure: [place];  Surgeon: Leighton Ruff, MD;  Location: [facility];  Service: General;  Laterality: N/A;  60 MIN   POLYPECTOMY  09/23/2016   Procedure: POLYPECTOMY;  Surgeon:  Lucilla Lame, MD;  Location: [place];  Service: Endoscopy;;    Family Psychiatric History: Please see initial evaluation for full details. I have reviewed the history. No updates at this time.     Family History:  Family History  Problem Relation Age of Onset   Depression Mother    Hypertension Mother    Depression Father    Hypertension Father    Depression Sister    Breast cancer Sister 79   Hypertension Maternal Grandfather    Depression Maternal Grandmother    Hypertension Maternal Grandmother    Hypertension Paternal Grandfather    Hypertension Paternal Grandmother     Social History:  Social History   Socioeconomic History   Marital status: Married    Spouse name: Not on file   Number of children: Not on file   Years of education: Not on file   Highest education level: Not on file  Occupational History   Not on file  Tobacco Use   Smoking status: Never   Smokeless tobacco: Never  Vaping Use   Vaping Use: Never used  Substance and Sexual Activity   Alcohol use: Yes    Alcohol/week: 0.0 standard drinks    Comment: RARE   Drug use: No   Sexual activity: Not Currently  Other Topics Concern  Not on file  Social History Narrative   Not on file   Social Determinants of Health   Financial Resource Strain: Not on file  Food Insecurity: Not on file  Transportation Needs: Not on file  Physical Activity: Not on file  Stress: Not on file  Social Connections: Not on file    Allergies:  Allergies  Allergen Reactions   Losartan Palpitations    Could feel heartbeat (strongly) in neck   Penicillins Rash   Tape Rash    Skin irritation after back surgery white paper tape    Metabolic Disorder Labs: No results found for: HGBA1C, MPG No results found for: PROLACTIN Lab Results  Component Value Date   CHOL 232 (H) 01/07/2021   TRIG 53 01/07/2021   HDL 97 01/07/2021   CHOLHDL 2.4 01/07/2021   LDLCALC 126 (H) 01/07/2021   LDLCALC 111 (H)  01/06/2020   Lab Results  Component Value Date   TSH 1.360 01/07/2021   TSH 1.030 01/06/2020    Therapeutic Level Labs: No results found for: LITHIUM No results found for: VALPROATE No components found for:  CBMZ  Current Medications: Current Outpatient Medications  Medication Sig Dispense Refill   Ascorbic Acid (VITAMIN C) 1000 MG tablet Take 1,000 mg by mouth at bedtime.     cetirizine (ZYRTEC) 10 MG tablet Take 10 mg by mouth as needed.     Cholecalciferol (VITAMIN D-3) 125 MCG (5000 UT) TABS Take 5,000 Units by mouth at bedtime.     Cyanocobalamin (VITAMIN B-12) 2500 MCG SUBL Place 2,500 mcg under the tongue at bedtime.     desvenlafaxine (PRISTIQ) 100 MG 24 hr tablet Take 1 tablet (100 mg total) by mouth every morning. 30 tablet 2   ferrous sulfate 325 (65 FE) MG tablet Take 325 mg by mouth at bedtime.     fluticasone (FLONASE) 50 MCG/ACT nasal spray Place 2 sprays into both nostrils daily. (Patient taking differently: Place 2 sprays into both nostrils daily as needed (sinus issues/allergies.).) 16 g 6   furosemide (LASIX) 20 MG tablet TAKE 1 TABLET DAILY (Patient taking differently: Take 20 mg by mouth daily as needed (fluid retention.).) 90 tablet 3   ibuprofen (ADVIL) 800 MG tablet Take 1 tablet (800 mg total) by mouth every 8 (eight) hours as needed. 30 tablet 0   irbesartan (AVAPRO) 150 MG tablet Take 2 tablets (300 mg total) by mouth daily. 90 tablet 0   [START ON 12/05/2021] lamoTRIgine (LAMICTAL) 150 MG tablet Take 1 tablet (150 mg total) by mouth every morning. 90 tablet 0   levothyroxine (SYNTHROID) 75 MCG tablet Take 1 tablet (75 mcg total) by mouth daily before breakfast. 90 tablet 0   magnesium oxide (MAG-OX) 400 MG tablet Take 400 mg by mouth at bedtime.     propranolol (INDERAL) 20 MG tablet Take 1 tablet (20 mg total) by mouth daily as needed (anxiety). 90 tablet 0   QUEtiapine (SEROQUEL) 25 MG tablet Take 1 tablet (25 mg total) by mouth at bedtime. 30 tablet 0    rOPINIRole (REQUIP) 2 MG tablet TAKE 1 TABLET AT BEDTIME (REPLACES 0.25 MG) 90 tablet 0   tamsulosin (FLOMAX) 0.4 MG CAPS capsule Take 1 capsule (0.4 mg total) by mouth daily as needed. (Patient taking differently: Take 0.4 mg by mouth daily as needed (Kidney stones).) 30 capsule 2   topiramate (TOPAMAX) 100 MG tablet Take 2 tablets (200 mg total) by mouth at bedtime. 180 tablet 0   traMADol (ULTRAM) 50 MG  tablet Take 25-50 mg by mouth 2 (two) times daily as needed (back pain.).      [START ON 12/05/2021] traZODone (DESYREL) 100 MG tablet Take 1 tablet (100 mg total) by mouth at bedtime. 90 tablet 1   valACYclovir (VALTREX) 1000 MG tablet as needed.     No current facility-administered medications for this visit.     Musculoskeletal: Strength & Muscle Tone:  N/A Gait & Station:  N/A Patient leans: N/A  Psychiatric Specialty Exam: Review of Systems  There were no vitals taken for this visit.There is no height or weight on file to calculate BMI.  General Appearance: {Appearance:22683}  Eye Contact:  {BHH EYE CONTACT:22684}  Speech:  Clear and Coherent  Volume:  Normal  Mood:  {BHH MOOD:22306}  Affect:  {Affect (PAA):22687}  Thought Process:  Coherent  Orientation:  Full (Time, Place, and Person)  Thought Content: Logical   Suicidal Thoughts:  {ST/HT (PAA):22692}  Homicidal Thoughts:  {ST/HT (PAA):22692}  Memory:  Immediate;   Good  Judgement:  {Judgement (PAA):22694}  Insight:  {Insight (PAA):22695}  Psychomotor Activity:  Normal  Concentration:  Concentration: Good and Attention Span: Good  Recall:  Good  Fund of Knowledge: Good  Language: Good  Akathisia:  No  Handed:  Right  AIMS (if indicated): not done  Assets:  Communication Skills Desire for Improvement  ADL's:  Intact  Cognition: WNL  Sleep:  {BHH GOOD/FAIR/POOR:22877}   Screenings: GAD-7    Flowsheet Row Video Visit from 07/09/2021 in [facility] Office Visit from 01/07/2021 in [facility]  Office Visit from 04/29/2020 in [facility] Office Visit from 03/24/2020 in [facility] Office Visit from 01/06/2020 in [facility]  Total GAD-7 Score 0 3 0 0 15      PHQ2-9    Flowsheet Row Video Visit from 07/09/2021 in [facility] Video Visit from 06/24/2021 in [place] Office Visit from 01/07/2021 in [facility] Video Visit from 01/06/2021 in [facility] Office Visit from 04/29/2020 in [facility]  PHQ-2 Total Score 0 2 2 2  0  PHQ-9 Total Score 0 7 5 7  0      Flowsheet Row Video Visit from 06/24/2021 in [place] Video Visit from 05/24/2021 in [place] Video Visit from 01/06/2021 in [place] No Risk No Risk No Risk        Assessment and Plan:  Toni Green is a 62 y.o. year old female with a history of depression, binge eating disorder, hypertension, hypothyroidism, s/p roux-en-y gastric bypass in 2008, who presents for follow up appointment for below.     1. MDD (major depressive disorder), recurrent episode, moderate (HCC) There has been significant worsening in depressive symptoms and anxiety in the setting of recent complication from surgery.  Other psychosocial stressors includes loss of her sister in November, her mother with medical issues, and conflict with her son.  We continue current medication regimen given it is difficult to discern whether her current mood symptoms are more situational or any worsening in her underlying condition.  Will continue Pristiq to target depression.  Will continue BuSpar for anxiety.  Will continue quetiapine adjunctive treatment for depression.  We will plan to use this only for short-term given her history of binge eating disorder as described below. will continue lamotrigine for mood dysregulation at this time; noted that  she does not have  any episode consistent with bipolar disorder.  Will consider tapering it off in the future when her mood improves.  Will continue propranolol as needed at this time; will consider discontinuation of this medication in the near future to avoid polypharmacy. Although she will greatly benefit from supportive therapy/CBT, she would like to hold this at this time.    2. Insomnia, unspecified type Worsening due to her recent physical condition of colostomy.  Will continue current dose of trazodone as needed for insomnia.    # alcohol use She denies any alcohol use/craving since surgery.  Will discontinue naltrexone at this time and continue motivational interview.    3. Binge eating disorder Unchanged.  She has been on topiramate for binge eating.  Will continue current dose at this time to target this.    Plan Continue Pristiq 100 mg daily -[facility] Continue Buspar 7.5 mg twice a day Continue quetiapine 25 mg at night Continue propranolol 20 mg daily prn for anxiety Continue  lamotrigine 150 mg daily Continue topiramate 200 mg daily Discontinue naltrexone Continue trazodone 100 mg at night as needed for sleep Next appointment- 2.21 at 2 PM for 30 mins, video - on tramadol   The patient demonstrates the following risk factors for suicide: Chronic risk factors for suicide include: psychiatric disorder of depression . Acute risk factors for suicide include:  her family suffering from medical illness . Protective factors for this patient include: positive social support, coping skills, and hope for the future. Considering these factors, the overall suicide risk at this point appears to be low. Patient is appropriate for outpatient follow up.   Collaboration of Care: Collaboration of Care: {BH OP Collaboration of Care:21014065}  Patient/Guardian was advised Release of Information must be obtained prior to any record release in order to collaborate their care with an outside  provider. Patient/Guardian was advised if they have not already done so to contact the registration department to sign all necessary forms in order for [place] to release information regarding their care.   Consent: Patient/Guardian gives verbal consent for treatment and assignment of benefits for services provided during this visit. Patient/Guardian expressed understanding and agreed to proceed.    Norman Clay, MD 12/03/2021, 12:00 PM

## 2021-12-06 ENCOUNTER — Other Ambulatory Visit: Payer: Self-pay

## 2021-12-06 ENCOUNTER — Telehealth: Payer: Self-pay

## 2021-12-06 DIAGNOSIS — I10 Essential (primary) hypertension: Secondary | ICD-10-CM

## 2021-12-06 MED ORDER — IRBESARTAN 150 MG PO TABS: 300.0000 mg | ORAL_TABLET | Freq: Every day | ORAL | 0 refills | Status: DC

## 2021-12-06 NOTE — Telephone Encounter (Signed)
Will plan to order at her visit tomorrow- she should have enough meds.

## 2021-12-06 NOTE — Telephone Encounter (Signed)
pt need the rx for the pristiq as name brand.

## 2021-12-07 ENCOUNTER — Telehealth: Payer: Self-pay | Admitting: Psychiatry

## 2021-12-07 ENCOUNTER — Telehealth: Payer: 59 | Admitting: Psychiatry

## 2021-12-07 ENCOUNTER — Other Ambulatory Visit: Payer: Self-pay | Admitting: Internal Medicine

## 2021-12-07 ENCOUNTER — Other Ambulatory Visit: Payer: Self-pay

## 2021-12-07 ENCOUNTER — Other Ambulatory Visit: Payer: Self-pay | Admitting: Psychiatry

## 2021-12-07 DIAGNOSIS — E033 Postinfectious hypothyroidism: Secondary | ICD-10-CM

## 2021-12-07 DIAGNOSIS — G2581 Restless legs syndrome: Secondary | ICD-10-CM

## 2021-12-07 DIAGNOSIS — F5081 Binge eating disorder: Secondary | ICD-10-CM

## 2021-12-07 NOTE — Telephone Encounter (Signed)
Requested Prescriptions  Pending Prescriptions Disp Refills   levothyroxine (SYNTHROID) 75 MCG tablet [Pharmacy Med Name: Levothyroxine Sodium 75 MCG Oral Tablet] 90 tablet 3    Sig: TAKE 1 TABLET BY MOUTH DAILY  BEFORE BREAKFAST     Endocrinology:  Hypothyroid Agents Passed - 12/07/2021 12:43 PM      Passed - TSH in normal range and within 360 days    TSH  Date Value Ref Range Status  01/07/2021 1.360 0.450 - 4.500 uIU/mL Final         Passed - Valid encounter within last 12 months    Recent Outpatient Visits          5 months ago COVID-19 virus infection   [facility] Glean Hess, MD   11 months ago Annual physical exam   [facility] Glean Hess, MD   1 year ago Essential (primary) hypertension   [facility] Glean Hess, MD   1 year ago Parainfluenza virus pneumonia   [facility] Glean Hess, MD   1 year ago Acute non-recurrent maxillary sinusitis   [facility] Glean Hess, MD      Future Appointments            In 1 month [facility] Melia Jesse Sans, MD [facility]            rOPINIRole (REQUIP) 2 MG tablet [Pharmacy Med Name: rOPINIRole HCl 2 MG Oral Tablet] 90 tablet 3    Sig: TAKE 1 TABLET BY MOUTH AT  BEDTIME     Neurology:  Parkinsonian Agents Passed - 12/07/2021 12:43 PM      Passed - Last BP in normal range    BP Readings from Last 1 Encounters:  01/07/21 118/88         Passed - Last Heart Rate in normal range    Pulse Readings from Last 1 Encounters:  01/07/21 64         Passed - Valid encounter within last 12 months    Recent Outpatient Visits          5 months ago COVID-19 virus infection   [facility] Glean Hess, MD   11 months ago Annual physical exam   [facility] Glean Hess, MD   1 year ago Essential (primary) hypertension   [facility] Glean Hess, MD   1 year ago Parainfluenza virus pneumonia   [facility] Glean Hess, MD   1 year ago Acute non-recurrent maxillary sinusitis   [facility] Glean Hess, MD      Future Appointments            In 1 month Army Melia, Jesse Sans, MD [facility], [facility]

## 2021-12-07 NOTE — Progress Notes (Signed)
Virtual Visit via Video Note  I connected with Toni Green on 12/10/21 at  8:30 AM EST by a video enabled telemedicine application and verified that I am speaking with the correct person using two identifiers.  Location: Patient: hospital Provider: office Persons participated in the visit- patient, provider    I discussed the limitations of evaluation and management by telemedicine and the availability of in person appointments. The patient expressed understanding and agreed to proceed.   I discussed the assessment and treatment plan with the patient. The patient was provided an opportunity to ask questions and all were answered. The patient agreed with the plan and demonstrated an understanding of the instructions.   The patient was advised to call back or seek an in-person evaluation if the symptoms worsen or if the condition fails to improve as anticipated.  I provided 22 minutes of non-face-to-face time during this encounter.   Norman Clay, MD    [facility] MD/PA/NP OP Progress Note  12/10/2021 9:57 AM Toni Green  MRN:  [id number]  Chief Complaint:  Chief Complaint  Patient presents with   Follow-up   Depression   HPI:  This is a follow-up appointment for depression.  She states that she is currently at the hospital as her mother was admitted.  She will be discharged today to home.  She is concerned due to her medical condition.  Although Aleysha asked her mother to come to her place, her mother declined it.  Although her mother will have a home health a few times per week, the resources in the county is very limited.  She is also concerned as she also has appointment for herself.  She has been feeling agitated and anxious.  There were 2 days she took metoprolol up to 4 times a day.  She verbalized understanding about its potential risk of dizziness/hypotension.  She feels tired due to her situation.  Although she has significant anhedonia, she agrees to try things routinely such  as taking care of her house plants.  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  She denies change in appetite; she tends to eat only at night.  She denies SI.  She denies alcohol use or drug use.  She has been taking BuSpar once a day only; she agrees to try taking it twice a day at this time.    150 lbs Wt Readings from Last 3 Encounters:  01/07/21 169 lb (76.7 kg)  12/10/20 173 lb 11.2 oz (78.8 kg)  07/30/20 176 lb 9.6 oz (80.1 kg)     Visit Diagnosis:    ICD-10-CM   1. MDD (major depressive disorder), recurrent episode, moderate (HCC)  F33.1     2. Anxiety state  F41.1     3. Insomnia, unspecified type  G47.00       Past Psychiatric History: Please see initial evaluation for full details. I have reviewed the history. No updates at this time.     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   Claustrophobia    Complication of surgery 04/18/4192   Overview:  Dilated pouch found on EGD done 12/2014    Depression  History of kidney stones    Hypertension    Hypoglycemia    Hypothyroidism    Inflammation of sacroiliac joint ([place]) 08/22/2014   Pneumonia 03/2020   PONV (postoperative nausea and vomiting)    NASUEATED/BAD HEADACHES   Seizures ([place]) 1985   x1, after labor/childbirth   Thyroid disease    Wears glasses     Past Surgical History:  Procedure Laterality Date   ABDOMINAL HYSTERECTOMY  yrs ago   partial   APPENDECTOMY  2010   BACK SURGERY  10/2005   lumbar   COLONOSCOPY WITH PROPOFOL N/A 09/23/2016   Procedure: COLONOSCOPY WITH PROPOFOL;  Surgeon: Lucilla Lame, MD;  Location: [place];  Service: Endoscopy;  Laterality: N/A;   CYSTOSCOPY     x 1   EVALUATION UNDER ANESTHESIA WITH  HEMORRHOIDECTOMY N/A 07/06/2020   Procedure: EXAM UNDER ANESTHESIA WITH HEMORRHOIDECTOMY;  Surgeon: Fredirick Maudlin, MD;  Location: [facility];  Service: General;  Laterality: N/A;   GALLBLADDER SURGERY  2009   lap   GASTRIC BYPASS  2008   roux en y   MUCOSAL ADVANCEMENT FLAP N/A 12/10/2020   Procedure: [place];  Surgeon: Leighton Ruff, MD;  [facility];  Service: General;  Laterality: N/A;  60 MIN   POLYPECTOMY  09/23/2016   Procedure: POLYPECTOMY;  Surgeon: Lucilla Lame, MD;  Location: [place];  Service: Endoscopy;;    Family Psychiatric History: Please see initial evaluation for full details. I have reviewed the history. No updates at this time.     Family History:  Family History  Problem Relation Age of Onset   Depression Mother    Hypertension Mother    Depression Father    Hypertension Father    Depression Sister    Breast cancer Sister 47   Hypertension Maternal Grandfather    Depression Maternal Grandmother    Hypertension Maternal Grandmother    Hypertension Paternal Grandfather    Hypertension Paternal Grandmother     Social History:  Social History   Socioeconomic History   Marital status: Married    Spouse name: Not on file   Number of children: Not on file   Years of education: Not on file   Highest education level: Not on file  Occupational History   Not on file  Tobacco Use   Smoking status: Never   Smokeless tobacco: Never  Vaping Use   Vaping Use: Never used  Substance and Sexual Activity   Alcohol use: Yes    Alcohol/week: 0.0 standard drinks    Comment: RARE   Drug use: No   Sexual activity: Not Currently  Other Topics Concern   Not on file  Social History Narrative   Not on file   Social Determinants of Health   Financial Resource Strain: Not on file  Food Insecurity: Not on file  Transportation Needs: Not on file  Physical Activity: Not on file  Stress: Not on file  Social  Connections: Not on file    Allergies:  Allergies  Allergen Reactions   Losartan Palpitations    Could feel heartbeat (strongly) in neck   Penicillins Rash   Tape Rash    Skin irritation after back surgery white paper tape    Metabolic Disorder Labs: No results found for: HGBA1C, MPG No results found for: PROLACTIN Lab Results  Component Value Date   CHOL 232 (H) 01/07/2021   TRIG 53 01/07/2021   HDL 97 01/07/2021   CHOLHDL 2.4 01/07/2021  [place] 126 (H) 01/07/2021   LDLCALC 111 (H) 01/06/2020   Lab Results  Component Value Date   TSH 1.360 01/07/2021   TSH 1.030 01/06/2020    Therapeutic Level Labs: No results found for: LITHIUM No results found for: VALPROATE No components found for:  CBMZ  Current Medications: Current Outpatient Medications  Medication Sig Dispense Refill   Ascorbic Acid (VITAMIN C) 1000 MG tablet Take 1,000 mg by mouth at bedtime.     cetirizine (ZYRTEC) 10 MG tablet Take 10 mg by mouth as needed.     Cholecalciferol (VITAMIN D-3) 125 MCG (5000 UT) TABS Take 5,000 Units by mouth at bedtime.     Cyanocobalamin (VITAMIN B-12) 2500 MCG SUBL Place 2,500 mcg under the tongue at bedtime.     desvenlafaxine (PRISTIQ) 100 MG 24 hr tablet Take 1 tablet (100 mg total) by mouth every morning. 30 tablet 2   ferrous sulfate 325 (65 FE) MG tablet Take 325 mg by mouth at bedtime.     fluticasone (FLONASE) 50 MCG/ACT nasal spray Place 2 sprays into both nostrils daily. (Patient taking differently: Place 2 sprays into both nostrils daily as needed (sinus issues/allergies.).) 16 g 6   furosemide (LASIX) 20 MG tablet TAKE 1 TABLET DAILY (Patient taking differently: Take 20 mg by mouth daily as needed (fluid retention.).) 90 tablet 3   ibuprofen (ADVIL) 800 MG tablet Take 1 tablet (800 mg total) by mouth every 8 (eight) hours as needed. 30 tablet 0   irbesartan (AVAPRO) 150 MG tablet Take 2 tablets (300 mg total) by mouth daily. 90 tablet 0   lamoTRIgine (LAMICTAL)  150 MG tablet Take 1 tablet (150 mg total) by mouth every morning. 90 tablet 0   levothyroxine (SYNTHROID) 75 MCG tablet TAKE 1 TABLET BY MOUTH DAILY  BEFORE BREAKFAST 90 tablet 3   magnesium oxide (MAG-OX) 400 MG tablet Take 400 mg by mouth at bedtime.     propranolol (INDERAL) 20 MG tablet Take 1 tablet (20 mg total) by mouth daily as needed (anxiety). 90 tablet 0   QUEtiapine (SEROQUEL) 25 MG tablet Take 1 tablet (25 mg total) by mouth at bedtime. 30 tablet 0   rOPINIRole (REQUIP) 2 MG tablet TAKE 1 TABLET BY MOUTH AT  BEDTIME 90 tablet 3   tamsulosin (FLOMAX) 0.4 MG CAPS capsule Take 1 capsule (0.4 mg total) by mouth daily as needed. (Patient taking differently: Take 0.4 mg by mouth daily as needed (Kidney stones).) 30 capsule 2   topiramate (TOPAMAX) 100 MG tablet Take 2 tablets (200 mg total) by mouth at bedtime. 180 tablet 0   traMADol (ULTRAM) 50 MG tablet Take 25-50 mg by mouth 2 (two) times daily as needed (back pain.).      traZODone (DESYREL) 100 MG tablet Take 1 tablet (100 mg total) by mouth at bedtime. 90 tablet 1   valACYclovir (VALTREX) 1000 MG tablet as needed.     No current facility-administered medications for this visit.     Musculoskeletal: Strength & Muscle Tone:  N/A Gait & Station:  N/A Patient leans: N/A  Psychiatric Specialty Exam: Review of Systems  Psychiatric/Behavioral:  Positive for decreased concentration, dysphoric mood and sleep disturbance. Negative for agitation, behavioral problems, confusion, hallucinations, self-injury and suicidal ideas. The patient is nervous/anxious. The patient is not hyperactive.   All other systems reviewed and are negative.  There were no vitals taken for this visit.There is no height or weight on file to calculate BMI.  General Appearance: Fairly Groomed  Eye Contact:  Good  Speech:  Clear and Coherent  Volume:  Normal  Mood:  Anxious  Affect:  Appropriate, Congruent, and Tearful  Thought Process:  Coherent   Orientation:  Full (Time, Place, and Person)  Thought Content: Logical   Suicidal Thoughts:  No  Homicidal Thoughts:  No  Memory:  Immediate;   Good  Judgement:  Good  Insight:  Good  Psychomotor Activity:  Normal  Concentration:  Concentration: Good and Attention Span: Good  Recall:  Good  Fund of Knowledge: Good  Language: Good  Akathisia:  No  Handed:  Right  AIMS (if indicated): not done  Assets:  Communication Skills Desire for Improvement  ADL's:  Intact  Cognition: WNL  Sleep:  Poor   Screenings: GAD-7    Flowsheet Row Video Visit from 07/09/2021 in [facility] Office Visit from 01/07/2021 in [facility] Office Visit from 04/29/2020 in [facility] Office Visit from 03/24/2020 in [facility] Office Visit from 01/06/2020 in [facility]  Total GAD-7 Score 0 3 0 0 15      PHQ2-9    Flowsheet Row Video Visit from 07/09/2021 in [facility] Video Visit from 06/24/2021 in [place] Office Visit from 01/07/2021 in [facility] Video Visit from 01/06/2021 in [facility] Office Visit from 04/29/2020 in [facility]  PHQ-2 Total Score 0 2 2 2  0  PHQ-9 Total Score 0 7 5 7  0      Flowsheet Row Video Visit from 06/24/2021 in [place] Video Visit from 05/24/2021 in [place] Video Visit from 01/06/2021 in [place] No Risk No Risk No Risk        Assessment and Plan:  Toni Green is a 61 y.o. year old female with a history of depression, binge eating disorder, hypertension, hypothyroidism, s/p roux-en-y gastric bypass in 2008, who presents for follow up appointment for below.   1. MDD (major depressive disorder), recurrent episode, moderate ([place]) 2. Anxiety state She continues to report depressive symptoms and an anxiety in the setting of  her mother being admitted.  Other psychosocial stressors includes recent complication from surgery/having ostomy bag, loss of her sister in November, and conflict with her son.  She does not want to adjust medication which could potentially cause weight gain at this time.  Will try higher dose of BuSpar to target anxiety.  Will continue Pristiq to target depression.  Will continue quetiapine adjunctive treatment for depression.  The hope is to use this medication only for short-term to avoid its metabolic side effect.  Will continue lamotrigine for mood dysregulation at this time; noted that she does not have any episode consistent with bipolar disorder.  Will consider tapering it off in the future when her mood improves.  Will continue propranolol as needed for anxiety.  Will plan to discontinue this medication in the future to avoid polypharmacy.  Although she will greatly benefit from CBT/supportive therapy; she declined this option.   3. Insomnia, unspecified type \\Worsening  since having colostomy.  Will continue current dose of trazodone as needed for insomnia.    # alcohol use Unchanged. She denies any alcohol use/craving since surgery.  Will continue motivational interview.    3. Binge eating disorder Unchanged.  She has been on topiramate for binge eating.  Will continue current dose at this time to target this.  This clinician has discussed the side effect associated with medication prescribed during this encounter. Please refer to notes in the previous encounters for more details.     Plan (she will contact the office if she needs any refill) Continue Pristiq 100 mg daily -[facility] Increase Buspar 7.5 mg twice a day (was taking 7.5 mg daily) Continue quetiapine 25 mg at night Continue propranolol 20 mg daily prn for anxiety Continue  lamotrigine 150 mg daily Continue topiramate 200 mg daily Continue trazodone 100 mg at night as needed for sleep Next appointment- 3/27 at 2 PM for 30  mins, in person - on tramadol   The patient demonstrates the following risk factors for suicide: Chronic risk factors for suicide include: psychiatric disorder of depression . Acute risk factors for suicide include:  her family suffering from medical illness . Protective factors for this patient include: positive social support, coping skills, and hope for the future. Considering these factors, the overall suicide risk at this point appears to be low. Patient is appropriate for outpatient follow up.     Collaboration of Care: Collaboration of Care: Other N/A   Consent: Patient/Guardian gives verbal consent for treatment and assignment of benefits for services provided during this visit. Patient/Guardian expressed understanding and agreed to proceed.    Norman Clay, MD 12/10/2021, 9:57 AM

## 2021-12-07 NOTE — Telephone Encounter (Signed)
Contacted the patient for scheduled appointment.  She states that she is on the way to the hospital as her mother was admitted today.  She verbalized understanding that the visit will not be done while she is driving.  She is willing to reschedule the appointment for virtual visit on 2/24 at 8:30, video

## 2021-12-10 ENCOUNTER — Telehealth (INDEPENDENT_AMBULATORY_CARE_PROVIDER_SITE_OTHER): Payer: 59 | Admitting: Psychiatry

## 2021-12-10 ENCOUNTER — Encounter: Payer: Self-pay | Admitting: Psychiatry

## 2021-12-10 ENCOUNTER — Other Ambulatory Visit: Payer: Self-pay

## 2021-12-10 DIAGNOSIS — G4700 Insomnia, unspecified: Secondary | ICD-10-CM | POA: Diagnosis not present

## 2021-12-10 DIAGNOSIS — F331 Major depressive disorder, recurrent, moderate: Secondary | ICD-10-CM

## 2021-12-10 DIAGNOSIS — F411 Generalized anxiety disorder: Secondary | ICD-10-CM | POA: Diagnosis not present

## 2021-12-10 NOTE — Patient Instructions (Signed)
Continue Pristiq 100 mg daily  Increase Buspar 7.5 mg twice a day  Continue quetiapine 25 mg at night Continue propranolol 20 mg daily prn for anxiety Continue  lamotrigine 150 mg daily Continue topiramate 200 mg daily Discontinue naltrexone Continue trazodone 100 mg at night as needed for sleep Next appointment- 3/27 at 2 PM, in person  The next visit will be in person visit. Please arrive 15 mins before the scheduled time.   [facility]  Address: [place], [place] [address]

## 2021-12-20 ENCOUNTER — Telehealth: Payer: Self-pay

## 2021-12-20 ENCOUNTER — Other Ambulatory Visit: Payer: Self-pay | Admitting: Psychiatry

## 2021-12-20 NOTE — Telephone Encounter (Signed)
Decline. The patient specifically requested the medication to be sent to [facility], fyi.

## 2021-12-20 NOTE — Telephone Encounter (Signed)
pt needs refill on the name brand pristiq  ?

## 2021-12-21 ENCOUNTER — Other Ambulatory Visit: Payer: Self-pay | Admitting: Internal Medicine

## 2021-12-21 ENCOUNTER — Encounter: Payer: Self-pay | Admitting: Internal Medicine

## 2021-12-21 DIAGNOSIS — E161 Other hypoglycemia: Secondary | ICD-10-CM | POA: Insufficient documentation

## 2021-12-21 MED ORDER — GLUCOSE BLOOD VI STRP: ORAL_STRIP | 12 refills | Status: AC

## 2021-12-21 MED ORDER — ONETOUCH VERIO REFLECT W/DEVICE KIT: 1.0000 | PACK | Freq: Every day | 0 refills | Status: AC

## 2021-12-21 MED ORDER — ONETOUCH DELICA LANCETS 30G MISC: 1.0000 | Freq: Four times a day (QID) | 12 refills | Status: AC | PRN

## 2021-12-28 LAB — BASIC METABOLIC PANEL
BUN: 14 (ref 4–21)
CO2: 26 — AB (ref 13–22)
Chloride: 107 (ref 99–108)
Creatinine: 0.8 (ref 0.5–1.1)
Glucose: 88
Potassium: 4.1 mEq/L (ref 3.5–5.1)
Sodium: 139 (ref 137–147)

## 2021-12-28 LAB — CBC AND DIFFERENTIAL
HCT: 31 — AB (ref 36–46)
Hemoglobin: 9.9 — AB (ref 12.0–16.0)
Platelets: 157 10*3/uL (ref 150–400)
WBC: 3.4

## 2021-12-28 LAB — HEPATIC FUNCTION PANEL
ALT: 15 U/L (ref 7–35)
AST: 17 (ref 13–35)
Alkaline Phosphatase: 119 (ref 25–125)
Bilirubin, Total: 0.5

## 2021-12-28 LAB — COMPREHENSIVE METABOLIC PANEL: eGFR: 90

## 2021-12-29 ENCOUNTER — Encounter: Payer: Self-pay | Admitting: Internal Medicine

## 2022-01-05 NOTE — Progress Notes (Signed)
BH MD/PA/NP OP Progress Note ? ?01/10/2022 5:13 PM ?Earley Favor  ?MRN:  [id number] ? ?Chief Complaint:  ?Chief Complaint  ?Patient presents with  ? Follow-up  ? Depression  ? Other  ? Stress  ? ?HPI:  ?This is a follow-up appointment for depression and anxiety.  ?She states that she needs help.  Her mother is not doing well, although she is now living by herself, being discharged from the hospital.  She feels very worried about her work.  She does not think she can work due to her mood.  She cannot even read.  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  She feels frustrated about this, stating that she has worked since very young.  She talks about stress of her mother, her medical condition with upcoming appointment with surgery, and loss of her sister.  She feels overwhelmed and lack of control.  She has not been able to go to church due to her mood.  She also had significant anxiety when she was around with people, although she used to be very social.  She has insomnia.  She has significant difficulty in concentration.  She has poor appetite; she has not done binge eating for the past few years.  She denies SI.  She drinks beer once a week or less.  She used CBD gummy a few weeks ago.  ? ?Wt Readings from Last 3 Encounters:  ?01/10/22 157 lb 3.2 oz (71.3 kg)  ?01/07/21 169 lb (76.7 kg)  ?12/10/20 173 lb 11.2 oz (78.8 kg)  ?  ? ?Visit Diagnosis:  ?  ICD-10-CM   ?1. Insomnia, unspecified type  G47.00   ?  ?2. MDD (major depressive disorder), recurrent episode, moderate (HCC)  F33.1 desvenlafaxine (PRISTIQ) 100 MG 24 hr tablet  ?  ? ? ?Past Psychiatric History: Please see initial evaluation for full details. I have reviewed the history. No updates at this time.  ?  ? ?Past Medical History:  ?Past Medical History:  ?Diagnosis Date  ? Anemia   ? Anxiety   ? Arthritis   ? fingers  ? Arthropathy of cervical facet joint 03/17/2014  ? Bursitis   ? hips  ? Bursitis, trochanteric  10/01/2014  ? Cancer ([place]) 2017  ? 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 Claustrophobia   ? Complication of surgery 02/17/2705  ? Overview:  Dilated pouch found on EGD done 12/2014   ? Depression   ? History of kidney stones   ? Hypertension   ? Hypoglycemia   ? Hypothyroidism   ? Inflammation of sacroiliac joint ([place]) 08/22/2014  ? Pneumonia 03/2020  ? PONV (postoperative nausea and vomiting)   ? NASUEATED/BAD HEADACHES  ? Seizures ([place]) 1985  ? x1, after labor/childbirth  ? Thyroid disease   ? Wears glasses   ?  ?Past Surgical History:  ?Procedure Laterality Date  ? ABDOMINAL HYSTERECTOMY  yrs ago  ? partial  ? APPENDECTOMY  2010  ? BACK SURGERY  10/2005  ? lumbar  ? COLONOSCOPY WITH PROPOFOL N/A 09/23/2016  ? Procedure: COLONOSCOPY WITH PROPOFOL;  Surgeon: Lucilla Lame, MD;  Location: [place];  Service: Endoscopy;  Laterality: N/A;  ? CYSTOSCOPY    ? x 1  ? EVALUATION UNDER ANESTHESIA WITH HEMORRHOIDECTOMY N/A 07/06/2020  ? Procedure: EXAM UNDER ANESTHESIA WITH HEMORRHOIDECTOMY;  Surgeon: Fredirick Maudlin, MD;  Location: [facility];  Service: General;  Laterality: N/A;  ? GALLBLADDER SURGERY  2009  ?  lap  ? GASTRIC BYPASS  2008  ? roux en y  ? MUCOSAL ADVANCEMENT FLAP N/A 12/10/2020  ? Procedure: MUCOSAL ADVANCEMENT FLAP;  Surgeon: Leighton Ruff, MD;  Location: [facility]  Service: General;  Laterality: N/A;  60 MIN  ? POLYPECTOMY  09/23/2016  ? Procedure: POLYPECTOMY;  Surgeon: Lucilla Lame, MD;  Location: [place];  Service: Endoscopy;;  ? ? ?Family Psychiatric History: Please see initial evaluation for full details. I have reviewed the history. No updates at this time.  ?  ? ?Family History:  ?Family History  ?Problem Relation Age of Onset  ? Depression Mother   ? Hypertension Mother   ? Depression Father   ? Hypertension Father   ? Depression Sister   ? Breast cancer Sister 86  ? Hypertension Maternal Grandfather   ? Depression Maternal Grandmother   ? Hypertension Maternal Grandmother    ? Hypertension Paternal Grandfather   ? Hypertension Paternal Grandmother   ? ? ?Social History:  ?Social History  ? ?Socioeconomic History  ? Marital status: Married  ?  Spouse name: Not on file  ? Number of children: Not on file  ? Years of education: Not on file  ? Highest education level: Not on file  ?Occupational History  ? Not on file  ?Tobacco Use  ? Smoking status: Never  ? Smokeless tobacco: Never  ?Vaping Use  ? Vaping Use: Never used  ?Substance and Sexual Activity  ? Alcohol use: Yes  ?  Alcohol/week: 0.0 standard drinks  ?  Comment: RARE  ? Drug use: No  ? Sexual activity: Not Currently  ?Other Topics Concern  ? Not on file  ?Social History Narrative  ? Not on file  ? ?Social Determinants of Health  ? ?Financial Resource Strain: Not on file  ?Food Insecurity: Not on file  ?Transportation Needs: Not on file  ?Physical Activity: Not on file  ?Stress: Not on file  ?Social Connections: Not on file  ? ? ?Allergies:  ?Allergies  ?Allergen Reactions  ? Losartan Palpitations  ?  Could feel heartbeat (strongly) in neck  ? Penicillins Rash  ? Tape Rash  ?  Skin irritation after back surgery white paper tape  ? ? ?Metabolic Disorder Labs: ?No results found for: HGBA1C, MPG ?No results found for: PROLACTIN ?Lab Results  ?Component Value Date  ? CHOL 232 (H) 01/07/2021  ? TRIG 53 01/07/2021  ? HDL 97 01/07/2021  ? CHOLHDL 2.4 01/07/2021  ? LDLCALC 126 (H) 01/07/2021  ? LDLCALC 111 (H) 01/06/2020  ? ?Lab Results  ?Component Value Date  ? TSH 1.360 01/07/2021  ? TSH 1.030 01/06/2020  ? ? ?Therapeutic Level Labs: ?No results found for: LITHIUM ?No results found for: VALPROATE ?No components found for:  CBMZ ? ?Current Medications: ?Current Outpatient Medications  ?Medication Sig Dispense Refill  ? Ascorbic Acid (VITAMIN C) 1000 MG tablet Take 1,000 mg by mouth at bedtime.    ? azithromycin (ZITHROMAX Z-PAK) 250 MG tablet UAD 6 each 0  ? Blood Glucose Monitoring Suppl (ONETOUCH VERIO REFLECT) w/Device KIT 1 each by  Does not apply route daily at 6 (six) AM. 1 kit 0  ? cetirizine (ZYRTEC) 10 MG tablet Take 10 mg by mouth as needed.    ? Cholecalciferol (VITAMIN D-3) 125 MCG (5000 UT) TABS Take 5,000 Units by mouth at bedtime.    ? Cyanocobalamin (VITAMIN B-12) 2500 MCG SUBL Place 2,500 mcg under the tongue at bedtime.    ?  ferrous sulfate 325 (65 FE) MG tablet Take 325 mg by mouth at bedtime.    ? fluticasone (FLONASE) 50 MCG/ACT nasal spray Place 2 sprays into both nostrils daily. (Patient taking differently: Place 2 sprays into both nostrils daily as needed (sinus issues/allergies.).) 16 g 6  ? furosemide (LASIX) 20 MG tablet TAKE 1 TABLET DAILY (Patient taking differently: Take 20 mg by mouth daily as needed (fluid retention.).) 90 tablet 3  ? glucose blood test strip Use as instructed qid 100 each 12  ? ibuprofen (ADVIL) 800 MG tablet Take 1 tablet (800 mg total) by mouth every 8 (eight) hours as needed. 30 tablet 0  ? irbesartan (AVAPRO) 150 MG tablet Take 2 tablets (300 mg total) by mouth daily. (Patient taking differently: Take 300 mg by mouth daily. Once daily) 90 tablet 0  ? lamoTRIgine (LAMICTAL) 150 MG tablet Take 1 tablet (150 mg total) by mouth every morning. 90 tablet 0  ? levothyroxine (SYNTHROID) 75 MCG tablet TAKE 1 TABLET BY MOUTH DAILY  BEFORE BREAKFAST 90 tablet 3  ? magnesium oxide (MAG-OX) 400 MG tablet Take 400 mg by mouth at bedtime.    ? methocarbamol (ROBAXIN) 500 MG tablet Take 500 mg by mouth 3 (three) times daily.    ? OneTouch Delica Lancets 59M MISC 1 each by Does not apply route 4 (four) times daily as needed. 100 each 12  ? PREMARIN vaginal cream Place vaginally 2 (two) times a week.    ? promethazine-dextromethorphan (PROMETHAZINE-DM) 6.25-15 MG/5ML syrup Take 5 mLs by mouth 4 (four) times daily as needed for cough. 118 mL 0  ? propranolol (INDERAL) 20 MG tablet Take 1 tablet (20 mg total) by mouth daily as needed (anxiety). 90 tablet 0  ? rOPINIRole (REQUIP) 2 MG tablet TAKE 1 TABLET BY MOUTH  AT  BEDTIME 90 tablet 3  ? tamsulosin (FLOMAX) 0.4 MG CAPS capsule Take 1 capsule (0.4 mg total) by mouth daily as needed. (Patient taking differently: Take 0.4 mg by mouth daily as needed (Kidney stones).) 30

## 2022-01-06 ENCOUNTER — Encounter: Payer: Self-pay | Admitting: Internal Medicine

## 2022-01-06 ENCOUNTER — Other Ambulatory Visit: Payer: Self-pay | Admitting: Psychiatry

## 2022-01-06 ENCOUNTER — Telehealth (INDEPENDENT_AMBULATORY_CARE_PROVIDER_SITE_OTHER): Payer: 59 | Admitting: Internal Medicine

## 2022-01-06 VITALS — BP 132/96 | HR 71 | Temp 98.0°F | Ht 70.0 in

## 2022-01-06 DIAGNOSIS — J0100 Acute maxillary sinusitis, unspecified: Secondary | ICD-10-CM | POA: Diagnosis not present

## 2022-01-06 DIAGNOSIS — F5081 Binge eating disorder: Secondary | ICD-10-CM

## 2022-01-06 DIAGNOSIS — F3341 Major depressive disorder, recurrent, in partial remission: Secondary | ICD-10-CM

## 2022-01-06 MED ORDER — PROMETHAZINE-DM 6.25-15 MG/5ML PO SYRP: 5.0000 mL | ORAL_SOLUTION | Freq: Four times a day (QID) | ORAL | 0 refills | Status: DC | PRN

## 2022-01-06 MED ORDER — AZITHROMYCIN 250 MG PO TABS
ORAL_TABLET | ORAL | 0 refills | Status: AC
Start: 2022-01-06 — End: 2022-01-11

## 2022-01-06 NOTE — Progress Notes (Signed)
? ? ?Date:  01/06/2022  ? ?Name:  Toni Green   DOB:  May 30, 1961   MRN:  [id number] ? ?This encounter was conducted via video encounter due to the need for social distancing in light of the Covid-19 pandemic.  The patient was correctly identified.  I advised that I am conducting the visit from a secure room in my office at [facility].  The patient is located at home. The limitations of this form of encounter were discussed with the patient and he/she agreed to proceed.  Some vital signs will be absent. ? ?Chief Complaint: Sinus Problem and Sinusitis ? ?Sinus Problem ?This is a new problem. Episode onset: x 1.5 weeks. The problem has been gradually worsening since onset. There has been no fever. Associated symptoms include chills, congestion, coughing, headaches, sinus pressure and sneezing. Pertinent negatives include no shortness of breath. (Runny nose) Past treatments include oral decongestants. The treatment provided no relief.  ? ?Lab Results  ?Component Value Date  ? NA 141 01/07/2021  ? K 3.8 01/07/2021  ? CO2 21 01/07/2021  ? GLUCOSE 83 01/07/2021  ? BUN 8 01/07/2021  ? CREATININE 0.84 01/07/2021  ? CALCIUM 9.8 01/07/2021  ? EGFR 80 01/07/2021  ? GFRNONAA 77 01/06/2020  ? ?Lab Results  ?Component Value Date  ? CHOL 232 (H) 01/07/2021  ? HDL 97 01/07/2021  ? LDLCALC 126 (H) 01/07/2021  ? TRIG 53 01/07/2021  ? CHOLHDL 2.4 01/07/2021  ? ?Lab Results  ?Component Value Date  ? TSH 1.360 01/07/2021  ? ?No results found for: HGBA1C ?Lab Results  ?Component Value Date  ? WBC 10.4 01/07/2021  ? HGB 13.6 01/07/2021  ? HCT 41.1 01/07/2021  ? MCV 93 01/07/2021  ? PLT 332 01/07/2021  ? ?Lab Results  ?Component Value Date  ? ALT 43 (H) 01/07/2021  ? AST 30 01/07/2021  ? ALKPHOS 227 (H) 01/07/2021  ? BILITOT 0.2 01/07/2021  ? ?Lab Results  ?Component Value Date  ? VD25OH 39.3 01/07/2021  ?  ? ?Review of Systems  ?Constitutional:  Positive for chills and fatigue. Negative for fever.  ?HENT:  Positive for  congestion, sinus pressure, sneezing and voice change.   ?Respiratory:  Positive for cough. Negative for shortness of breath and wheezing.   ?Neurological:  Positive for headaches.  ?Psychiatric/Behavioral:  Positive for decreased concentration and dysphoric mood. The patient is nervous/anxious.   ? ?Patient Active Problem List  ? Diagnosis Date Noted  ? Reactive hypoglycemia 12/21/2021  ? 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 MDD (major depressive disorder), recurrent, in partial remission ([place]) 01/06/2021  ? Status post hysterectomy 11/25/2020  ? Hemorrhoids   ? Anxiety state 10/14/2019  ? Binge eating disorder 10/14/2019  ? Restless leg syndrome 11/30/2018  ? Bright red rectal bleeding 11/30/2018  ? S/P gastric bypass 08/07/2017  ? Special screening for malignant neoplasms, colon   ? Benign neoplasm of ascending colon   ? Polyp of sigmoid colon   ? Menopause syndrome 07/26/2016  ? 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 Essential (primary) hypertension 07/20/2015  ? 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 DDD (degenerative disc disease), cervical 03/17/2014  ? Calculus of kidney 02/13/2013  ? ? ?Allergies  ?Allergen Reactions  ? Losartan Palpitations  ?  Could feel heartbeat (strongly) in neck  ? Penicillins Rash  ? Tape Rash  ?  Skin irritation after back surgery white paper tape  ? ? ?Past Surgical  History:  ?Procedure Laterality Date  ? ABDOMINAL HYSTERECTOMY  yrs ago  ? partial  ? APPENDECTOMY  2010  ? BACK SURGERY  10/2005  ? lumbar  ? COLONOSCOPY WITH PROPOFOL N/A 09/23/2016  ? Procedure: COLONOSCOPY WITH PROPOFOL;  Surgeon: Lucilla Lame, MD;  Location: [facility];  Service: Endoscopy;  Laterality: N/A;  ? CYSTOSCOPY    ? x 1  ? EVALUATION UNDER ANESTHESIA WITH HEMORRHOIDECTOMY N/A 07/06/2020  ? Procedure: EXAM UNDER ANESTHESIA WITH HEMORRHOIDECTOMY;  Surgeon: Fredirick Maudlin, MD;  Location: [facility];  Service: General;   Laterality: N/A;  ? GALLBLADDER SURGERY  2009  ? lap  ? GASTRIC BYPASS  2008  ? roux en y  ? MUCOSAL ADVANCEMENT FLAP N/A 12/10/2020  ? Procedure: MUCOSAL ADVANCEMENT FLAP;  Surgeon: Leighton Ruff, MD;  Location: [facility];  Service: General;  Laterality: N/A;  60 MIN  ? POLYPECTOMY  09/23/2016  ? Procedure: POLYPECTOMY;  Surgeon: Lucilla Lame, MD;  Location: [facility];  Service: Endoscopy;;  ? ? ?Social History  ? ?Tobacco Use  ? Smoking status: Never  ? Smokeless tobacco: Never  ?Vaping Use  ? Vaping Use: Never used  ?Substance Use Topics  ? Alcohol use: Yes  ?  Alcohol/week: 0.0 standard drinks  ?  Comment: RARE  ? Drug use: No  ? ? ? ?Medication list has been reviewed and updated. ? ?Current Meds  ?Medication Sig  ? Ascorbic Acid (VITAMIN C) 1000 MG tablet Take 1,000 mg by mouth at bedtime.  ? Blood Glucose Monitoring Suppl (ONETOUCH VERIO REFLECT) w/Device KIT 1 each by Does not apply route daily at 6 (six) AM.  ? cetirizine (ZYRTEC) 10 MG tablet Take 10 mg by mouth as needed.  ? Cholecalciferol (VITAMIN D-3) 125 MCG (5000 UT) TABS Take 5,000 Units by mouth at bedtime.  ? Cyanocobalamin (VITAMIN B-12) 2500 MCG SUBL Place 2,500 mcg under the tongue at bedtime.  ? desvenlafaxine (PRISTIQ) 100 MG 24 hr tablet Take 1 tablet (100 mg total) by mouth every morning.  ? ferrous sulfate 325 (65 FE) MG tablet Take 325 mg by mouth at bedtime.  ? fluticasone (FLONASE) 50 MCG/ACT nasal spray Place 2 sprays into both nostrils daily. (Patient taking differently: Place 2 sprays into both nostrils daily as needed (sinus issues/allergies.).)  ? furosemide (LASIX) 20 MG tablet TAKE 1 TABLET DAILY (Patient taking differently: Take 20 mg by mouth daily as needed (fluid retention.).)  ? glucose blood test strip Use as instructed qid  ? ibuprofen (ADVIL) 800 MG tablet Take 1 tablet (800 mg total) by mouth every 8 (eight) hours as needed.  ? irbesartan (AVAPRO) 150 MG tablet Take 2 tablets (300 mg total) by  mouth daily. (Patient taking differently: Take 300 mg by mouth daily. Once daily)  ? lamoTRIgine (LAMICTAL) 150 MG tablet Take 1 tablet (150 mg total) by mouth every morning.  ? levothyroxine (SYNTHROID) 75 MCG tablet TAKE 1 TABLET BY MOUTH DAILY  BEFORE BREAKFAST  ? magnesium oxide (MAG-OX) 400 MG tablet Take 400 mg by mouth at bedtime.  ? OneTouch Delica Lancets 16X MISC 1 each by Does not apply route 4 (four) times daily as needed.  ? propranolol (INDERAL) 20 MG tablet Take 1 tablet (20 mg total) by mouth daily as needed (anxiety).  ? QUEtiapine (SEROQUEL) 25 MG tablet Take 1 tablet (25 mg total) by mouth at bedtime.  ? rOPINIRole (REQUIP) 2 MG tablet TAKE 1 TABLET BY MOUTH AT  BEDTIME  ?  tamsulosin (FLOMAX) 0.4 MG CAPS capsule Take 1 capsule (0.4 mg total) by mouth daily as needed. (Patient taking differently: Take 0.4 mg by mouth daily as needed (Kidney stones).)  ? topiramate (TOPAMAX) 100 MG tablet Take 2 tablets (200 mg total) by mouth at bedtime.  ? traMADol (ULTRAM) 50 MG tablet Take 25-50 mg by mouth 2 (two) times daily as needed (back pain.).   ? traZODone (DESYREL) 100 MG tablet Take 1 tablet (100 mg total) by mouth at bedtime.  ? valACYclovir (VALTREX) 1000 MG tablet as needed.  ? ? ? ?  01/06/2022  ? 10:27 AM 07/09/2021  ?  3:23 PM 06/24/2021  ?  8:13 AM 01/07/2021  ?  8:49 AM  ?PHQ 2/9 Scores  ?PHQ - 2 Score 6 0  2  ?PHQ- 9 Score 22 0  5  ?  ? Information is confidential and restricted. Go to Review Flowsheets to unlock data.  ? ? ? ?  01/06/2022  ? 10:28 AM 07/09/2021  ?  3:23 PM 01/07/2021  ?  8:49 AM 04/29/2020  ?  1:25 PM  ?GAD 7 : Generalized Anxiety Score  ?Nervous, Anxious, on Edge 2 0 1 0  ?Control/stop worrying 2 0 1 0  ?Worry too much - different things 2 0 1 0  ?Trouble relaxing 1 0 0 0  ?Restless 0 0 0 0  ?Easily annoyed or irritable 0 0 0 0  ?Afraid - awful might happen 1 0 0 0  ?Total GAD 7 Score 8 0 3 0  ?Anxiety Difficulty Very difficult   Not difficult at all  ? ? ?BP Readings from Last 3  Encounters:  ?01/06/22 (!) 132/96  ?01/07/21 118/88  ?12/10/20 137/90  ? ? ?Physical Exam ?Constitutional:   ?   Appearance: Normal appearance.  ?HENT:  ?   Nose:  ?   Right Sinus: Maxillary sinus tenderness and frontal

## 2022-01-09 ENCOUNTER — Other Ambulatory Visit: Payer: Self-pay | Admitting: Internal Medicine

## 2022-01-09 DIAGNOSIS — I10 Essential (primary) hypertension: Secondary | ICD-10-CM

## 2022-01-10 ENCOUNTER — Other Ambulatory Visit: Payer: Self-pay

## 2022-01-10 ENCOUNTER — Ambulatory Visit: Payer: 59 | Admitting: Internal Medicine

## 2022-01-10 ENCOUNTER — Ambulatory Visit (INDEPENDENT_AMBULATORY_CARE_PROVIDER_SITE_OTHER): Payer: 59 | Admitting: Psychiatry

## 2022-01-10 ENCOUNTER — Encounter: Payer: Self-pay | Admitting: Psychiatry

## 2022-01-10 VITALS — BP 147/94 | HR 71 | Temp 98.5°F | Wt 157.2 lb

## 2022-01-10 DIAGNOSIS — F331 Major depressive disorder, recurrent, moderate: Secondary | ICD-10-CM | POA: Diagnosis not present

## 2022-01-10 DIAGNOSIS — G4700 Insomnia, unspecified: Secondary | ICD-10-CM

## 2022-01-10 MED ORDER — DESVENLAFAXINE SUCCINATE ER 100 MG PO TB24: 100.0000 mg | ORAL_TABLET | Freq: Every morning | ORAL | 0 refills | Status: DC

## 2022-01-10 MED ORDER — QUETIAPINE FUMARATE 50 MG PO TABS: 50.0000 mg | ORAL_TABLET | Freq: Every day | ORAL | 0 refills | Status: DC

## 2022-01-10 NOTE — Patient Instructions (Signed)
Continue Pristiq 100 mg daily -[facility] ?Continue Buspar 7.5 mg twice a day (was taking 7.5 mg daily) ?Increase quetiapine 50 mg at night ?Discontinue propranolol ?Decrease lamotrigine 75 mg daily (was on 150 mg daily) ?Continue topiramate 200 mg daily ?Continue trazodone 100 mg at night as needed for sleep ?Next appointment- 5/18 at 2 PM ?

## 2022-01-11 NOTE — Telephone Encounter (Signed)
Requested medications are due for refill today.  yes ? ?Requested medications are on the active medications list.  yes ? ?Last refill. 12/06/2021 #90 0 refills ? ?Future visit scheduled.   yes ? ?Notes to clinic.  Medication refill failed protocol d/t expired labs. ? ? ? ?Requested Prescriptions  ?Pending Prescriptions Disp Refills  ? irbesartan (AVAPRO) 150 MG tablet [Pharmacy Med Name: Irbesartan 150 MG Oral Tablet] 90 tablet 7  ?  Sig: TAKE 2 TABLETS BY MOUTH DAILY  ?  ? Cardiovascular:  Angiotensin Receptor Blockers Failed - 01/09/2022  7:18 AM  ?  ?  Failed - Cr in normal range and within 180 days  ?  Creatinine  ?Date Value Ref Range Status  ?01/28/2013 0.71 0.60 - 1.30 mg/dL Final  ? ?Creatinine, Ser  ?Date Value Ref Range Status  ?01/07/2021 0.84 0.57 - 1.00 mg/dL Final  ?  ?  ?  ?  Failed - K in normal range and within 180 days  ?  Potassium  ?Date Value Ref Range Status  ?01/07/2021 3.8 3.5 - 5.2 mmol/L Final  ?01/28/2013 3.6 3.5 - 5.1 mmol/L Final  ?  ?  ?  ?  Failed - Last BP in normal range  ?  BP Readings from Last 1 Encounters:  ?01/06/22 (!) 132/96  ?  ?  ?  ?  Passed - Patient is not pregnant  ?  ?  Passed - Valid encounter within last 6 months  ?  Recent Outpatient Visits   ? ?      ? 5 days ago Acute non-recurrent maxillary sinusitis  ? [facility] Glean Hess, MD  ? 6 months ago COVID-19 virus infection  ? [facility] Glean Hess, MD  ? 1 year ago Annual physical exam  ? [facility] Glean Hess, MD  ? 1 year ago Essential (primary) hypertension  ? [facility] Glean Hess, MD  ? 1 year ago Parainfluenza virus pneumonia  ? [facility] Glean Hess, MD  ? ?  ?  ?Future Appointments   ? ?        ? In 3 weeks Glean Hess, MD [facility], [place]  ? ?  ? ?  ?  ?  ?  ?

## 2022-01-12 ENCOUNTER — Other Ambulatory Visit: Payer: Self-pay | Admitting: Internal Medicine

## 2022-01-12 DIAGNOSIS — G2581 Restless legs syndrome: Secondary | ICD-10-CM

## 2022-01-13 ENCOUNTER — Ambulatory Visit: Payer: Self-pay | Admitting: *Deleted

## 2022-01-13 DIAGNOSIS — G2581 Restless legs syndrome: Secondary | ICD-10-CM

## 2022-01-13 NOTE — Telephone Encounter (Signed)
Requested medications are due for refill today.  no ? ?Requested medications are on the active medications list.  yes ? ?Last refill. 12/07/2021 #90 3 refills. ? ?Future visit scheduled.   yes ? ?Notes to clinic.  Please see note form Pt call. Refill not needed at this time ? ? ? ?Requested Prescriptions  ?Pending Prescriptions Disp Refills  ? rOPINIRole (REQUIP) 2 MG tablet [Pharmacy Med Name: ROPINIROLE HCL TABS '2MG'$ ] 90 tablet 3  ?  Sig: TAKE 1 TABLET AT BEDTIME (SCHEDULE A YEARLY APPOINTMENT DUE IN [facility])  ?  ? Neurology:  Parkinsonian Agents Failed - 01/12/2022 12:19 AM  ?  ?  Failed - Last BP in normal range  ?  BP Readings from Last 1 Encounters:  ?01/06/22 (!) 132/96  ?  ?  ?  ?  Passed - Last Heart Rate in normal range  ?  Pulse Readings from Last 1 Encounters:  ?01/06/22 71  ?  ?  ?  ?  Passed - Valid encounter within last 12 months  ?  Recent Outpatient Visits   ? ?      ? 1 week ago Acute non-recurrent maxillary sinusitis  ? [facility] Glean Hess, MD  ? 6 months ago COVID-19 virus infection  ? [facility] Glean Hess, MD  ? 1 year ago Annual physical exam  ? [facility] Glean Hess, MD  ? 1 year ago Essential (primary) hypertension  ? [facility] Glean Hess, MD  ? 1 year ago Parainfluenza virus pneumonia  ? [facility] Glean Hess, MD  ? ?  ?  ?Future Appointments   ? ?        ? In 2 weeks Army Melia Jesse Sans, MD [facility], [place]  ? ?  ? ?  ?  ?  ?  ?

## 2022-01-13 NOTE — Telephone Encounter (Signed)
Called pt - Pt does have new Rx/refills available with [facility]. Pt states that that she did receive a e-mail from [facility] regarding this medication yesterday regarding difficulty with supply. Pt currently has 30+ days of medication remaining. Pt will follow up with [facility] regarding future supply and call back if we need to switch pharmacy.  ?

## 2022-01-13 NOTE — Telephone Encounter (Signed)
Patient calling to report she has received notification from [facility] mail service pharmacy that they are out of stock of ropinirole (requip) '2mg'$ . Patient would like medication refill sent to [facility] , [place]. And requesting a 90 day supply please.  ? ? ? ? ?Reason for Disposition ? [1] Prescription not at pharmacy AND [2] was prescribed by PCP recently  (Exception: triager has access to EMR and prescription is recorded there. Go to Home Care and confirm for pharmacy.) ? ?Answer Assessment - Initial Assessment Questions ?1. DRUG NAME: "What medicine do you need to have refilled?" ?    Requip 2 mg  [facility] mail [facility] pharmacy out of stock of medication ?2. REFILLS REMAINING: "How many refills are remaining?" (Note: The label on the medicine or pill bottle will show how many refills are remaining. If there are no refills remaining, then a renewal may be needed.) ?    3 ?3. EXPIRATION DATE: "What is the expiration date?" (Note: The label states when the prescription will expire, and thus can no longer be refilled.) ?    na ?4. PRESCRIBING HCP: "Who prescribed it?" Reason: If prescribed by specialist, call should be referred to that group. ?    PCP ?5. SYMPTOMS: "Do you have any symptoms?" ?    Yes  ?6. PREGNANCY: "Is there any chance that you are pregnant?" "When was your last menstrual period?" ?    na ? ?Protocols used: Medication Refill and Renewal Call-A-AH ? ?

## 2022-01-14 MED ORDER — ROPINIROLE HCL 2 MG PO TABS: ORAL_TABLET | ORAL | 3 refills | Status: DC

## 2022-01-14 NOTE — Telephone Encounter (Signed)
Requested Prescriptions  ?Pending Prescriptions Disp Refills  ?? rOPINIRole (REQUIP) 2 MG tablet 90 tablet 3  ?  Sig: TAKE 1 TABLET BY MOUTH AT  BEDTIME  ?  ? Neurology:  Parkinsonian Agents Failed - 01/13/2022  3:42 PM  ?  ?  Failed - Last BP in normal range  ?  BP Readings from Last 1 Encounters:  ?01/06/22 (!) 132/96  ?   ?  ?  Passed - Last Heart Rate in normal range  ?  Pulse Readings from Last 1 Encounters:  ?01/06/22 71  ?   ?  ?  Passed - Valid encounter within last 12 months  ?  Recent Outpatient Visits   ?      ? 1 week ago Acute non-recurrent maxillary sinusitis  ? [facility] Glean Hess, MD  ? 6 months ago COVID-19 virus infection  ? [facility] Glean Hess, MD  ? 1 year ago Annual physical exam  ? [facility] Glean Hess, MD  ? 1 year ago Essential (primary) hypertension  ? [facility] Glean Hess, MD  ? 1 year ago Parainfluenza virus pneumonia  ? [facility] Glean Hess, MD  ?  ?  ?Future Appointments   ?        ? In 2 weeks Army Melia Jesse Sans, MD [facility], [place]  ?  ? ?  ?  ?  ? ? ?

## 2022-01-27 ENCOUNTER — Other Ambulatory Visit: Payer: Self-pay | Admitting: Psychiatry

## 2022-01-27 DIAGNOSIS — F5081 Binge eating disorder: Secondary | ICD-10-CM

## 2022-02-01 ENCOUNTER — Encounter: Payer: Self-pay | Admitting: Internal Medicine

## 2022-02-01 ENCOUNTER — Ambulatory Visit (INDEPENDENT_AMBULATORY_CARE_PROVIDER_SITE_OTHER): Payer: 59 | Admitting: Internal Medicine

## 2022-02-01 VITALS — BP 138/94 | HR 78 | Ht 70.0 in | Wt 155.6 lb

## 2022-02-01 DIAGNOSIS — Z1231 Encounter for screening mammogram for malignant neoplasm of breast: Secondary | ICD-10-CM | POA: Diagnosis not present

## 2022-02-01 DIAGNOSIS — E033 Postinfectious hypothyroidism: Secondary | ICD-10-CM

## 2022-02-01 DIAGNOSIS — Z933 Colostomy status: Secondary | ICD-10-CM

## 2022-02-01 DIAGNOSIS — G2581 Restless legs syndrome: Secondary | ICD-10-CM

## 2022-02-01 DIAGNOSIS — Z1211 Encounter for screening for malignant neoplasm of colon: Secondary | ICD-10-CM

## 2022-02-01 DIAGNOSIS — E782 Mixed hyperlipidemia: Secondary | ICD-10-CM | POA: Diagnosis not present

## 2022-02-01 DIAGNOSIS — Z0000 Encounter for general adult medical examination without abnormal findings: Secondary | ICD-10-CM

## 2022-02-01 DIAGNOSIS — I10 Essential (primary) hypertension: Secondary | ICD-10-CM

## 2022-02-01 MED ORDER — HYDROCHLOROTHIAZIDE 25 MG PO TABS: 25.0000 mg | ORAL_TABLET | Freq: Every day | ORAL | 0 refills | Status: DC

## 2022-02-01 NOTE — Patient Instructions (Signed)
For restless leg, try taking a 2 mg Requip plus an extra half to equal 3 mg. ?

## 2022-02-01 NOTE — Progress Notes (Signed)
? ? ?Date:  02/01/2022  ? ?Name:  Toni Green   DOB:  [birth year]   MRN:  [id number] ? ? ?Chief Complaint: Annual Exam ?Toni Green is a 61 y.o. female who presents today for her Complete Annual Exam. She feels poorly. She reports exercising- none. She reports she is sleeping poorly. Breast complaints none.  She is currently on STD due to bowel/fistula issues.  She is very depressed, primarily due to her sister's passing.  She is not working, stays at home and is busy with housework, gardening, etc. ? ?Mammogram: 01/2021 ?DEXA: none ?Pap smear: discontinued ?Colonoscopy: 09/2016 due.   ? ?Health Maintenance Due  ?Topic Date Due  ? COVID-19 Vaccine (1) Never done  ? Zoster Vaccines- Shingrix (1 of 2) Never done  ? MAMMOGRAM  01/21/2022  ?  ?Immunization History  ?Administered Date(s) Administered  ? Influenza-Unspecified 08/01/2015, 08/17/2017, 09/21/2021  ? Tdap 05/25/2016  ? ? ?Hypertension ?This is a chronic problem. The problem is controlled. Pertinent negatives include no chest pain, headaches, palpitations or shortness of breath. Past treatments include angiotensin blockers, beta blockers and diuretics. Identifiable causes of hypertension include a thyroid problem.  ?Thyroid Problem ?Presents for follow-up visit. Patient reports no anxiety, constipation, diarrhea, fatigue, palpitations or tremors. The symptoms have been stable.  ?Restless leg - on requip 2 mg and previously good response but recently worse. ? ?Lab Results  ?Component Value Date  ? NA 141 01/07/2021  ? K 3.8 01/07/2021  ? CO2 21 01/07/2021  ? GLUCOSE 83 01/07/2021  ? BUN 8 01/07/2021  ? CREATININE 0.84 01/07/2021  ? CALCIUM 9.8 01/07/2021  ? EGFR 80 01/07/2021  ? GFRNONAA 77 01/06/2020  ? ?Lab Results  ?Component Value Date  ? CHOL 232 (H) 01/07/2021  ? HDL 97 01/07/2021  ? LDLCALC 126 (H) 01/07/2021  ? TRIG 53 01/07/2021  ? CHOLHDL 2.4 01/07/2021  ? ?Lab Results  ?Component Value Date  ? TSH 1.360 01/07/2021  ? ?No results found for:  HGBA1C ?Lab Results  ?Component Value Date  ? WBC 10.4 01/07/2021  ? HGB 13.6 01/07/2021  ? HCT 41.1 01/07/2021  ? MCV 93 01/07/2021  ? PLT 332 01/07/2021  ? ?Lab Results  ?Component Value Date  ? ALT 43 (H) 01/07/2021  ? AST 30 01/07/2021  ? ALKPHOS 227 (H) 01/07/2021  ? BILITOT 0.2 01/07/2021  ? ?Lab Results  ?Component Value Date  ? VD25OH 39.3 01/07/2021  ?  ? ?Review of Systems  ?Constitutional:  Negative for chills, fatigue and fever.  ?HENT:  Negative for congestion, hearing loss, tinnitus, trouble swallowing and voice change.   ?Eyes:  Negative for visual disturbance.  ?Respiratory:  Negative for cough, chest tightness, shortness of breath and wheezing.   ?Cardiovascular:  Negative for chest pain, palpitations and leg swelling.  ?Gastrointestinal:  Negative for abdominal pain, constipation, diarrhea and vomiting.  ?Endocrine: Negative for polydipsia and polyuria.  ?Genitourinary:  Negative for dysuria, frequency, genital sores, vaginal bleeding and vaginal discharge.  ?Musculoskeletal:  Negative for arthralgias, gait problem and joint swelling.  ?Skin:  Negative for color change and rash.  ?Neurological:  Negative for dizziness, tremors, light-headedness and headaches.  ?Hematological:  Negative for adenopathy. Does not bruise/bleed easily.  ?Psychiatric/Behavioral:  Negative for dysphoric mood and sleep disturbance. The patient is not nervous/anxious.   ? ?Patient Active Problem List  ? Diagnosis Date Noted  ? Mixed hyperlipidemia 02/01/2022  ? Reactive hypoglycemia 12/21/2021  ? 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 MDD (major  depressive disorder), recurrent, in partial remission ([place]) 01/06/2021  ? S/P total hysterectomy 11/25/2020  ? Hemorrhoids   ? Anxiety state 10/14/2019  ? Binge eating disorder 10/14/2019  ? Restless leg syndrome 11/30/2018  ? Bright red rectal bleeding 11/30/2018  ? S/P gastric bypass 08/07/2017  ? Special screening for malignant neoplasms, colon   ? Benign neoplasm of ascending colon    ? Polyp of sigmoid colon   ? Menopause syndrome 07/26/2016  ? 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 Essential (primary) hypertension 07/20/2015  ? 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 DDD (degenerative disc disease), cervical 03/17/2014  ? Calculus of kidney 02/13/2013  ? ? ?Allergies  ?Allergen Reactions  ? Losartan Palpitations  ?  Could feel heartbeat (strongly) in neck  ? Penicillins Rash  ? Tape Rash  ?  Skin irritation after back surgery white paper tape  ? ? ?Past Surgical History:  ?Procedure Laterality Date  ? APPENDECTOMY  2010  ? BACK SURGERY  10/2005  ? lumbar  ? COLONOSCOPY WITH PROPOFOL N/A 09/23/2016  ? Procedure: COLONOSCOPY WITH PROPOFOL;  Surgeon: Lucilla Lame, MD;  Location: [facility];  Service: Endoscopy;  Laterality: N/A;  ? COLOSTOMY  10/22/2021  ? CYSTOSCOPY    ? x 1  ? EVALUATION UNDER ANESTHESIA WITH HEMORRHOIDECTOMY N/A 07/06/2020  ? Procedure: EXAM UNDER ANESTHESIA WITH HEMORRHOIDECTOMY;  Surgeon: Fredirick Maudlin, MD;  Location: [facility];  Service: General;  Laterality: N/A;  ? GALLBLADDER SURGERY  2009  ? lap  ? GASTRIC BYPASS  2008  ? roux en y  ? MUCOSAL ADVANCEMENT FLAP N/A 12/10/2020  ? Procedure: MUCOSAL ADVANCEMENT FLAP;  Surgeon: Leighton Ruff, MD;  Location: [facility];  Service: General;  Laterality: N/A;  60 MIN  ? POLYPECTOMY  09/23/2016  ? Procedure: POLYPECTOMY;  Surgeon: Lucilla Lame, MD;  Location: [facility];  Service: Endoscopy;;  ? TOTAL VAGINAL HYSTERECTOMY  1999  ? ? ?Social History  ? ?Tobacco Use  ? Smoking status: Never  ? Smokeless tobacco: Never  ?Vaping Use  ? Vaping Use: Never used  ?Substance Use Topics  ? Alcohol use: Yes  ?  Alcohol/week: 0.0 standard drinks  ?  Comment: RARE  ? Drug use: No  ? ? ? ?Medication list has been reviewed and updated. ? ?Current Meds  ?Medication Sig  ? Ascorbic Acid (VITAMIN C) 1000 MG tablet Take 1,000 mg by mouth  at bedtime.  ? Blood Glucose Monitoring Suppl (ONETOUCH VERIO REFLECT) w/Device KIT 1 each by Does not apply route daily at 6 (six) AM.  ? cetirizine (ZYRTEC) 10 MG tablet Take 10 mg by mouth as needed.  ? Cholecalciferol (VITAMIN D-3) 125 MCG (5000 UT) TABS Take 5,000 Units by mouth at bedtime.  ? Cyanocobalamin (VITAMIN B-12) 2500 MCG SUBL Place 2,500 mcg under the tongue at bedtime.  ? [START ON 02/07/2022] desvenlafaxine (PRISTIQ) 100 MG 24 hr tablet Take 1 tablet (100 mg total) by mouth every morning.  ? ferrous sulfate 325 (65 FE) MG tablet Take 325 mg by mouth at bedtime.  ? fluticasone (FLONASE) 50 MCG/ACT nasal spray Place 2 sprays into both nostrils daily. (Patient taking differently: Place 2 sprays into both nostrils daily as needed (sinus issues/allergies.).)  ? furosemide (LASIX) 20 MG tablet TAKE 1 TABLET DAILY (Patient taking differently: Take 20 mg by mouth daily as needed (fluid retention.).)  ? glucose blood test strip Use as instructed qid  ?  hydrochlorothiazide (HYDRODIURIL) 25 MG tablet Take 1 tablet (25 mg total) by mouth daily.  ? ibuprofen (ADVIL) 800 MG tablet Take 1 tablet (800 mg total) by mouth every 8 (eight) hours as needed.  ? irbesartan (AVAPRO) 150 MG tablet Take 2 tablets (300 mg total) by mouth daily. (Patient taking differently: Take 300 mg by mouth daily. Once daily)  ? lamoTRIgine (LAMICTAL) 150 MG tablet Take 1 tablet (150 mg total) by mouth every morning. (Patient taking differently: Take 75 mg by mouth every morning.)  ? levothyroxine (SYNTHROID) 75 MCG tablet TAKE 1 TABLET BY MOUTH DAILY  BEFORE BREAKFAST  ? magnesium oxide (MAG-OX) 400 MG tablet Take 400 mg by mouth at bedtime.  ? OneTouch Delica Lancets 98Y MISC 1 each by Does not apply route 4 (four) times daily as needed.  ? propranolol (INDERAL) 20 MG tablet Take 1 tablet (20 mg total) by mouth daily as needed (anxiety).  ? QUEtiapine (SEROQUEL) 50 MG tablet Take 1 tablet (50 mg total) by mouth at bedtime.  ? tamsulosin  (FLOMAX) 0.4 MG CAPS capsule Take 1 capsule (0.4 mg total) by mouth daily as needed. (Patient taking differently: Take 0.4 mg by mouth daily as needed (Kidney stones).)  ? topiramate (TOPAMAX) 100 MG tabl

## 2022-02-02 LAB — LIPID PANEL
Chol/HDL Ratio: 1.9 ratio (ref 0.0–4.4)
Cholesterol, Total: 225 mg/dL — ABNORMAL HIGH (ref 100–199)
HDL: 119 mg/dL (ref >39)
LDL Chol Calc (NIH): 94 mg/dL (ref 0–99)
Triglycerides: 67 mg/dL (ref 0–149)
VLDL Cholesterol Cal: 12 mg/dL (ref 5–40)

## 2022-02-02 LAB — TSH+FREE T4
Free T4: 1.28 ng/dL (ref 0.82–1.77)
TSH: 2.33 u[IU]/mL (ref 0.450–4.500)

## 2022-02-03 ENCOUNTER — Telehealth: Payer: Self-pay

## 2022-02-03 NOTE — Telephone Encounter (Signed)
pt called states that the pharmacy has changed to [facility] and that she needs her buspar 7.'5mg'$  refill for 90 day supply.  ?

## 2022-02-10 ENCOUNTER — Telehealth: Payer: Self-pay

## 2022-02-10 NOTE — Telephone Encounter (Signed)
Called pt could not leave VM. VM was full.  ? ?Called as a reminder to call and schedule mammogram. [phone] ? ?PEC nurse may give results to patient if they return call to clinic, a CRM has been created. ? ?KP ?

## 2022-02-11 NOTE — Telephone Encounter (Signed)
Medication was called in to the pharmacy.  

## 2022-02-12 ENCOUNTER — Other Ambulatory Visit: Payer: Self-pay | Admitting: Psychiatry

## 2022-02-12 DIAGNOSIS — F3341 Major depressive disorder, recurrent, in partial remission: Secondary | ICD-10-CM

## 2022-02-14 ENCOUNTER — Other Ambulatory Visit: Payer: Self-pay | Admitting: Psychiatry

## 2022-02-14 ENCOUNTER — Telehealth: Payer: Self-pay

## 2022-02-14 MED ORDER — BUSPIRONE HCL 7.5 MG PO TABS: 7.5000 mg | ORAL_TABLET | Freq: Two times a day (BID) | ORAL | 0 refills | Status: DC

## 2022-02-14 NOTE — Telephone Encounter (Signed)
Pt called states that [facility] still has not processed the buspar 7.'5mg'$  refill for 90 day supply.  Can you just send the rx [facility] the [facility] she also stated that she needed a 90 day supply of the ropinirole ?

## 2022-02-14 NOTE — Telephone Encounter (Signed)
Ordered. Please advise her that ropinirole has been prescribed by another provider (it seems like she does have refill on that medication as well).

## 2022-02-28 NOTE — Progress Notes (Signed)
BH MD/PA/NP OP Progress Note  03/03/2022 2:42 PM Toni Green  MRN:  [id number]  Chief Complaint:  Chief Complaint  Patient presents with   Follow-up   HPI:  This is a follow-up appointment for depression and insomnia.  She states that she has not had any changes since her last visit.  She has not talked with her mother since last week.  She talks about an episode of her being called a liar in relation to the service of her sister.  She left the house, and has not responded to text messages from her mother.   She does not think she can take it anymore.  She states that her fistula is healing.  She will have another visit in a few weeks.  She does not think she can return to work.  She talks about an example of her driving herself to this appointment.  She was unsure where she was driving, although she has lived in this town for many years.  She feels overwhelmed, anxious and depressed.  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  She has depressive symptoms as in PHQ-9.  She has been eating cookies and ice cream since uptitration of quetiapine, although she has never liked those before.  She agrees with the following medication changes.    Employment: Scientist, water quality, supporting president since 2016, healthcare IT for 30 years Support: husband Household: husband, puppy Marital status: married since 1994 Number of children: 2 grown sons (one from previous marriage, another one from her husband's previous marriage), 2 grandchildren (age 46,13)  [place] Readings from Last 3 Encounters:  03/03/22 158 lb 6.4 oz (71.8 kg)  02/01/22 155 lb 9.6 oz (70.6 kg)  01/10/22 157 lb 3.2 oz (71.3 kg)     Visit Diagnosis: No diagnosis found.  Past Psychiatric History: Please see initial evaluation for full details. I have reviewed the history. No updates at this time.     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   Claustrophobia    Complication of surgery 12/16/9516   Overview:  Dilated pouch found on EGD done 12/2014    Depression    History of kidney stones    Hypertension    Hypoglycemia    Hypothyroidism    Inflammation of sacroiliac joint ([place]) 08/22/2014   Pneumonia 03/2020   PONV (postoperative nausea and vomiting)    NASUEATED/BAD HEADACHES   Seizures ([place]) 1985   x1, after labor/childbirth   Thyroid disease    Wears glasses     Past Surgical History:  Procedure Laterality Date   APPENDECTOMY  2010   BACK SURGERY  10/2005   lumbar   COLONOSCOPY WITH PROPOFOL N/A 09/23/2016   Procedure: COLONOSCOPY WITH PROPOFOL;  Surgeon: Lucilla Lame, MD;  Location: [facility];  Service: Endoscopy;  Laterality: N/A;   COLOSTOMY  10/22/2021   CYSTOSCOPY     x 1   EVALUATION UNDER ANESTHESIA WITH HEMORRHOIDECTOMY N/A 07/06/2020   Procedure: EXAM UNDER ANESTHESIA WITH HEMORRHOIDECTOMY;  Surgeon: Fredirick Maudlin, MD;  Location: [facility];  Service: General;  Laterality: N/A;   GALLBLADDER SURGERY  2009   lap   GASTRIC  BYPASS  2008   roux en y   MUCOSAL ADVANCEMENT FLAP N/A 12/10/2020   Procedure: MUCOSAL ADVANCEMENT FLAP;  Surgeon: Leighton Ruff, MD;  Location: [facility];  Service: General;  Laterality: N/A;  60 MIN   POLYPECTOMY  09/23/2016   Procedure: POLYPECTOMY;  Surgeon: Lucilla Lame, MD;  Location: [facility];  Service: Endoscopy;;   TOTAL VAGINAL HYSTERECTOMY  1999    Family Psychiatric History: Please see initial evaluation for full details. I have reviewed the history. No updates at this time.     Family History:  Family History  Problem Relation Age of Onset   Depression Mother    Hypertension Mother    Depression Father    Hypertension Father    Depression Sister     Breast cancer Sister 71   Hypertension Maternal Grandfather    Depression Maternal Grandmother    Hypertension Maternal Grandmother    Hypertension Paternal Grandfather    Hypertension Paternal Grandmother     Social History:  Social History   Socioeconomic History   Marital status: Married    Spouse name: Not on file   Number of children: Not on file   Years of education: Not on file   Highest education level: Not on file  Occupational History   Not on file  Tobacco Use   Smoking status: Never   Smokeless tobacco: Never  Vaping Use   Vaping Use: Never used  Substance and Sexual Activity   Alcohol use: Yes    Alcohol/week: 0.0 standard drinks    Comment: RARE   Drug use: No   Sexual activity: Not Currently  Other Topics Concern   Not on file  Social History Narrative   Not on file   Social Determinants of Health   Financial Resource Strain: Not on file  Food Insecurity: Not on file  Transportation Needs: Not on file  Physical Activity: Not on file  Stress: Not on file  Social Connections: Not on file    Allergies:  Allergies  Allergen Reactions   Losartan Palpitations    Could feel heartbeat (strongly) in neck   Penicillins Rash   Tape Rash    Skin irritation after back surgery white paper tape    Metabolic Disorder Labs: No results found for: HGBA1C, MPG No results found for: PROLACTIN Lab Results  Component Value Date   CHOL 225 (H) 02/01/2022   TRIG 67 02/01/2022   HDL 119 02/01/2022   CHOLHDL 1.9 02/01/2022   LDLCALC 94 02/01/2022   LDLCALC 126 (H) 01/07/2021   Lab Results  Component Value Date   TSH 2.330 02/01/2022   TSH 1.360 01/07/2021    Therapeutic Level Labs: No results found for: LITHIUM No results found for: VALPROATE No components found for:  CBMZ  Current Medications: Current Outpatient Medications  Medication Sig Dispense Refill   Ascorbic Acid (VITAMIN C) 1000 MG tablet Take 1,000 mg by mouth at bedtime.     Blood  Glucose Monitoring Suppl (ONETOUCH VERIO REFLECT) w/Device KIT 1 each by Does not apply route daily at 6 (six) AM. 1 kit 0   busPIRone (BUSPAR) 7.5 MG tablet Take 1 tablet (7.5 mg total) by mouth 2 (two) times daily. 180 tablet 0   cetirizine (ZYRTEC) 10 MG tablet Take 10 mg by mouth as needed.     Cholecalciferol (VITAMIN D-3) 125 MCG (5000 UT) TABS Take 5,000 Units by mouth at bedtime.     Cyanocobalamin (VITAMIN B-12) 2500 MCG SUBL  Place 2,500 mcg under the tongue at bedtime.     desvenlafaxine (PRISTIQ) 100 MG 24 hr tablet Take 1 tablet (100 mg total) by mouth every morning. 90 tablet 0   ferrous sulfate 325 (65 FE) MG tablet Take 325 mg by mouth at bedtime.     fluticasone (FLONASE) 50 MCG/ACT nasal spray Place 2 sprays into both nostrils daily. (Patient taking differently: Place 2 sprays into both nostrils daily as needed (sinus issues/allergies.).) 16 g 6   furosemide (LASIX) 20 MG tablet TAKE 1 TABLET DAILY (Patient taking differently: Take 20 mg by mouth daily as needed (fluid retention.).) 90 tablet 3   glucose blood test strip Use as instructed qid 100 each 12   hydrochlorothiazide (HYDRODIURIL) 25 MG tablet Take 1 tablet (25 mg total) by mouth daily. 90 tablet 0   ibuprofen (ADVIL) 800 MG tablet Take 1 tablet (800 mg total) by mouth every 8 (eight) hours as needed. 30 tablet 0   irbesartan (AVAPRO) 150 MG tablet Take 2 tablets (300 mg total) by mouth daily. (Patient taking differently: Take 300 mg by mouth daily. Once daily) 90 tablet 0   levothyroxine (SYNTHROID) 75 MCG tablet TAKE 1 TABLET BY MOUTH DAILY  BEFORE BREAKFAST 90 tablet 3   magnesium oxide (MAG-OX) 400 MG tablet Take 400 mg by mouth at bedtime.     OneTouch Delica Lancets 25O MISC 1 each by Does not apply route 4 (four) times daily as needed. 100 each 12   rOPINIRole (REQUIP) 2 MG tablet TAKE 1 TABLET BY MOUTH AT  BEDTIME 90 tablet 3   tamsulosin (FLOMAX) 0.4 MG CAPS capsule Take 1 capsule (0.4 mg total) by mouth daily as  needed. (Patient taking differently: Take 0.4 mg by mouth daily as needed (Kidney stones).) 30 capsule 2   topiramate (TOPAMAX) 100 MG tablet Take 2 tablets (200 mg total) by mouth at bedtime. 180 tablet 1   traMADol (ULTRAM) 50 MG tablet Take 25-50 mg by mouth 2 (two) times daily as needed (back pain.).      traZODone (DESYREL) 100 MG tablet Take 1 tablet (100 mg total) by mouth at bedtime. 90 tablet 1   valACYclovir (VALTREX) 1000 MG tablet as needed.     propranolol (INDERAL) 20 MG tablet Take 1 tablet (20 mg total) by mouth daily as needed (anxiety). 90 tablet 0   No current facility-administered medications for this visit.     Musculoskeletal: Strength & Muscle Tone:  normal Gait & Station: normal Patient leans: N/A  Psychiatric Specialty Exam: Review of Systems  Psychiatric/Behavioral:  Positive for decreased concentration, dysphoric mood and sleep disturbance. Negative for agitation, behavioral problems, confusion, hallucinations, self-injury and suicidal ideas. The patient is nervous/anxious. The patient is not hyperactive.   All other systems reviewed and are negative.  Blood pressure 121/82, pulse 61, temperature 98.1 F (36.7 C), temperature source Temporal, weight 158 lb 6.4 oz (71.8 kg).Body mass index is 22.73 kg/m.  General Appearance: Fairly Groomed  Eye Contact:  Good  Speech:  Clear and Coherent  Volume:  Normal  Mood:  Depressed  Affect:  Appropriate, Congruent, and Tearful  Thought Process:  Coherent  Orientation:  Full (Time, Place, and Person)  Thought Content: Logical   Suicidal Thoughts:  No  Homicidal Thoughts:  No  Memory:  Immediate;   Good  Judgement:  Good  Insight:  Good  Psychomotor Activity:  Normal  Concentration:  Concentration: Good and Attention Span: Good  Recall:  Good  Fund of  Knowledge: Good  Language: Good  Akathisia:  No  Handed:  Right  AIMS (if indicated): not done  Assets:  Communication Skills Desire for Improvement  ADL's:   Intact  Cognition: WNL  Sleep:  Poor   Screenings: GAD-7    Flowsheet Row Office Visit from 02/01/2022 in [facility] Video Visit from 01/06/2022 in [facility] Video Visit from 07/09/2021 in [facility] Office Visit from 01/07/2021 in [facility] Office Visit from 04/29/2020 in [facility]  Total GAD-7 Score 16 8 0 3 0      PHQ2-9    [place] Visit from 03/03/2022 in [place] Office Visit from 02/01/2022 in [facility] Office Visit from 01/10/2022 in [place] Video Visit from 01/06/2022 in [facility] Video Visit from 07/09/2021 in [facility]  PHQ-2 Total Score 6 5 5 6  0  PHQ-9 Total Score 17 15 18 22  0      Flowsheet Row Office Visit from 03/03/2022 in [place] Office Visit from 01/10/2022 in [place] Video Visit from 06/24/2021 in [place] No Risk No Risk No Risk        Assessment and Plan:  Toni Green is a 61 y.o. year old female with a history of depression, binge eating disorder, hypertension, hypothyroidism, s/p roux-en-y gastric bypass in 2008, who presents for follow up appointment for below.    1. MDD (major depressive disorder), recurrent episode, moderate ([place]) She continues to report depressive symptoms since the last visit. Psychosocial stressors includes complication from surgery/having ostomy bag, and mother's medical condition,  loss of her sister in November, and conflict with her son.  She had adverse reaction of craving for sweets since uptitration of quetiapine.  Will discontinue this medication.  Will start bupropion as adjunctive treatment for depression.  Discussed potential risk of headache, worsening in anxiety.  She has no known history of seizure.  Will continue Pristiq to target depression.  Will  discontinue lamotrigine to avoid polypharmacy given she has no episodes consistent with bipolar disorder.  Will restart propranolol as needed given she reports significant benefit from this medication despite drowsiness. Although she will greatly benefit from CBT, she is not interested in this option.   2. Insomnia, unspecified type Unchanged.  Will continue trazodone as needed for insomnia.    # alcohol use Improving.  She denies any alcohol use/craving since surgery.  Will continue motivational interview.    # Binge eating disorder Unchanged.  She does not have any episode of binge eating for the past several years.  Will continue current dose of topiramate at this time; will consider tapering it off in the future if she is not on an antipsychotic to avoid polypharmacy.   This clinician has discussed the side effect associated with medication prescribed during this encounter. Please refer to notes in the previous encounters for more details.      Plan (she will contact the office if she needs any refill) Continue Pristiq 100 mg daily (on ) -[facility] Continue Buspar 7.5 mg twice a day (limited benefit from uptitration) Discontinue quetiapine Start bupropion 75 mg twice a day  Continue propranolol 20 mg daily as needed for anxiety- she declined refill Discontinue lamotrigine (used to be on 150 mg daily) Continue topiramate 200 mg daily Continue trazodone 100 mg at night as needed for sleep Next appointment- 6/20 at 2:30  for 30 mins, in person - on tramadol   Past trials of medication- sertraline, lexapro, venlafaxine, Abilify, quetiapine (eating sweets)  I would support that she will remain out of from work with expected date to return on 8/18.  She has mood symptoms, which has been significantly interfering with her ability to work at her capacity, including, collaborate with other people, and complete tasks.    Collaboration of Care: Collaboration of Care: Other  N/A  Patient/Guardian was advised Release of Information must be obtained prior to any record release in order to collaborate their care with an outside provider. Patient/Guardian was advised if they have not already done so to contact the registration department to sign all necessary forms in order for [place] to release information regarding their care.   Consent: Patient/Guardian gives verbal consent for treatment and assignment of benefits for services provided during this visit. Patient/Guardian expressed understanding and agreed to proceed.    Norman Clay, MD 03/03/2022, 2:42 PM

## 2022-03-02 ENCOUNTER — Encounter: Payer: Self-pay | Admitting: Internal Medicine

## 2022-03-03 ENCOUNTER — Ambulatory Visit (INDEPENDENT_AMBULATORY_CARE_PROVIDER_SITE_OTHER): Payer: 59 | Admitting: Psychiatry

## 2022-03-03 ENCOUNTER — Ambulatory Visit
Admission: RE | Admit: 2022-03-03 | Discharge: 2022-03-03 | Disposition: A | Discharge status: Home / Self Care | Payer: 59 | Source: Ambulatory Visit | Attending: Internal Medicine | Admitting: Internal Medicine

## 2022-03-03 ENCOUNTER — Encounter: Payer: Self-pay | Admitting: Psychiatry

## 2022-03-03 VITALS — BP 121/82 | HR 61 | Temp 98.1°F | Wt 158.4 lb

## 2022-03-03 DIAGNOSIS — Z1231 Encounter for screening mammogram for malignant neoplasm of breast: Secondary | ICD-10-CM | POA: Diagnosis present

## 2022-03-03 DIAGNOSIS — G4700 Insomnia, unspecified: Secondary | ICD-10-CM | POA: Diagnosis not present

## 2022-03-03 DIAGNOSIS — F331 Major depressive disorder, recurrent, moderate: Secondary | ICD-10-CM

## 2022-03-03 MED ORDER — BUPROPION HCL 75 MG PO TABS: 75.0000 mg | ORAL_TABLET | Freq: Two times a day (BID) | ORAL | 1 refills | Status: DC

## 2022-03-03 NOTE — Patient Instructions (Signed)
Continue Pristiq 100 mg daily Continue Buspar 7.5 mg twice a day Discontinue quetiapine Start bupropion 75 mg twice a day  Continue propranolol 20 mg daily as needed for anxiety Discontinue lamotrigine  Continue topiramate 200 mg daily Continue trazodone 100 mg at night as needed for sleep Next appointment- 6/20 at 2:30 , in person

## 2022-03-11 ENCOUNTER — Other Ambulatory Visit: Payer: Self-pay | Admitting: Psychiatry

## 2022-03-11 ENCOUNTER — Telehealth (HOSPITAL_COMMUNITY): Payer: Self-pay | Admitting: *Deleted

## 2022-03-11 DIAGNOSIS — F331 Major depressive disorder, recurrent, moderate: Secondary | ICD-10-CM

## 2022-03-11 NOTE — Telephone Encounter (Signed)
Noted. If she is interested in trying risperidone for depression, anxiety, we can try this. Side effects including increase in appetite (less compared to quetiapine), parkinson like symptoms, although it is not a common side effect.

## 2022-03-11 NOTE — Telephone Encounter (Signed)
PATIENT LVM STATED THAT AS OF 03/10/22 THAT  WOULD BE HER LAST TIME TAKING THAT MEDICATION -- SHE STATED THAT SHE JUST CAN'T TAKE IT.  THE MEDICATION IS buPROPion (WELLBUTRIN) 75 MG tablet Take 1 tablet (75 mg total) by mouth 2 (two) times daily   PATIENT STATED SHE IS NAUSEAS, NERVOUS & JITTERY  SHE STATED SHE JUST WANTED THE DR. TO KNOW THAT SHE WOULDN'T BE TAKEN MEDICATION & IF THE DR. HAS SOMETHING ELSE SHE WOULD LIKE FOR HER TO TRY THAT SHE REQUEST A CALL FROM PROVIDER

## 2022-03-11 NOTE — Telephone Encounter (Signed)
Noted. She can try geodon for depression if interested. It does not have a risk of weight gain. Will monitor for arrhythmia, although the risk is very low unless she has any heart disease. If she is not interested in this, advise her to continue other medication until her next visit.

## 2022-03-14 ENCOUNTER — Other Ambulatory Visit: Payer: Self-pay | Admitting: Psychiatry

## 2022-03-14 DIAGNOSIS — F411 Generalized anxiety disorder: Secondary | ICD-10-CM

## 2022-03-18 ENCOUNTER — Encounter: Payer: Self-pay | Admitting: Internal Medicine

## 2022-03-21 ENCOUNTER — Other Ambulatory Visit: Payer: Self-pay | Admitting: Psychiatry

## 2022-03-21 ENCOUNTER — Telehealth: Payer: Self-pay

## 2022-03-21 NOTE — Telephone Encounter (Signed)
Faxed medical records to Disability Determination Services at [phone]. 

## 2022-03-25 ENCOUNTER — Other Ambulatory Visit: Payer: Self-pay | Admitting: Internal Medicine

## 2022-03-25 DIAGNOSIS — I10 Essential (primary) hypertension: Secondary | ICD-10-CM

## 2022-03-25 MED ORDER — IRBESARTAN 300 MG PO TABS: 300.0000 mg | ORAL_TABLET | Freq: Every day | ORAL | 0 refills | Status: DC

## 2022-03-25 MED ORDER — PRAZOSIN HCL 2 MG PO CAPS: 2.0000 mg | ORAL_CAPSULE | Freq: Every day | ORAL | 0 refills | Status: DC

## 2022-04-01 ENCOUNTER — Other Ambulatory Visit: Payer: Self-pay | Admitting: Psychiatry

## 2022-04-01 DIAGNOSIS — F331 Major depressive disorder, recurrent, moderate: Secondary | ICD-10-CM

## 2022-04-02 ENCOUNTER — Other Ambulatory Visit: Payer: Self-pay | Admitting: Psychiatry

## 2022-04-02 DIAGNOSIS — F331 Major depressive disorder, recurrent, moderate: Secondary | ICD-10-CM

## 2022-04-03 NOTE — Progress Notes (Unsigned)
BH MD/PA/NP OP Progress Note  04/05/2022 3:07 PM Toni Green  MRN:  [id number]  Chief Complaint:  Chief Complaint  Patient presents with   Follow-up   HPI:  This is a follow-up appointment for depression and anxiety.  Toni Green states that Toni Green has been doing the same.  Toni Green tends to stay in the house as Toni Green feels more anxious when Toni Green goes outside.  Toni Green was able to go out eating with her husband and friends, and Toni Green had a good time.  Toni Green agreed to try it more regularly when able.  Toni Green states that Toni Green used to enjoy going to church, ladies meeting, and visiting nursing home with the church members.  Toni Green does not remember the last time Toni Green went to church.  Toni Green feels guilty when Toni Green did not answer when somebody from church visit her the other time.  Toni Green tends to avoid others.  Toni Green thinks Toni Green has been physically doing better; Toni Green states that healing of the fistula.  Toni Green is hoping to get long-term disability.  Toni Green has depressive symptoms as in PHQ-9.  Toni Green has insomnia, which Toni Green attributes to feeling uncomfortable from ostomy bag.  Toni Green has decreased appetite. Toni Green feels frustrated as Toni Green cannot read as Toni Green used to due to difficulty in concentration.   Toni Green denies SI.  Toni Green feels anxious.  Toni Green feels her hands are shaking.  Toni Green could not continue bupropion due to palpitation.  Toni Green restarted lamotrigine, and Toni Green has been feeling better, although Toni Green is unsure if this is because of discontinuation of bupropion.  Toni Green agrees with the medication change as below.    Employment: Scientist, water quality, supporting president since 2016, healthcare IT for 30 years Support: husband Household: husband, puppy Marital status: married since 1994 Number of children: 2 grown sons (one from previous marriage, another one from her husband's previous marriage), 2 grandchildren (age 19,13)   [place] Readings from Last 3 Encounters:  04/05/22 157 lb 12.8 oz (71.6 kg)  03/03/22 158 lb 6.4 oz (71.8 kg)  02/01/22 155 lb 9.6 oz (70.6 kg)      Visit Diagnosis:    ICD-10-CM   1. MDD (major depressive disorder), recurrent episode, moderate (HCC)  F33.1     2. Insomnia, unspecified type  G47.00     3. Binge eating disorder  F50.81       Past Psychiatric History: Please see initial evaluation for full details. I have reviewed the history. No updates at this time.     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   Claustrophobia    Complication of surgery 03/20/4033   Overview:  Dilated pouch found on EGD done 12/2014    Depression    History of kidney stones    Hypertension    Hypoglycemia    Hypothyroidism    Inflammation of sacroiliac joint ([place]) 08/22/2014   Pneumonia 03/2020   PONV (postoperative nausea and vomiting)    NASUEATED/BAD HEADACHES   Seizures ([place]) 1985   x1, after labor/childbirth   Thyroid disease    Wears glasses     Past Surgical History:  Procedure Laterality Date   APPENDECTOMY  2010   BACK SURGERY  10/2005   lumbar  COLONOSCOPY WITH PROPOFOL N/A 09/23/2016   Procedure: COLONOSCOPY WITH PROPOFOL;  Surgeon: Lucilla Lame, MD;  Location: [facility];  Service: Endoscopy;  Laterality: N/A;   COLOSTOMY  10/22/2021   CYSTOSCOPY     x 1   EVALUATION UNDER ANESTHESIA WITH HEMORRHOIDECTOMY N/A 07/06/2020   Procedure: EXAM UNDER ANESTHESIA WITH HEMORRHOIDECTOMY;  Surgeon: Fredirick Maudlin, MD;  Location: [facility];  Service: General;  Laterality: N/A;   GALLBLADDER SURGERY  2009   lap   GASTRIC BYPASS  2008   roux en y   [place] N/A 12/10/2020   Procedure: MUCOSAL ADVANCEMENT FLAP;  Surgeon: Leighton Ruff, MD;  Location: [facility];  Service: General;  Laterality: N/A;  60 MIN   POLYPECTOMY  09/23/2016   Procedure: POLYPECTOMY;  Surgeon:  Lucilla Lame, MD;  Location: [facility];  Service: Endoscopy;;   TOTAL VAGINAL HYSTERECTOMY  1999    Family Psychiatric History: Please see initial evaluation for full details. I have reviewed the history. No updates at this time.     Family History:  Family History  Problem Relation Age of Onset   Depression Mother    Hypertension Mother    Depression Father    Hypertension Father    Depression Sister    Breast cancer Sister 71   Hypertension Maternal Grandfather    Depression Maternal Grandmother    Hypertension Maternal Grandmother    Hypertension Paternal Grandfather    Hypertension Paternal Grandmother     Social History:  Social History   Socioeconomic History   Marital status: Married    Spouse name: Not on file   Number of children: Not on file   Years of education: Not on file   Highest education level: Not on file  Occupational History   Not on file  Tobacco Use   Smoking status: Never   Smokeless tobacco: Never  Vaping Use   Vaping Use: Never used  Substance and Sexual Activity   Alcohol use: Yes    Alcohol/week: 0.0 standard drinks of alcohol    Comment: RARE   Drug use: No   Sexual activity: Not Currently  Other Topics Concern   Not on file  Social History Narrative   Not on file   Social Determinants of Health   Financial Resource Strain: Not on file  Food Insecurity: Not on file  Transportation Needs: Not on file  Physical Activity: Not on file  Stress: Not on file  Social Connections: Not on file    Allergies:  Allergies  Allergen Reactions   Losartan Palpitations    Could feel heartbeat (strongly) in neck   Penicillins Rash   Tape Rash    Skin irritation after back surgery white paper tape    Metabolic Disorder Labs: No results found for: "HGBA1C", "MPG" No results found for: "PROLACTIN" Lab Results  Component Value Date   CHOL 225 (H) 02/01/2022   TRIG 67 02/01/2022   HDL 119 02/01/2022   CHOLHDL 1.9 02/01/2022    LDLCALC 94 02/01/2022   LDLCALC 126 (H) 01/07/2021   Lab Results  Component Value Date   TSH 2.330 02/01/2022   TSH 1.360 01/07/2021    Therapeutic Level Labs: No results found for: "LITHIUM" No results found for: "VALPROATE" No results found for: "CBMZ"  Current Medications: Current Outpatient Medications  Medication Sig Dispense Refill   Ascorbic Acid (VITAMIN C) 1000 MG tablet Take 1,000 mg by mouth at bedtime.     Blood Glucose Monitoring  Suppl (ONETOUCH VERIO REFLECT) w/Device KIT 1 each by Does not apply route daily at 6 (six) AM. 1 kit 0   busPIRone (BUSPAR) 7.5 MG tablet Take 1 tablet (7.5 mg total) by mouth 2 (two) times daily. 180 tablet 0   cetirizine (ZYRTEC) 10 MG tablet Take 10 mg by mouth as needed.     Cholecalciferol (VITAMIN D-3) 125 MCG (5000 UT) TABS Take 5,000 Units by mouth at bedtime.     Cyanocobalamin (VITAMIN B-12) 2500 MCG SUBL Place 2,500 mcg under the tongue at bedtime.     desvenlafaxine (PRISTIQ) 100 MG 24 hr tablet Take 1 tablet (100 mg total) by mouth every morning. 90 tablet 0   ferrous sulfate 325 (65 FE) MG tablet Take 325 mg by mouth at bedtime.     fluticasone (FLONASE) 50 MCG/ACT nasal spray Place 2 sprays into both nostrils daily. (Patient taking differently: Place 2 sprays into both nostrils daily as needed (sinus issues/allergies.).) 16 g 6   furosemide (LASIX) 20 MG tablet TAKE 1 TABLET DAILY (Patient taking differently: Take 20 mg by mouth daily as needed (fluid retention.).) 90 tablet 3   glucose blood test strip Use as instructed qid 100 each 12   ibuprofen (ADVIL) 800 MG tablet Take 1 tablet (800 mg total) by mouth every 8 (eight) hours as needed. 30 tablet 0   irbesartan (AVAPRO) 300 MG tablet Take 1 tablet (300 mg total) by mouth daily. 90 tablet 0   levothyroxine (SYNTHROID) 75 MCG tablet TAKE 1 TABLET BY MOUTH DAILY  BEFORE BREAKFAST 90 tablet 3   magnesium oxide (MAG-OX) 400 MG tablet Take 400 mg by mouth at bedtime.      methocarbamol (ROBAXIN) 500 MG tablet 1/2-1 po qHS prn     OneTouch Delica Lancets 35K MISC 1 each by Does not apply route 4 (four) times daily as needed. 100 each 12   prazosin (MINIPRESS) 2 MG capsule Take 1 capsule (2 mg total) by mouth at bedtime. 90 capsule 0   propranolol (INDERAL) 20 MG tablet TAKE 1 TABLET BY MOUTH DAILY AS  NEEDED FOR ANXIETY 90 tablet 0   rOPINIRole (REQUIP) 2 MG tablet TAKE 1 TABLET BY MOUTH AT  BEDTIME 90 tablet 3   tamsulosin (FLOMAX) 0.4 MG CAPS capsule Take 1 capsule (0.4 mg total) by mouth daily as needed. (Patient taking differently: Take 0.4 mg by mouth daily as needed (Kidney stones).) 30 capsule 2   topiramate (TOPAMAX) 100 MG tablet Take 2 tablets (200 mg total) by mouth at bedtime. 180 tablet 1   traMADol (ULTRAM) 50 MG tablet Take 25-50 mg by mouth 2 (two) times daily as needed (back pain.).      traZODone (DESYREL) 100 MG tablet Take 1 tablet (100 mg total) by mouth at bedtime. 90 tablet 1   valACYclovir (VALTREX) 1000 MG tablet as needed.     ziprasidone (GEODON) 20 MG capsule Take 1 capsule (20 mg total) by mouth every evening. 30 capsule 0   No current facility-administered medications for this visit.     Musculoskeletal: Strength & Muscle Tone: within normal limits Gait & Station: normal Patient leans: N/A  Psychiatric Specialty Exam: Review of Systems  Psychiatric/Behavioral:  Positive for decreased concentration, dysphoric mood and sleep disturbance. Negative for agitation, behavioral problems, confusion, hallucinations, self-injury and suicidal ideas. The patient is nervous/anxious. The patient is not hyperactive.   All other systems reviewed and are negative.   Blood pressure (!) 152/84, pulse (!) 58, temperature 97.9 F (  36.6 C), temperature source Temporal, weight 157 lb 12.8 oz (71.6 kg).Body mass index is 22.64 kg/m.  General Appearance: Fairly Groomed  Eye Contact:  Good  Speech:  Clear and Coherent  Volume:  Normal  Mood:   down   Affect:  Appropriate, Congruent, Tearful, and down  Thought Process:  Coherent  Orientation:  Full (Time, Place, and Person)  Thought Content: Logical   Suicidal Thoughts:  No  Homicidal Thoughts:  No  Memory:  Immediate;   Good  Judgement:  Good  Insight:  Good  Psychomotor Activity:  Normal  Concentration:  Concentration: Good and Attention Span: Good  Recall:  Good  Fund of Knowledge: Good  Language: Good  Akathisia:  No  Handed:  Right  AIMS (if indicated): not done  Assets:  Communication Skills Desire for Improvement  ADL's:  Intact  Cognition: WNL  Sleep:  Poor   Screenings: GAD-7    Flowsheet Row Office Visit from 02/01/2022 in [facility] Video Visit from 01/06/2022 in [facility] Video Visit from 07/09/2021 in [facility] Office Visit from 01/07/2021 in [facility] Office Visit from 04/29/2020 in [facility]  Total GAD-7 Score 16 8 0 3 0      PHQ2-9    [place] Office Visit from 04/05/2022 in [place] Office Visit from 03/03/2022 in [place] Office Visit from 02/01/2022 in [facility] Office Visit from 01/10/2022 in [place] Video Visit from 01/06/2022 in [facility]  PHQ-2 Total Score 6 6 5 5 6   PHQ-9 Total Score 18 17 15 18 22       [place] Office Visit from 04/05/2022 in [place] Office Visit from 03/03/2022 in [place] Office Visit from 01/10/2022 in [place] No Risk No Risk No Risk        Assessment and Plan:  AMORIE RENTZ is a 61 y.o. year old female with a history of depression, binge eating disorder, hypertension, hypothyroidism, s/p roux-en-y gastric bypass in 2008, who presents for follow up appointment for below.    1. MDD (major depressive disorder), recurrent  episode, moderate ([place]) Toni Green continues to report depressive symptoms since the last visit.  Psychosocial stressors includes complication from surgery/having ostomy bag, and mother's medical condition,  loss of her sister in November, and conflict with her son.  Toni Green had adverse reaction of palpitation from bupropion.  Toni Green is concerned of any medication which can potentially cause weight gain.  Will try Geodon as adjunctive treatment for depression.  Discussed potential metabolic side effect, EPS, arrhythmia; Toni Green has no known history of cardiac disease.  Will continue Pristiq to target depression.  Will discontinue lamotrigine to avoid polypharmacy.  Will continue propranolol at this time given Toni Green reports significant benefit from this medication. Although Toni Green will greatly benefit from CBT, Toni Green is not interested in this option.   2. Insomnia, unspecified type Unchanged.  Toni Green attributes insomnia to having stoma.  Will continue trazodone as needed for insomnia.   3. Binge eating disorder Unchanged.  Although Toni Green does not have any episode of binge eating for the past several years, Toni Green wants to continue the medication to avoid relapsing her symptoms.  Will continue current dose of topiramate to target binge eating.     # alcohol use Improving.  Toni Green denies any alcohol use/craving since surgery.  Will continue motivational interview.  This clinician has discussed the side effect associated with medication prescribed during this encounter. Please refer to notes in the previous encounters for more details.       Plan  Continue Pristiq 100 mg daily (generic) - optum Start geodon 20 mg in PM Continue Buspar 7.5 mg twice a day (limited benefit from uptitration) Hold bupropion Continue propranolol 20 mg daily as needed for anxiety- Toni Green declined refill Discontinue lamotrigine  Continue topiramate 200 mg daily Continue trazodone 100 mg at night as needed for sleep Next appointment-  7/20 at 1:30 for 30  mins, in person - on tramadol   Past trials of medication- sertraline, lexapro, venlafaxine, bupropion, bupropion (palpitation), Abilify, quetiapine (eating sweets)   This clinician has discussed the side effect associated with medication prescribed during this encounter. Please refer to notes in the previous encounters for more details.    Collaboration of Care: Collaboration of Care: Other N/A  Patient/Guardian was advised Release of Information must be obtained prior to any record release in order to collaborate their care with an outside provider. Patient/Guardian was advised if they have not already done so to contact the registration department to sign all necessary forms in order for [place] to release information regarding their care.   Consent: Patient/Guardian gives verbal consent for treatment and assignment of benefits for services provided during this visit. Patient/Guardian expressed understanding and agreed to proceed.    Norman Clay, MD 04/05/2022, 3:07 PM

## 2022-04-04 ENCOUNTER — Other Ambulatory Visit: Payer: Self-pay | Admitting: Psychiatry

## 2022-04-04 DIAGNOSIS — F331 Major depressive disorder, recurrent, moderate: Secondary | ICD-10-CM

## 2022-04-05 ENCOUNTER — Encounter: Payer: Self-pay | Admitting: Psychiatry

## 2022-04-05 ENCOUNTER — Ambulatory Visit (INDEPENDENT_AMBULATORY_CARE_PROVIDER_SITE_OTHER): Payer: 59 | Admitting: Psychiatry

## 2022-04-05 VITALS — BP 152/84 | HR 58 | Temp 97.9°F | Wt 157.8 lb

## 2022-04-05 DIAGNOSIS — F331 Major depressive disorder, recurrent, moderate: Secondary | ICD-10-CM

## 2022-04-05 DIAGNOSIS — F5081 Binge eating disorder: Secondary | ICD-10-CM | POA: Diagnosis not present

## 2022-04-05 DIAGNOSIS — G4700 Insomnia, unspecified: Secondary | ICD-10-CM | POA: Diagnosis not present

## 2022-04-05 MED ORDER — ZIPRASIDONE HCL 20 MG PO CAPS: 20.0000 mg | ORAL_CAPSULE | Freq: Every evening | ORAL | 0 refills | Status: DC

## 2022-04-05 NOTE — Patient Instructions (Signed)
Continue Pristiq 100 mg daily  Start geodon 20 mg in PM Continue Buspar 7.5 mg twice a day  Hold bupropion Continue propranolol 20 mg daily as needed for anxiety Discontinue lamotrigine  Continue topiramate 200 mg daily Continue trazodone 100 mg at night as needed for sleep Next appointment-  7/20 at 1:30

## 2022-04-09 ENCOUNTER — Other Ambulatory Visit: Payer: Self-pay | Admitting: Internal Medicine

## 2022-04-09 DIAGNOSIS — I10 Essential (primary) hypertension: Secondary | ICD-10-CM

## 2022-04-25 ENCOUNTER — Other Ambulatory Visit: Payer: Self-pay | Admitting: Psychiatry

## 2022-05-03 ENCOUNTER — Ambulatory Visit: Payer: 59 | Admitting: Internal Medicine

## 2022-05-03 ENCOUNTER — Telehealth: Payer: Self-pay | Admitting: Internal Medicine

## 2022-05-03 ENCOUNTER — Encounter: Payer: Self-pay | Admitting: Internal Medicine

## 2022-05-03 NOTE — Telephone Encounter (Signed)
Duplicate message. Pt sent message via Mychart.   KP

## 2022-05-03 NOTE — Progress Notes (Addendum)
BH MD/PA/NP OP Progress Note  05/05/2022 2:11 PM Toni Green  MRN:  [id number]  Chief Complaint:  Chief Complaint  Patient presents with   Follow-up   HPI:  This is a follow-up appointment for depression and anxiety.  She states that she had weight gain since starting Geodon.  She feels starving, and wakes up in the middle of the night.  She used to have less appetite, and she thinks it is due to medication.  She feels stressed as she was denied long-term disability due to the company margining.  She has been doing physically better; although she still has fistula, she will likely be able to have colostomy reversal after having another procedure.  She feels good about this.  She states that she can smile.  She has not talked with her mother since Mother's Day due to the interaction they had.  She has started cooking.  She loves to cook.  Although she does not feel ready to go outside, her husband will bring food to the neighbors.  Although she used to enjoy reading, doing crossword puzzles or coloring, she cannot do those due to significant difficulty in concentration.  She tries to put them aside.  She tries to spends time outside by the pool.  It reminds her of her sister, who she used to talk on the video call. The patient has mood symptoms as in PHQ-9/GAD-7.  She denies SI.  She denies alcohol or drug use as she wants to stay on tramadol.  She was able to discontinue lamotrigine without any concerns. She agrees with the treatment plans as below.  Employment: Scientist, water quality, supporting president since 2016, healthcare IT for 30 years Support: husband Household: husband, puppy Marital status: married since 1994 Number of children: 2 grown sons (one from previous marriage, another one from her husband's previous marriage), 2 grandchildren (age 72,13)  [place] Readings from Last 3 Encounters:  05/05/22 162 lb 6.4 oz (73.7 kg)  04/05/22 157 lb 12.8 oz (71.6 kg)  03/03/22 158 lb 6.4 oz (71.8  kg)    Visit Diagnosis:    ICD-10-CM   1. Severe episode of recurrent major depressive disorder, without psychotic features ([place])  F33.2     2. Insomnia, unspecified type  G47.00     3. Binge eating disorder  F50.81       Past Psychiatric History: Please see initial evaluation for full details. I have reviewed the history. No updates at this time.     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   Claustrophobia    Complication of surgery 03/17/6072   Overview:  Dilated pouch found on EGD done 12/2014    Depression    History of kidney stones    Hypertension    Hypoglycemia    Hypothyroidism    Inflammation of sacroiliac joint ([place]) 08/22/2014   Pneumonia 03/2020   PONV (postoperative nausea and vomiting)    NASUEATED/BAD HEADACHES   Seizures ([place]) 1985   x1, after labor/childbirth   Thyroid disease    Wears glasses     Past Surgical History:  Procedure Laterality Date   APPENDECTOMY  2010   BACK SURGERY  10/2005   lumbar  COLONOSCOPY WITH PROPOFOL N/A 09/23/2016   Procedure: COLONOSCOPY WITH PROPOFOL;  Surgeon: Lucilla Lame, MD;  Location: [facility];  Service: Endoscopy;  Laterality: N/A;   COLOSTOMY  10/22/2021   CYSTOSCOPY     x 1   EVALUATION UNDER ANESTHESIA WITH HEMORRHOIDECTOMY N/A 07/06/2020   Procedure: EXAM UNDER ANESTHESIA WITH HEMORRHOIDECTOMY;  Surgeon: Fredirick Maudlin, MD;  Location: [facility];  Service: General;  Laterality: N/A;   GALLBLADDER SURGERY  2009   lap   GASTRIC BYPASS  2008   roux en y   [place] N/A 12/10/2020   Procedure: MUCOSAL ADVANCEMENT FLAP;  Surgeon: Leighton Ruff, MD;  Location: [facility];  Service: General;  Laterality: N/A;  60 MIN   POLYPECTOMY  09/23/2016    Procedure: POLYPECTOMY;  Surgeon: Lucilla Lame, MD;  Location: [facility];  Service: Endoscopy;;   TOTAL VAGINAL HYSTERECTOMY  1999    Family Psychiatric History: Please see initial evaluation for full details. I have reviewed the history. No updates at this time.     Family History:  Family History  Problem Relation Age of Onset   Depression Mother    Hypertension Mother    Depression Father    Hypertension Father    Depression Sister    Breast cancer Sister 63   Hypertension Maternal Grandfather    Depression Maternal Grandmother    Hypertension Maternal Grandmother    Hypertension Paternal Grandfather    Hypertension Paternal Grandmother     Social History:  Social History   Socioeconomic History   Marital status: Married    Spouse name: Not on file   Number of children: Not on file   Years of education: Not on file   Highest education level: Not on file  Occupational History   Not on file  Tobacco Use   Smoking status: Never   Smokeless tobacco: Never  Vaping Use   Vaping Use: Never used  Substance and Sexual Activity   Alcohol use: Yes    Alcohol/week: 0.0 standard drinks of alcohol    Comment: RARE   Drug use: No   Sexual activity: Not Currently  Other Topics Concern   Not on file  Social History Narrative   Not on file   Social Determinants of Health   Financial Resource Strain: Not on file  Food Insecurity: Not on file  Transportation Needs: Not on file  Physical Activity: Not on file  Stress: Not on file  Social Connections: Not on file    Allergies:  Allergies  Allergen Reactions   Losartan Palpitations    Could feel heartbeat (strongly) in neck   Penicillins Rash   Tape Rash    Skin irritation after back surgery white paper tape    Metabolic Disorder Labs: No results found for: "HGBA1C", "MPG" No results found for: "PROLACTIN" Lab Results  Component Value Date   CHOL 225 (H) 02/01/2022   TRIG 67 02/01/2022   HDL 119  02/01/2022   CHOLHDL 1.9 02/01/2022   LDLCALC 94 02/01/2022   LDLCALC 126 (H) 01/07/2021   Lab Results  Component Value Date   TSH 2.330 02/01/2022   TSH 1.360 01/07/2021    Therapeutic Level Labs: No results found for: "LITHIUM" No results found for: "VALPROATE" No results found for: "CBMZ"  Current Medications: Current Outpatient Medications  Medication Sig Dispense Refill   Ascorbic Acid (VITAMIN C) 1000 MG tablet Take 1,000 mg by mouth at bedtime.     Blood Glucose Monitoring  Suppl (ONETOUCH VERIO REFLECT) w/Device KIT 1 each by Does not apply route daily at 6 (six) AM. 1 kit 0   cetirizine (ZYRTEC) 10 MG tablet Take 10 mg by mouth as needed.     Cholecalciferol (VITAMIN D-3) 125 MCG (5000 UT) TABS Take 5,000 Units by mouth at bedtime.     Cyanocobalamin (VITAMIN B-12) 2500 MCG SUBL Place 2,500 mcg under the tongue at bedtime.     desvenlafaxine (PRISTIQ) 100 MG 24 hr tablet Take 1 tablet (100 mg total) by mouth every morning. 90 tablet 0   ferrous sulfate 325 (65 FE) MG tablet Take 325 mg by mouth at bedtime.     fluticasone (FLONASE) 50 MCG/ACT nasal spray Place 2 sprays into both nostrils daily. (Patient taking differently: Place 2 sprays into both nostrils daily as needed (sinus issues/allergies.).) 16 g 6   furosemide (LASIX) 20 MG tablet TAKE 1 TABLET DAILY (Patient taking differently: Take 20 mg by mouth daily as needed (fluid retention.).) 90 tablet 3   glucose blood test strip Use as instructed qid 100 each 12   ibuprofen (ADVIL) 800 MG tablet Take 1 tablet (800 mg total) by mouth every 8 (eight) hours as needed. 30 tablet 0   irbesartan (AVAPRO) 300 MG tablet Take 1 tablet (300 mg total) by mouth daily. 90 tablet 0   levothyroxine (SYNTHROID) 75 MCG tablet TAKE 1 TABLET BY MOUTH DAILY  BEFORE BREAKFAST 90 tablet 3   magnesium oxide (MAG-OX) 400 MG tablet Take 400 mg by mouth at bedtime.     methocarbamol (ROBAXIN) 500 MG tablet 1/2-1 po qHS prn     OneTouch Delica  Lancets 53G MISC 1 each by Does not apply route 4 (four) times daily as needed. 100 each 12   prazosin (MINIPRESS) 2 MG capsule Take 1 capsule (2 mg total) by mouth at bedtime. 90 capsule 0   propranolol (INDERAL) 20 MG tablet TAKE 1 TABLET BY MOUTH DAILY AS  NEEDED FOR ANXIETY 90 tablet 0   rOPINIRole (REQUIP) 2 MG tablet TAKE 1 TABLET BY MOUTH AT  BEDTIME 90 tablet 3   tamsulosin (FLOMAX) 0.4 MG CAPS capsule Take 1 capsule (0.4 mg total) by mouth daily as needed. (Patient taking differently: Take 0.4 mg by mouth daily as needed (Kidney stones).) 30 capsule 2   topiramate (TOPAMAX) 100 MG tablet Take 2 tablets (200 mg total) by mouth at bedtime. 180 tablet 1   traMADol (ULTRAM) 50 MG tablet Take 25-50 mg by mouth 2 (two) times daily as needed (back pain.).      traZODone (DESYREL) 100 MG tablet Take 1 tablet (100 mg total) by mouth at bedtime. 90 tablet 1   valACYclovir (VALTREX) 1000 MG tablet as needed.     [START ON 05/16/2022] busPIRone (BUSPAR) 7.5 MG tablet Take 1 tablet (7.5 mg total) by mouth 2 (two) times daily. 180 tablet 0   No current facility-administered medications for this visit.     Musculoskeletal: Strength & Muscle Tone: within normal limits Gait & Station: normal Patient leans: N/A  Psychiatric Specialty Exam: Review of Systems  Psychiatric/Behavioral:  Positive for decreased concentration, dysphoric mood and sleep disturbance. Negative for agitation, behavioral problems, confusion, hallucinations, self-injury and suicidal ideas. The patient is nervous/anxious. The patient is not hyperactive.   All other systems reviewed and are negative.   Blood pressure (!) 145/79, pulse (!) 57, temperature 97.8 F (36.6 C), temperature source Temporal, weight 162 lb 6.4 oz (73.7 kg).Body mass index is 23.3 kg/m.  General Appearance: Fairly Groomed  Eye Contact:  Good  Speech:  Clear and Coherent  Volume:  Normal  Mood:  Anxious and Depressed  Affect:  Appropriate, Congruent,  and Tearful  Thought Process:  Coherent  Orientation:  Full (Time, Place, and Person)  Thought Content: Logical   Suicidal Thoughts:  No  Homicidal Thoughts:  No  Memory:  Immediate;   Good  Judgement:  Good  Insight:  Good  Psychomotor Activity:  Normal  Concentration:  Concentration: Good and Attention Span: Good  Recall:  Good  Fund of Knowledge: Good  Language: Good  Akathisia:  No  Handed:  Right  AIMS (if indicated): not done  Assets:  Communication Skills Desire for Improvement  ADL's:  Intact  Cognition: WNL  Sleep:  Poor   Screenings: GAD-7    Flowsheet Row Office Visit from 05/05/2022 in [facility] Office Visit from 02/01/2022 in [facility] Video Visit from 01/06/2022 in [facility] Video Visit from 07/09/2021 in [facility] Office Visit from 01/07/2021 in [facility]  Total GAD-7 Score _0 0 3      PHQ2-9    [place] Office Visit from 05/05/2022 in [facility] Office Visit from 04/05/2022 in [facility] Office Visit from 03/03/2022 in [facility] Office Visit from 02/01/2022 in [facility] Office Visit from 01/10/2022 in [facility]  PHQ-2 Total Score _1 PHQ-9 Total Score _2 [place] Office Visit from 05/05/2022 in [facility] Office Visit from 04/05/2022 in [facility] Office Visit from 03/03/2022 in [facility] No Risk No Risk No Risk        Assessment and Plan:  Toni Green is a 61 y.o. year old female with a history of depression, binge eating disorder, hypertension, hypothyroidism, s/p roux-en-y gastric bypass in 2008, who presents for follow up appointment for below.   1. Severe episode of recurrent major depressive  disorder, without psychotic features ([place]) She continues to report depressive symptoms, and had adverse reaction of increase in appetite/weight gain from Geodon. Psychosocial stressors includes complication from surgery/having ostomy bag, and mother's medical condition,  loss of her sister in November, and conflict with her son.  Will discontinue Geodon due to its adverse reaction.  Will not try antipsychotics at this time due to her significant concern of weight gain.  She is willing to look into [place] if this is feasible option for her treatment.  Will continue Pristiq to target depression.  Will continue propranolol as needed for anxiety given she reports significant benefit from this medication. Although she will greatly benefit from CBT, she is not interested in this option.   2. Insomnia, unspecified type Unstable.  We uptitrate trazodone as needed for insomnia.  Discussed potential risk of drowsiness.   3. Binge eating disorder She had worsening in increasing in appetite in the context of starting Geodon.  Will discontinue this medication.  Will continue current dose of topiramate to target binge eating.    # alcohol use Improving.  She denies any alcohol use/craving since surgery.  Will continue motivational interview.     Plan  Continue Pristiq 100 mg daily (generic) - optum Discontinue Geodon Continue Buspar 7.5 mg twice a day (limited benefit from uptitration) Continue propranolol 20 mg daily as needed  for anxiety- she declined refill Continue topiramate 200 mg daily Increase trazodone 150 mg at night as needed for sleep; she states that she has enough meds left Next appointment-  8/14 at 1 PM for 30 mins, in person She will look into [facility]. She was advised to contact the office for referral if she is interested. - on tramadol   Past trials of medication- sertraline, lexapro, venlafaxine, bupropion (palpitation), Abilify, quetiapine (eating sweets), Geodon (increase in  appetite)  This clinician has discussed the side effect associated with medication prescribed during this encounter. Please refer to notes in the previous encounters for more details.     Collaboration of Care: Collaboration of Care: Other N/A  Patient/Guardian was advised Release of Information must be obtained prior to any record release in order to collaborate their care with an outside provider. Patient/Guardian was advised if they have not already done so to contact the registration department to sign all necessary forms in order for [place] to release information regarding their care.   Consent: Patient/Guardian gives verbal consent for treatment and assignment of benefits for services provided during this visit. Patient/Guardian expressed understanding and agreed to proceed.    Norman Clay, MD 05/05/2022, 2:11 PM

## 2022-05-03 NOTE — Telephone Encounter (Signed)
Copied from [place]. Topic: General - Inquiry >> May 03, 2022  7:53 AM Leilani Able wrote: Reason for CRM: Pt has called wanting to chg appt to virtual today/FU pt states has cuff at home take BP frequent/ pls call to confirm virtual. Pt sick during night/ [phone]

## 2022-05-05 ENCOUNTER — Encounter: Payer: Self-pay | Admitting: Psychiatry

## 2022-05-05 ENCOUNTER — Other Ambulatory Visit: Payer: Self-pay | Admitting: Psychiatry

## 2022-05-05 ENCOUNTER — Ambulatory Visit (INDEPENDENT_AMBULATORY_CARE_PROVIDER_SITE_OTHER): Payer: 59 | Admitting: Psychiatry

## 2022-05-05 VITALS — BP 145/79 | HR 57 | Temp 97.8°F | Wt 162.4 lb

## 2022-05-05 DIAGNOSIS — F5081 Binge eating disorder: Secondary | ICD-10-CM

## 2022-05-05 DIAGNOSIS — F332 Major depressive disorder, recurrent severe without psychotic features: Secondary | ICD-10-CM | POA: Diagnosis not present

## 2022-05-05 DIAGNOSIS — F331 Major depressive disorder, recurrent, moderate: Secondary | ICD-10-CM

## 2022-05-05 DIAGNOSIS — G4700 Insomnia, unspecified: Secondary | ICD-10-CM | POA: Diagnosis not present

## 2022-05-05 MED ORDER — BUSPIRONE HCL 7.5 MG PO TABS: 7.5000 mg | ORAL_TABLET | Freq: Two times a day (BID) | ORAL | 0 refills | Status: DC

## 2022-05-05 NOTE — Patient Instructions (Signed)
Continue Pristiq 100 mg daily (generic)  Discontinue Geodon Continue Buspar 7.5 mg twice a day  Continue propranolol 20 mg daily as needed for anxiety Continue topiramate 200 mg daily Increase trazodone 150 mg at night as needed for sleep Next appointment-  8/14 at 1 PM

## 2022-05-13 ENCOUNTER — Other Ambulatory Visit: Payer: Self-pay | Admitting: Internal Medicine

## 2022-05-13 ENCOUNTER — Encounter: Payer: Self-pay | Admitting: Internal Medicine

## 2022-05-13 DIAGNOSIS — G2581 Restless legs syndrome: Secondary | ICD-10-CM

## 2022-05-13 MED ORDER — ROPINIROLE HCL 2 MG PO TABS: 4.0000 mg | ORAL_TABLET | Freq: Every day | ORAL | 0 refills | Status: DC

## 2022-05-17 ENCOUNTER — Other Ambulatory Visit: Payer: Self-pay | Admitting: Psychiatry

## 2022-05-17 ENCOUNTER — Other Ambulatory Visit: Payer: Self-pay

## 2022-05-17 DIAGNOSIS — G2581 Restless legs syndrome: Secondary | ICD-10-CM

## 2022-05-17 DIAGNOSIS — F3341 Major depressive disorder, recurrent, in partial remission: Secondary | ICD-10-CM

## 2022-05-17 MED ORDER — ROPINIROLE HCL 2 MG PO TABS: 2.0000 mg | ORAL_TABLET | Freq: Every day | ORAL | 0 refills | Status: DC

## 2022-05-19 ENCOUNTER — Encounter: Payer: Self-pay | Admitting: Internal Medicine

## 2022-05-24 ENCOUNTER — Other Ambulatory Visit: Payer: Self-pay | Admitting: Psychiatry

## 2022-05-24 DIAGNOSIS — F411 Generalized anxiety disorder: Secondary | ICD-10-CM

## 2022-05-24 NOTE — Telephone Encounter (Signed)
Defer to Dr.Hisada 

## 2022-05-25 ENCOUNTER — Telehealth: Payer: Self-pay

## 2022-05-25 NOTE — Telephone Encounter (Signed)
pt called states that [facility] from the disability states that she may not get approved. they wants to set up a time with you and their doctors to discuss and see what can be done.  ( they are saying that nothing much has been done to help her with her depression and anxiety issues.) they mention something about her seeing a counselor.  I did advised her to check with her insurance and see if they can give her a list of therapist in her network just in case.  She said that you can email the lady below to give her some time that you would be able to speak with their doctors.     [email or web address]

## 2022-05-25 NOTE — Telephone Encounter (Signed)
Could you ask her if they are available to talk at 1 30 PM,  or at 4 PM tomorrow? If either of the time works,  please block that slot for this. Thanks.

## 2022-05-26 ENCOUNTER — Encounter: Payer: Self-pay | Admitting: Psychiatry

## 2022-05-26 ENCOUNTER — Ambulatory Visit (INDEPENDENT_AMBULATORY_CARE_PROVIDER_SITE_OTHER): Payer: 59 | Admitting: Psychiatry

## 2022-05-26 VITALS — BP 149/107 | HR 56 | Temp 97.9°F | Wt 164.2 lb

## 2022-05-26 DIAGNOSIS — G4700 Insomnia, unspecified: Secondary | ICD-10-CM

## 2022-05-26 DIAGNOSIS — F5081 Binge eating disorder: Secondary | ICD-10-CM | POA: Diagnosis not present

## 2022-05-26 DIAGNOSIS — F332 Major depressive disorder, recurrent severe without psychotic features: Secondary | ICD-10-CM | POA: Diagnosis not present

## 2022-05-26 NOTE — Telephone Encounter (Signed)
Email sent waiting on response.

## 2022-05-26 NOTE — Progress Notes (Signed)
BH MD/PA/NP OP Progress Note  05/26/2022 11:58 AM Toni Green  MRN:  [id number]  Chief Complaint:  Chief Complaint  Patient presents with   Follow-up   HPI:  This is a follow-up appointment for depression and anxiety.  She states that she was told by insurance that she did "not meet the criteria."  She does not know what she should do, and she sobbed.  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  She is also concerned about the financial strain given her husband is out of work.  She has depressive symptoms as in PHQ-9.  She has significant difficulty in concentration and fatigue.  Although she denies SI, she feels she is "almost there."  She later states that she does not think she will do anything to herself.  She wants to feel better.  She could not continue higher dose of trazodone as it caused her drowsiness.  So she still struggles with insomnia, she does not like her medication to be changed due to difficulty in getting off from Ambien in the past.  She does not want any medication change at this time as her mood would worsen if she were to have weight gain.  It took so much for her to lose weight.  Although she initially declined to proceed with [place], she is open for the referral after discussing more details at length.  She is also willing to see a therapist now.  She will do anything to get better.  She drinks 1 or 2 beers once a week unless.  She denies drug use except CBD gummies for insomnia.   Her husband presents to the interview.  He states that her mind is not there.  She has been reclusive lately, and she does not share with him as much.  He showed me a video of her sleeping in hammock, while constantly moving her leg.  He is concerned about her focus; she cannot recall anything.   Wt Readings from Last 3 Encounters:  05/26/22 164 lb 3.2 oz (74.5 kg)  05/05/22 162 lb 6.4 oz (73.7 kg)  04/05/22 157  lb 12.8 oz (71.6 kg)     Visit Diagnosis:    ICD-10-CM   1. Severe episode of recurrent major depressive disorder, without psychotic features ([place])  F33.2 Ambulatory referral to Psychology    2. Insomnia, unspecified type  G47.00     3. Binge eating disorder  F50.81       Past Psychiatric History: Please see initial evaluation for full details. I have reviewed the history. No updates at this time.     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   Claustrophobia    Complication of surgery 02/17/6467   Overview:  Dilated pouch found on EGD done 12/2014    Depression    History of kidney stones    Hypertension    Hypoglycemia    Hypothyroidism    Inflammation of sacroiliac joint ([place]) 08/22/2014   Pneumonia 03/2020   PONV (postoperative nausea and vomiting)    NASUEATED/BAD HEADACHES   Seizures ([place]) 1985   x1, after  labor/childbirth   Thyroid disease    Wears glasses     Past Surgical History:  Procedure Laterality Date   APPENDECTOMY  2010   BACK SURGERY  10/2005   lumbar   COLONOSCOPY WITH PROPOFOL N/A 09/23/2016   Procedure: COLONOSCOPY WITH PROPOFOL;  Surgeon: Lucilla Lame, MD;  Location: [facility];  Service: Endoscopy;  Laterality: N/A;   COLOSTOMY  10/22/2021   CYSTOSCOPY     x 1   EVALUATION UNDER ANESTHESIA WITH HEMORRHOIDECTOMY N/A 07/06/2020   Procedure: EXAM UNDER ANESTHESIA WITH HEMORRHOIDECTOMY;  Surgeon: Fredirick Maudlin, MD;  Location: [facility];  Service: General;  Laterality: N/A;   GALLBLADDER SURGERY  2009   lap   GASTRIC BYPASS  2008   roux en y   [place] N/A 12/10/2020   Procedure: MUCOSAL ADVANCEMENT FLAP;  Surgeon: Leighton Ruff, MD;  Location: [facility];  Service: General;  Laterality:  N/A;  60 MIN   POLYPECTOMY  09/23/2016   Procedure: POLYPECTOMY;  Surgeon: Lucilla Lame, MD;  Location: [facility];  Service: Endoscopy;;   TOTAL VAGINAL HYSTERECTOMY  1999    Family Psychiatric History: Please see initial evaluation for full details. I have reviewed the history. No updates at this time.     Family History:  Family History  Problem Relation Age of Onset   Depression Mother    Hypertension Mother    Depression Father    Hypertension Father    Depression Sister    Breast cancer Sister 44   Hypertension Maternal Grandfather    Depression Maternal Grandmother    Hypertension Maternal Grandmother    Hypertension Paternal Grandfather    Hypertension Paternal Grandmother     Social History:  Social History   Socioeconomic History   Marital status: Married    Spouse name: Not on file   Number of children: Not on file   Years of education: Not on file   Highest education level: Not on file  Occupational History   Not on file  Tobacco Use   Smoking status: Never   Smokeless tobacco: Never  Vaping Use   Vaping Use: Never used  Substance and Sexual Activity   Alcohol use: Yes    Alcohol/week: 0.0 standard drinks of alcohol    Comment: RARE   Drug use: No   Sexual activity: Not Currently  Other Topics Concern   Not on file  Social History Narrative   Not on file   Social Determinants of Health   Financial Resource Strain: Not on file  Food Insecurity: Not on file  Transportation Needs: Not on file  Physical Activity: Not on file  Stress: Not on file  Social Connections: Not on file    Allergies:  Allergies  Allergen Reactions   Losartan Palpitations    Could feel heartbeat (strongly) in neck   Penicillins Rash   Tape Rash    Skin irritation after back surgery white paper tape    Metabolic Disorder Labs: No results found for: "HGBA1C", "MPG" No results found for: "PROLACTIN" Lab Results  Component Value Date   CHOL 225 (H)  02/01/2022   TRIG 67 02/01/2022   HDL 119 02/01/2022   CHOLHDL 1.9 02/01/2022   LDLCALC 94 02/01/2022   LDLCALC 126 (H) 01/07/2021   Lab Results  Component Value Date   TSH 2.330 02/01/2022   TSH 1.360 01/07/2021    Therapeutic Level Labs: No results found for: "LITHIUM" No results found for: "VALPROATE" No results  found for: "CBMZ"  Current Medications: Current Outpatient Medications  Medication Sig Dispense Refill   Ascorbic Acid (VITAMIN C) 1000 MG tablet Take 1,000 mg by mouth at bedtime.     Blood Glucose Monitoring Suppl (ONETOUCH VERIO REFLECT) w/Device KIT 1 each by Does not apply route daily at 6 (six) AM. 1 kit 0   busPIRone (BUSPAR) 7.5 MG tablet Take 1 tablet (7.5 mg total) by mouth 2 (two) times daily. 180 tablet 0   cetirizine (ZYRTEC) 10 MG tablet Take 10 mg by mouth as needed.     Cholecalciferol (VITAMIN D-3) 125 MCG (5000 UT) TABS Take 5,000 Units by mouth at bedtime.     Cyanocobalamin (VITAMIN B-12) 2500 MCG SUBL Place 2,500 mcg under the tongue at bedtime.     desvenlafaxine (PRISTIQ) 100 MG 24 hr tablet Take 1 tablet (100 mg total) by mouth every morning. 90 tablet 1   ferrous sulfate 325 (65 FE) MG tablet Take 325 mg by mouth at bedtime.     fluticasone (FLONASE) 50 MCG/ACT nasal spray Place 2 sprays into both nostrils daily. (Patient taking differently: Place 2 sprays into both nostrils daily as needed (sinus issues/allergies.).) 16 g 6   furosemide (LASIX) 20 MG tablet TAKE 1 TABLET DAILY (Patient taking differently: Take 20 mg by mouth daily as needed (fluid retention.).) 90 tablet 3   glucose blood test strip Use as instructed qid 100 each 12   ibuprofen (ADVIL) 800 MG tablet Take 1 tablet (800 mg total) by mouth every 8 (eight) hours as needed. 30 tablet 0   irbesartan (AVAPRO) 300 MG tablet Take 1 tablet (300 mg total) by mouth daily. 90 tablet 0   levothyroxine (SYNTHROID) 75 MCG tablet TAKE 1 TABLET BY MOUTH DAILY  BEFORE BREAKFAST 90 tablet 3    magnesium oxide (MAG-OX) 400 MG tablet Take 400 mg by mouth at bedtime.     methocarbamol (ROBAXIN) 500 MG tablet 1/2-1 po qHS prn     OneTouch Delica Lancets 39Q MISC 1 each by Does not apply route 4 (four) times daily as needed. 100 each 12   prazosin (MINIPRESS) 2 MG capsule Take 1 capsule (2 mg total) by mouth at bedtime. 90 capsule 0   propranolol (INDERAL) 20 MG tablet Take 1 tablet (20 mg total) by mouth daily as needed. for anxiety 90 tablet 0   rOPINIRole (REQUIP) 2 MG tablet Take 1 tablet (2 mg total) by mouth at bedtime. 180 tablet 0   tamsulosin (FLOMAX) 0.4 MG CAPS capsule Take 1 capsule (0.4 mg total) by mouth daily as needed. (Patient taking differently: Take 0.4 mg by mouth daily as needed (Kidney stones).) 30 capsule 2   topiramate (TOPAMAX) 100 MG tablet Take 2 tablets (200 mg total) by mouth at bedtime. 180 tablet 1   traMADol (ULTRAM) 50 MG tablet Take 25-50 mg by mouth 2 (two) times daily as needed (back pain.).      traZODone (DESYREL) 100 MG tablet Take 1 tablet (100 mg total) by mouth at bedtime. 90 tablet 1   valACYclovir (VALTREX) 1000 MG tablet as needed.     No current facility-administered medications for this visit.     Musculoskeletal: Strength & Muscle Tone: within normal limits Gait & Station: normal Patient leans: N/A  Psychiatric Specialty Exam: Review of Systems  Psychiatric/Behavioral:  Positive for decreased concentration, dysphoric mood and sleep disturbance. Negative for agitation, behavioral problems, confusion, hallucinations, self-injury and suicidal ideas. The patient is nervous/anxious. The patient is not hyperactive.  All other systems reviewed and are negative.   Blood pressure (!) 149/107, pulse (!) 56, temperature 97.9 F (36.6 C), temperature source Temporal, weight 164 lb 3.2 oz (74.5 kg).Body mass index is 23.56 kg/m.  General Appearance: Fairly Groomed  Eye Contact:  Good  Speech:  Clear and Coherent  Volume:  Normal  Mood:   Anxious and Depressed  Affect:  Appropriate, Congruent, and Tearful  Thought Process:  Coherent  Orientation:  Full (Time, Place, and Person)  Thought Content: Logical   Suicidal Thoughts:  No  Homicidal Thoughts:  No  Memory:  Immediate;   Fair  Judgement:  Good  Insight:  Good  Psychomotor Activity:  Normal  Concentration:  Concentration: Poor and Attention Span: Poor  Recall:  [place] of Knowledge: Good  Language: Good  Akathisia:  No  Handed:  Right  AIMS (if indicated): not done  Assets:  Communication Skills Desire for Improvement  ADL's:  Intact  Cognition: WNL  Sleep:  Poor   Screenings: GAD-7    Flowsheet Row Office Visit from 05/26/2022 in [place] Office Visit from 05/05/2022 in [place] Office Visit from 02/01/2022 in [facility] Video Visit from 01/06/2022 in [facility] Video Visit from 07/09/2021 in [facility]  Total GAD-7 Score 21 21 16 8  0      PHQ2-9    [place] Office Visit from 05/26/2022 in [place] Office Visit from 05/05/2022 in [place] Office Visit from 04/05/2022 in [place] Office Visit from 03/03/2022 in [place] Office Visit from 02/01/2022 in [facility]  PHQ-2 Total Score 6 6 6 6 5   PHQ-9 Total Score 24 21 18 17 15       [place] Office Visit from 05/26/2022 in [place] Office Visit from 05/05/2022 in [place] Office Visit from 04/05/2022 in [place] No Risk No Risk No Risk        Assessment and Plan:  Toni Green is a 61 y.o. year old female with a history of depression, binge eating disorder, hypertension, hypothyroidism, s/p roux-en-y gastric bypass in 2008, who presents for follow up  appointment for below.   1. Severe episode of recurrent major depressive disorder, without psychotic features ([place]) Exam is notable for tearfulness, and she often requires to take time to compose herself during the visit.  She reports significant depressive symptoms as in PHQ-9.  Psychosocial stressors includes financial strain, complication from surgery/having ostomy bag, and mother's medical condition,  loss of her sister in November, and conflict with her son.  She is willing for [place] referral.  She would like to stay on the current medication regimen as she is concerned of any potential side effect, which can cause weight gain.  Will continue Pristiq to target depression.  Will continue BuSpar for anxiety.  Will continue propranolol as needed for anxiety given she reports significant benefit from this medication.  Discussed risk of bradycardia/fatigue.  She will greatly benefit from CBT; she is willing to try this time.   2. Insomnia, unspecified type Unstable.  She had adverse reaction of drowsiness from higher dose of trazodone.  Will continue lower dose at this time to target insomnia.   3. Binge eating disorder Unchanged.  Will continue current dose of topiramate to target binge eating.   # alcohol use Improving.  She denies  any alcohol use/craving since surgery.  Will continue motivational interview.   She has significant mood symptoms as described above.  The symptoms interfere with her ability to work at her capacity.  She is unable to go to work regularly, complete tasks, or effectively collaborate with others.  I will recommend she is out of work for 3 months. This would allow her to complete [place].       Plan  Continue Pristiq 100 mg daily (generic) - optum Continue Buspar 7.5 mg twice a day (limited benefit from uptitration) Continue propranolol 20 mg daily as needed for anxiety- she declined refill Continue topiramate 200 mg daily Continue trazodone 100 mg at night as needed for  sleep; she states that she has enough meds left Referral to therapy  Referral to [facility] [place] Next appointment-  9/12 at 8 AM for 30 mins, in person - on tramadol   Past trials of medication- sertraline, lexapro, venlafaxine, bupropion (palpitation), Abilify, quetiapine (eating sweets), Geodon (increase in appetite). Ambien   This clinician has discussed the side effect associated with medication prescribed during this encounter. Please refer to notes in the previous encounters for more details.         Collaboration of Care: Collaboration of Care: Other N/A  Patient/Guardian was advised Release of Information must be obtained prior to any record release in order to collaborate their care with an outside provider. Patient/Guardian was advised if they have not already done so to contact the registration department to sign all necessary forms in order for [place] to release information regarding their care.   Consent: Patient/Guardian gives verbal consent for treatment and assignment of benefits for services provided during this visit. Patient/Guardian expressed understanding and agreed to proceed.   The duration of this appointment visit was 42 minutes of face-to-face time with the patient.  Greater than 50% of this time was spent in counseling, explanation of  diagnosis, planning of further management, and coordination of care.    Norman Clay, MD 05/26/2022, 11:58 AM

## 2022-05-26 NOTE — Patient Instructions (Signed)
Continue Pristiq 100 mg daily  Continue Buspar 7.5 mg twice a day Continue propranolol 20 mg daily as needed for anxiety Continue topiramate 200 mg daily Continue trazodone 100 mg at night as needed for sleep Referral to therapy  Referral to [facility] [place] Next appointment-  9/12 at 8 AM

## 2022-05-27 NOTE — Telephone Encounter (Signed)
Received ROI

## 2022-05-30 ENCOUNTER — Ambulatory Visit: Payer: 59 | Admitting: Psychiatry

## 2022-06-01 ENCOUNTER — Telehealth: Payer: Self-pay | Admitting: Psychiatry

## 2022-06-01 NOTE — Telephone Encounter (Signed)
Discussed with DR. Dr. Vista Deck [phone] in regards to the care/for disability evaluation.

## 2022-06-04 ENCOUNTER — Other Ambulatory Visit: Payer: Self-pay | Admitting: Internal Medicine

## 2022-06-04 DIAGNOSIS — I10 Essential (primary) hypertension: Secondary | ICD-10-CM

## 2022-06-06 ENCOUNTER — Ambulatory Visit (INDEPENDENT_AMBULATORY_CARE_PROVIDER_SITE_OTHER): Payer: 59 | Admitting: Clinical

## 2022-06-06 ENCOUNTER — Encounter: Payer: Self-pay | Admitting: Clinical

## 2022-06-06 DIAGNOSIS — F331 Major depressive disorder, recurrent, moderate: Secondary | ICD-10-CM

## 2022-06-06 NOTE — Progress Notes (Deleted)
                Bristol Osentoski, LCSW 
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
## 2022-06-06 NOTE — Telephone Encounter (Signed)
Requested Prescriptions  Pending Prescriptions Disp Refills  . irbesartan (AVAPRO) 300 MG tablet [Pharmacy Med Name: Irbesartan 300 MG Oral Tablet] 90 tablet 1    Sig: TAKE 1 TABLET BY MOUTH DAILY     Cardiovascular:  Angiotensin Receptor Blockers Failed - 06/04/2022 10:46 AM      Failed - Last BP in normal range    BP Readings from Last 1 Encounters:  02/01/22 (!) 138/94         Passed - Cr in normal range and within 180 days    Creatinine  Date Value Ref Range Status  12/28/2021 0.8 0.5 - 1.1 Final  01/28/2013 0.71 0.60 - 1.30 mg/dL Final   Creatinine, Ser  Date Value Ref Range Status  01/07/2021 0.84 0.57 - 1.00 mg/dL Final         Passed - K in normal range and within 180 days    Potassium  Date Value Ref Range Status  12/28/2021 4.1 3.5 - 5.1 mEq/L Final  01/28/2013 3.6 3.5 - 5.1 mmol/L Final         Passed - Patient is not pregnant      Passed - Valid encounter within last 6 months    Recent Outpatient Visits          4 months ago Annual physical exam   [place] [facility] at [facility], Jesse Sans, MD   5 months ago Acute non-recurrent maxillary sinusitis   [place] [facility] at [facility], Jesse Sans, MD   11 months ago COVID-19 virus infection   [place] [facility] at [facility], Jesse Sans, MD   1 year ago Annual physical exam   [place] [facility] at [facility], Jesse Sans, MD   1 year ago Essential (primary) hypertension   [place] [facility] at [facility], Jesse Sans, MD      Future Appointments            In 2 weeks Army Melia Jesse Sans, MD [facility] and [facility] Medicine at [facility], [place]           . prazosin (MINIPRESS) 2 MG capsule [Pharmacy Med Name: PRAZOSIN HYDROCHLORIDE  '2MG'$   CAP] 90 capsule 1    Sig: TAKE 1 CAPSULE BY MOUTH AT  BEDTIME      Cardiovascular:  Alpha Blockers Failed - 06/04/2022 10:46 AM      Failed - Last BP in normal range    BP Readings from Last 1 Encounters:  02/01/22 (!) 138/94         Passed - Valid encounter within last 6 months    Recent Outpatient Visits          4 months ago Annual physical exam   [place] [facility] at [facility], Jesse Sans, MD   5 months ago Acute non-recurrent maxillary sinusitis   [place] [facility] at [facility], Jesse Sans, MD   11 months ago COVID-19 virus infection   [facility] at [facility], Jesse Sans, MD   1 year ago Annual physical exam   [place] and Sports Medicine at [facility], Jesse Sans, MD   1 year ago Essential (primary) hypertension   [place] [facility] at [facility]  Robert Bellow, MD      Future Appointments            In 2 weeks Army Melia Jesse Sans, MD [facility] and Sports Medicine at [facility], [facility]

## 2022-06-06 NOTE — Progress Notes (Deleted)
   Established Patient Office Visit  Subjective   [name], female    DOB: 1961-07-16  Age: 61 y.o. MRN: [id number]  No chief complaint on file.   HPI  {History (Optional):23778}  ROS    Objective:     There were no vitals taken for this visit. {Vitals History (Optional):23777}  Physical Exam   No results found for any visits on 06/06/22.  {Labs (Optional):23779}  The ASCVD Risk score (Arnett DK, et al., 2019) failed to calculate for the following reasons:   The valid HDL cholesterol range is 20 to 100 mg/dL    Assessment & Plan:   Problem List Items Addressed This Visit   None Visit Diagnoses     Major depressive disorder, recurrent episode, moderate ([place])    -  Primary       No follow-ups on file.    Katherina Right, LCSW                Katherina Right, [place]

## 2022-06-16 ENCOUNTER — Ambulatory Visit (INDEPENDENT_AMBULATORY_CARE_PROVIDER_SITE_OTHER): Payer: 59 | Admitting: Clinical

## 2022-06-16 DIAGNOSIS — F331 Major depressive disorder, recurrent, moderate: Secondary | ICD-10-CM

## 2022-06-16 NOTE — Progress Notes (Addendum)
[facility] Counselor/Therapist Progress Note  [name], MRN: [id number]    Date: 06/16/22  Time Spent: 9:30  am - 10:30 am : 60 Minutes  Treatment Type: Individual Therapy.  Reported Symptoms: Patient stated, "I'm still feeling extremely stressed about my financial situation". Patient reported her mood is "ok". Patient reported previously she could watch a movie with her husband and now she is not able to watch a movie due to decreased concentration.  Patient reported changes in memory, increased anxiety when around others, and worry.   Mental Status Exam: Appearance:  Neat     Behavior: Appropriate  Motor: Normal  Speech/Language:  Clear and Coherent  Affect: Tearful  Mood: sad  Thought process: normal  Thought content:   WNL  Sensory/Perceptual disturbances:   WNL  Orientation: oriented to person, place, and time/date  Attention: Good  Concentration: Good  Memory: WNL  Fund of knowledge:  Good  Insight:   Good  Judgment:  Good  Impulse Control: Good   Risk Assessment: Danger to Self:  No Patient denied current suicidal ideation, homicidal ideation, or symptoms of psychosis.  Self-injurious Behavior: No Danger to Others: No Duty to Warn:no Physical Aggression / Violence:No  Access to Firearms a concern: Yes , patient reported fire arms in the home Gang Involvement:No   Subjective:  Patient reported plan to continue to see Dr. Modesta Messing and reported she would like to continue with therapy. Patient reported fear that the long term disability claim will be denied. Patient reported at times when she is asked a question she experiences difficulty recalling the words she wants to use in the conversation. Patient reported feeling anxious 5 out of 7 days per week and increased anxiety when around others. Patient reported feeling her heart racing during session when  talking about being around others. Patient reported worry about finances triggers  feelings of anxiety and reported her husband is now paying the bills. Patient reported previously feeling confident and reported feeling she has lost her confidence.  Interventions:  Clinician obtained verbal consent from patient to proceed with therapy session. Clinician conducted face to face session via webex video from clinician's office at [facility]. Patient participated in session from her home. Clinician reviewed recommendations with patient and provided psycho education related to diagnosis of Major Depressive Disorder and Cognitive Behavioral Therapy. Clinician utilized a task centered approach in collaboration with patient to develop treatment plan.   Diagnosis:  Major Depressive Disorder, recurrent, moderate R/O Generalized Anxiety Disorder R/O Social Anxiety Disorder   Treatment Plan: Patient is to utilize Public relations account executive Therapy, Dialectical Behavior Therapy Skills, and coping strategies to help decrease symptoms associated with Anxiety and Major Depressive Disorder.   Frequency: Weekly  Modality: individual therapy   Long-term goal:   Patient stated, "I would like to be my old confident self again" as evidence by decrease in hands shaking, knees shaking, voice quivering, and feelings of anxiety from every time there is a social interaction to 0 times when there is a social interaction   Target Date: 06/17/23  Progress: progressing   Decrease in depressive symptoms as evidence by decrease in crying, loss of interest, lack of energy, difficulty falling asleep/staying asleep from 6 to 7 days per week to 0 to 1 days per week for duration of 6 months to 1 year   Target Date: 06/17/23  Progress: progressing   Short-term goal:  Develop understanding of the relationship between loss of interest in  activities to depression and anxiety and the impact on patient's thought patterns and behaviors   Target Date: 12/16/22  Progress: progressing   Develop  understanding between patient's thoughts and feelings, such as, automatic negative thoughts, core beliefs, and worry on patient's level of confidence.    Target Date: 12/16/22  Progress: progressing     Chryl Heck. [place], LCSW                    Katherina Right, [place]

## 2022-06-17 ENCOUNTER — Ambulatory Visit: Payer: 59 | Admitting: Internal Medicine

## 2022-06-22 ENCOUNTER — Ambulatory Visit (INDEPENDENT_AMBULATORY_CARE_PROVIDER_SITE_OTHER): Payer: 59 | Admitting: Internal Medicine

## 2022-06-22 ENCOUNTER — Encounter: Payer: Self-pay | Admitting: Internal Medicine

## 2022-06-22 VITALS — BP 138/88 | HR 89 | Ht 70.0 in | Wt 162.0 lb

## 2022-06-22 DIAGNOSIS — I10 Essential (primary) hypertension: Secondary | ICD-10-CM

## 2022-06-22 DIAGNOSIS — D649 Anemia, unspecified: Secondary | ICD-10-CM | POA: Diagnosis not present

## 2022-06-22 DIAGNOSIS — Z23 Encounter for immunization: Secondary | ICD-10-CM | POA: Diagnosis not present

## 2022-06-22 DIAGNOSIS — G2581 Restless legs syndrome: Secondary | ICD-10-CM

## 2022-06-22 DIAGNOSIS — Z933 Colostomy status: Secondary | ICD-10-CM

## 2022-06-22 MED ORDER — ROPINIROLE HCL 4 MG PO TABS: 4.0000 mg | ORAL_TABLET | Freq: Every day | ORAL | 1 refills | Status: DC

## 2022-06-22 NOTE — Progress Notes (Signed)
Date:  06/22/2022   Name:  Toni Green   DOB:  [birth year]   MRN:  [id number]   Chief Complaint: Hypertension, Anemia (Was 9.9 in March, fatigue all the time, and cold ), and Flu Vaccine  Hypertension This is a chronic problem. The problem is controlled. Pertinent negatives include no chest pain, headaches, palpitations or shortness of breath. Past treatments include diuretics, angiotensin blockers, beta blockers and alpha 1 blockers. There are no compliance problems.  There is no history of kidney disease, CAD/MI or CVA.  Anemia Presents for follow-up visit. There has been no abdominal pain, light-headedness or palpitations. Signs of blood loss that are not present include hematemesis and hematochezia. Compliance with medications: taking iron supplement.  Depression        This is a chronic problem.The problem is unchanged.  Associated symptoms include no fatigue and no headaches. RLS - she has increased the Requip to 4 mg at bedtime and needs a new Rx to reflect the change.   Lab Results  Component Value Date   NA 139 12/28/2021   K 4.1 12/28/2021   CO2 26 (A) 12/28/2021   GLUCOSE 83 01/07/2021   BUN 14 12/28/2021   CREATININE 0.8 12/28/2021   CALCIUM 9.8 01/07/2021   EGFR 90 12/28/2021   GFRNONAA 77 01/06/2020   Lab Results  Component Value Date   CHOL 225 (H) 02/01/2022   HDL 119 02/01/2022   LDLCALC 94 02/01/2022   TRIG 67 02/01/2022   CHOLHDL 1.9 02/01/2022   Lab Results  Component Value Date   TSH 2.330 02/01/2022   No results found for: "HGBA1C" Lab Results  Component Value Date   WBC 3.4 12/28/2021   HGB 9.9 (A) 12/28/2021   HCT 31 (A) 12/28/2021   MCV 93 01/07/2021   PLT 157 12/28/2021   Lab Results  Component Value Date   ALT 15 12/28/2021   AST 17 12/28/2021   ALKPHOS 119 12/28/2021   BILITOT 0.2 01/07/2021   Lab Results  Component Value Date   VD25OH 39.3 01/07/2021     Review of Systems  Constitutional:  Negative for fatigue and  unexpected weight change.  HENT:  Negative for nosebleeds.   Eyes:  Negative for visual disturbance.  Respiratory:  Negative for cough, chest tightness, shortness of breath and wheezing.   Cardiovascular:  Negative for chest pain, palpitations and leg swelling.  Gastrointestinal:  Negative for abdominal pain, constipation, diarrhea, hematemesis and hematochezia.  Neurological:  Negative for dizziness, weakness, light-headedness and headaches.  Psychiatric/Behavioral:  Positive for depression, dysphoric mood and sleep disturbance. The patient is nervous/anxious.     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/07/2017   Special screening for malignant neoplasms, colon    Benign neoplasm of ascending colon    Polyp of sigmoid colon    Menopause syndrome 07/26/2016   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   Essential (primary) hypertension 07/20/2015   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 disorder 07/20/2015   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   DDD (degenerative disc disease),  cervical 03/17/2014   Calculus of kidney 02/13/2013    Allergies  Allergen Reactions   Losartan Palpitations    Could feel heartbeat (strongly) in neck   Penicillins Rash   Tape Rash    Skin irritation after back surgery white paper tape    Past Surgical History:  Procedure Laterality Date   APPENDECTOMY  2010   BACK SURGERY  10/2005   lumbar   COLONOSCOPY WITH PROPOFOL N/A 09/23/2016   Procedure: COLONOSCOPY WITH PROPOFOL;   Surgeon: Lucilla Lame, MD;  Location: [place];  Service: Endoscopy;  Laterality: N/A;   COLOSTOMY  10/22/2021   CYSTOSCOPY     x 1   EVALUATION UNDER ANESTHESIA WITH HEMORRHOIDECTOMY N/A 07/06/2020   Procedure: EXAM UNDER ANESTHESIA WITH HEMORRHOIDECTOMY;  Surgeon: Fredirick Maudlin, MD;  Location: [facility];  Service: General;  Laterality: N/A;   GALLBLADDER SURGERY  2009   lap   GASTRIC BYPASS  2008   roux en y   MUCOSAL ADVANCEMENT FLAP N/A 12/10/2020   Procedure: MUCOSAL ADVANCEMENT FLAP;  Surgeon: Leighton Ruff, MD;  Location: [facility];  Service: General;  Laterality: N/A;  60 MIN   POLYPECTOMY  09/23/2016   Procedure: POLYPECTOMY;  Surgeon: Lucilla Lame, MD;  Location: [place];  Service: Endoscopy;;   TOTAL VAGINAL HYSTERECTOMY  1999    Social History   Tobacco Use   Smoking status: Never   Smokeless tobacco: Never  Vaping Use   Vaping Use: Never used  Substance Use Topics   Alcohol use: Yes    Alcohol/week: 0.0 standard drinks of alcohol    Comment: RARE   Drug use: No     Medication list has been reviewed and updated.  Current Meds  Medication Sig   Ascorbic Acid (VITAMIN C) 1000 MG tablet Take 1,000 mg by mouth at bedtime.   Blood Glucose Monitoring Suppl (ONETOUCH VERIO REFLECT) w/Device KIT 1 each by Does not apply route daily at 6 (six) AM.   busPIRone (BUSPAR) 7.5 MG tablet Take 1 tablet (7.5 mg total) by mouth 2 (two) times daily.   cetirizine (ZYRTEC) 10 MG tablet Take 10 mg by mouth as needed.   Cholecalciferol (VITAMIN D-3) 125 MCG (5000 UT) TABS Take 5,000 Units by mouth at bedtime.   Cyanocobalamin (VITAMIN B-12) 2500 MCG SUBL Place 2,500 mcg under the tongue at bedtime.   desvenlafaxine (PRISTIQ) 100 MG 24 hr tablet Take 1 tablet (100 mg total) by mouth every morning.   ferrous sulfate 325 (65 FE) MG tablet Take 325 mg by mouth at bedtime.   fluticasone (FLONASE) 50 MCG/ACT nasal spray Place 2 sprays into both  nostrils daily. (Patient taking differently: Place 2 sprays into both nostrils daily as needed (sinus issues/allergies.).)   furosemide (LASIX) 20 MG tablet TAKE 1 TABLET DAILY (Patient taking differently: Take 20 mg by mouth daily as needed (fluid retention.).)   glucose blood test strip Use as instructed qid   ibuprofen (ADVIL) 800 MG tablet Take 1 tablet (800 mg total) by mouth every 8 (eight) hours as needed.   irbesartan (AVAPRO) 300 MG tablet TAKE 1 TABLET BY MOUTH DAILY (Patient taking differently: Take 150 mg by mouth daily.)   levothyroxine (SYNTHROID) 75 MCG tablet TAKE 1 TABLET BY MOUTH DAILY  BEFORE BREAKFAST   magnesium oxide (MAG-OX) 400 MG tablet Take 400 mg by mouth at bedtime.   methocarbamol (ROBAXIN) 500 MG tablet 1/2-1 po qHS prn   OneTouch Delica Lancets 36U MISC 1 each  by Does not apply route 4 (four) times daily as needed.   prazosin (MINIPRESS) 2 MG capsule TAKE 1 CAPSULE BY MOUTH AT  BEDTIME   propranolol (INDERAL) 20 MG tablet Take 1 tablet (20 mg total) by mouth daily as needed. for anxiety   tamsulosin (FLOMAX) 0.4 MG CAPS capsule Take 1 capsule (0.4 mg total) by mouth daily as needed. (Patient taking differently: Take 0.4 mg by mouth daily as needed (Kidney stones).)   topiramate (TOPAMAX) 100 MG tablet Take 2 tablets (200 mg total) by mouth at bedtime.   traMADol (ULTRAM) 50 MG tablet Take 25-50 mg by mouth 2 (two) times daily as needed (back pain.).    traZODone (DESYREL) 100 MG tablet Take 1 tablet (100 mg total) by mouth at bedtime.   valACYclovir (VALTREX) 1000 MG tablet as needed.   [DISCONTINUED] rOPINIRole (REQUIP) 2 MG tablet Take 1 tablet (2 mg total) by mouth at bedtime. (Patient taking differently: Take 4 mg by mouth at bedtime.)       06/22/2022    8:33 AM 05/26/2022   10:48 AM 05/05/2022    1:28 PM 02/01/2022   10:42 AM  GAD 7 : Generalized Anxiety Score  Nervous, Anxious, on Edge 3   3  Control/stop worrying 3   3  Worry too much - different things 3    3  Trouble relaxing 3   2  Restless 3   1  Easily annoyed or irritable 3   1  Afraid - awful might happen 3   3  Total GAD 7 Score 21   16  Anxiety Difficulty Not difficult at all   Extremely difficult     Information is confidential and restricted. Go to Review Flowsheets to unlock data.       06/22/2022    8:32 AM 05/26/2022   10:48 AM 05/05/2022    1:27 PM  Depression screen PHQ 2/9  Decreased Interest 3    Down, Depressed, Hopeless 3    PHQ - 2 Score 6    Altered sleeping 3    Tired, decreased energy 3    Change in appetite 3    Feeling bad or failure about yourself  1    Trouble concentrating 3    Moving slowly or fidgety/restless 2    Suicidal thoughts 0    PHQ-9 Score 21    Difficult doing work/chores Extremely dIfficult       Information is confidential and restricted. Go to Review Flowsheets to unlock data.    BP Readings from Last 3 Encounters:  06/22/22 138/88  02/01/22 (!) 138/94  01/06/22 (!) 132/96    Physical Exam Vitals and nursing note reviewed.  Constitutional:      General: She is not in acute distress.    Appearance: Normal appearance. She is well-developed.  HENT:     Head: Normocephalic and atraumatic.  Cardiovascular:     Rate and Rhythm: Normal rate and regular rhythm.     Pulses: Normal pulses.     Heart sounds: No murmur heard. Pulmonary:     Effort: Pulmonary effort is normal. No respiratory distress.     Breath sounds: No wheezing or rhonchi.  Musculoskeletal:     Cervical back: Normal range of motion.     Right lower leg: No edema.     Left lower leg: No edema.  Skin:    General: Skin is warm and dry.     Findings: No rash.  Neurological:  General: No focal deficit present.     Mental Status: She is alert and oriented to person, place, and time.  Psychiatric:        Attention and Perception: Attention normal.        Mood and Affect: Mood is depressed.        Behavior: Behavior normal.     Wt Readings from Last 3  Encounters:  06/22/22 162 lb (73.5 kg)  02/01/22 155 lb 9.6 oz (70.6 kg)  01/07/21 169 lb (76.7 kg)    BP 138/88 (BP Location: Right Arm, Cuff Size: Normal)   Pulse 89   Ht _0  (1.778 m)   Wt 162 lb (73.5 kg)   SpO2 99%   BMI 23.24 kg/m   Assessment and Plan: 1. Essential (primary) hypertension BP fair control. Continue current medications   2. Restless leg syndrome - rOPINIRole (REQUIP) 4 MG tablet; Take 1 tablet (4 mg total) by mouth at bedtime.  Dispense: 90 tablet; Refill: 1  3. Anemia, unspecified type Continue iron supplement and B12 and take with juice.  - CBC with Differential/Platelet - Vitamin B12 - Iron, TIBC and Ferritin Panel  4. Need for immunization against influenza - Flu Vaccine QUAD 87moIM (Fluarix, Fluzone & Alfiuria Quad PF)  5. Presence of sigmoid colostomy ([place] is healing and she may be able to have the colostomy take down later this fall.   Partially dictated using DEditor, commissioning Any errors are unintentional.  LHalina Maidens MD [facility]  06/22/2022

## 2022-06-23 LAB — IRON,TIBC AND FERRITIN PANEL
Ferritin: 102 ng/mL (ref 15–150)
Iron Saturation: 46 % (ref 15–55)
Iron: 148 ug/dL (ref 27–159)
Total Iron Binding Capacity: 319 ug/dL (ref 250–450)
UIBC: 171 ug/dL (ref 131–425)

## 2022-06-23 LAB — CBC WITH DIFFERENTIAL/PLATELET
Basophils Absolute: 0 10*3/uL (ref 0.0–0.2)
Basos: 1 %
EOS (ABSOLUTE): 0.1 10*3/uL (ref 0.0–0.4)
Eos: 2 %
Hematocrit: 41.4 % (ref 34.0–46.6)
Hemoglobin: 13.7 g/dL (ref 11.1–15.9)
Immature Grans (Abs): 0 10*3/uL (ref 0.0–0.1)
Immature Granulocytes: 0 %
Lymphocytes Absolute: 1.4 10*3/uL (ref 0.7–3.1)
Lymphs: 31 %
MCH: 31.3 pg (ref 26.6–33.0)
MCHC: 33.1 g/dL (ref 31.5–35.7)
MCV: 95 fL (ref 79–97)
Monocytes Absolute: 0.5 10*3/uL (ref 0.1–0.9)
Monocytes: 10 %
Neutrophils Absolute: 2.5 10*3/uL (ref 1.4–7.0)
Neutrophils: 56 %
Platelets: 211 10*3/uL (ref 150–450)
RBC: 4.38 x10E6/uL (ref 3.77–5.28)
RDW: 11.8 % (ref 11.7–15.4)
WBC: 4.5 10*3/uL (ref 3.4–10.8)

## 2022-06-23 LAB — VITAMIN B12: Vitamin B-12: >2000 pg/mL — ABNORMAL HIGH (ref 232–1245)

## 2022-06-24 ENCOUNTER — Ambulatory Visit: Payer: 59 | Admitting: Clinical

## 2022-06-24 ENCOUNTER — Other Ambulatory Visit: Payer: Self-pay | Admitting: Psychiatry

## 2022-06-26 NOTE — Progress Notes (Unsigned)
[place] MD/PA/NP OP Progress Note  06/28/2022 8:38 AM Toni Green  MRN:  [id number]  Chief Complaint:  Chief Complaint  Patient presents with   Follow-up   Depression   HPI:  This is a follow-up appointment for depression and insomnia.  She states that long-term disability has been approved.  However, she needs to maintain her insurance for upcoming surgery and [place].  She will try to figure out the way, though it is very expensive.  She has started cooking once a week, and bring it to the neighbor.  She likes doing it.  Although she tries to read a book, she cannot read due to difficulty in concentration.  She has middle insomnia.  She hopes to sleep better as she wonders she may not cry all day long if she were to have better sleep.  She tends to play games on the phone when she wakes up in the middle of the night.  Discussed sleep hygiene.  She has started to do counseling after she saw a therapist.  She enjoys swimming in the pool during the day.  The patient has mood symptoms as in PHQ-9/GAD-7.  She denies SI, in that she wants to feel better.  She drinks a few beers only on weekend.  She uses CBD gummies for sleep once a month or less. She is hoping to get TMS after reversal of colostomy is done.    Wt Readings from Last 3 Encounters:  06/28/22 164 lb (74.4 kg)  06/22/22 162 lb (73.5 kg)  05/26/22 164 lb 3.2 oz (74.5 kg)     Visit Diagnosis:    ICD-10-CM   1. Severe episode of recurrent major depressive disorder, without psychotic features ([place])  F33.2     2. Insomnia, unspecified type  G47.00     3. Binge eating disorder  F50.81       Past Psychiatric History: Please see initial evaluation for full details. I have reviewed the history. No updates at this time.     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   Claustrophobia    Complication of surgery 06/21/6386   Overview:  Dilated pouch found on EGD done 12/2014    Depression    History of kidney stones    Hypertension    Hypoglycemia    Hypothyroidism    Inflammation of sacroiliac joint ([place]) 08/22/2014   Pneumonia 03/2020   PONV (postoperative nausea and vomiting)    NASUEATED/BAD HEADACHES   Seizures ([place]) 1985   x1, after labor/childbirth   Thyroid disease    Wears glasses     Past Surgical History:  Procedure Laterality Date   APPENDECTOMY  2010   BACK SURGERY  10/2005   lumbar   COLONOSCOPY WITH PROPOFOL N/A 09/23/2016   Procedure: COLONOSCOPY WITH PROPOFOL;  Surgeon: Lucilla Lame, MD;  Location: [place];  Service: Endoscopy;  Laterality: N/A;   COLOSTOMY  10/22/2021   CYSTOSCOPY     x 1   EVALUATION UNDER ANESTHESIA WITH HEMORRHOIDECTOMY N/A 07/06/2020   Procedure: EXAM UNDER ANESTHESIA WITH HEMORRHOIDECTOMY;  Surgeon: Fredirick Maudlin, MD;  Location: [facility];  Service: General;  Laterality: N/A;   GALLBLADDER SURGERY  2009  lap   GASTRIC BYPASS  2008   roux en y   MUCOSAL ADVANCEMENT FLAP N/A 12/10/2020   Procedure: MUCOSAL ADVANCEMENT FLAP;  Surgeon: Leighton Ruff, MD;  Location: [facility];  Service: General;  Laterality: N/A;  60 MIN   POLYPECTOMY  09/23/2016   Procedure: POLYPECTOMY;  Surgeon: Lucilla Lame, MD;  Location: [place];  Service: Endoscopy;;   TOTAL VAGINAL HYSTERECTOMY  1999    Family Psychiatric History: Please see initial evaluation for full details. I have reviewed the history. No updates at this time.     Family History:  Family History  Problem Relation Age of Onset   Depression Mother    Hypertension Mother    Depression Father    Hypertension Father    Depression Sister    Breast cancer Sister 44   Hypertension Maternal Grandfather    Depression Maternal Grandmother    Hypertension Maternal  Grandmother    Hypertension Paternal Grandfather    Hypertension Paternal Grandmother     Social History:  Social History   Socioeconomic History   Marital status: Married    Spouse name: Not on file   Number of children: Not on file   Years of education: Not on file   Highest education level: Not on file  Occupational History   Not on file  Tobacco Use   Smoking status: Never   Smokeless tobacco: Never  Vaping Use   Vaping Use: Never used  Substance and Sexual Activity   Alcohol use: Yes    Alcohol/week: 0.0 standard drinks of alcohol    Comment: RARE   Drug use: No   Sexual activity: Not Currently  Other Topics Concern   Not on file  Social History Narrative   Not on file   Social Determinants of Health   Financial Resource Strain: Low Risk  (06/22/2022)   Overall Financial Resource Strain (CARDIA)    Difficulty of Paying Living Expenses: Not hard at all  Food Insecurity: No Food Insecurity (06/22/2022)   Hunger Vital Sign    Worried About Running Out of Food in the Last Year: Never true    Ran Out of Food in the Last Year: Never true  Transportation Needs: No Transportation Needs (06/22/2022)   PRAPARE - Hydrologist (Medical): No    Lack of Transportation (Non-Medical): No  Physical Activity: Not on file  Stress: Not on file  Social Connections: Not on file    Allergies:  Allergies  Allergen Reactions   Losartan Palpitations    Could feel heartbeat (strongly) in neck   Penicillins Rash   Tape Rash    Skin irritation after back surgery white paper tape    Metabolic Disorder Labs: No results found for: "HGBA1C", "MPG" No results found for: "PROLACTIN" Lab Results  Component Value Date   CHOL 225 (H) 02/01/2022   TRIG 67 02/01/2022   HDL 119 02/01/2022   CHOLHDL 1.9 02/01/2022   LDLCALC 94 02/01/2022   LDLCALC 126 (H) 01/07/2021   Lab Results  Component Value Date   TSH 2.330 02/01/2022   TSH 1.360 01/07/2021     Therapeutic Level Labs: No results found for: "LITHIUM" No results found for: "VALPROATE" No results found for: "CBMZ"  Current Medications: Current Outpatient Medications  Medication Sig Dispense Refill   Ascorbic Acid (VITAMIN C) 1000 MG tablet Take 1,000 mg by mouth at bedtime.     Blood Glucose Monitoring Suppl (ONETOUCH VERIO REFLECT) w/Device  KIT 1 each by Does not apply route daily at 6 (six) AM. 1 kit 0   cetirizine (ZYRTEC) 10 MG tablet Take 10 mg by mouth as needed.     Cholecalciferol (VITAMIN D-3) 125 MCG (5000 UT) TABS Take 5,000 Units by mouth at bedtime.     Cyanocobalamin (VITAMIN B-12) 2500 MCG SUBL Place 2,500 mcg under the tongue at bedtime.     desvenlafaxine (PRISTIQ) 100 MG 24 hr tablet Take 1 tablet (100 mg total) by mouth every morning. 90 tablet 1   ferrous sulfate 325 (65 FE) MG tablet Take 325 mg by mouth at bedtime.     fluticasone (FLONASE) 50 MCG/ACT nasal spray Place 2 sprays into both nostrils daily. (Patient taking differently: Place 2 sprays into both nostrils daily as needed (sinus issues/allergies.).) 16 g 6   furosemide (LASIX) 20 MG tablet TAKE 1 TABLET DAILY (Patient taking differently: Take 20 mg by mouth daily as needed (fluid retention.).) 90 tablet 3   glucose blood test strip Use as instructed qid 100 each 12   ibuprofen (ADVIL) 800 MG tablet Take 1 tablet (800 mg total) by mouth every 8 (eight) hours as needed. 30 tablet 0   irbesartan (AVAPRO) 300 MG tablet TAKE 1 TABLET BY MOUTH DAILY (Patient taking differently: Take 150 mg by mouth daily.) 90 tablet 1   levothyroxine (SYNTHROID) 75 MCG tablet TAKE 1 TABLET BY MOUTH DAILY  BEFORE BREAKFAST 90 tablet 3   magnesium oxide (MAG-OX) 400 MG tablet Take 400 mg by mouth at bedtime.     methocarbamol (ROBAXIN) 500 MG tablet 1/2-1 po qHS prn     OneTouch Delica Lancets 81L MISC 1 each by Does not apply route 4 (four) times daily as needed. 100 each 12   prazosin (MINIPRESS) 2 MG capsule TAKE 1  CAPSULE BY MOUTH AT  BEDTIME 90 capsule 1   propranolol (INDERAL) 20 MG tablet Take 1 tablet (20 mg total) by mouth daily as needed. for anxiety 90 tablet 0   ramelteon (ROZEREM) 8 MG tablet Take 1 tablet (8 mg total) by mouth at bedtime. 30 tablet 1   rOPINIRole (REQUIP) 4 MG tablet Take 1 tablet (4 mg total) by mouth at bedtime. 90 tablet 1   tamsulosin (FLOMAX) 0.4 MG CAPS capsule Take 1 capsule (0.4 mg total) by mouth daily as needed. (Patient taking differently: Take 0.4 mg by mouth daily as needed (Kidney stones).) 30 capsule 2   topiramate (TOPAMAX) 100 MG tablet Take 2 tablets (200 mg total) by mouth at bedtime. 180 tablet 1   traMADol (ULTRAM) 50 MG tablet Take 25-50 mg by mouth 2 (two) times daily as needed (back pain.).      valACYclovir (VALTREX) 1000 MG tablet as needed.     [START ON 08/15/2022] busPIRone (BUSPAR) 7.5 MG tablet Take 1 tablet (7.5 mg total) by mouth 2 (two) times daily. 180 tablet 0   No current facility-administered medications for this visit.     Musculoskeletal: Strength & Muscle Tone: within normal limits Gait & Station: normal Patient leans: N/A  Psychiatric Specialty Exam: Review of Systems  Blood pressure (!) 154/88, pulse 65, height 5' 10"  (1.778 m), weight 164 lb (74.4 kg).Body mass index is 23.53 kg/m.  General Appearance: Fairly Groomed  Eye Contact:  Good  Speech:  Clear and Coherent  Volume:  Normal  Mood:  Anxious and Depressed  Affect:  Appropriate, Congruent, Tearful, and smiles at times  Thought Process:  Coherent  Orientation:  Full (  Time, Place, and Person)  Thought Content: Logical   Suicidal Thoughts:  No  Homicidal Thoughts:  No  Memory:  Immediate;   Good  Judgement:  Good  Insight:  Good  Psychomotor Activity:  Normal  Concentration:  Concentration: Good and Attention Span: Good  Recall:  Good  Fund of Knowledge: Good  Language: Good  Akathisia:  No  Handed:  Right  AIMS (if indicated): not done  Assets:  Communication  Skills Desire for Improvement  ADL's:  Intact  Cognition: WNL  Sleep:  Poor   Screenings: GAD-7    Flowsheet Row Office Visit from 06/28/2022 in [place] Office Visit from 06/22/2022 in [place] and Sports Medicine at [place] Visit from 05/26/2022 in [place] Office Visit from 05/05/2022 in [place] Office Visit from 02/01/2022 in [place] [facility] Medicine at [facility]  Total GAD-7 Score 21 21 21 21 16       PHQ2-9    [place] Office Visit from 06/28/2022 in [place] Visit from 06/22/2022 in [place] and Sports Medicine at [place] Visit from 05/26/2022 in [place] Office Visit from 05/05/2022 in [place] Office Visit from 04/05/2022 in [place]  PHQ-2 Total Score 6 6 6 6 6   PHQ-9 Total Score 24 21 24 21 18       [place] Office Visit from 06/28/2022 in [place] from 05/26/2022 in [place] Office Visit from 05/05/2022 in [place] No Risk No Risk No Risk        Assessment and Plan:  Toni Green is a 61 y.o. year old female with a history of depression, binge eating disorder, hypertension, hypothyroidism, s/p roux-en-y gastric bypass in 2008, who presents for follow up appointment for below.   1. Severe episode of recurrent major depressive disorder, without psychotic features ([place]) Exam is notable for calmer affect, and she has started to engage in daily activity despite ongoing mood symptoms. Psychosocial stressors includes financial strain, complication from surgery/having ostomy bag, mother's medical condition,  loss of her sister in  November, and conflict with her son.  She is planning to pursue [place] after surgery for ostomy reversal is completed.  Will continue Pristiq to target depression.  Will continue BuSpar for anxiety.  Will continue propranolol as needed for anxiety given she reports significant benefit from this medication.   2. Insomnia, unspecified type Unstable.  Will try ramelteon for insomnia.  Discussed potential risk of drowsiness.  Will hold trazodone at this time; she was advised to get back on this medication if ramelteon does not work.   3. Binge eating disorder Improving.  Will continue current dose of topiramate to target binge eating.   # alcohol use Improving.  She denies any alcohol use/craving since surgery.  Will continue motivational interview.    Plan  Continue Pristiq 100 mg daily (generic) - optum Continue Buspar 7.5 mg twice a day (limited benefit from uptitration) Continue propranolol 20 mg daily as needed for anxiety- she declined refill Continue topiramate 200 mg daily Start ramelton 8 mg at night as needed for sleep Hold trazodone (was on 100 mg) Next appointment-  10/30 at 9:30 AM for 30 mins, in person - on tramadol   Past trials of medication- sertraline, lexapro, venlafaxine, bupropion (palpitation),  Abilify, quetiapine (eating sweets), Geodon (increase in appetite). Ambien   This clinician has discussed the side effect associated with medication prescribed during this encounter. Please refer to notes in the previous encounters for more details.      Collaboration of Care: Collaboration of Care: Other N/A  Patient/Guardian was advised Release of Information must be obtained prior to any record release in order to collaborate their care with an outside provider. Patient/Guardian was advised if they have not already done so to contact the registration department to sign all necessary forms in order for [place] to release information regarding their care.   Consent: Patient/Guardian  gives verbal consent for treatment and assignment of benefits for services provided during this visit. Patient/Guardian expressed understanding and agreed to proceed.    Norman Clay, MD 06/28/2022, 8:38 AM

## 2022-06-27 ENCOUNTER — Ambulatory Visit (INDEPENDENT_AMBULATORY_CARE_PROVIDER_SITE_OTHER): Payer: 59 | Admitting: Clinical

## 2022-06-27 DIAGNOSIS — F331 Major depressive disorder, recurrent, moderate: Secondary | ICD-10-CM

## 2022-06-27 NOTE — Progress Notes (Addendum)
[facility] Counselor/Therapist Progress Note  [name], MRN: [id number]    Date: 06/27/22  Time Spent: 8:30  am - 9:15 am : 45 Minutes  Treatment Type: Individual Therapy.  Reported Symptoms: Patient reported crying, difficulty staying asleep, and fluctuation in mood since last session.   Mental Status Exam: Appearance:  Neat     Behavior: Appropriate  Motor: Normal  Speech/Language:  Clear and Coherent  Affect: Tearful  Mood: depressed  Thought process: normal  Thought content:   WNL  Sensory/Perceptual disturbances:   WNL  Orientation: oriented to person and place  Attention: Good  Concentration: Good  Memory: WNL  Fund of knowledge:  Good  Insight:   Good  Judgment:  Good  Impulse Control: Good   Risk Assessment: Danger to Self:  No, patient denied current suicidal ideation and homicidal ideation Self-injurious Behavior: No Danger to Others: No Duty to Warn:no Physical Aggression / Violence:No  Access to Firearms a concern: No  Gang Involvement:No   Subjective:   Patient reported multiple changes since last session. Patient reported her employer terminated her position and she lost her job. Patient reported she is now concerned about the financial impact and paying for COBRA coverage. Patient reported long term disability claim was recently approved and reported feeling a financial burden has been lifted. Patient reported feeling the disability claim being approved will allow her to focus on her health. Patient reported recent memories of her mother and pictures of her sister triggered a recent increase in depressive symptoms. Patient reported she is open to attending a grief/loss support group, but reported she prefers a [place] based group. Patient reported appointment with psychiatrist tomorrow. Patient reported the psychiatrist discussed with patient the option of utilizing medication to help with sleep, and patient reported she  doesn't want additional medications for sleep. Patient reported the following routine when she experiences difficulty sleeping: makes coffee, plays games on her phone, folds laundry, or cooks. Patient reported nightly routine of watching television before going to bed, playing games on her phone, and going to bed around 10:30 or 11pm. Patient reported previously reading, completing crossword puzzles,or coloring before going to bed.   Interventions: Cognitive Behavioral Therapy and psycho education . Clinician conducted face to face therapy session via webex video from clinician's home office. Patient provided verbal consent for telehealth session and participated in session via webex video from patient's home. Clinician provided psycho education related to psychotropic medications, healthy sleep habits, and the impact of sleep on mood. Clinician recommended patient participate in a grief/loss support group, provided psycho education related to grief/loss support group, and discussed patient's response to recommendation. Discussed and identified triggers for recent increase in depressive symptoms. Discussed patient's current sleep habits and identified strategies to develop healthy sleep habits, such as, reading, coloring, and refraining from using television and phone prior to sleep. Clinician requested patient complete sleep journal for homework.   Diagnosis:  Major Depressive Disorder, recurrent, moderate R/O Generalized Anxiety Disorder R/O Social Anxiety Disorder   Treatment Plan: Patient is to utilize Public relations account executive Therapy, Dialectical Behavior Therapy Skills, and coping strategies to help decrease symptoms associated with Anxiety and Major Depressive Disorder.  06/27/22 Patient continues to work towards treatment goals.  Frequency: Weekly  Modality: individual therapy   Long-term goal:   Patient stated, "I would like to be my old confident self again" as evidence by decrease in hands  shaking, knees shaking, voice quivering, and feelings of anxiety from every time  there is a social interaction to 0 times when there is a social interaction   Target Date: 06/17/23  Progress: progressing   Decrease in depressive symptoms as evidence by decrease in crying, loss of interest, lack of energy, difficulty falling asleep/staying asleep from 6 to 7 days per week to 0 to 1 days per week for duration of 6 months to 1 year   Target Date: 06/17/23  Progress: progressing   Short-term goal:  Develop understanding of the relationship between loss of interest in activities to depression and anxiety and the impact on patient's thought patterns and behaviors   Target Date: 12/16/22  Progress: progressing   Develop understanding between patient's thoughts and feelings, such as, automatic negative thoughts, core beliefs, and worry on patient's level of confidence.    Target Date: 12/16/22  Progress: progressing          Katherina Right, LCSW

## 2022-06-28 ENCOUNTER — Ambulatory Visit (INDEPENDENT_AMBULATORY_CARE_PROVIDER_SITE_OTHER): Payer: 59 | Admitting: Psychiatry

## 2022-06-28 ENCOUNTER — Encounter: Payer: Self-pay | Admitting: Psychiatry

## 2022-06-28 VITALS — BP 154/88 | HR 65 | Ht 70.0 in | Wt 164.0 lb

## 2022-06-28 DIAGNOSIS — G4700 Insomnia, unspecified: Secondary | ICD-10-CM | POA: Diagnosis not present

## 2022-06-28 DIAGNOSIS — F332 Major depressive disorder, recurrent severe without psychotic features: Secondary | ICD-10-CM

## 2022-06-28 DIAGNOSIS — F5081 Binge eating disorder: Secondary | ICD-10-CM

## 2022-06-28 MED ORDER — BUSPIRONE HCL 7.5 MG PO TABS: 7.5000 mg | ORAL_TABLET | Freq: Two times a day (BID) | ORAL | 0 refills | Status: DC

## 2022-06-28 MED ORDER — RAMELTEON 8 MG PO TABS: 8.0000 mg | ORAL_TABLET | Freq: Every day | ORAL | 1 refills | Status: DC

## 2022-07-04 ENCOUNTER — Ambulatory Visit: Payer: 59 | Admitting: Clinical

## 2022-07-07 ENCOUNTER — Other Ambulatory Visit: Payer: Self-pay | Admitting: Psychiatry

## 2022-07-07 DIAGNOSIS — F5081 Binge eating disorder: Secondary | ICD-10-CM

## 2022-07-07 DIAGNOSIS — F50819 Binge eating disorder, unspecified: Secondary | ICD-10-CM

## 2022-07-07 DIAGNOSIS — F3341 Major depressive disorder, recurrent, in partial remission: Secondary | ICD-10-CM

## 2022-07-08 ENCOUNTER — Ambulatory Visit (INDEPENDENT_AMBULATORY_CARE_PROVIDER_SITE_OTHER): Payer: 59 | Admitting: Clinical

## 2022-07-08 DIAGNOSIS — F331 Major depressive disorder, recurrent, moderate: Secondary | ICD-10-CM

## 2022-07-08 NOTE — Progress Notes (Signed)
[facility] Counselor/Therapist Progress Note  [name], MRN: [id number],    Date: 07/08/2022  Time Spent: 12:31pm-1:24pm (53 minutes)   Treatment Type: Individual Therapy  Reported Symptoms: patient reported decreased concentration, difficulty staying asleep, fatigue, crying, worry, racing thoughts  Mental Status Exam: Appearance:  Neat     Behavior: Appropriate  Motor: Normal  Speech/Language:  Clear and Coherent  Affect: Tearful  Mood: sad  Thought process: normal  Thought content:   WNL  Sensory/Perceptual disturbances:   WNL  Orientation: oriented to person and place  Attention: Good  Concentration: Good  Memory: WNL  Fund of knowledge:  Good  Insight:   Good  Judgment:  Good  Impulse Control: Good   Risk Assessment: Danger to Self:  No. Patient denied current suicidal ideation Self-injurious Behavior: No Danger to Others: No. Patient denied current homicidal ideation Duty to Warn:no Physical Aggression / Violence:No  Access to Firearms a concern: No  Gang Involvement:No   Subjective: Patient stated, "I flunked" in regards to homework assignment. Patient reported she got the materials out to color but experienced difficulty due to her eyes not being able to focus. Patient reported she only ran one pot of coffee when waking up and experiencing difficulty falling back asleep. Patient reported utilizing her phone when experiencing difficulty falling asleep and stated, "that's a hard habit to break". Patient reported she was tired and sleepy, but felt fidgety and not able to fall back asleep. Patient reported a diagnosis of restless leg syndrome and reported feeling it is affecting her other extremities. Patient identified the following barriers to sleep: racing thoughts, worry about their finances, thoughts about upcoming surgery, and "what if'" thoughts. Patient reported she wants to be able to sleep so that she has the opportunity to dream  about her sister and hear her sister's voice. Patient reported feeling she will cry during the support group and is tired of crying. Patient reported she doesn't like to cry in front of others and doesn't want to make others feel bad if they see her cry. Patient reported she has always put others first and is not sure how to put herself first. Patient reported feeling positive personal qualities survey will be difficult but is open to completing homework assignment.   Interventions: Cognitive Behavioral Therapy and psycho education . Clinician conducted therapy session via webex video from clinician's home office. Patient provided verbal consent for telehealth session and participated in session via webex video from patient's home. Reviewed patient's sleep journal for homework and identified barriers to sleep. Discussed keeping a worry journal to document racing thoughts when experiencing difficulty sleeping. Provided psycho education related to stages of grief and reviewed recommendation for grief/loss support group. Discussed patient's hesitation to participate in support group. Discussed strategies for self care and increasing patient's self confidence. Provided psycho education related to negative self talk. Requested patient complete positive personal qualities survey for homework and discussed utilizing positive self talk when completing homework assignment.  Diagnosis:  Major Depressive Disorder, recurrent, moderate R/O Generalized Anxiety Disorder R/O Social Anxiety Disorder   Treatment Plan: Patient is to utilize Public relations account executive Therapy, Dialectical Behavior Therapy Skills, and coping strategies to help decrease symptoms associated with Anxiety and Major Depressive Disorder.   Frequency: Weekly  Modality: individual therapy   Long-term goal:   Patient stated, "I would like to be my old confident self again" as evidence by decrease in hands shaking, knees shaking, voice quivering, and  feelings of anxiety  from every time there is a social interaction to 0 times when there is a social interaction   Target Date: 06/17/23  Progress: progressing   Decrease in depressive symptoms as evidence by decrease in crying, loss of interest, lack of energy, difficulty falling asleep/staying asleep from 6 to 7 days per week to 0 to 1 days per week for duration of 6 months to 1 year   Target Date: 06/17/23  Progress: progressing   Short-term goal:  Develop understanding of the relationship between loss of interest in activities to depression and anxiety and the impact on patient's thought patterns and behaviors   Target Date: 12/16/22  Progress: progressing   Develop understanding between patient's thoughts and feelings, such as, automatic negative thoughts, core beliefs, and worry on patient's level of confidence.    Target Date: 12/16/22  Progress: progressing    Katherina Right, LCSW

## 2022-07-08 NOTE — Progress Notes (Signed)
                Kadyn Chovan, LCSW 

## 2022-07-11 ENCOUNTER — Telehealth: Payer: Self-pay | Admitting: Psychiatry

## 2022-07-11 ENCOUNTER — Other Ambulatory Visit: Payer: Self-pay | Admitting: Psychiatry

## 2022-07-11 MED ORDER — ESZOPICLONE 1 MG PO TABS: 1.0000 mg | ORAL_TABLET | Freq: Every evening | ORAL | 1 refills | Status: DC | PRN

## 2022-07-11 NOTE — Telephone Encounter (Signed)
Received a notice of denial of ramelteon. Could you contact the patient and ask if she is willing to try Lunesta for insomnia first. Side effects including drowsiness. Thanks.

## 2022-07-11 NOTE — Telephone Encounter (Signed)
Pt.notified

## 2022-07-11 NOTE — Telephone Encounter (Signed)
she will try the lunesta. and if you can send any other medication for 90 day supply since she will be going to [place] in a couple weeks.

## 2022-07-11 NOTE — Telephone Encounter (Signed)
Ordered lunesta. All of her other medication was sent in for 90 days at the last visit. If she has difficulty in getting those, please contact the pharmacy to allow sooner refill.

## 2022-07-18 ENCOUNTER — Ambulatory Visit: Payer: Self-pay | Admitting: Clinical

## 2022-08-01 ENCOUNTER — Other Ambulatory Visit: Payer: Self-pay | Admitting: Psychiatry

## 2022-08-01 DIAGNOSIS — F3341 Major depressive disorder, recurrent, in partial remission: Secondary | ICD-10-CM

## 2022-08-02 ENCOUNTER — Other Ambulatory Visit: Payer: Self-pay | Admitting: Psychiatry

## 2022-08-02 DIAGNOSIS — F3341 Major depressive disorder, recurrent, in partial remission: Secondary | ICD-10-CM

## 2022-08-14 NOTE — Progress Notes (Unsigned)
Virtual Visit via Video Note  I connected with Toni Green on 08/15/22 at  9:30 AM EDT by a video enabled telemedicine application and verified that I am speaking with the correct person using two identifiers.  Location: Patient: home Provider: office Persons participated in the visit- patient, provider    I discussed the limitations of evaluation and management by telemedicine and the availability of in person appointments. The patient expressed understanding and agreed to proceed.    I discussed the assessment and treatment plan with the patient. The patient was provided an opportunity to ask questions and all were answered. The patient agreed with the plan and demonstrated an understanding of the instructions.   The patient was advised to call back or seek an in-person evaluation if the symptoms worsen or if the condition fails to improve as anticipated.  I provided 26 minutes of non-face-to-face time during this encounter.   Norman Clay, MD    [facility] MD/PA/NP OP Progress Note  08/15/2022 10:10 AM Toni Green  MRN:  [id number]  Chief Complaint:  Chief Complaint  Patient presents with   Follow-up   Depression   HPI:  This is a follow-up appointment for depression.  She tearfully describes that there is an anniversary of her sister.  She misses her sister.  She enjoyed going to the beach with her friends.  However, after she comes back, she tends to think about this.  Although she did go to church, she did not been going there as she does not want to be around with others.  She enjoys taking care of 59-year-old boy.  She loves this time.  She also takes a walk.  She takes care of indoor plants.  She has not been able to go back to reading due to difficulty in concentration.  She has not started [place].  Part of the reason was because she is scared about the treatment, although she is still open to it.  She is also waiting for the procedure/surgery to be done for ostomy.  She has  been feeling nervous. She takes propranolol for anxiety. She has been drinking a couple of beers 5 days a week on average.  Although she does not like the taste of it, she drinks as she finds it helpful for her hand tremors.  She verbalized understanding to try cutting down the alcohol use.  She has been sleeping better with the Lunesta; sleeps 4 hours straight.  She feels depressed.  She denies binge eating. She denies SI.  She reports concern of any medication which can potentially cause weight gain.  She is willing to try higher dose of Lunesta at this time.  She states that she asked this appointment to be changed to virtual due to financial strain.  However, she agrees to do in person visit at least every few months moving forward to accurately assess her condition.    Visit Diagnosis:    ICD-10-CM   1. Severe episode of recurrent major depressive disorder, without psychotic features ([place])  F33.2     2. Insomnia, unspecified type  G47.00     3. Binge eating disorder  F50.81       Past Psychiatric History: Please see initial evaluation for full details. I have reviewed the history. No updates at this time.     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   Claustrophobia    Complication of surgery 4/0/9811   Overview:  Dilated pouch found on EGD done 12/2014    Depression    History of kidney stones    Hypertension    Hypoglycemia    Hypothyroidism    Inflammation of sacroiliac joint ([place]) 08/22/2014   Pneumonia 03/2020   PONV (postoperative nausea and vomiting)    NASUEATED/BAD HEADACHES   Seizures ([place]) 1985   x1, after labor/childbirth   Thyroid disease    Wears glasses     Past Surgical History:  Procedure Laterality Date   APPENDECTOMY  2010   BACK  SURGERY  10/2005   lumbar   COLONOSCOPY WITH PROPOFOL N/A 09/23/2016   Procedure: COLONOSCOPY WITH PROPOFOL;  Surgeon: Lucilla Lame, MD;  Location: [place];  Service: Endoscopy;  Laterality: N/A;   COLOSTOMY  10/22/2021   CYSTOSCOPY     x 1   EVALUATION UNDER ANESTHESIA WITH HEMORRHOIDECTOMY N/A 07/06/2020   Procedure: EXAM UNDER ANESTHESIA WITH HEMORRHOIDECTOMY;  Surgeon: Fredirick Maudlin, MD;  Location: [facility];  Service: General;  Laterality: N/A;   GALLBLADDER SURGERY  2009   lap   GASTRIC BYPASS  2008   roux en y   [place] N/A 12/10/2020   Procedure: MUCOSAL ADVANCEMENT FLAP;  Surgeon: Leighton Ruff, MD;  Location: [facility];  Service: General;  Laterality: N/A;  60 MIN   POLYPECTOMY  09/23/2016   Procedure: POLYPECTOMY;  Surgeon: Lucilla Lame, MD;  Location: [facility];  Service: Endoscopy;;   TOTAL VAGINAL HYSTERECTOMY  1999    Family Psychiatric History: Please see initial evaluation for full details. I have reviewed the history. No updates at this time.     Family History:  Family History  Problem Relation Age of Onset   Depression Mother    Hypertension Mother    Depression Father    Hypertension Father    Depression Sister    Breast cancer Sister 102   Hypertension Maternal Grandfather    Depression Maternal Grandmother    Hypertension Maternal Grandmother    Hypertension Paternal Grandfather    Hypertension Paternal Grandmother     Social History:  Social History   Socioeconomic History   Marital status: Married    Spouse name: Not on file   Number of children: Not on file   Years of education: Not on file   Highest education level: Not on file  Occupational History   Not on file  Tobacco Use   Smoking status: Never   Smokeless tobacco: Never  Vaping Use   Vaping Use: Never used  Substance and Sexual Activity   Alcohol use: Yes    Alcohol/week: 0.0 standard drinks of alcohol    Comment: RARE    Drug use: No   Sexual activity: Not Currently  Other Topics Concern   Not on file  Social History Narrative   Not on file   Social Determinants of Health   Financial Resource Strain: Low Risk  (06/22/2022)   Overall Financial Resource Strain (CARDIA)    Difficulty of Paying Living Expenses: Not hard at all  Food Insecurity: No Food Insecurity (06/22/2022)   Hunger Vital Sign    Worried About Running Out of Food in the Last Year: Never true    Ran Out of  Food in the Last Year: Never true  Transportation Needs: No Transportation Needs (06/22/2022)   PRAPARE - Hydrologist (Medical): No    Lack of Transportation (Non-Medical): No  Physical Activity: Not on file  Stress: Not on file  Social Connections: Not on file    Allergies:  Allergies  Allergen Reactions   Losartan Palpitations    Could feel heartbeat (strongly) in neck   Penicillins Rash   Tape Rash    Skin irritation after back surgery white paper tape    Metabolic Disorder Labs: No results found for: "HGBA1C", "MPG" No results found for: "PROLACTIN" Lab Results  Component Value Date   CHOL 225 (H) 02/01/2022   TRIG 67 02/01/2022   HDL 119 02/01/2022   CHOLHDL 1.9 02/01/2022   LDLCALC 94 02/01/2022   LDLCALC 126 (H) 01/07/2021   Lab Results  Component Value Date   TSH 2.330 02/01/2022   TSH 1.360 01/07/2021    Therapeutic Level Labs: No results found for: "LITHIUM" No results found for: "VALPROATE" No results found for: "CBMZ"  Current Medications: Current Outpatient Medications  Medication Sig Dispense Refill   PRISTIQ 100 MG 24 hr tablet Take 1 tablet (100 mg total) by mouth daily. 90 tablet 0   Ascorbic Acid (VITAMIN C) 1000 MG tablet Take 1,000 mg by mouth at bedtime.     Blood Glucose Monitoring Suppl (ONETOUCH VERIO REFLECT) w/Device KIT 1 each by Does not apply route daily at 6 (six) AM. 1 kit 0   busPIRone (BUSPAR) 7.5 MG tablet Take 1 tablet (7.5 mg total) by mouth 2  (two) times daily. 180 tablet 0   cetirizine (ZYRTEC) 10 MG tablet Take 10 mg by mouth as needed.     Cholecalciferol (VITAMIN D-3) 125 MCG (5000 UT) TABS Take 5,000 Units by mouth at bedtime.     Cyanocobalamin (VITAMIN B-12) 2500 MCG SUBL Place 2,500 mcg under the tongue at bedtime.     desvenlafaxine (PRISTIQ) 100 MG 24 hr tablet Take 1 tablet (100 mg total) by mouth every morning. 90 tablet 1   [START ON 08/24/2022] eszopiclone (LUNESTA) 2 MG TABS tablet Take 1 tablet (2 mg total) by mouth at bedtime as needed for sleep. Take immediately before bedtime 30 tablet 1   ferrous sulfate 325 (65 FE) MG tablet Take 325 mg by mouth at bedtime.     fluticasone (FLONASE) 50 MCG/ACT nasal spray Place 2 sprays into both nostrils daily. (Patient taking differently: Place 2 sprays into both nostrils daily as needed (sinus issues/allergies.).) 16 g 6   furosemide (LASIX) 20 MG tablet TAKE 1 TABLET DAILY (Patient taking differently: Take 20 mg by mouth daily as needed (fluid retention.).) 90 tablet 3   glucose blood test strip Use as instructed qid 100 each 12   ibuprofen (ADVIL) 800 MG tablet Take 1 tablet (800 mg total) by mouth every 8 (eight) hours as needed. 30 tablet 0   irbesartan (AVAPRO) 300 MG tablet TAKE 1 TABLET BY MOUTH DAILY (Patient taking differently: Take 150 mg by mouth daily.) 90 tablet 1   levothyroxine (SYNTHROID) 75 MCG tablet TAKE 1 TABLET BY MOUTH DAILY  BEFORE BREAKFAST 90 tablet 3   magnesium oxide (MAG-OX) 400 MG tablet Take 400 mg by mouth at bedtime.     methocarbamol (ROBAXIN) 500 MG tablet 1/2-1 po qHS prn     OneTouch Delica Lancets 63A MISC 1 each by Does not apply route 4 (four) times daily as needed.  100 each 12   prazosin (MINIPRESS) 2 MG capsule TAKE 1 CAPSULE BY MOUTH AT  BEDTIME 90 capsule 1   propranolol (INDERAL) 20 MG tablet Take 1 tablet (20 mg total) by mouth daily as needed. for anxiety 90 tablet 0   rOPINIRole (REQUIP) 4 MG tablet Take 1 tablet (4 mg total) by mouth  at bedtime. 90 tablet 1   tamsulosin (FLOMAX) 0.4 MG CAPS capsule Take 1 capsule (0.4 mg total) by mouth daily as needed. (Patient taking differently: Take 0.4 mg by mouth daily as needed (Kidney stones).) 30 capsule 2   topiramate (TOPAMAX) 100 MG tablet TAKE 2 TABLETS BY MOUTH AT  BEDTIME 180 tablet 0   traMADol (ULTRAM) 50 MG tablet Take 25-50 mg by mouth 2 (two) times daily as needed (back pain.).      valACYclovir (VALTREX) 1000 MG tablet as needed.     No current facility-administered medications for this visit.     Musculoskeletal: Strength & Muscle Tone:  N/A Gait & Station:  N/A Patient leans: N/A  Psychiatric Specialty Exam: Review of Systems  Psychiatric/Behavioral:  Positive for decreased concentration, dysphoric mood and sleep disturbance. Negative for agitation, behavioral problems, confusion, hallucinations, self-injury and suicidal ideas. The patient is nervous/anxious. The patient is not hyperactive.   All other systems reviewed and are negative.   There were no vitals taken for this visit.There is no height or weight on file to calculate BMI.  General Appearance: Fairly Groomed  Eye Contact:  Good  Speech:  Clear and Coherent  Volume:  Normal  Mood:  Depressed  Affect:  Appropriate, Congruent, and Tearful  Thought Process:  Coherent  Orientation:  Full (Time, Place, and Person)  Thought Content: Logical   Suicidal Thoughts:  No  Homicidal Thoughts:  No  Memory:  Immediate;   Good  Judgement:  Good  Insight:  Good  Psychomotor Activity:  Normal  Concentration:  Concentration: Good and Attention Span: Good  Recall:  Good  Fund of Knowledge: Good  Language: Good  Akathisia:  No  Handed:  Right  AIMS (if indicated): not done  Assets:  Communication Skills Desire for Improvement  ADL's:  Intact  Cognition: WNL  Sleep:  Fair   Screenings: GAD-7    Flowsheet Row Office Visit from 06/28/2022 in [place] Office Visit from  06/22/2022 in [place] [facility] at [place] Visit from 05/26/2022 in [place] Office Visit from 05/05/2022 in [place] Office Visit from 02/01/2022 in [place] [facility] at [facility]  Total GAD-7 Score _0 PHQ2-9    [place] Visit from 06/28/2022 in [place] Office Visit from 06/22/2022 in [place] and [facility] Medicine at [place] Visit from 05/26/2022 in [place] Office Visit from 05/05/2022 in [place] Office Visit from 04/05/2022 in [facility]  PHQ-2 Total Score _1 PHQ-9 Total Score _2 [facility] Office Visit from 06/28/2022 in [place] Office Visit from 05/26/2022 in [place] Office Visit from 05/05/2022 in [place] No Risk No Risk No Risk        Assessment and Plan:  Toni Green is a 61 y.o. year  old female with a history of  depression, binge eating disorder, hypertension, hypothyroidism, s/p roux-en-y gastric bypass in 2008, who presents for follow up appointment for below.   1. Severe episode of recurrent major depressive disorder, without psychotic features ([place]) Exam is notable for tearfulness, and she continues to experience depressive symptoms and then anxiety in the context of anniversary of loss of her sister. Psychosocial stressors includes financial strain, complication from surgery/having ostomy bag, mother's medical condition,  loss of her sister in November, and conflict with her son.  She has not started [place] given she has an upcoming procedure for colostomy, although she is open to this (and insurance approved this)  despite some ambivalence.  Provided psycho education about [place]; it will be beneficial for her based on past trial of psychotropics with limited benefit/adverse reaction.  Noted that she has significant concern of potential weight gain from pharmacological treatment.  Will continue current medication regimen with the hope that intervention for sleep improves her mood; will continue Pristiq to target depression.  Will continue BuSpar for anxiety.  Will continue propranolol as needed for anxiety given she reports significant benefit from this medication.  Coached behavioral activation.   2. Insomnia, unspecified type Slightly improving after starting Lunesta.  We uptitrate the dose to optimize treatment for insomnia.  Discussed potential risk of drowsiness.   3. Binge eating disorder Unchanged.  Will current dose of topiramate to target binge eating.   # Alcohol use She reports ongoing alcohol use for anxiety, despite she does not like the taste of it.  She denies craving for alcohol.  Will continue motivational interview.    Plan  Continue Pristiq 100 mg daily (generic) - [facility], brand Continue Buspar 7.5 mg twice a day (limited benefit from uptitration) Continue propranolol 20 mg daily as needed for anxiety- she declined refill Continue topiramate 200 mg daily Increase Lunesta 2 mg at night - [facility] Next appointment-  12/13 at 9:30, video. She agrees to do in person visit at least every few months (she reports concern due to financial strain) - on tramadol   Past trials of medication- sertraline, lexapro, venlafaxine, bupropion (palpitation), Abilify, quetiapine (eating sweets), Geodon (increase in appetite). Ambien      Collaboration of Care: Collaboration of Care: Other reviewed notes in Epic  Patient/Guardian was advised Release of Information must be obtained prior to any record release in order to collaborate their care with an outside provider. Patient/Guardian was advised if they  have not already done so to contact the registration department to sign all necessary forms in order for [place] to release information regarding their care.   Consent: Patient/Guardian gives verbal consent for treatment and assignment of benefits for services provided during this visit. Patient/Guardian expressed understanding and agreed to proceed.    Norman Clay, MD 08/15/2022, 10:10 AM

## 2022-08-15 ENCOUNTER — Encounter: Payer: Self-pay | Admitting: Psychiatry

## 2022-08-15 ENCOUNTER — Ambulatory Visit: Payer: Self-pay | Admitting: Clinical

## 2022-08-15 ENCOUNTER — Telehealth (INDEPENDENT_AMBULATORY_CARE_PROVIDER_SITE_OTHER): Payer: 59 | Admitting: Psychiatry

## 2022-08-15 DIAGNOSIS — F332 Major depressive disorder, recurrent severe without psychotic features: Secondary | ICD-10-CM

## 2022-08-15 DIAGNOSIS — F5081 Binge eating disorder: Secondary | ICD-10-CM

## 2022-08-15 DIAGNOSIS — G4700 Insomnia, unspecified: Secondary | ICD-10-CM

## 2022-08-15 MED ORDER — PRISTIQ 100 MG PO TB24: 100.0000 mg | ORAL_TABLET | Freq: Every day | ORAL | 0 refills | Status: DC

## 2022-08-15 MED ORDER — ESZOPICLONE 2 MG PO TABS: 2.0000 mg | ORAL_TABLET | Freq: Every evening | ORAL | 1 refills | Status: DC | PRN

## 2022-08-15 NOTE — Patient Instructions (Addendum)
Continue Pristiq 100 mg daily  Continue Buspar 7.5 mg twice a day  Continue propranolol 20 mg daily as needed for anxiety Continue topiramate 200 mg daily Increase Lunesta 2 mg at night  Next appointment-  12/13 at 9:30

## 2022-08-16 ENCOUNTER — Ambulatory Visit: Payer: Self-pay | Admitting: Clinical

## 2022-08-22 ENCOUNTER — Ambulatory Visit (INDEPENDENT_AMBULATORY_CARE_PROVIDER_SITE_OTHER): Payer: 59 | Admitting: Clinical

## 2022-08-22 DIAGNOSIS — F331 Major depressive disorder, recurrent, moderate: Secondary | ICD-10-CM | POA: Diagnosis not present
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
## 2022-08-22 NOTE — Progress Notes (Signed)
                Jasmeet Gehl, LCSW 

## 2022-08-25 ENCOUNTER — Telehealth: Payer: Self-pay

## 2022-08-25 NOTE — Telephone Encounter (Signed)
pt needs name brand rx for pristiq sent into pharmacy for a 90 day supply. generic form is $100 + and name brand they can uses a discount card.

## 2022-08-25 NOTE — Telephone Encounter (Signed)
PRISTIQ 100 MG 24 hr tablet 90 tablet 0 08/15/2022 11/13/2022   Sig - Route: Take 1 tablet (100 mg total) by mouth daily. - Oral   Sent to pharmacy as: PRISTIQ 100 MG 24 hr tablet   Notes to Pharmacy: Brand only   E-Prescribing Status: Receipt confirmed by pharmacy (08/15/2022 10:05 AM EDT)     Pristiq BRAND ONLY - was sent to pharmacy per Dr.Hisada per chart review.

## 2022-09-01 ENCOUNTER — Other Ambulatory Visit: Payer: Self-pay | Admitting: Psychiatry

## 2022-09-01 DIAGNOSIS — F5081 Binge eating disorder: Secondary | ICD-10-CM

## 2022-09-01 NOTE — Telephone Encounter (Signed)
Defer to Dr.Hisada

## 2022-09-05 ENCOUNTER — Telehealth: Payer: Self-pay

## 2022-09-05 ENCOUNTER — Ambulatory Visit (INDEPENDENT_AMBULATORY_CARE_PROVIDER_SITE_OTHER): Payer: 59 | Admitting: Clinical

## 2022-09-05 DIAGNOSIS — F331 Major depressive disorder, recurrent, moderate: Secondary | ICD-10-CM

## 2022-09-05 NOTE — Progress Notes (Signed)
[facility] Counselor/Therapist Progress Note  [name], MRN: [id number],    Date: 09/05/2022  Time Spent: 9:31am - 10:27am : 56 minutes   Treatment Type: Individual Therapy  Reported Symptoms: Patient reported significant increase in sleep, feels she is "in a fog", decreased concentration, lack of energy, fatigue, and a delayed response.   Mental Status Exam: Appearance:  Well Groomed     Behavior: Appropriate  Motor: Psychomotor Retardation per patient's report  Speech/Language:  Clear and Coherent  Affect: Tearful  Mood: anxious  Thought process: normal  Thought content:   WNL  Sensory/Perceptual disturbances:   WNL  Orientation: oriented to person, place, and situation  Attention: Good  Concentration: Good  Memory: WNL  Fund of knowledge:  Good  Insight:   Good  Judgment:  Good  Impulse Control: Good   Risk Assessment: Danger to Self:  No Patient denied current suicidal ideation Self-injurious Behavior: No Danger to Others: No Patient denied current homicidal ideation Duty to Warn:no Physical Aggression / Violence:No  Access to Firearms a concern: No  Gang Involvement:No   Subjective: Patient reported during her recent appointment with the psychiatrist the dosage of lunesta was increased and patient stated, "Im like a zombie". Patient reported significant increase in sleep, feels she is "in a fog", decreased concentration, lack of energy, fatigue, and a delayed response. Patient stated, "I feel like Im in slow motion during the day" and stated, "I feel like I am doing everything in slow motion".  Patient reported feeling anxious and "numb". Patient denied current suicidal ideation but stated, "sometimes I just don't care". Patient stated, "I would never hurt myself" and stated, "I want to be happy again". Patient reported she doesn't respond when others reach out to patient and stated, "its like I've become a recluse". Patient stated being  around others "is a struggle for me" and reported an increase over the past year.  Patient reported her faith has always been an important aspect of her life and reported after her sister's passing she questioned her faith and was angry. Patient stated, "its kind of overwhelming when you look at whole big picture". Patient reported plans to apply opposite action, setting smaller goals, and utilizing one step at a time when going to her brother in [place] home for the upcoming holiday. Patient reported she plans to contact her psychiatrist today to discuss medication concerns.   Interventions: Cognitive Behavioral Therapy and Dialectical Behavioral Therapy. Clinician conducted session via WebEx video from clinician's home office. Patient provided verbal consent to proceed with telehealth session and participated in session from patient's home. Discussed recent changes in patient's medications, side effects, and impact on patient's mood. Provided psycho education related to psychotropic medications. Clinician recommended patient contact her psychiatrist to discuss recent changes in symptoms and medication side effects. Explored changes in patient's coping mechanisms after her sister's death. Discussed changes in patient's social interactions and provided psycho education related to depressive symptoms. Provided psycho education related to stages of grief, use of positive self talk, and DBT skill of opposite action.  Discussed establishing smaller goals as it relates to social interactions/leaving her home and approaching situations one step at a time.  Diagnosis:  Major Depressive Disorder, recurrent, moderate R/O Generalized Anxiety Disorder R/O Social Anxiety Disorder   Treatment Plan: Patient is to utilize Public relations account executive Therapy, Dialectical Behavior Therapy Skills, and coping strategies to help decrease symptoms associated with Anxiety and Major Depressive Disorder.   Frequency: Weekly  Modality:  individual therapy    Long-term goal:   Patient stated, "I would like to be my old confident self again" as evidence by decrease in hands shaking, knees shaking, voice quivering, and feelings of anxiety from every time there is a social interaction to 0 times when there is a social interaction   Target Date: 06/17/23  Progress: progressing    Decrease in depressive symptoms as evidence by decrease in crying, loss of interest, lack of energy, difficulty falling asleep/staying asleep from 6 to 7 days per week to 0 to 1 days per week for duration of 6 months to 1 year   Target Date: 06/17/23  Progress: progressing    Short-term goal:  Develop understanding of the relationship between loss of interest in activities to depression and anxiety and the impact on patient's thought patterns and behaviors   Target Date: 12/16/22  Progress: progressing    Develop understanding between patient's thoughts and feelings, such as, automatic negative thoughts, core beliefs, and worry on patient's level of confidence.    Target Date: 12/16/22  Progress: progressing    Katherina Right, LCSW

## 2022-09-05 NOTE — Telephone Encounter (Signed)
pt left message that the lunesta increased in oct 2nd she states it way to much, she has brain fog,

## 2022-09-05 NOTE — Progress Notes (Signed)
                Toni Russey, LCSW 

## 2022-09-05 NOTE — Telephone Encounter (Signed)
Advise her to taper down Lunesta to 1 mg at night as needed until the next visit. Let me know if she needs a refill of the medication.

## 2022-09-07 NOTE — Telephone Encounter (Signed)
pt states she not takin it at all she tried the '1mg'$  and she still did not feel out of sorts.

## 2022-09-07 NOTE — Telephone Encounter (Signed)
Dr. Hisada's pt 

## 2022-09-11 NOTE — Telephone Encounter (Signed)
Please advise her to hold Lunesta at this time.

## 2022-09-13 ENCOUNTER — Telehealth: Payer: Self-pay

## 2022-09-13 NOTE — Telephone Encounter (Signed)
This is to note that the patient has tried venlafaxine XR and failed/limited response.

## 2022-09-13 NOTE — Telephone Encounter (Signed)
[facility] called states that patient has to have tried and failed the effexorXR before they can approved the pristiq name brand. and it has to be documented in chart.

## 2022-09-14 NOTE — Telephone Encounter (Signed)
It just states venlafaxine It does not state the venlafaxine xr

## 2022-09-20 ENCOUNTER — Other Ambulatory Visit: Payer: Self-pay | Admitting: Psychiatry

## 2022-09-20 DIAGNOSIS — F331 Major depressive disorder, recurrent, moderate: Secondary | ICD-10-CM

## 2022-09-26 ENCOUNTER — Ambulatory Visit: Payer: 59 | Admitting: Clinical

## 2022-09-26 NOTE — Progress Notes (Unsigned)
Virtual Visit via Video Note  I connected with Toni Green on 09/28/22 at  9:30 AM EST by a video enabled telemedicine application and verified that I am speaking with the correct person using two identifiers.  Location: Patient: home Provider: office Persons participated in the visit- patient, provider    I discussed the limitations of evaluation and management by telemedicine and the availability of in person appointments. The patient expressed understanding and agreed to proceed.   I discussed the assessment and treatment plan with the patient. The patient was provided an opportunity to ask questions and all were answered. The patient agreed with the plan and demonstrated an understanding of the instructions.   The patient was advised to call back or seek an in-person evaluation if the symptoms worsen or if the condition fails to improve as anticipated.  I provided 16 minutes of non-face-to-face time during this encounter.   Norman Clay, MD    [facility] MD/PA/NP OP Progress Note  09/28/2022 12:12 PM Toni Green  MRN:  [id number]  Chief Complaint:  Chief Complaint  Patient presents with   Follow-up   HPI:  This is a follow-up appointment for depression and anxiety.  She apologized for being late for the appointment.  She states that she has been doing the same.  She was found to that her colostomy is healed 100%.  She will have a colostomy reversal.  She feels very good about this news.  She has not proceeded with [place] as she wanted to have this surgery done first.  She states that her anxiety is through the roof.  Although she did not want to put the Christmas tree, she was able to do it as she got ornament from her sister.  She feels good about this.  She has been seeing a therapist.  She has been working on sleep hygiene, and the relationship with her mother ("oil and water".)  She has insomnia.  She discontinued Lunesta as she felt like a zombie from higher dose of the  medication.  She also reports concern of dependence on the medication, stating that it took a while when she tried to come off from Ambien.  She reports frustration of not being able to get brand Pristiq.  Generic is not working well enough, and it costs more.  She reports decrease in appetite, and she tends to eat at night.  She denies binge eating.  She denies SI.  She denies alcohol use or drug use.  She agrees to stay on the current medication regimen at this time with the plan to proceed with [place] after her surgery.    Wt Readings from Last 3 Encounters:  06/28/22 164 lb (74.4 kg)  06/22/22 162 lb (73.5 kg)  05/26/22 164 lb 3.2 oz (74.5 kg)     Visit Diagnosis:    ICD-10-CM   1. Major depressive disorder, recurrent episode, moderate (HCC)  F33.1     2. Binge eating disorder  F50.81 topiramate (TOPAMAX) 100 MG tablet    3. Insomnia, unspecified type  G47.00       Past Psychiatric History: Please see initial evaluation for full details. I have reviewed the history. No updates at this time.     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   Claustrophobia    Complication of surgery 03/24/3418   Overview:  Dilated pouch found on EGD done 12/2014    Depression    History of kidney stones    Hypertension    Hypoglycemia    Hypothyroidism    Inflammation of sacroiliac joint ([place]) 08/22/2014   Pneumonia 03/2020   PONV (postoperative nausea and vomiting)    NASUEATED/BAD HEADACHES   Seizures ([place]) 1985   x1, after labor/childbirth   Thyroid disease    Wears glasses     Past Surgical History:  Procedure Laterality Date   APPENDECTOMY  2010   BACK SURGERY  10/2005   lumbar   COLONOSCOPY WITH PROPOFOL N/A 09/23/2016   Procedure: COLONOSCOPY WITH PROPOFOL;   Surgeon: Lucilla Lame, MD;  Location: [facility];  Service: Endoscopy;  Laterality: N/A;   COLOSTOMY  10/22/2021   CYSTOSCOPY     x 1   EVALUATION UNDER ANESTHESIA WITH HEMORRHOIDECTOMY N/A 07/06/2020   Procedure: EXAM UNDER ANESTHESIA WITH HEMORRHOIDECTOMY;  Surgeon: Fredirick Maudlin, MD;  Location: [facility];  Service: General;  Laterality: N/A;   GALLBLADDER SURGERY  2009   lap   GASTRIC BYPASS  2008   roux en y   [place] N/A 12/10/2020   Procedure: MUCOSAL ADVANCEMENT FLAP;  Surgeon: Leighton Ruff, MD;  Location: [facility];  Service: General;  Laterality: N/A;  60 MIN   POLYPECTOMY  09/23/2016   Procedure: POLYPECTOMY;  Surgeon: Lucilla Lame, MD;  Location: [facility];  Service: Endoscopy;;   TOTAL VAGINAL HYSTERECTOMY  1999    Family Psychiatric History: Please see initial evaluation for full details. I have reviewed the history. No updates at this time.     Family History:  Family History  Problem Relation Age of Onset   Depression Mother    Hypertension Mother    Depression Father    Hypertension Father    Depression Sister    Breast cancer Sister 23   Hypertension Maternal Grandfather    Depression Maternal Grandmother    Hypertension Maternal Grandmother    Hypertension Paternal Grandfather    Hypertension Paternal Grandmother     Social History:  Social History   Socioeconomic History   Marital status: Married    Spouse name: Not on file   Number of children: Not on file   Years of education: Not on file   Highest education level: Not on file  Occupational History   Not on file  Tobacco Use   Smoking status: Never   Smokeless tobacco: Never  Vaping Use   Vaping Use: Never used  Substance and Sexual Activity   Alcohol use: Yes    Alcohol/week: 0.0 standard drinks of alcohol    Comment: RARE   Drug use: No   Sexual activity: Not Currently  Other Topics Concern   Not on file  Social History Narrative    Not on file   Social Determinants of Health   Financial Resource Strain: Low Risk  (06/22/2022)   Overall Financial Resource Strain (CARDIA)    Difficulty of Paying Living Expenses: Not hard at all  Food Insecurity: No Food Insecurity (06/22/2022)   Hunger Vital Sign    Worried About Running Out of Food in the Last Year: Never true    Ran Out of Food in the Last Year: Never true  Transportation Needs: No Transportation Needs (06/22/2022)  PRAPARE - Hydrologist (Medical): No    Lack of Transportation (Non-Medical): No  Physical Activity: Not on file  Stress: Not on file  Social Connections: Not on file    Allergies:  Allergies  Allergen Reactions   Losartan Palpitations    Could feel heartbeat (strongly) in neck   Penicillins Rash   Tape Rash    Skin irritation after back surgery white paper tape    Metabolic Disorder Labs: No results found for: "HGBA1C", "MPG" No results found for: "PROLACTIN" Lab Results  Component Value Date   CHOL 225 (H) 02/01/2022   TRIG 67 02/01/2022   HDL 119 02/01/2022   CHOLHDL 1.9 02/01/2022   LDLCALC 94 02/01/2022   LDLCALC 126 (H) 01/07/2021   Lab Results  Component Value Date   TSH 2.330 02/01/2022   TSH 1.360 01/07/2021    Therapeutic Level Labs: No results found for: "LITHIUM" No results found for: "VALPROATE" No results found for: "CBMZ"  Current Medications: Current Outpatient Medications  Medication Sig Dispense Refill   Ascorbic Acid (VITAMIN C) 1000 MG tablet Take 1,000 mg by mouth at bedtime.     Blood Glucose Monitoring Suppl (ONETOUCH VERIO REFLECT) w/Device KIT 1 each by Does not apply route daily at 6 (six) AM. 1 kit 0   busPIRone (BUSPAR) 7.5 MG tablet Take 1 tablet (7.5 mg total) by mouth 2 (two) times daily. 180 tablet 0   cetirizine (ZYRTEC) 10 MG tablet Take 10 mg by mouth as needed.     Cholecalciferol (VITAMIN D-3) 125 MCG (5000 UT) TABS Take 5,000 Units by mouth at bedtime.      Cyanocobalamin (VITAMIN B-12) 2500 MCG SUBL Place 2,500 mcg under the tongue at bedtime.     desvenlafaxine (PRISTIQ) 100 MG 24 hr tablet Take 1 tablet (100 mg total) by mouth every morning. 90 tablet 1   ferrous sulfate 325 (65 FE) MG tablet Take 325 mg by mouth at bedtime.     fluticasone (FLONASE) 50 MCG/ACT nasal spray Place 2 sprays into both nostrils daily. (Patient taking differently: Place 2 sprays into both nostrils daily as needed (sinus issues/allergies.).) 16 g 6   furosemide (LASIX) 20 MG tablet TAKE 1 TABLET DAILY (Patient taking differently: Take 20 mg by mouth daily as needed (fluid retention.).) 90 tablet 3   glucose blood test strip Use as instructed qid 100 each 12   ibuprofen (ADVIL) 800 MG tablet Take 1 tablet (800 mg total) by mouth every 8 (eight) hours as needed. 30 tablet 0   irbesartan (AVAPRO) 300 MG tablet TAKE 1 TABLET BY MOUTH DAILY (Patient taking differently: Take 150 mg by mouth daily.) 90 tablet 1   levothyroxine (SYNTHROID) 75 MCG tablet TAKE 1 TABLET BY MOUTH DAILY  BEFORE BREAKFAST 90 tablet 3   magnesium oxide (MAG-OX) 400 MG tablet Take 400 mg by mouth at bedtime.     methocarbamol (ROBAXIN) 500 MG tablet 1/2-1 po qHS prn     OneTouch Delica Lancets 09W MISC 1 each by Does not apply route 4 (four) times daily as needed. 100 each 12   prazosin (MINIPRESS) 2 MG capsule TAKE 1 CAPSULE BY MOUTH AT  BEDTIME 90 capsule 1   PRISTIQ 100 MG 24 hr tablet Take 1 tablet (100 mg total) by mouth daily. 90 tablet 0   propranolol (INDERAL) 20 MG tablet Take 1 tablet (20 mg total) by mouth daily as needed. for anxiety 90 tablet 0   rOPINIRole (REQUIP)  4 MG tablet Take 1 tablet (4 mg total) by mouth at bedtime. 90 tablet 1   tamsulosin (FLOMAX) 0.4 MG CAPS capsule Take 1 capsule (0.4 mg total) by mouth daily as needed. (Patient taking differently: Take 0.4 mg by mouth daily as needed (Kidney stones).) 30 capsule 2   [START ON 10/07/2022] topiramate (TOPAMAX) 100 MG tablet Take 2  tablets (200 mg total) by mouth at bedtime. 180 tablet 1   traMADol (ULTRAM) 50 MG tablet Take 25-50 mg by mouth 2 (two) times daily as needed (back pain.).      valACYclovir (VALTREX) 1000 MG tablet as needed.     No current facility-administered medications for this visit.     Musculoskeletal: Strength & Muscle Tone:  N?A Gait & Station:  N/A Patient leans: N/A  Psychiatric Specialty Exam: Review of Systems  Psychiatric/Behavioral:  Positive for decreased concentration, dysphoric mood and sleep disturbance. Negative for agitation, behavioral problems, confusion, hallucinations, self-injury and suicidal ideas. The patient is nervous/anxious. The patient is not hyperactive.   All other systems reviewed and are negative.   There were no vitals taken for this visit.There is no height or weight on file to calculate BMI.  General Appearance: Fairly Groomed  Eye Contact:  Good  Speech:  Clear and Coherent  Volume:  Normal  Mood:  Anxious  Affect:  Appropriate, Congruent, and Tearful  Thought Process:  Coherent  Orientation:  Full (Time, Place, and Person)  Thought Content: Logical   Suicidal Thoughts:  No  Homicidal Thoughts:  No  Memory:  Immediate;   Good  Judgement:  Good  Insight:  Good  Psychomotor Activity:  Normal  Concentration:  Concentration: Good and Attention Span: Good  Recall:  Good  Fund of Knowledge: Good  Language: Good  Akathisia:  No  Handed:  Right  AIMS (if indicated): not done  Assets:  Communication Skills Desire for Improvement  ADL's:  Intact  Cognition: WNL  Sleep:  Poor   Screenings: GAD-7    Flowsheet Row Office Visit from 06/28/2022 in [facility] Office Visit from 06/22/2022 in [facility] at [place] Visit from 05/26/2022 in [place] Office Visit from 05/05/2022 in [place] Office Visit from 02/01/2022 in  [place] and Sports Medicine at [facility]  Total GAD-7 Score _0 PHQ2-9    [place] Office Visit from 06/28/2022 in [facility] Office Visit from 06/22/2022 in [place] [facility] and Sports Medicine at [place] Visit from 05/26/2022 in [facility] Office Visit from 05/05/2022 in [facility] Office Visit from 04/05/2022 in [facility]  PHQ-2 Total Score _1 PHQ-9 Total Score _2 [place] Office Visit from 06/28/2022 in [facility] Office Visit from 05/26/2022 in [facility] [place] Office Visit from 05/05/2022 in [place] No Risk No Risk No Risk        Assessment and Plan:  Toni Green is a 61 y.o. year old female with a history of depression, binge eating disorder, hypertension, hypothyroidism, s/p roux-en-y gastric bypass in 2008, who presents for follow up appointment for below.    2. Major depressive disorder, recurrent episode, moderate (HCC) Exam is notable for less tearfulness,  although she continues to report depressive symptoms and anxiety. Psychosocial stressors includes financial strain, complication from surgery/having ostomy bag, mother's medical condition,  loss of her sister in November, and conflict with her son.  She has not started [place] given she has an upcoming procedure for colostomy, although she is open to this (and insurance approved this).  Will continue current medication regimen at this time given she prefers not to start any medication which can cause potential weight gain.  Will continue Pristiq to target depression.  Will continue BuSpar for anxiety.  Will continue propranolol as needed for anxiety given she reports significant benefit from this medication.   Coached behavioral activation.   3. Insomnia, unspecified type Worsening.  She had adverse reaction of drowsiness from higher dose of Lunesta.  She reports having difficulty in coming off from Ambien.  She agrees to hold hypnotics at this time.    1. Binge eating disorder Unchanged.  Will continue topiramate for binge eating.   # Alcohol use She previously reported ongoing alcohol use for anxiety, despite she does not like the taste of it.  She denies craving for alcohol.  Will continue motivational interview.    Plan  Continue Pristiq 100 mg daily  - [facility].  She reports limited benefit from genetic desvenlafaxine. She reports great benefit from Pristiq- thus, brand name is preferred. Continue Buspar 7.5 mg twice a day (limited benefit from uptitration) Continue propranolol 20 mg daily as needed for anxiety- she declined refill Continue topiramate 200 mg daily Hold Lunesta (drowsiness from 2 mg) Next appointment-  2/7 at 9:30, video  , video. She agrees to do in person visit at least every few months (she reports concern due to financial strain) - on tramadol - EKG QTc 394 msec on 11/2020   Past trials of medication- sertraline, lexapro, venlafaxine, bupropion (palpitation), Abilify, quetiapine (eating sweets), Geodon (increase in appetite). Ambien, Lunesta            Collaboration of Care: Collaboration of Care: Other reviewed notes in Epic  Patient/Guardian was advised Release of Information must be obtained prior to any record release in order to collaborate their care with an outside provider. Patient/Guardian was advised if they have not already done so to contact the registration department to sign all necessary forms in order for [place] to release information regarding their care.   Consent: Patient/Guardian gives verbal consent for treatment and assignment of benefits for services provided during this visit. Patient/Guardian expressed understanding and agreed to proceed.     Norman Clay, MD 09/28/2022, 12:12 PM

## 2022-09-28 ENCOUNTER — Other Ambulatory Visit: Payer: Self-pay | Admitting: Psychiatry

## 2022-09-28 ENCOUNTER — Encounter: Payer: Self-pay | Admitting: Psychiatry

## 2022-09-28 ENCOUNTER — Telehealth (INDEPENDENT_AMBULATORY_CARE_PROVIDER_SITE_OTHER): Payer: 59 | Admitting: Psychiatry

## 2022-09-28 DIAGNOSIS — F5081 Binge eating disorder: Secondary | ICD-10-CM | POA: Diagnosis not present

## 2022-09-28 DIAGNOSIS — G4700 Insomnia, unspecified: Secondary | ICD-10-CM | POA: Diagnosis not present

## 2022-09-28 DIAGNOSIS — F331 Major depressive disorder, recurrent, moderate: Secondary | ICD-10-CM

## 2022-09-28 MED ORDER — TOPIRAMATE 100 MG PO TABS: 200.0000 mg | ORAL_TABLET | Freq: Every day | ORAL | 1 refills | Status: DC

## 2022-09-28 NOTE — Patient Instructions (Signed)
Continue Pristiq 100 mg daily   Continue Buspar 7.5 mg twice a day Continue propranolol 20 mg daily as needed for anxiety- she declined refill Continue topiramate 200 mg daily Hold Lunesta  Next appointment-  2/7 at 9:30

## 2022-10-03 ENCOUNTER — Ambulatory Visit: Payer: No Typology Code available for payment source | Admitting: Internal Medicine

## 2022-10-03 ENCOUNTER — Telehealth: Payer: 59 | Admitting: Internal Medicine

## 2022-10-03 ENCOUNTER — Encounter: Payer: Self-pay | Admitting: Internal Medicine

## 2022-10-03 VITALS — BP 124/76 | HR 82 | Temp 98.2°F | Ht 70.0 in | Wt 162.0 lb

## 2022-10-03 DIAGNOSIS — R509 Fever, unspecified: Secondary | ICD-10-CM | POA: Diagnosis not present

## 2022-10-03 DIAGNOSIS — J0100 Acute maxillary sinusitis, unspecified: Secondary | ICD-10-CM | POA: Diagnosis not present

## 2022-10-03 DIAGNOSIS — R059 Cough, unspecified: Secondary | ICD-10-CM | POA: Diagnosis not present

## 2022-10-03 LAB — POC COVID19 BINAXNOW: SARS Coronavirus 2 Ag: NEGATIVE

## 2022-10-03 MED ORDER — AZITHROMYCIN 250 MG PO TABS: ORAL_TABLET | ORAL | 0 refills | Status: AC

## 2022-10-03 MED ORDER — FLUTICASONE PROPIONATE 50 MCG/ACT NA SUSP: 2.0000 | Freq: Every day | NASAL | 6 refills | Status: DC

## 2022-10-03 MED ORDER — PROMETHAZINE-DM 6.25-15 MG/5ML PO SYRP: 5.0000 mL | ORAL_SOLUTION | Freq: Four times a day (QID) | ORAL | 0 refills | Status: AC | PRN

## 2022-10-03 NOTE — Progress Notes (Signed)
Date:  10/03/2022   Name:  Toni Green   DOB:  08/05/1961   MRN:  [id number]   Chief Complaint: Cough  Cough This is a new problem. Episode onset: X4 days. The problem has been gradually worsening. The problem occurs every few minutes. The cough is Productive of sputum (green mucous). Associated symptoms include ear congestion, a fever (low grade), nasal congestion, postnasal drip, rhinorrhea and a sore throat. Pertinent negatives include no chest pain or chills. Nothing aggravates the symptoms. She has tried OTC cough suppressant for the symptoms. The treatment provided mild relief.    Lab Results  Component Value Date   NA 139 12/28/2021   K 4.1 12/28/2021   CO2 26 (A) 12/28/2021   GLUCOSE 83 01/07/2021   BUN 14 12/28/2021   CREATININE 0.8 12/28/2021   CALCIUM 9.8 01/07/2021   EGFR 90 12/28/2021   GFRNONAA 77 01/06/2020   Lab Results  Component Value Date   CHOL 225 (H) 02/01/2022   HDL 119 02/01/2022   LDLCALC 94 02/01/2022   TRIG 67 02/01/2022   CHOLHDL 1.9 02/01/2022   Lab Results  Component Value Date   TSH 2.330 02/01/2022   No results found for: "HGBA1C" Lab Results  Component Value Date   WBC 4.5 06/22/2022   HGB 13.7 06/22/2022   HCT 41.4 06/22/2022   MCV 95 06/22/2022   PLT 211 06/22/2022   Lab Results  Component Value Date   ALT 15 12/28/2021   AST 17 12/28/2021   ALKPHOS 119 12/28/2021   BILITOT 0.2 01/07/2021   Lab Results  Component Value Date   VD25OH 39.3 01/07/2021     Review of Systems  Constitutional:  Positive for fever (low grade). Negative for chills and fatigue.  HENT:  Positive for postnasal drip, rhinorrhea and sore throat.   Respiratory:  Positive for cough.   Cardiovascular:  Negative for chest pain and palpitations.  Psychiatric/Behavioral:  Negative for dysphoric mood and sleep disturbance. The patient is not nervous/anxious.     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   MDD (major depressive disorder), recurrent, in partial remission ([place]) 01/06/2021   S/P total hysterectomy 11/25/2020   Hemorrhoids    Anxiety state 10/14/2019   Binge eating disorder 10/14/2019   Restless leg syndrome 11/30/2018   Bright red rectal bleeding 11/30/2018   S/P gastric bypass 08/07/2017   Special screening for malignant neoplasms, colon    Benign neoplasm of ascending colon    Polyp of sigmoid colon    Menopause syndrome 07/26/2016   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   Essential (primary) hypertension 07/20/2015   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 disorder 07/20/2015   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   DDD (degenerative disc disease), cervical 03/17/2014   Calculus of kidney 02/13/2013    Allergies  Allergen Reactions   Losartan Palpitations    Could feel heartbeat (strongly) in neck   Penicillins Rash   Tape Rash    Skin irritation after back surgery white paper tape    Past Surgical History:  Procedure Laterality Date   APPENDECTOMY  2010   BACK SURGERY  10/2005   lumbar   COLONOSCOPY WITH PROPOFOL N/A 09/23/2016   Procedure: COLONOSCOPY WITH PROPOFOL;  Surgeon: Lucilla Lame, MD;  Location: [facility];  Service: Endoscopy;  Laterality: N/A;  COLOSTOMY  10/22/2021   CYSTOSCOPY     x 1   EVALUATION UNDER ANESTHESIA WITH HEMORRHOIDECTOMY N/A 07/06/2020   Procedure: EXAM UNDER ANESTHESIA WITH HEMORRHOIDECTOMY;  Surgeon: Fredirick Maudlin, MD;  Location: [facility];  Service: General;  Laterality: N/A;   GALLBLADDER SURGERY  2009   lap   GASTRIC BYPASS  2008   roux en y   MUCOSAL ADVANCEMENT FLAP N/A 12/10/2020   Procedure: MUCOSAL ADVANCEMENT FLAP;  Surgeon: Leighton Ruff, MD;  Location: [facility];  Service: General;  Laterality: N/A;  60 MIN   POLYPECTOMY  09/23/2016   Procedure:  POLYPECTOMY;  Surgeon: Lucilla Lame, MD;  Location: [place];  Service: Endoscopy;;   TOTAL VAGINAL HYSTERECTOMY  1999    Social History   Tobacco Use   Smoking status: Never   Smokeless tobacco: Never  Vaping Use   Vaping Use: Never used  Substance Use Topics   Alcohol use: Yes    Alcohol/week: 0.0 standard drinks of alcohol    Comment: RARE   Drug use: No     Medication list has been reviewed and updated.  Current Meds  Medication Sig   Ascorbic Acid (VITAMIN C) 1000 MG tablet Take 1,000 mg by mouth at bedtime.   Blood Glucose Monitoring Suppl (ONETOUCH VERIO REFLECT) w/Device KIT 1 each by Does not apply route daily at 6 (six) AM.   busPIRone (BUSPAR) 7.5 MG tablet Take 1 tablet (7.5 mg total) by mouth 2 (two) times daily.   cetirizine (ZYRTEC) 10 MG tablet Take 10 mg by mouth daily.   Cholecalciferol (VITAMIN D-3) 125 MCG (5000 UT) TABS Take 5,000 Units by mouth at bedtime.   Cyanocobalamin (VITAMIN B-12) 2500 MCG SUBL Place 2,500 mcg under the tongue at bedtime.   desvenlafaxine (PRISTIQ) 100 MG 24 hr tablet Take 1 tablet (100 mg total) by mouth every morning.   ferrous sulfate 325 (65 FE) MG tablet Take 325 mg by mouth at bedtime.   fluticasone (FLONASE) 50 MCG/ACT nasal spray Place 2 sprays into both nostrils daily. (Patient taking differently: Place 2 sprays into both nostrils daily as needed (sinus issues/allergies.).)   furosemide (LASIX) 20 MG tablet TAKE 1 TABLET DAILY (Patient taking differently: Take 20 mg by mouth daily as needed (fluid retention.).)   glucose blood test strip Use as instructed qid   ibuprofen (ADVIL) 800 MG tablet Take 1 tablet (800 mg total) by mouth every 8 (eight) hours as needed.   irbesartan (AVAPRO) 300 MG tablet TAKE 1 TABLET BY MOUTH DAILY (Patient taking differently: Take 150 mg by mouth daily.)   levothyroxine (SYNTHROID) 75 MCG tablet TAKE 1 TABLET BY MOUTH DAILY  BEFORE BREAKFAST   methocarbamol (ROBAXIN) 500 MG tablet 1/2-1  po qHS prn   OneTouch Delica Lancets 09B MISC 1 each by Does not apply route 4 (four) times daily as needed.   prazosin (MINIPRESS) 2 MG capsule TAKE 1 CAPSULE BY MOUTH AT  BEDTIME   PRISTIQ 100 MG 24 hr tablet Take 1 tablet (100 mg total) by mouth daily.   propranolol (INDERAL) 20 MG tablet Take 1 tablet (20 mg total) by mouth daily as needed. for anxiety   rOPINIRole (REQUIP) 4 MG tablet Take 1 tablet (4 mg total) by mouth at bedtime.   tamsulosin (FLOMAX) 0.4 MG CAPS capsule Take 1 capsule (0.4 mg total) by mouth daily as needed. (Patient taking differently: Take 0.4 mg by mouth daily as needed (Kidney stones).)   [START ON 10/07/2022] topiramate (  TOPAMAX) 100 MG tablet Take 2 tablets (200 mg total) by mouth at bedtime.   traMADol (ULTRAM) 50 MG tablet Take 25-50 mg by mouth 2 (two) times daily as needed (back pain.).    valACYclovir (VALTREX) 1000 MG tablet as needed.       10/03/2022   11:31 AM 06/28/2022    8:30 AM 06/22/2022    8:33 AM 05/26/2022   10:48 AM  GAD 7 : Generalized Anxiety Score  Nervous, Anxious, on Edge 3  3   Control/stop worrying 3  3   Worry too much - different things 3  3   Trouble relaxing 3  3   Restless 3  3   Easily annoyed or irritable 3  3   Afraid - awful might happen 3  3   Total GAD 7 Score 21  21   Anxiety Difficulty Extremely difficult  Not difficult at all      Information is confidential and restricted. Go to Review Flowsheets to unlock data.       10/03/2022   11:31 AM 06/28/2022    8:30 AM 06/22/2022    8:32 AM  Depression screen PHQ 2/9  Decreased Interest 3  3  Down, Depressed, Hopeless 3  3  PHQ - 2 Score 6  6  Altered sleeping 3  3  Tired, decreased energy 3  3  Change in appetite 2  3  Feeling bad or failure about yourself  2  1  Trouble concentrating 3  3  Moving slowly or fidgety/restless 3  2  Suicidal thoughts 0  0  PHQ-9 Score 22  21  Difficult doing work/chores Extremely dIfficult  Extremely dIfficult     Information is  confidential and restricted. Go to Review Flowsheets to unlock data.    BP Readings from Last 3 Encounters:  10/03/22 124/76  06/22/22 138/88  02/01/22 (!) 138/94    Physical Exam Constitutional:      Appearance: Normal appearance. She is well-developed.  HENT:     Right Ear: Ear canal and external ear normal. Tympanic membrane is retracted. Tympanic membrane is not erythematous.     Left Ear: Ear canal and external ear normal. Tympanic membrane is retracted. Tympanic membrane is not erythematous.     Nose:     Right Sinus: Maxillary sinus tenderness and frontal sinus tenderness present.     Left Sinus: Maxillary sinus tenderness and frontal sinus tenderness present.     Mouth/Throat:     Mouth: No oral lesions.     Pharynx: Uvula midline. Posterior oropharyngeal erythema present. No oropharyngeal exudate.  Cardiovascular:     Rate and Rhythm: Normal rate and regular rhythm.     Heart sounds: Normal heart sounds.  Pulmonary:     Breath sounds: Normal breath sounds. No wheezing or rales.  Musculoskeletal:     Cervical back: Normal range of motion.  Lymphadenopathy:     Cervical: No cervical adenopathy.  Neurological:     Mental Status: She is alert and oriented to person, place, and time.     Wt Readings from Last 3 Encounters:  10/03/22 162 lb (73.5 kg)  06/22/22 162 lb (73.5 kg)  02/01/22 155 lb 9.6 oz (70.6 kg)    BP 124/76   Pulse 82   Temp 98.2 F (36.8 C) (Oral)   Ht _0  (1.778 m)   Wt 162 lb (73.5 kg)   SpO2 94%   BMI 23.24 kg/m   Assessment and Plan: 1.  Cough with fever Suspect sinusitis but need to rule out Covid with recent exposure - POC COVID-19 - negative - promethazine-dextromethorphan (PROMETHAZINE-DM) 6.25-15 MG/5ML syrup; Take 5 mLs by mouth 4 (four) times daily as needed for up to 9 days for cough.  Dispense: 118 mL; Refill: 0  2. Acute non-recurrent maxillary sinusitis - fluticasone (FLONASE) 50 MCG/ACT nasal spray; Place 2 sprays into  both nostrils daily.  Dispense: 16 g; Refill: 6 - azithromycin (ZITHROMAX Z-PAK) 250 MG tablet; UAD  Dispense: 6 each; Refill: 0   Partially dictated using Editor, commissioning. Any errors are unintentional.  Halina Maidens, MD [facility]  10/03/2022

## 2022-10-04 ENCOUNTER — Ambulatory Visit (INDEPENDENT_AMBULATORY_CARE_PROVIDER_SITE_OTHER): Payer: 59 | Admitting: Clinical

## 2022-10-04 DIAGNOSIS — F331 Major depressive disorder, recurrent, moderate: Secondary | ICD-10-CM

## 2022-10-04 NOTE — Progress Notes (Signed)
                Dickson Kostelnik, LCSW 

## 2022-10-04 NOTE — Progress Notes (Signed)
[facility] Counselor/Therapist Progress Note  Patient ID: ANGELETTE GANUS, MRN: [id number],    Date: 10/04/2022  Time Spent: 9:36am - 10:29am : 53 minutes  Treatment Type: Individual Therapy  Reported Symptoms: Patient reported increased anxiety and difficulty sleeping.   Mental Status Exam: Appearance:  Well Groomed     Behavior: Appropriate  Motor: Normal  Speech/Language:  Clear and Coherent  Affect: Tearful  Mood: Patient stated, "horrible"  Thought process: normal  Thought content:   WNL  Sensory/Perceptual disturbances:   WNL  Orientation: oriented to person, place, and situation  Attention: Good  Concentration: Good  Memory: WNL  Fund of knowledge:  Good  Insight:   Good  Judgment:  Good  Impulse Control: Good   Risk Assessment: Danger to Self:  No Patient denied current suicidal ideation Self-injurious Behavior: No Danger to Others: No Patient denied current homicidal ideation Duty to Warn:no Physical Aggression / Violence:No  Access to Firearms a concern: No  Gang Involvement:No   Subjective: Patient reported she is no longer taking lunesta.  Patient reported she was unable to function when taking lunesta. Patient reported she is currently getting approximately 3 hours of sleep per night and has had several nights in which she was not able to sleep. Patient reported she is "exhausted". Patient reported appointment with psychiatrist last week to discuss her concerns regarding taking lunesta and difficulty sleeping. Patient reported "horrible" mood and stated, "my anxiety has been through the roof". Patient reported she goes to bed at approximately 10:30am each night. Patient stated, "I drink a lot of tea" and reported drinking caffeine tea throughout the day. Patient reported she watches television prior to going to bed each night and stated, "Ive got to work on that" in response to patient's screen time prior to going to bed. Patient reported plans to  switch to decaffeinated tea and completing crossword puzzles or coloring to decrease screen time. Patient reported a colostomy reversal surgery has been approved and is scheduled tentatively for February 2024. Patient stated, "I'm glad" in response to the upcoming surgery. Patient reported feeling the upcoming surgery will positively impact her self confidence. Patient reported feeling the colostomy bag prevents patient from wanting to being around others and is self conscious as a result. Patient reported she has been "short tempered" recently.  Patient reported she would like to start exercising again.    Interventions: Cognitive Behavioral Therapy. Clinician conducted session via WebEx video from clinician's home office. Patient provided verbal consent to proceed with telehealth session and participated in session from patient's home. Assessed patient's current symptoms and decrease in sleep. Assessed patient's sleep routine and provided psycho education related to healthy sleep hygiene. Explored and identified barriers to sleep, such as, caffeine intake, screen time prior to going to bed. Discussed strategies to improve patient's sleep hygiene, such as, decrease caffeine intake, decrease screen time, physical activity, and identified screen less activities (crossword puzzles, coloring, deep breathing exercises). Provided psycho education related to the impact of sleep and exercise on mood. Discussed starting with a goal of some movement each day to increase physical activity. Explored patient's thoughts/feelings in response to upcoming surgery and the potential impact on patient's self confidence. Discussed patient's upcoming trip to visit her brother in law and her mother and identified coping strategies for patient to utilize in response to stressors, such as, deep breathing techniques, focusing on the desired outcome.    Diagnosis:  Major Depressive Disorder, recurrent, moderate R/O Generalized Anxiety  Disorder R/O  Social Anxiety Disorder   Treatment Plan: Patient is to utilize Public relations account executive Therapy, Dialectical Behavior Therapy Skills, and coping strategies to help decrease symptoms associated with Anxiety and Major Depressive Disorder.   Frequency: Weekly  Modality: individual therapy    Long-term goal:   Patient stated, "I would like to be my old confident self again" as evidence by decrease in hands shaking, knees shaking, voice quivering, and feelings of anxiety from every time there is a social interaction to 0 times when there is a social interaction   Target Date: 06/17/23  Progress: progressing    Decrease in depressive symptoms as evidence by decrease in crying, loss of interest, lack of energy, difficulty falling asleep/staying asleep from 6 to 7 days per week to 0 to 1 days per week for duration of 6 months to 1 year   Target Date: 06/17/23  Progress: progressing    Short-term goal:  Develop understanding of the relationship between loss of interest in activities to depression and anxiety and the impact on patient's thought patterns and behaviors   Target Date: 12/16/22  Progress: progressing    Develop understanding between patient's thoughts and feelings, such as, automatic negative thoughts, core beliefs, and worry on patient's level of confidence.    Target Date: 12/16/22  Progress: progressing     Katherina Right, LCSW

## 2022-10-16 ENCOUNTER — Other Ambulatory Visit: Payer: Self-pay | Admitting: Internal Medicine

## 2022-10-16 DIAGNOSIS — E033 Postinfectious hypothyroidism: Secondary | ICD-10-CM

## 2022-10-24 ENCOUNTER — Telehealth: Payer: Self-pay

## 2022-10-24 NOTE — Telephone Encounter (Signed)
pt asked is a 90 day supply can be sent to the [facility] generic pristiq

## 2022-10-26 ENCOUNTER — Other Ambulatory Visit: Payer: Self-pay | Admitting: Psychiatry

## 2022-10-26 DIAGNOSIS — F331 Major depressive disorder, recurrent, moderate: Secondary | ICD-10-CM

## 2022-10-26 MED ORDER — DESVENLAFAXINE SUCCINATE ER 100 MG PO TB24: 100.0000 mg | ORAL_TABLET | Freq: Every morning | ORAL | 1 refills | Status: DC

## 2022-10-26 NOTE — Telephone Encounter (Signed)
ordered

## 2022-10-26 NOTE — Telephone Encounter (Signed)
needs a 90 day supply of the generic pristiq sent in to the [facility] in [place].

## 2022-10-26 NOTE — Telephone Encounter (Signed)
Pt.notified

## 2022-11-02 ENCOUNTER — Ambulatory Visit (INDEPENDENT_AMBULATORY_CARE_PROVIDER_SITE_OTHER): Payer: 59 | Admitting: Clinical

## 2022-11-02 DIAGNOSIS — F331 Major depressive disorder, recurrent, moderate: Secondary | ICD-10-CM

## 2022-11-02 NOTE — Progress Notes (Signed)
[facility] Counselor/Therapist Progress Note  Patient ID: ARONDA BURFORD, MRN: [id number],    Date: 11/02/2022  Time Spent: 9:31am - 10:32am : 61 minutes   Treatment Type: Individual Therapy  Reported Symptoms: Patient reported difficulty falling asleep and staying asleep.  Mental Status Exam: Appearance:  Neat     Behavior: Appropriate  Motor: Normal  Speech/Language:  Clear and Coherent  Affect: Tearful  Mood: sad  Thought process: normal  Thought content:   WNL  Sensory/Perceptual disturbances:   WNL  Orientation: oriented to person, place, and situation  Attention: Good  Concentration: Good  Memory: WNL  Fund of knowledge:  Good  Insight:   Good  Judgment:  Good  Impulse Control: Good   Risk Assessment: Danger to Self:  No Patient denied current suicidal ideation Self-injurious Behavior: No Danger to Others: No Patient denied current homicidal ideation Duty to Warn:no Physical Aggression / Violence:No  Access to Firearms a concern: No  Gang Involvement:No   Subjective: Patient reported she switched to decaffeinated tea but has not seen a difference in her sleep. Patient reported continued difficulty falling asleep and staying asleep. Patient reported difficulty completing daily tasks due to fatigue. Patient stated, "I'm exhausted". Patient reported her 25 year old dog died yesterday. Patient reported her mother's health is declining and patient would like her mother to live with patient. Patient stated, "she will not budge" and reported her mother will not relocate to live with patient. Patient reported her mother has asked patient to sell her home and relocate to the area her mother lives in to care for her mother.  Patient reported relocating to another part of the state is not an option. Patient reported her support system is local to her area. Patient reported her mother utilizes guilt when discussing patient moving to care for her mother in her  mother's home. Patient reported her mother indicates she will have to move to a long term care community if patient doesn't relocate to provide care for mother. Patient reported her mother is not willing to compromise. Patient reported she is trying to be optimistic about the possibility of caring for her mother in patient's home. Patient inquired about long term care disability forms and [place] treatment for depression.  Interventions: Cognitive Behavioral Therapy. Clinician conducted session via WebEx video from clinician's office at [facility]. Patient provided verbal consent to proceed with telehealth session and participated in session from patient's home. Reviewed strategies discussed during previous session to improve patient's sleep hygiene and the outcome. Discussed recent stressors impacting patient's mood. Processed patient's thoughts/feelings regarding becoming a caregiver for her mother and the potential impact on their relationship. Provided psycho education related to communication styles, such as, aggressive communication. Explored caregiving options and the potential impact of various options on patient's physical and mental health. Provided psycho education related to caregiving, the importance of self care, and impact of caregiving on a caregiver's health. Clinician requested patient list the pros/cons for each caregiving option. Clinician recommended patient follow up with psychiatrist to discuss long term disability inquiry and [place] treatment.   Diagnosis:  Major Depressive Disorder, recurrent, moderate R/O Generalized Anxiety Disorder R/O Social Anxiety Disorder   Treatment Plan: Patient is to utilize Public relations account executive Therapy, Dialectical Behavior Therapy Skills, and coping strategies to help decrease symptoms associated with Anxiety and Major Depressive Disorder.   Frequency: Weekly  Modality: individual therapy    Long-term goal:   Patient stated, "I would like to be  my  old confident self again" as evidence by decrease in hands shaking, knees shaking, voice quivering, and feelings of anxiety from every time there is a social interaction to 0 times when there is a social interaction   Target Date: 06/17/23  Progress: progressing    Decrease in depressive symptoms as evidence by decrease in crying, loss of interest, lack of energy, difficulty falling asleep/staying asleep from 6 to 7 days per week to 0 to 1 days per week for duration of 6 months to 1 year   Target Date: 06/17/23  Progress: progressing    Short-term goal:  Develop understanding of the relationship between loss of interest in activities to depression and anxiety and the impact on patient's thought patterns and behaviors   Target Date: 12/16/22  Progress: progressing    Develop understanding between patient's thoughts and feelings, such as, automatic negative thoughts, core beliefs, and worry on patient's level of confidence.    Target Date: 12/16/22  Progress: progressing    Katherina Right, LCSW

## 2022-11-02 NOTE — Progress Notes (Signed)
                Katherina Right, LCSW

## 2022-11-05 ENCOUNTER — Other Ambulatory Visit: Payer: Self-pay | Admitting: Psychiatry

## 2022-11-07 ENCOUNTER — Other Ambulatory Visit: Payer: Self-pay | Admitting: Internal Medicine

## 2022-11-07 DIAGNOSIS — G2581 Restless legs syndrome: Secondary | ICD-10-CM

## 2022-11-09 ENCOUNTER — Ambulatory Visit: Payer: 59 | Admitting: Clinical

## 2022-11-16 ENCOUNTER — Ambulatory Visit: Payer: 59 | Admitting: Clinical

## 2022-11-17 DIAGNOSIS — Z9889 Other specified postprocedural states: Secondary | ICD-10-CM

## 2022-11-17 HISTORY — DX: Other specified postprocedural states: Z98.890

## 2022-11-22 NOTE — Progress Notes (Unsigned)
Virtual Visit via Video Note  I connected with Toni Green on 11/23/22 at  9:30 AM EST by a video enabled telemedicine application and verified that I am speaking with the correct person using two identifiers.  Location: Patient: home Provider: office Persons participated in the visit- patient, provider    I discussed the limitations of evaluation and management by telemedicine and the availability of in person appointments. The patient expressed understanding and agreed to proceed.     I discussed the assessment and treatment plan with the patient. The patient was provided an opportunity to ask questions and all were answered. The patient agreed with the plan and demonstrated an understanding of the instructions.   The patient was advised to call back or seek an in-person evaluation if the symptoms worsen or if the condition fails to improve as anticipated.  I provided 20 minutes of non-face-to-face time during this encounter.   Norman Clay, MD    [facility] MD/PA/NP OP Progress Note  11/23/2022 10:03 AM Toni Green  MRN:  [id number]  Chief Complaint:  Chief Complaint  Patient presents with   Follow-up   HPI:  This is a follow-up appointment for depression and insomnia.  She states that she is still not sleeping well.  3 hours will be the best if she is able to sleep.  She has started counseling instead of watching the phone.  She has started to take a nap up to 45 minutes to get her body rested.  She spends time most of the time at home.  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  She bored.  She may visit neighbor friend.  The relationship with her mother is not well.  She had a good Christmas with her son, grandchildren and daughter-in-law.  She feels depressed.  She feels very anxious about the upcoming surgery on February 23.  She has no energy.  She denies SI stating that she wants to feel better.  She tearfully describes that she is concerned whether it is always  this way.  She has been eating a little more lately. She feels anxious, and takes propranolol every day. She agrees to measure body weight prior to the visit each time.    Wt Readings from Last 3 Encounters:  10/03/22 162 lb (73.5 kg)  06/28/22 164 lb (74.4 kg)  06/22/22 162 lb (73.5 kg)     Employment: Scientist, water quality, supporting president since 2016, healthcare IT for 30 years Support: husband Household: husband, puppy Marital status: married since 1994 Number of children: 2 grown sons (one from previous marriage, another one from her husband's previous marriage), 2 grandchildren (age 53,13)  Visit Diagnosis:    ICD-10-CM   1. Major depressive disorder, recurrent episode, moderate (HCC)  F33.1     2. Insomnia, unspecified type  G47.00     3. Binge eating disorder  F50.81       Past Psychiatric History: Please see initial evaluation for full details. I have reviewed the history. No updates at this time.     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   Claustrophobia    Complication of surgery 0/10/7791   Overview:  Dilated pouch found  on EGD done 12/2014    Depression    History of kidney stones    Hypertension    Hypoglycemia    Hypothyroidism    Inflammation of sacroiliac joint ([place]) 08/22/2014   Pneumonia 03/2020   PONV (postoperative nausea and vomiting)    NASUEATED/BAD HEADACHES   Seizures ([place]) 1985   x1, after labor/childbirth   Thyroid disease    Wears glasses     Past Surgical History:  Procedure Laterality Date   APPENDECTOMY  2010   BACK SURGERY  10/2005   lumbar   COLONOSCOPY WITH PROPOFOL N/A 09/23/2016   Procedure: COLONOSCOPY WITH PROPOFOL;  Surgeon: Lucilla Lame, MD;  Location: [facility];  Service: Endoscopy;   Laterality: N/A;   COLOSTOMY  10/22/2021   CYSTOSCOPY     x 1   EVALUATION UNDER ANESTHESIA WITH HEMORRHOIDECTOMY N/A 07/06/2020   Procedure: EXAM UNDER ANESTHESIA WITH HEMORRHOIDECTOMY;  Surgeon: Fredirick Maudlin, MD;  Location: [facility];  Service: General;  Laterality: N/A;   GALLBLADDER SURGERY  2009   lap   GASTRIC BYPASS  2008   roux en y   [place] N/A 12/10/2020   Procedure: MUCOSAL ADVANCEMENT FLAP;  Surgeon: Leighton Ruff, MD;  Location: [facility];  Service: General;  Laterality: N/A;  60 MIN   POLYPECTOMY  09/23/2016   Procedure: POLYPECTOMY;  Surgeon: Lucilla Lame, MD;  Location: [facility];  Service: Endoscopy;;   TOTAL VAGINAL HYSTERECTOMY  1999    Family Psychiatric History: Please see initial evaluation for full details. I have reviewed the history. No updates at this time.     Family History:  Family History  Problem Relation Age of Onset   Depression Mother    Hypertension Mother    Depression Father    Hypertension Father    Depression Sister    Breast cancer Sister 27   Hypertension Maternal Grandfather    Depression Maternal Grandmother    Hypertension Maternal Grandmother    Hypertension Paternal Grandfather    Hypertension Paternal Grandmother     Social History:  Social History   Socioeconomic History   Marital status: Married    Spouse name: Not on file   Number of children: Not on file   Years of education: Not on file   Highest education level: Not on file  Occupational History   Not on file  Tobacco Use   Smoking status: Never   Smokeless tobacco: Never  Vaping Use   Vaping Use: Never used  Substance and Sexual Activity   Alcohol use: Yes    Alcohol/week: 0.0 standard drinks of alcohol    Comment: RARE   Drug use: No   Sexual activity: Not Currently  Other Topics Concern   Not on file  Social History Narrative   Not on file   Social Determinants of Health   Financial Resource Strain:  Low Risk  (06/22/2022)   Overall Financial Resource Strain (CARDIA)    Difficulty of Paying Living Expenses: Not hard at all  Food Insecurity: No Food Insecurity (06/22/2022)   Hunger Vital Sign    Worried About Running Out of Food in the Last Year: Never true    Ran Out of Food in the Last Year: Never true  Transportation Needs: No Transportation Needs (06/22/2022)   PRAPARE - Hydrologist (Medical): No    Lack of Transportation (Non-Medical): No  Physical Activity: Not on file  Stress: Not on file  Social Connections: Not on file    Allergies:  Allergies  Allergen Reactions   Losartan Palpitations    Could feel heartbeat (strongly) in neck   Penicillins Rash   Tape Rash    Skin irritation after back surgery white paper tape    Metabolic Disorder Labs: No results found for: "HGBA1C", "MPG" No results found for: "PROLACTIN" Lab Results  Component Value Date   CHOL 225 (H) 02/01/2022   TRIG 67 02/01/2022   HDL 119 02/01/2022   CHOLHDL 1.9 02/01/2022   LDLCALC 94 02/01/2022   LDLCALC 126 (H) 01/07/2021   Lab Results  Component Value Date   TSH 2.330 02/01/2022   TSH 1.360 01/07/2021    Therapeutic Level Labs: No results found for: "LITHIUM" No results found for: "VALPROATE" No results found for: "CBMZ"  Current Medications: Current Outpatient Medications  Medication Sig Dispense Refill   Lemborexant 5 MG TABS Take 1 tablet (5 mg total) by mouth at bedtime as needed. 30 tablet 1   Ascorbic Acid (VITAMIN C) 1000 MG tablet Take 1,000 mg by mouth at bedtime.     Blood Glucose Monitoring Suppl (ONETOUCH VERIO REFLECT) w/Device KIT 1 each by Does not apply route daily at 6 (six) AM. 1 kit 0   busPIRone (BUSPAR) 7.5 MG tablet Take 1 tablet (7.5 mg total) by mouth 2 (two) times daily. 180 tablet 0   cetirizine (ZYRTEC) 10 MG tablet Take 10 mg by mouth daily.     Cholecalciferol (VITAMIN D-3) 125 MCG (5000 UT) TABS Take 5,000 Units by mouth at  bedtime.     Cyanocobalamin (VITAMIN B-12) 2500 MCG SUBL Place 2,500 mcg under the tongue at bedtime.     desvenlafaxine (PRISTIQ) 100 MG 24 hr tablet Take 1 tablet (100 mg total) by mouth every morning. 90 tablet 1   desvenlafaxine (PRISTIQ) 100 MG 24 hr tablet Take 1 tablet (100 mg total) by mouth daily. 90 tablet 0   ferrous sulfate 325 (65 FE) MG tablet Take 325 mg by mouth at bedtime.     fluticasone (FLONASE) 50 MCG/ACT nasal spray Place 2 sprays into both nostrils daily. 16 g 6   furosemide (LASIX) 20 MG tablet TAKE 1 TABLET DAILY (Patient taking differently: Take 20 mg by mouth daily as needed (fluid retention.).) 90 tablet 3   glucose blood test strip Use as instructed qid 100 each 12   ibuprofen (ADVIL) 800 MG tablet Take 1 tablet (800 mg total) by mouth every 8 (eight) hours as needed. 30 tablet 0   irbesartan (AVAPRO) 300 MG tablet TAKE 1 TABLET BY MOUTH DAILY (Patient taking differently: Take 150 mg by mouth daily.) 90 tablet 1   levothyroxine (SYNTHROID) 75 MCG tablet TAKE 1 TABLET BY MOUTH DAILY  BEFORE BREAKFAST 90 tablet 1   magnesium oxide (MAG-OX) 400 MG tablet Take 400 mg by mouth at bedtime.     methocarbamol (ROBAXIN) 500 MG tablet 1/2-1 po qHS prn     OneTouch Delica Lancets 54O MISC 1 each by Does not apply route 4 (four) times daily as needed. 100 each 12   prazosin (MINIPRESS) 2 MG capsule TAKE 1 CAPSULE BY MOUTH AT  BEDTIME 90 capsule 1   propranolol (INDERAL) 20 MG tablet Take 1 tablet (20 mg total) by mouth daily as needed. for anxiety 90 tablet 0   rOPINIRole (REQUIP) 4 MG tablet TAKE 1 TABLET BY MOUTH AT  BEDTIME 90 tablet 3   tamsulosin (FLOMAX) 0.4 MG CAPS capsule Take 1  capsule (0.4 mg total) by mouth daily as needed. (Patient taking differently: Take 0.4 mg by mouth daily as needed (Kidney stones).) 30 capsule 2   topiramate (TOPAMAX) 100 MG tablet Take 2 tablets (200 mg total) by mouth at bedtime. 180 tablet 1   traMADol (ULTRAM) 50 MG tablet Take 25-50 mg by  mouth 2 (two) times daily as needed (back pain.).      valACYclovir (VALTREX) 1000 MG tablet as needed.     No current facility-administered medications for this visit.     Musculoskeletal: Strength & Muscle Tone:  N/A Gait & Station:  N/A Patient leans: N/A  Psychiatric Specialty Exam: Review of Systems  Psychiatric/Behavioral:  Positive for decreased concentration, dysphoric mood and sleep disturbance. Negative for agitation, behavioral problems, confusion, hallucinations, self-injury and suicidal ideas. The patient is nervous/anxious. The patient is not hyperactive.   All other systems reviewed and are negative.   There were no vitals taken for this visit.There is no height or weight on file to calculate BMI.  General Appearance: Fairly Groomed  Eye Contact:  Good  Speech:  Clear and Coherent  Volume:  Normal  Mood:   same  Affect:  Appropriate and Congruent  Thought Process:  Coherent  Orientation:  Full (Time, Place, and Person)  Thought Content: Logical   Suicidal Thoughts:  No  Homicidal Thoughts:  No  Memory:  Immediate;   Good  Judgement:  Good  Insight:  Good  Psychomotor Activity:  Normal  Concentration:  Concentration: Good and Attention Span: Good  Recall:  Good  Fund of Knowledge: Good  Language: Good  Akathisia:  No  Handed:  Right  AIMS (if indicated): not done  Assets:  Communication Skills Desire for Improvement  ADL's:  Intact  Cognition: WNL  Sleep:  Poor   Screenings: GAD-7    Flowsheet Row Office Visit from 10/03/2022 in [facility] at [place] Visit from 06/28/2022 in [place] Office Visit from 06/22/2022 in [place] at [place] Visit from 05/26/2022 in [place] Office Visit from 05/05/2022 in [place]  Total GAD-7 Score '21 21  21 21 21      '$ PHQ2-9    [place] Office Visit from 10/03/2022 in [place] at [place] Visit from 06/28/2022 in [place] Office Visit from 06/22/2022 in [place] at [place] Visit from 05/26/2022 in [place] Office Visit from 05/05/2022 in [place]  PHQ-2 Total Score '6 6 6 6 6  '$ PHQ-9 Total Score '22 24 21 24 21      '$ [place] Office Visit from 06/28/2022 in [place] Office Visit from 05/26/2022 in [place] Office Visit from 05/05/2022 in [place] No Risk No Risk No Risk        Assessment and Plan:  BIANA HAGGAR is a 61 y.o. year old female with a history of depression, binge eating disorder, hypertension, hypothyroidism, s/p roux-en-y gastric bypass in 2008, who presents for follow up appointment for below.    1. Major depressive disorder, recurrent episode, moderate (HCC) Acute stressors include: conflict with her mother, who has medical condition,  colostomy reversal on Feb 23  Other stressors include: complication from surgery, unemployment, financial strain, loss of her sister in Nov 2022   History:   There has been overall improvement in her daily activity, although she continues to struggle with depressive symptoms and anxiety.  She is willing to consider trying [place] after the upcoming procedure to reverse the colostomy, although she feels somewhat apprehensive about it.  Will continue current medication regimen with the hope that her mood improves following  the intervention for sleep.  Will continue Pristiq to target depression.  Will continue BuSpar for anxiety.  Will continue propranolol as needed for  anxiety.  Noted that she prefers not to start any medication which can potentially cause weight gain.   2. Insomnia, unspecified type Unstable.  Will try lemborexant to target insomnia.  Discussed potential risk of drowsiness.   3. Binge eating disorder Slightly worsening.  Will continue topiramate at the current dose for binge eating.  She denies any side effect of drowsiness from the medication.     # Alcohol use She previously reported ongoing alcohol use for anxiety, despite she does not like the taste of it.  She denies craving for alcohol.  Will continue motivational interview.    Plan  Continue Pristiq 100 mg daily  - optum, generic Continue Buspar 7.5 mg twice a day (limited benefit from uptitration) Continue propranolol 20 mg daily as needed for anxiety Continue topiramate 200 mg daily Start lemborexant 5 mg at night as needed for sleep - [facility] Next appointment-  3/27 at 11:30, video She agrees to do in person visit at least every few months (she reports concern due to financial strain) - on tramadol - EKG QTc 394 msec on 11/2020   Past trials of medication- sertraline, lexapro, venlafaxine, bupropion (palpitation), Abilify, quetiapine (eating sweets), Geodon (increase in appetite). Ambien, Lunesta (severe drowsiness)          Collaboration of Care: Collaboration of Care: Other reviewed notes in Epic  Patient/Guardian was advised Release of Information must be obtained prior to any record release in order to collaborate their care with an outside provider. Patient/Guardian was advised if they have not already done so to contact the registration department to sign all necessary forms in order for [place] to release information regarding their care.   Consent: Patient/Guardian gives verbal consent for treatment and assignment of benefits for services provided during this visit. Patient/Guardian expressed understanding and agreed to proceed.    Norman Clay, MD 11/23/2022, 10:03  AM

## 2022-11-23 ENCOUNTER — Encounter: Payer: Self-pay | Admitting: Psychiatry

## 2022-11-23 ENCOUNTER — Telehealth: Payer: Self-pay

## 2022-11-23 ENCOUNTER — Telehealth (INDEPENDENT_AMBULATORY_CARE_PROVIDER_SITE_OTHER): Payer: 59 | Admitting: Psychiatry

## 2022-11-23 DIAGNOSIS — F5081 Binge eating disorder: Secondary | ICD-10-CM

## 2022-11-23 DIAGNOSIS — G4700 Insomnia, unspecified: Secondary | ICD-10-CM

## 2022-11-23 DIAGNOSIS — F331 Major depressive disorder, recurrent, moderate: Secondary | ICD-10-CM

## 2022-11-23 MED ORDER — DESVENLAFAXINE SUCCINATE ER 100 MG PO TB24: 100.0000 mg | ORAL_TABLET | Freq: Every day | ORAL | 0 refills | Status: DC

## 2022-11-23 MED ORDER — LEMBOREXANT 5 MG PO TABS: 5.0000 mg | ORAL_TABLET | Freq: Every evening | ORAL | 1 refills | Status: DC | PRN

## 2022-11-23 NOTE — Telephone Encounter (Signed)
went onlline and submitted the prior auth for the deyvigo - pending.

## 2022-11-23 NOTE — Telephone Encounter (Signed)
received fax from [email or web address] that a prior auth was needed for the dayvigo '5mg'$ 

## 2022-11-23 NOTE — Patient Instructions (Signed)
Continue Pristiq 100 mg daily   Continue Buspar 7.5 mg twice a day  Continue propranolol 20 mg daily as needed for anxiety Continue topiramate 200 mg daily Start lemborexant 5 mg at night as needed for sleep  Next appointment-  3/27 at 11:30

## 2022-11-23 NOTE — Telephone Encounter (Signed)
received notice that davyigo was approved. PA- [id number] approved until 11-24-23

## 2022-11-28 ENCOUNTER — Ambulatory Visit (INDEPENDENT_AMBULATORY_CARE_PROVIDER_SITE_OTHER): Payer: 59 | Admitting: Clinical

## 2022-11-28 DIAGNOSIS — F331 Major depressive disorder, recurrent, moderate: Secondary | ICD-10-CM | POA: Diagnosis not present

## 2022-11-28 NOTE — Progress Notes (Signed)
[facility] Counselor/Therapist Progress Note  [name], MRN: [id number],    Date: 11/28/2022  Time Spent: 10:32am - 11:29am : 57 minutes  Treatment Type: Individual Therapy  Reported Symptoms: Patient reported fatigue and continues to experience difficulty sleeping.   Mental Status Exam: Appearance:  Well Groomed     Behavior: Appropriate  Motor: Normal  Speech/Language:  Clear and Coherent  Affect: Tearful  Mood: anxious and depressed  Thought process: normal  Thought content:   WNL  Sensory/Perceptual disturbances:   WNL  Orientation: oriented to person, place, and situation  Attention: Good  Concentration: Good  Memory: WNL  Fund of knowledge:  Good  Insight:   Good  Judgment:  Good  Impulse Control: Good   Risk Assessment: Danger to Self:  No Patient denied current suicidal ideation Self-injurious Behavior: No Danger to Others: No Patient denied current homicidal ideation Duty to Warn:no Physical Aggression / Violence:No  Access to Firearms a concern: No  Gang Involvement:No   Subjective: Patient stated, "I'm exhausted" and reported fatigue. Patient reported two hours of sleep last night. Patient reported psychiatrist recently prescribed a new medication for sleep that patient plans to start today. Patient reported she experiences ruminating thoughts at night. Patient stated, "I can certainly try that" in response to journaling prior to going to bed. Patient reported she has not colored since last session and stated,  "I don't know why". Patient reported she has been assisting a neighbor with a project and stated, "that's been a good thing for me". Patient stated, "I've enjoyed that so much" in response to project and stated, "that kind of thing makes me happy". Patient stated, "things are probably the worst with my mom".  Patient reported her mother continues to use "guilt trips" in response to patient. Patient stated, "I'm not going to  push it anymore" in regards to caregiving options for her mother. Patient reported she followed up with psychiatrist regarding [place] and psychiatrist recommended [place] for treatment of depression. Patient reported feeling anxious about [place] but plans to follow up with consultation.  Patient reported upcoming colostomy reversal surgery on February 23rd and reported feeling anxious about the upcoming surgery.     Interventions: Cognitive Behavioral Therapy. Clinician conducted session via WebEx video from clinician's home office. Patient provided verbal consent to proceed with telehealth session and participated in session from patient's home. Reviewed events since last session. Reviewed strategies discussed during previous session to improve patient's sleep hygiene and the outcome. Explored and identified potential triggers for difficulty falling asleep. Identified strategies to utilize in response to ruminating thoughts, such as, patient journaling her thoughts prior to going to sleep. Provided psycho education related to the use of journaling prior to going to sleep. Discussed recent events that had a positive impact on patient's mood. Processed patient's thoughts/feelings regarding status of her relationship with her mother and caregiving for her mother. Reviewed strategies discussed during previous session related to patient's inquiry about [place]. Processed patient's thoughts/feelings related to upcoming surgery and identified coping strategies to utilize in response to anxiety. Provided psycho education related to focusing on the outcome of the surgery and developing a list of positives.    Diagnosis:  Major Depressive Disorder, recurrent, moderate R/O Generalized Anxiety Disorder R/O Social Anxiety Disorder   Treatment Plan: Patient is to utilize Public relations account executive Therapy, Dialectical Behavior Therapy Skills, and coping strategies to help decrease symptoms associated with Anxiety and Major Depressive  Disorder.   Frequency: Weekly  Modality:  individual therapy    Long-term goal:   Patient stated, "I would like to be my old confident self again" as evidence by decrease in hands shaking, knees shaking, voice quivering, and feelings of anxiety from every time there is a social interaction to 0 times when there is a social interaction   Target Date: 06/17/23  Progress: progressing    Decrease in depressive symptoms as evidence by decrease in crying, loss of interest, lack of energy, difficulty falling asleep/staying asleep from 6 to 7 days per week to 0 to 1 days per week for duration of 6 months to 1 year   Target Date: 06/17/23  Progress: progressing    Short-term goal:  Develop understanding of the relationship between loss of interest in activities to depression and anxiety and the impact on patient's thought patterns and behaviors   Target Date: 12/16/22  Progress: progressing    Develop understanding between patient's thoughts and feelings, such as, automatic negative thoughts, core beliefs, and worry on patient's level of confidence.    Target Date: 12/16/22  Progress: progressing     Katherina Right, LCSW

## 2022-11-28 NOTE — Progress Notes (Signed)
                Leverne Tessler, LCSW 

## 2022-12-04 ENCOUNTER — Other Ambulatory Visit: Payer: Self-pay | Admitting: Internal Medicine

## 2022-12-04 DIAGNOSIS — I10 Essential (primary) hypertension: Secondary | ICD-10-CM

## 2022-12-19 ENCOUNTER — Ambulatory Visit: Payer: Self-pay | Admitting: *Deleted

## 2022-12-19 ENCOUNTER — Other Ambulatory Visit: Payer: Self-pay | Admitting: Internal Medicine

## 2022-12-19 ENCOUNTER — Telehealth: Payer: Self-pay

## 2022-12-19 DIAGNOSIS — Z20828 Contact with and (suspected) exposure to other viral communicable diseases: Secondary | ICD-10-CM

## 2022-12-19 MED ORDER — OSELTAMIVIR PHOSPHATE 75 MG PO CAPS: 75.0000 mg | ORAL_CAPSULE | Freq: Two times a day (BID) | ORAL | 0 refills | Status: AC

## 2022-12-19 NOTE — Telephone Encounter (Signed)
Summary: husband tested positive for the flu   Pt stated she was just released from the hospital yesterday 03/04 from having surgery. She stated that her husband has the flu, and the doctor advised her to let Dr.Berglund know to see if she could get something to keep her from getting it. Pt state her tempeture is 99.3 .   Pt seeking clinical advice.         Reason for Disposition . Patient is HIGH RISK (e.g., age > 50 years, pregnant, HIV+, or chronic medical condition)  Answer Assessment - Initial Assessment Questions 1. WORST SYMPTOM: "What is your worst symptom?" (e.g., cough, runny nose, muscle aches, headache, sore throat, fever)      Fever, trouble with deep breath- since surgery-in patient 9 days 2. ONSET: "When did your flu symptoms start?"      Low grade temperature at hospital- off/on 3. COUGH: "How bad is the cough?"       no 4. RESPIRATORY DISTRESS: "Describe your breathing."      Can't take whole deep breath- patient has been using her incentive spirometer  5. FEVER: "Do you have a fever?" If Yes, ask: "What is your temperature, how was it measured, and when did it start?"     99.3- oral 6. EXPOSURE: "Were you exposed to someone with influenza?"       yes 7. FLU VACCINE: "Did you get a flu shot this year?"     yes 8. HIGH RISK DISEASE: "Do you have any chronic medical problems?" (e.g., heart or lung disease, asthma, weak immune system, or other HIGH RISK conditions)     Post surgical  10. OTHER SYMPTOMS: "Do you have any other symptoms?"  (e.g., runny nose, muscle aches, headache, sore throat)       Nasal congestion during the night- now is clear  Answer Assessment - Initial Assessment Questions 1. TYPE of EXPOSURE: "How were you exposed?" (e.g., close contact, not a close contact)     Husband diagnosed yesterday at [facility] 2. DATE of EXPOSURE: "When did the exposure occur?" (e.g., hour, days, weeks)     During hospital stay - home exposure  4. HIGH RISK for  COMPLICATIONS: "Do you have any heart or lung problems?" "Do you have a weakened immune system?" (e.g., CHF, COPD, asthma, HIV positive, chemotherapy, renal failure, diabetes mellitus, sickle cell anemia)     Recent surgery 5. SYMPTOMS: "Do you have any symptoms?" (e.g., cough, fever, sore throat, difficulty breathing).     Low grade temperature  Protocols used: Influenza Exposure-A-AH, Influenza - Seasonal-A-AH

## 2022-12-19 NOTE — Transitions of Care (Post Inpatient/ED Visit) (Signed)
   12/19/2022  Name: Toni Green MRN: [id number] DOB: [birth year]  Today's TOC FU Call Status: Today's TOC FU Call Status:: Successful TOC FU Call Competed TOC FU Call Complete Date: 12/19/22  Transition Care Management Follow-up Telephone Call Date of Discharge: 12/18/22 Discharge Facility: Other ([place]) Name of Other ([place]) Discharge Facility: [facility] Type of Discharge: Inpatient Admission Primary Inpatient Discharge Diagnosis:: ovarian cyst How have you been since you were released from the hospital?: Better Any questions or concerns?: No  Items Reviewed: Did you receive and understand the discharge instructions provided?: Yes Medications obtained and verified?: Yes (Medications Reviewed) Any new allergies since your discharge?: No Dietary orders reviewed?: Yes Do you have support at home?: Yes People in Home: spouse  Home Care and Equipment/Supplies: [place] Ordered?: NA Any new equipment or medical supplies ordered?: NA  Functional Questionnaire: Do you need assistance with bathing/showering or dressing?: Yes Do you need assistance with meal preparation?: Yes Do you need assistance with eating?: No Do you have difficulty maintaining continence: No Do you need assistance with getting out of bed/getting out of a chair/moving?: Yes Do you have difficulty managing or taking your medications?: No  Folllow up appointments reviewed: PCP Follow-up appointment confirmed?: [facility] Follow-up appointment confirmed?: No Reason Specialist Follow-Up Not Confirmed: Patient has Specialist Provider Number and will Call for Appointment Do you need transportation to your follow-up appointment?: No Do you understand care options if your condition(s) worsen?: Yes-patient verbalized understanding    Ramer, Athalia Nurse Health Advisor Direct Dial [phone]

## 2022-12-19 NOTE — Telephone Encounter (Signed)
  Chief Complaint: flu exposure at home Symptoms: low grade temperature, nasal drainage Frequency: post surgical- came home yesterday- so symptoms are hard to differentiate  Pertinent Negatives: Patient denies sore throat, headache Disposition: '[]'$ ED /'[]'$ Urgent Care (no appt availability in office) / '[]'$ Appointment(In office/virtual)/ '[]'$  [employer] Virtual Care/ '[]'$ Home Care/ '[]'$ Refused Recommended Disposition /'[]'$  Mobile Bus/ '[x]'$  Follow-up with PCP Additional Notes: Patient was released form 9 day hospital stay yesterday. Patient husband tested + flu yesterday. Patient would like to have Rx for prevention and she is weak from surgery and does not want to get the flu. Patient states she does not want to come to the office.

## 2022-12-19 NOTE — Telephone Encounter (Signed)
Left vm informing patient it was sent in.  Toni Green

## 2022-12-20 ENCOUNTER — Ambulatory Visit: Payer: 59 | Admitting: Clinical

## 2023-01-09 NOTE — Progress Notes (Signed)
BH MD/PA/NP OP Progress Note  01/11/2023 12:09 PM Toni Green  MRN:  [id number]  Chief Complaint:  Chief Complaint  Patient presents with   Follow-up   HPI: since the lat visit,  - she had BILATERAL SALPINGO-OOPHORECTOMY LAPAROSCOPIC- JOINT CASE WITH DR. Chadbourn AND WILL BE SCHEDULED IN HER ROOM (Bilateral) COLOSTOMY CLOSURE with parastomal hernia repair (N/A)  in Feb 2024.   She states that she has been doing okay.  She had a colostomy reversal, removal of ovaries and irrigation repair, blepharoplasty since the last visit.  She feels glad and excited to have done the reversal of colostomy.  She lost 19 pounds over the past month due to nausea and an appetite loss.  It has been getting better.  She feels tired, and she tends to be picky about what she eats.  She is unsure about her mood as everything was focused on the surgery.  She did not want to see anybody except her husband.  She found out the article about grief, and she would like to share with this Probation officer as it is exactly how she feels.  She talks about brain fog.  She agrees to try to express with her own words about her feeling.  She has difficulty in concentration.  She has challenges in reading magazines.  Although she tried coloring book after a long time, it was not detailed as before, and she lost interest in continuing it.  She also reports some hand tremors when she was working on it.  She has started to wake up at 4 AM, but has been able to go back.  Although it is difficult to assess as she used to be on opioid as well, she thinks medication is helping.  She denies SI.  She has not taken propranolol but again is unsure if this is due to the influence from other medication.  She would like to stay on the current medication regimen at this time.    157 lbs BP 125/89 3/27 Wt Readings from Last 3 Encounters:  10/03/22 162 lb (73.5 kg)  06/28/22 164 lb (74.4 kg)  06/22/22 162 lb (73.5 kg)     Employment:  Scientist, water quality, supporting president since 2016, healthcare IT for 30 years Support: husband Household: husband, puppy Marital status: married since 1994 Number of children: 2 grown sons (one from previous marriage, another one from her husband's previous marriage), 2 grandchildren (age 77,13)  Visit Diagnosis:    ICD-10-CM   1. Major depressive disorder, recurrent episode, moderate (HCC)  F33.1     2. Insomnia, unspecified type  G47.00     3. Binge eating disorder  F50.81       Past Psychiatric History: Please see initial evaluation for full details. I have reviewed the history. No updates at this time.     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   Claustrophobia    Complication of surgery [id number]   Overview:  Dilated pouch found on EGD done 12/2014    Depression    History of kidney stones    Hypertension    Hypoglycemia    Hypothyroidism    Inflammation of sacroiliac joint ([place]) 08/22/2014  Pneumonia 03/2020   PONV (postoperative nausea and vomiting)    NASUEATED/BAD HEADACHES   Seizures ([place]) 1985   x1, after labor/childbirth   Thyroid disease    Wears glasses     Past Surgical History:  Procedure Laterality Date   APPENDECTOMY  2010   BACK SURGERY  10/2005   lumbar   COLONOSCOPY WITH PROPOFOL N/A 09/23/2016   Procedure: COLONOSCOPY WITH PROPOFOL;  Surgeon: Lucilla Lame, MD;  Location: [facility];  Service: Endoscopy;  Laterality: N/A;   COLOSTOMY  10/22/2021   CYSTOSCOPY     x 1   EVALUATION UNDER ANESTHESIA WITH HEMORRHOIDECTOMY N/A 07/06/2020   Procedure: EXAM UNDER ANESTHESIA WITH HEMORRHOIDECTOMY;  Surgeon: Fredirick Maudlin, MD;  Location: [facility];  Service: General;  Laterality: N/A;   GALLBLADDER SURGERY  2009   lap    GASTRIC BYPASS  2008   roux en y   [place] N/A 12/10/2020   Procedure: MUCOSAL ADVANCEMENT FLAP;  Surgeon: Leighton Ruff, MD;  Location: [facility];  Service: General;  Laterality: N/A;  60 MIN   POLYPECTOMY  09/23/2016   Procedure: POLYPECTOMY;  Surgeon: Lucilla Lame, MD;  Location: [facility];  Service: Endoscopy;;   TOTAL VAGINAL HYSTERECTOMY  1999    Family Psychiatric History: Please see initial evaluation for full details. I have reviewed the history. No updates at this time.     Family History:  Family History  Problem Relation Age of Onset   Depression Mother    Hypertension Mother    Depression Father    Hypertension Father    Depression Sister    Breast cancer Sister 55   Hypertension Maternal Grandfather    Depression Maternal Grandmother    Hypertension Maternal Grandmother    Hypertension Paternal Grandfather    Hypertension Paternal Grandmother     Social History:  Social History   Socioeconomic History   Marital status: Married    Spouse name: Not on file   Number of children: Not on file   Years of education: Not on file   Highest education level: Not on file  Occupational History   Not on file  Tobacco Use   Smoking status: Never   Smokeless tobacco: Never  Vaping Use   Vaping Use: Never used  Substance and Sexual Activity   Alcohol use: Yes    Alcohol/week: 0.0 standard drinks of alcohol    Comment: RARE   Drug use: No   Sexual activity: Not Currently  Other Topics Concern   Not on file  Social History Narrative   Not on file   Social Determinants of Health   Financial Resource Strain: Low Risk  (06/22/2022)   Overall Financial Resource Strain (CARDIA)    Difficulty of Paying Living Expenses: Not hard at all  Food Insecurity: No Food Insecurity (06/22/2022)   Hunger Vital Sign    Worried About Running Out of Food in the Last Year: Never true    Ran Out of Food in the Last Year: Never true   Transportation Needs: No Transportation Needs (06/22/2022)   PRAPARE - Hydrologist (Medical): No    Lack of Transportation (Non-Medical): No  Physical Activity: Not on file  Stress: Not on file  Social Connections: Not on file    Allergies:  Allergies  Allergen Reactions   Losartan Palpitations    Could feel heartbeat (strongly) in neck   Penicillins Rash   Tape Rash  Skin irritation after back surgery white paper tape    Metabolic Disorder Labs: No results found for: "HGBA1C", "MPG" No results found for: "PROLACTIN" Lab Results  Component Value Date   CHOL 225 (H) 02/01/2022   TRIG 67 02/01/2022   HDL 119 02/01/2022   CHOLHDL 1.9 02/01/2022   LDLCALC 94 02/01/2022   LDLCALC 126 (H) 01/07/2021   Lab Results  Component Value Date   TSH 2.330 02/01/2022   TSH 1.360 01/07/2021    Therapeutic Level Labs: No results found for: "LITHIUM" No results found for: "VALPROATE" No results found for: "CBMZ"  Current Medications: Current Outpatient Medications  Medication Sig Dispense Refill   Ascorbic Acid (VITAMIN C) 1000 MG tablet Take 1,000 mg by mouth at bedtime.     Blood Glucose Monitoring Suppl (ONETOUCH VERIO REFLECT) w/Device KIT 1 each by Does not apply route daily at 6 (six) AM. 1 kit 0   [START ON 02/11/2023] busPIRone (BUSPAR) 7.5 MG tablet Take 1 tablet (7.5 mg total) by mouth 2 (two) times daily. 180 tablet 0   cetirizine (ZYRTEC) 10 MG tablet Take 10 mg by mouth daily.     Cholecalciferol (VITAMIN D-3) 125 MCG (5000 UT) TABS Take 5,000 Units by mouth at bedtime.     Cyanocobalamin (VITAMIN B-12) 2500 MCG SUBL Place 2,500 mcg under the tongue at bedtime.     desvenlafaxine (PRISTIQ) 100 MG 24 hr tablet Take 1 tablet (100 mg total) by mouth every morning. 90 tablet 1   desvenlafaxine (PRISTIQ) 100 MG 24 hr tablet Take 1 tablet (100 mg total) by mouth daily. 90 tablet 0   ferrous sulfate 325 (65 FE) MG tablet Take 325 mg by mouth at  bedtime.     fluticasone (FLONASE) 50 MCG/ACT nasal spray Place 2 sprays into both nostrils daily. 16 g 6   furosemide (LASIX) 20 MG tablet TAKE 1 TABLET DAILY (Patient taking differently: Take 20 mg by mouth daily as needed (fluid retention.).) 90 tablet 3   glucose blood test strip Use as instructed qid 100 each 12   ibuprofen (ADVIL) 800 MG tablet Take 1 tablet (800 mg total) by mouth every 8 (eight) hours as needed. 30 tablet 0   irbesartan (AVAPRO) 300 MG tablet TAKE 1 TABLET BY MOUTH DAILY (Patient taking differently: Take 150 mg by mouth daily.) 90 tablet 1   [START ON 01/22/2023] Lemborexant 5 MG TABS Take 1 tablet (5 mg total) by mouth at bedtime as needed. 30 tablet 1   levothyroxine (SYNTHROID) 75 MCG tablet TAKE 1 TABLET BY MOUTH DAILY  BEFORE BREAKFAST 90 tablet 1   magnesium oxide (MAG-OX) 400 MG tablet Take 400 mg by mouth at bedtime.     methocarbamol (ROBAXIN) 500 MG tablet 1/2-1 po qHS prn     OneTouch Delica Lancets [id number] MISC 1 each by Does not apply route 4 (four) times daily as needed. 100 each 12   prazosin (MINIPRESS) 2 MG capsule TAKE 1 CAPSULE BY MOUTH AT  BEDTIME 90 capsule 3   propranolol (INDERAL) 20 MG tablet Take 1 tablet (20 mg total) by mouth daily as needed. for anxiety 90 tablet 0   rOPINIRole (REQUIP) 4 MG tablet TAKE 1 TABLET BY MOUTH AT  BEDTIME 90 tablet 3   tamsulosin (FLOMAX) 0.4 MG CAPS capsule Take 1 capsule (0.4 mg total) by mouth daily as needed. (Patient taking differently: Take 0.4 mg by mouth daily as needed (Kidney stones).) 30 capsule 2   topiramate (TOPAMAX) 100 MG tablet  Take 2 tablets (200 mg total) by mouth at bedtime. 180 tablet 1   traMADol (ULTRAM) 50 MG tablet Take 25-50 mg by mouth 2 (two) times daily as needed (back pain.).      valACYclovir (VALTREX) 1000 MG tablet as needed.     No current facility-administered medications for this visit.     Musculoskeletal: Strength & Muscle Tone:  N/A Gait & Station:  N/A Patient leans:  N/A  Psychiatric Specialty Exam: Review of Systems  Psychiatric/Behavioral:  Positive for dysphoric mood. Negative for agitation, behavioral problems, confusion, decreased concentration, hallucinations, self-injury, sleep disturbance and suicidal ideas. The patient is nervous/anxious. The patient is not hyperactive.   All other systems reviewed and are negative.   There were no vitals taken for this visit.There is no height or weight on file to calculate BMI.  General Appearance: Fairly Groomed  Eye Contact:  Good  Speech:  Clear and Coherent  Volume:  Normal  Mood:   depressed  Affect:  Appropriate, Congruent, and calm  Thought Process:  Coherent  Orientation:  Full (Time, Place, and Person)  Thought Content: Logical   Suicidal Thoughts:  No  Homicidal Thoughts:  No  Memory:  Immediate;   Good  Judgement:  Good  Insight:  Good  Psychomotor Activity:  Normal  Concentration:  Concentration: Good and Attention Span: Good  Recall:  Good  Fund of Knowledge: Good  Language: Good  Akathisia:  No  Handed:  Right  AIMS (if indicated): not done  Assets:  Communication Skills Desire for Improvement  ADL's:  Intact  Cognition: WNL  Sleep:  Good   Screenings: GAD-7    Flowsheet Row Office Visit from 10/03/2022 in [facility] at [place] Visit from 06/28/2022 in [facility] Office Visit from 06/22/2022 in [facility] at [place] Visit from 05/26/2022 in [facility] Office Visit from 05/05/2022 in [facility]  Total GAD-7 Score 21 21 21 21 21       PHQ2-9    [place] Office Visit from 10/03/2022 in [facility] [place] Visit from 06/28/2022 in [facility] Office Visit from 06/22/2022 in  [facility] at [place] Visit from 05/26/2022 in [facility] Office Visit from 05/05/2022 in [facility]  PHQ-2 Total Score 6 6 6 6 6   PHQ-9 Total Score 22 24 21 24 21       [place] Office Visit from 06/28/2022 in [facility] Office Visit from 05/26/2022 in [facility] Office Visit from 05/05/2022 in [facility] No Risk No Risk No Risk        Assessment and Plan:  Toni Green is a 62 y.o. 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   1. Major depressive disorder, recurrent episode, moderate (HCC) Acute stressors include: conflict with her mother, who has medical condition, colostomy reversal on Feb 23  Other stressors  include: complication from surgery, unemployment, financial strain, loss of her sister in Nov 2022   History:   He continues to struggle with depressive symptoms, although anxiety is somehow improved in the context being on medication including opioid status post surgery.  Will continue current medication regimen at this time to assess accurately after she is out of the phase of recovery from surgery.  Will continue Pristiq to target depression.  Will continue BuSpar for anxiety.  Will continue propranolol as needed for anxiety.   2. Insomnia, unspecified type Overall improving, again under the influence of opioids.  Will continue lemborexant to target insomnia.  She is advised to hold  this medication if she experiences drowsiness.   3. Binge eating disorder She reports appetite loss status post surgery.  Will continue topiramate at this time for binge eating.  Will consider tapering off this medication if no improvement in her appetite.     # Alcohol use She previously reported ongoing alcohol use for anxiety, despite she does not like the taste of it.  She denies craving for alcohol.  Will continue motivational interview.    Plan  Continue Pristiq 100 mg daily  - optum, generic Continue Buspar 7.5 mg twice a day (limited benefit from uptitration) Continue propranolol 20 mg daily as needed for anxiety Continue topiramate 200 mg daily Continue lemborexant 5 mg at night as needed for sleep - [facility] Next appointment-  5/22 at 10 am, video She agrees to do in person visit at least every few months (she reports concern due to financial strain) - on tramadol - EKG QTc 394 msec on 11/2020 -Received a paperwork for disability.  She agrees to inquire the company whether the form needs to be done by this Probation officer given she is not employed anymore.  Will hold filling the paperwork at this time.    Past trials of medication- sertraline, lexapro, venlafaxine, bupropion (palpitation), Abilify, quetiapine (eating sweets), Geodon (increase in appetite). Ambien, Lunesta (severe drowsiness)     Collaboration of Care: Collaboration of Care: Other reviewed notes in Epic  Patient/Guardian was advised Release of Information must be obtained prior to any record release in order to collaborate their care with an outside provider. Patient/Guardian was advised if they have not already done so to contact the registration department to sign all necessary forms in order for [place] to release information regarding their care.   Consent: Patient/Guardian gives verbal consent for treatment and assignment of benefits for services provided during this visit. Patient/Guardian expressed understanding and agreed  to proceed.    Norman Clay, MD 01/11/2023, 12:09 PM

## 2023-01-11 ENCOUNTER — Telehealth (INDEPENDENT_AMBULATORY_CARE_PROVIDER_SITE_OTHER): Payer: 59 | Admitting: Psychiatry

## 2023-01-11 ENCOUNTER — Encounter: Payer: Self-pay | Admitting: Psychiatry

## 2023-01-11 DIAGNOSIS — F5081 Binge eating disorder: Secondary | ICD-10-CM

## 2023-01-11 DIAGNOSIS — F331 Major depressive disorder, recurrent, moderate: Secondary | ICD-10-CM | POA: Diagnosis not present

## 2023-01-11 DIAGNOSIS — G4700 Insomnia, unspecified: Secondary | ICD-10-CM

## 2023-01-11 MED ORDER — BUSPIRONE HCL 7.5 MG PO TABS: 7.5000 mg | ORAL_TABLET | Freq: Two times a day (BID) | ORAL | 0 refills | Status: DC

## 2023-01-11 MED ORDER — LEMBOREXANT 5 MG PO TABS: 5.0000 mg | ORAL_TABLET | Freq: Every evening | ORAL | 1 refills | Status: AC | PRN

## 2023-01-11 NOTE — Patient Instructions (Signed)
Continue Pristiq 100 mg daily   Continue Buspar 7.5 mg twice a day  Continue propranolol 20 mg daily as needed for anxiety Continue topiramate 200 mg daily Continue lemborexant 5 mg at night as needed for sleep  Next appointment-  5/22 at 10 am

## 2023-01-16 ENCOUNTER — Ambulatory Visit (INDEPENDENT_AMBULATORY_CARE_PROVIDER_SITE_OTHER): Payer: 59 | Admitting: Clinical

## 2023-01-16 DIAGNOSIS — F331 Major depressive disorder, recurrent, moderate: Secondary | ICD-10-CM

## 2023-01-16 NOTE — Progress Notes (Signed)
                Jamelah Sitzer, LCSW 

## 2023-01-16 NOTE — Progress Notes (Addendum)
[facility] Counselor/Therapist Progress Note  Patient ID: MALAJAH KADRMAS, MRN: [id number],    Date: 01/16/2023  Time Spent: 2:31pm - 3:27pm : 56 minutes  Treatment Type: Individual Therapy  Reported Symptoms: Patient reported experiencing nausea, weakness, and fatigue at times since recent surgery.   Mental Status Exam: Appearance:  Well Groomed     Behavior: Appropriate  Motor: Normal  Speech/Language:  Clear and Coherent  Affect: Appropriate  Mood: normal  Thought process: normal  Thought content:   WNL  Sensory/Perceptual disturbances:   WNL  Orientation: oriented to person, place, and situation  Attention: Good  Concentration: Good  Memory: WNL  Fund of knowledge:  Good  Insight:   Good  Judgment:  Good  Impulse Control: Good   Risk Assessment: Danger to Self:  No  Patient denied current suicidal ideation  Self-injurious Behavior: No Danger to Others: No Patient denied current homicidal ideation  Duty to Warn:no Physical Aggression / Violence:No  Access to Firearms a concern: No  Gang Involvement:No   Subjective: Patient reported she was recently in the hospital for 9 days as a result of surgery for a colostomy reversal, hernia repair, and removal of patient's ovaries. Patient reported recent weight loss of 19 lbs since the day she was released from the hospital. Patient reported she experienced severe nausea after the surgery which lengthened her hospital stay. Patient reported she continues to experience nausea at times, feeling weak, and fatigue. Patient stated, "I'm happy I'm seeing improvement and feel like Im on the right track" in response to the surgery. Patient stated, "I'm looking forward to feeling better". Patient reported she also had an eye surgery last week. Patient reported she is happy to have the eye surgery completed. Patient stated, "I can't even tell you what I thought about" since the surgery. Patient reported she has started putting  herself first and reported not feeling guilty in response to her mother's requests. Patient stated, "I'm relieved for not feeling guilty about it" in response to patient's relationship with her mother. Patient stated, "I haven't thought much about my mood" in response to mood since last session.   Interventions: Cognitive Behavioral Therapy. Clinician conducted session via WebEx video from clinician's home office. Patient provided verbal consent to proceed with telehealth session and participated in session from patient's home. Reviewed events since last session. Provided supportive therapy, active and reflective listening, and validation as patient discussed recent surgery/hospitalization and patient's response to the surgery. Discussed the importance of patient performing self care and prioritizing her physical and mental health as she recovers from surgery. Discussed patient's thoughts/feelings regarding her relationship with her mother and recent changes in her thoughts/feelings in response and maintaining healthy boundaries. Assessed patient's mood.   Diagnosis:  Major Depressive Disorder, recurrent, moderate R/O Generalized Anxiety Disorder R/O Social Anxiety Disorder   Treatment Plan: Patient is to utilize Public relations account executive Therapy, Dialectical Behavior Therapy Skills, and coping strategies to help decrease symptoms associated with Anxiety and Major Depressive Disorder.   Frequency: Weekly  Modality: individual therapy    Long-term goal:   Patient stated, "I would like to be my old confident self again" as evidence by decrease in hands shaking, knees shaking, voice quivering, and feelings of anxiety from every time there is a social interaction to 0 times when there is a social interaction   Target Date: 06/17/23  Progress: progressing    Decrease in depressive symptoms as evidence by decrease in crying, loss of interest, lack of energy,  difficulty falling asleep/staying asleep from 6 to  7 days per week to 0 to 1 days per week for duration of 6 months to 1 year   Target Date: 06/17/23  Progress: progressing    Short-term goal:  Develop understanding of the relationship between loss of interest in activities to depression and anxiety and the impact on patient's thought patterns and behaviors   Target Date: 06/17/23  Progress: progressing    Develop understanding between patient's thoughts and feelings, such as, automatic negative thoughts, core beliefs, and worry on patient's level of confidence.    Target Date: 06/17/23  Progress: progressing     Doree Barthel, LCSW

## 2023-01-17 ENCOUNTER — Telehealth: Payer: Self-pay | Admitting: Psychiatry

## 2023-01-17 NOTE — Telephone Encounter (Signed)
Received a form from [employer]. I advised her to contact us if the form needs to be completed, as she is no longer employed. Please inquire with her about what needs to be done.

## 2023-01-18 NOTE — Telephone Encounter (Signed)
pt states she needs you to complete the form. she is still not able to work.

## 2023-01-19 NOTE — Telephone Encounter (Signed)
The paperwork has been completed and was handed over to the nurse for faxing. 

## 2023-01-20 ENCOUNTER — Telehealth: Payer: Self-pay

## 2023-01-20 NOTE — Telephone Encounter (Signed)
faxed and confirmed paperwork was sent.  Paperwork was put in scan basket

## 2023-01-29 ENCOUNTER — Other Ambulatory Visit: Payer: Self-pay | Admitting: Psychiatry

## 2023-02-03 ENCOUNTER — Ambulatory Visit: Payer: 59 | Admitting: Clinical

## 2023-02-24 ENCOUNTER — Ambulatory Visit: Payer: 59 | Admitting: Clinical

## 2023-03-01 ENCOUNTER — Telehealth (INDEPENDENT_AMBULATORY_CARE_PROVIDER_SITE_OTHER): Payer: 59 | Admitting: Psychiatry

## 2023-03-01 ENCOUNTER — Encounter: Payer: Self-pay | Admitting: Psychiatry

## 2023-03-01 DIAGNOSIS — F331 Major depressive disorder, recurrent, moderate: Secondary | ICD-10-CM | POA: Diagnosis not present

## 2023-03-01 DIAGNOSIS — F5081 Binge eating disorder: Secondary | ICD-10-CM | POA: Diagnosis not present

## 2023-03-01 DIAGNOSIS — G4700 Insomnia, unspecified: Secondary | ICD-10-CM

## 2023-03-01 NOTE — Progress Notes (Signed)
Virtual Visit via Video Note  I connected with Toni Green on 03/01/23 at  9:30 AM EDT by a video enabled telemedicine application and verified that I am speaking with the correct person using two identifiers.  Location: Patient: home Provider: office Persons participated in the visit- patient, provider    I discussed the limitations of evaluation and management by telemedicine and the availability of in person appointments. The patient expressed understanding and agreed to proceed.    I discussed the assessment and treatment plan with the patient. The patient was provided an opportunity to ask questions and all were answered. The patient agreed with the plan and demonstrated an understanding of the instructions.   The patient was advised to call back or seek an in-person evaluation if the symptoms worsen or if the condition fails to improve as anticipated.  I provided 26 minutes of non-face-to-face time during this encounter.   [name], MD      [facility] MD/PA/NP OP Progress Note  03/01/2023 12:02 PM Toni Green  MRN:  [id number]  Chief Complaint:  Chief Complaint  Patient presents with   Follow-up   HPI:  This is a follow-up appointment for depression, anxiety and insomnia.  She states that she has been feeling sick since she had a colostomy in February.  She was told that she has inflamed intestine, and has vibrio. She takes doxycyline for this condition.  She needs to go to the bathroom as she has diarrhea and vomiting.  She feels exhausted.  She has not been able to work on gardening as much due to feeling fatigue.  She thinks about her sister, and her nephew will have a wedding, which is a Agricultural engineer. She feels depressed and anxious.  She continues to take metoprolol.  She had an episode of feeling fainted the other time.  She has agreed to stay hydrated and contact her provider about her condition.  She has insomnia.  She denies SI.  When she is advised to consider  lowering the dose of topiramate, she states that she feels petrified as she is concerned of getting back her weight she has lost since gastric surgery.  Although she wants to eat and healthy, she is concerned about this. She agrees with the plan as below.   Substance use  Tobacco Alcohol Other substances/  Current denies 6 pack on weekend CBD gummies at times  Past denies    Past Treatment        Visit Diagnosis:    ICD-10-CM   1. Major depressive disorder, recurrent episode, moderate (HCC)  F33.1     2. Insomnia, unspecified type  G47.00     3. Binge eating disorder  F50.81       Past Psychiatric History: Please see initial evaluation for full details. I have reviewed the history. No updates at this time.     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   Claustrophobia    Complication of surgery 02/20/2015   Overview:  Dilated pouch found on EGD done 12/2014    Depression    History of kidney stones    Hypertension    Hypoglycemia    Hypothyroidism  Inflammation of sacroiliac joint (HCC) 08/22/2014   Pneumonia 03/2020   PONV (postoperative nausea and vomiting)    NASUEATED/BAD HEADACHES   Seizures (HCC) 1985   x1, after labor/childbirth   Thyroid disease    Wears glasses     Past Surgical History:  Procedure Laterality Date   APPENDECTOMY  2010   BACK SURGERY  10/2005   lumbar   COLONOSCOPY WITH PROPOFOL N/A 09/23/2016   Procedure: COLONOSCOPY WITH PROPOFOL;  Surgeon: Midge Minium, MD;  [facility]: [facility];  Service: Endoscopy;  Laterality: N/A;   COLOSTOMY  10/22/2021   CYSTOSCOPY     x 1   EVALUATION UNDER ANESTHESIA WITH HEMORRHOIDECTOMY N/A 07/06/2020   Procedure: EXAM UNDER ANESTHESIA WITH HEMORRHOIDECTOMY;  Surgeon: Duanne Guess, MD;  Location: [facility];  Service: General;  Laterality: N/A;   GALLBLADDER SURGERY  2009   lap   GASTRIC BYPASS  2008   roux en y   MUCOSAL ADVANCEMENT FLAP N/A 12/10/2020   Procedure: MUCOSAL ADVANCEMENT FLAP;  Surgeon: Romie Levee, MD;  Location: [facility];  Service: General;  Laterality: N/A;  60 MIN   POLYPECTOMY  09/23/2016   Procedure: POLYPECTOMY;  Surgeon: Midge Minium, MD;  Location: [facility];  Service: Endoscopy;;   TOTAL VAGINAL HYSTERECTOMY  1999    Family Psychiatric History: Please see initial evaluation for full details. I have reviewed the history. No updates at this time.     Family History:  Family History  Problem Relation Age of Onset   Depression Mother    Hypertension Mother    Depression Father    Hypertension Father    Depression Sister    Breast cancer Sister 76   Hypertension Maternal Grandfather    Depression Maternal Grandmother    Hypertension Maternal Grandmother    Hypertension Paternal Grandfather    Hypertension Paternal Grandmother     Social History:  Social History   Socioeconomic History   Marital status: Married    Spouse name: Not on file   Number of children: Not on file   Years of education: Not on file   Highest education level: Not on file  Occupational History   Not on file  Tobacco Use   Smoking status: Never   Smokeless tobacco: Never  Vaping Use   Vaping Use: Never used  Substance and Sexual Activity   Alcohol use: Yes    Alcohol/week: 0.0 standard drinks of alcohol    Comment: RARE   Drug use: No   Sexual activity: Not Currently  Other Topics Concern   Not on file  Social History Narrative   Not on file   Social Determinants of Health   Financial Resource Strain: Low Risk  (06/22/2022)   Overall Financial Resource Strain (CARDIA)    Difficulty of Paying Living Expenses: Not hard at all  Food Insecurity: No Food Insecurity (06/22/2022)   Hunger Vital Sign    Worried  About Running Out of Food in the Last Year: Never true    Ran Out of Food in the Last Year: Never true  Transportation Needs: No Transportation Needs (06/22/2022)   PRAPARE - Administrator, Civil Service (Medical): No    Lack of Transportation (Non-Medical): No  Physical Activity: Not on file  Stress: Not on file  Social Connections: Not on file    Allergies:  Allergies  Allergen Reactions   Losartan Palpitations    Could feel heartbeat (strongly) in neck   Penicillins  Rash   Tape Rash    Skin irritation after back surgery white paper tape    Metabolic Disorder Labs: No results found for: "HGBA1C", "MPG" No results found for: "PROLACTIN" Lab Results  Component Value Date   CHOL 225 (H) 02/01/2022   TRIG 67 02/01/2022   HDL 119 02/01/2022   CHOLHDL 1.9 02/01/2022   LDLCALC 94 02/01/2022   LDLCALC 126 (H) 01/07/2021   Lab Results  Component Value Date   TSH 2.330 02/01/2022   TSH 1.360 01/07/2021    Therapeutic Level Labs: No results found for: "LITHIUM" No results found for: "VALPROATE" No results found for: "CBMZ"  Current Medications: Current Outpatient Medications  Medication Sig Dispense Refill   Ascorbic Acid (VITAMIN C) 1000 MG tablet Take 1,000 mg by mouth at bedtime.     Blood Glucose Monitoring Suppl (ONETOUCH VERIO REFLECT) w/Device KIT 1 each by Does not apply route daily at 6 (six) AM. 1 kit 0   busPIRone (BUSPAR) 7.5 MG tablet Take 1 tablet (7.5 mg total) by mouth 2 (two) times daily. 180 tablet 0   cetirizine (ZYRTEC) 10 MG tablet Take 10 mg by mouth daily.     Cholecalciferol (VITAMIN D-3) 125 MCG (5000 UT) TABS Take 5,000 Units by mouth at bedtime.     Cyanocobalamin (VITAMIN B-12) 2500 MCG SUBL Place 2,500 mcg under the tongue at bedtime.     desvenlafaxine (PRISTIQ) 100 MG 24 hr tablet Take 1 tablet (100 mg total) by mouth every morning. 90 tablet 1   desvenlafaxine (PRISTIQ) 100 MG 24 hr tablet Take 1 tablet (100 mg total) by mouth  daily. 90 tablet 0   ferrous sulfate 325 (65 FE) MG tablet Take 325 mg by mouth at bedtime.     fluticasone (FLONASE) 50 MCG/ACT nasal spray Place 2 sprays into both nostrils daily. 16 g 6   furosemide (LASIX) 20 MG tablet TAKE 1 TABLET DAILY (Patient taking differently: Take 20 mg by mouth daily as needed (fluid retention.).) 90 tablet 3   glucose blood test strip Use as instructed qid 100 each 12   ibuprofen (ADVIL) 800 MG tablet Take 1 tablet (800 mg total) by mouth every 8 (eight) hours as needed. 30 tablet 0   irbesartan (AVAPRO) 300 MG tablet TAKE 1 TABLET BY MOUTH DAILY (Patient taking differently: Take 150 mg by mouth daily.) 90 tablet 1   Lemborexant 5 MG TABS Take 1 tablet (5 mg total) by mouth at bedtime as needed. 30 tablet 1   levothyroxine (SYNTHROID) 75 MCG tablet TAKE 1 TABLET BY MOUTH DAILY  BEFORE BREAKFAST 90 tablet 1   magnesium oxide (MAG-OX) 400 MG tablet Take 400 mg by mouth at bedtime.     methocarbamol (ROBAXIN) 500 MG tablet 1/2-1 po qHS prn     OneTouch Delica Lancets 30G MISC 1 each by Does not apply route 4 (four) times daily as needed. 100 each 12   prazosin (MINIPRESS) 2 MG capsule TAKE 1 CAPSULE BY MOUTH AT  BEDTIME 90 capsule 3   propranolol (INDERAL) 20 MG tablet Take 1 tablet (20 mg total) by mouth daily as needed. for anxiety 90 tablet 0   rOPINIRole (REQUIP) 4 MG tablet TAKE 1 TABLET BY MOUTH AT  BEDTIME 90 tablet 3   tamsulosin (FLOMAX) 0.4 MG CAPS capsule Take 1 capsule (0.4 mg total) by mouth daily as needed. (Patient taking differently: Take 0.4 mg by mouth daily as needed (Kidney stones).) 30 capsule 2   topiramate (TOPAMAX) 100  MG tablet Take 2 tablets (200 mg total) by mouth at bedtime. 180 tablet 1   traMADol (ULTRAM) 50 MG tablet Take 25-50 mg by mouth 2 (two) times daily as needed (back pain.).      valACYclovir (VALTREX) 1000 MG tablet as needed.     No current facility-administered medications for this visit.     Musculoskeletal: Strength &  Muscle Tone:  N/A Gait & Station:  N/A Patient leans: N/A  Psychiatric Specialty Exam: Review of Systems  Psychiatric/Behavioral:  Positive for dysphoric mood and sleep disturbance. Negative for agitation, behavioral problems, confusion, decreased concentration, hallucinations, self-injury and suicidal ideas. The patient is nervous/anxious. The patient is not hyperactive.   All other systems reviewed and are negative.   There were no vitals taken for this visit.There is no height or weight on file to calculate BMI.  General Appearance: Fairly Groomed  Eye Contact:  Good  Speech:  Clear and Coherent  Volume:  Normal  Mood:   not good  Affect:  Appropriate, Congruent, and slightly restricted  Thought Process:  Coherent and Goal Directed  Orientation:  Full (Time, Place, and Person)  Thought Content: Logical   Suicidal Thoughts:  No  Homicidal Thoughts:  No  Memory:  Immediate;   Good  Judgement:  Good  Insight:  Good  Psychomotor Activity:  Normal  Concentration:  Concentration: Good and Attention Span: Good  Recall:  Good  Fund of Knowledge: Good  Language: Good  Akathisia:  No  Handed:  Right  AIMS (if indicated): not done  Assets:  Communication Skills Desire for Improvement  ADL's:  Intact  Cognition: WNL  Sleep:  Poor   Screenings: GAD-7    Flowsheet Row Office Visit from 10/03/2022 in [facility] at [facility] Office Visit from 06/28/2022 in [facility] Office Visit from 06/22/2022 in [facility] at [facility] Office Visit from 05/26/2022 in [facility] Office Visit from 05/05/2022 in [facility]  Total GAD-7 Score 21 21 21 21 21       PHQ2-9    Flowsheet Row Office Visit from 10/03/2022 in [facility] at [facility] Office Visit from  06/28/2022 in [facility] Office Visit from 06/22/2022 in [facility] at [facility] Office Visit from 05/26/2022 in [facility] Office Visit from 05/05/2022 in [facility]  PHQ-2 Total Score 6 6 6 6 6   PHQ-9 Total Score 22 24 21 24 21       Flowsheet Row Office Visit from 06/28/2022 in [facility] Office Visit from 05/26/2022 in [facility] Office Visit from 05/05/2022 in [facility]  C-SSRS RISK CATEGORY No Risk No Risk No Risk        Assessment and Plan:  Toni Green is a 62 y.o. 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   1. Major depressive disorder, recurrent episode, moderate (HCC) Acute stressors include: conflict with her mother, who has medical condition,  GI symptoms s/p colostomy reversal on Feb 23  Other stressors include: complication from surgery, unemployment, financial strain, loss of her sister in Nov 2022   History:   She continues to struggle with depressive symptoms in the context of stressors as above.  Will continue Pristiq and buspirone to target depression/anxiety at this time, although both of the medication might need to be adjusted/reduced if she continues to have appetite loss.  She is advised to hold the propranolol to avoid dizziness.   2. Insomnia, unspecified type Worsening in the context of stressors as above.  She was advised to hold lemborexant if she experiences  any drowsiness.   # Binge eating disorder Although she was strongly encouraged to taper down the topiramate given her current GI symptoms, she has fear of gaining weight in the future despite her current appetite loss.  She does not have distorted body image on today's evaluation, and would like to eat to feel better.  Will continue current dose at this time and will continue to discuss if appropriate.   # Alcohol use There is slight worsening in alcohol use on weekend.  She is advised to cut down alcohol use especially given her current GI symptoms/risk of dehydration.  Discussed pharmacological treatment for alcohol abstinence if she is interested.      Plan  Continue Pristiq 100 mg daily  - optum, generic Continue Buspar 7.5 mg twice a day (limited benefit from uptitration) Continue propranolol 20 mg daily as needed for anxiety Continue topiramate 200 mg daily Continue lemborexant 5 mg at night as needed for sleep - [facility] Next appointment-  6/25 at 2 30, video She agrees to do in person visit at least every few months (she reports concern due to financial strain) - on tramadol - EKG QTc 394 msec on 11/2020   Past trials of medication- sertraline, lexapro, venlafaxine, bupropion (palpitation), Abilify, quetiapine (eating sweets), Geodon (increase in appetite). Ambien, Lunesta (severe drowsiness)    Collaboration of Care: Collaboration of Care: Other reviewed notes in Epic  Patient/Guardian was advised Release of Information must be obtained prior to any record release in order to collaborate their care with an outside provider. Patient/Guardian was advised if they have not already done so to contact the registration department to sign all necessary forms in order for [place] to release information regarding their care.   Consent: Patient/Guardian gives verbal consent for treatment and assignment of benefits for services provided during this visit. Patient/Guardian expressed understanding and  agreed to proceed.    [name], MD 03/01/2023, 12:02 PM

## 2023-03-08 ENCOUNTER — Telehealth: Payer: 59 | Admitting: Psychiatry

## 2023-03-09 ENCOUNTER — Other Ambulatory Visit: Payer: Self-pay | Admitting: Psychiatry

## 2023-03-09 DIAGNOSIS — F5081 Binge eating disorder: Secondary | ICD-10-CM

## 2023-03-15 ENCOUNTER — Other Ambulatory Visit: Payer: Self-pay | Admitting: Psychiatry

## 2023-03-23 ENCOUNTER — Ambulatory Visit: Payer: 59 | Admitting: Clinical

## 2023-03-24 ENCOUNTER — Ambulatory Visit (INDEPENDENT_AMBULATORY_CARE_PROVIDER_SITE_OTHER): Payer: 59 | Admitting: Clinical

## 2023-03-24 DIAGNOSIS — F331 Major depressive disorder, recurrent, moderate: Secondary | ICD-10-CM

## 2023-03-24 NOTE — Progress Notes (Signed)
[facility] Counselor/Therapist Progress Note  [name], MRN: [id number],    Date: 03/24/2023  Time Spent: 9:32am - 10:35am : 62 minutes   Treatment Type: Individual Therapy  Reported Symptoms: Patient reported decreased energy and difficulty sleeping.   Mental Status Exam: Appearance:  Neat and Well Groomed     Behavior: Appropriate  Motor: Normal  Speech/Language:  Clear and Coherent  Affect: Tearful  Mood: sad  Thought process: normal  Thought content:   WNL  Sensory/Perceptual disturbances:   WNL  Orientation: oriented to person, place, and situation  Attention: Good  Concentration: Good  Memory: WNL  Fund of knowledge:  Good  Insight:   Good  Judgment:  Good  Impulse Control: Good   Risk Assessment: Danger to Self:  No Patient denied current suicidal ideation  Self-injurious Behavior: No Danger to Others: No Patient denied current homicidal ideation Duty to Warn:no Physical Aggression / Violence:No  Access to Firearms a concern: No  Gang Involvement:No   Subjective: Patient reported she has been experiencing nausea, vomiting, and diarrhea. Patient reported recent blood work indicated patient has vibrio. Patient stated, "my gut is out of wack". Patient reported she is scheduled to have a biopsy of her small intestines. Patient reported decreased energy and difficulty sleeping. Patient reported she researched the symptoms she is experiencing on the internet and reported she "panicked" in response. Patient stated, "I'm worried" in regards to current health concerns. Patient reported her sister's birthday was this past Monday and patient stated, "I've had some meltdowns". Patient stated "I feel like I should be having less meltdowns" and stated, "Its not getting easier" in response to the loss of her sister. Patient stated, "I cry about every other day".  Patient reported she recently entered a picture of herself with her sister's dog into a  breast cancer fundraiser and was required to share patient's/sister's story. Patient stated, "I'm kind of glad that worked out" and stated, "that made me happy".  Patient reported the loss of her sister has made patient question her faith. During session, patient shared some positive memories of her sister.   Interventions:  Supportive therapy and psycho education.   Clinician conducted session via caregility video from clinician's home office. Patient provided verbal consent to proceed with telehealth session and participated in session from patient's home. Reviewed events since last session. Provided supportive therapy, active and reflective listening, and validation as patient discussed recent health concerns and patient's sister's birthday. Provided psycho education related to grief. Discussed patient starting a grief journal. Discussed positive opportunities for patient to honor her sister's memory.    Collaboration of Care: Other not required at this time  Diagnosis:  Major Depressive Disorder, recurrent, moderate R/O Generalized Anxiety Disorder R/O Social Anxiety Disorder   Treatment Plan: Patient is to utilize Engineer, manufacturing systems Therapy, Dialectical Behavior Therapy Skills, and coping strategies to help decrease symptoms associated with Anxiety and Major Depressive Disorder.   Frequency: Weekly  Modality: individual therapy    Long-term goal:   Patient stated, "I would like to be my old confident self again" as evidence by decrease in hands shaking, knees shaking, voice quivering, and feelings of anxiety from every time there is a social interaction to 0 times when there is a social interaction   Target Date: 06/17/23  Progress: progressing    Decrease in depressive symptoms as evidence by decrease in crying, loss of interest, lack of energy, difficulty falling asleep/staying asleep from 6 to 7 days  per week to 0 to 1 days per week for duration of 6 months to 1 year   Target Date:  06/17/23  Progress: progressing    Short-term goal:  Develop understanding of the relationship between loss of interest in activities to depression and anxiety and the impact on patient's thought patterns and behaviors   Target Date: 06/17/23  Progress: progressing    Develop understanding between patient's thoughts and feelings, such as, automatic negative thoughts, core beliefs, and worry on patient's level of confidence.    Target Date: 06/17/23  Progress: progressing    Doree Barthel, LCSW

## 2023-03-24 NOTE — Progress Notes (Signed)
                Santia Labate, LCSW 

## 2023-03-31 ENCOUNTER — Other Ambulatory Visit: Payer: Self-pay | Admitting: Psychiatry

## 2023-04-03 ENCOUNTER — Other Ambulatory Visit: Payer: Self-pay | Admitting: Psychiatry

## 2023-04-03 ENCOUNTER — Telehealth: Payer: Self-pay

## 2023-04-03 MED ORDER — LEMBOREXANT 5 MG PO TABS: 5.0000 mg | ORAL_TABLET | Freq: Every evening | ORAL | 0 refills | Status: AC | PRN

## 2023-04-03 NOTE — Telephone Encounter (Signed)
ordered

## 2023-04-03 NOTE — Telephone Encounter (Signed)
received fax requesting a refill on the dayvigo 5mg  . pt last seen on05-15 next appt 6-25

## 2023-04-04 NOTE — Telephone Encounter (Signed)
Pt.notified

## 2023-04-04 NOTE — Progress Notes (Deleted)
BH MD/PA/NP OP Progress Note  04/04/2023 8:06 AM Toni Green  MRN:  [id number]  Chief Complaint: No chief complaint on file.  HPI: ***fodmap diet  Visit Diagnosis: No diagnosis found.  Past Psychiatric History: Please see initial evaluation for full details. I have reviewed the history. No updates at this time.     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   Claustrophobia    Complication of surgery 02/20/2015   Overview:  Dilated pouch found on EGD done 12/2014    Depression    History of kidney stones    Hypertension    Hypoglycemia    Hypothyroidism    Inflammation of sacroiliac joint (HCC) 08/22/2014   Pneumonia 03/2020   PONV (postoperative nausea and vomiting)    NASUEATED/BAD HEADACHES   Seizures (HCC) 1985   x1, after labor/childbirth   Thyroid disease    Wears glasses     Past Surgical History:  Procedure Laterality Date   APPENDECTOMY  2010   BACK SURGERY  10/2005   lumbar   COLONOSCOPY WITH PROPOFOL N/A 09/23/2016   Procedure: COLONOSCOPY WITH PROPOFOL;  Surgeon: Midge Minium, MD;  Location: [facility];  Service: Endoscopy;  Laterality: N/A;   COLOSTOMY  10/22/2021   CYSTOSCOPY     x 1   EVALUATION UNDER ANESTHESIA WITH HEMORRHOIDECTOMY N/A 07/06/2020   Procedure: EXAM UNDER ANESTHESIA WITH HEMORRHOIDECTOMY;  Surgeon: Duanne Guess, MD;  Location: [facility];  Service: General;  Laterality: N/A;   GALLBLADDER SURGERY  2009   lap   GASTRIC BYPASS  2008   roux en y   MUCOSAL ADVANCEMENT FLAP N/A 12/10/2020   Procedure: MUCOSAL ADVANCEMENT FLAP;  Surgeon: Romie Levee, MD;  Location: [facility];  Service: General;  Laterality: N/A;  60 MIN   POLYPECTOMY  09/23/2016   Procedure: POLYPECTOMY;  Surgeon: Midge Minium, MD;  Location: [facility];  Service: Endoscopy;;   TOTAL VAGINAL HYSTERECTOMY  1999    Family Psychiatric History: Please see initial evaluation for full details. I have reviewed the history. No updates at this time.     Family History:  Family History  Problem Relation Age of Onset   Depression Mother    Hypertension Mother    Depression Father    Hypertension Father    Depression Sister    Breast cancer Sister 72   Hypertension Maternal Grandfather    Depression Maternal Grandmother    Hypertension Maternal Grandmother    Hypertension Paternal Grandfather    Hypertension Paternal Grandmother     Social History:  Social History   Socioeconomic History   Marital status: Married    Spouse name: Not on file   Number of children: Not on file   Years of education: Not on file   Highest education level: Not on file  Occupational History   Not on file  Tobacco Use   Smoking status: Never   Smokeless tobacco: Never  Vaping Use   Vaping Use: Never used  Substance and Sexual Activity   Alcohol use: Yes    Alcohol/week: 0.0 standard drinks of alcohol    Comment: RARE   Drug use: No   Sexual activity: Not  Currently  Other Topics Concern   Not on file  Social History Narrative   Not on file   Social Determinants of Health   Financial Resource Strain: Low Risk  (06/22/2022)   Overall Financial Resource Strain (CARDIA)    Difficulty of Paying Living Expenses: Not hard at all  Food Insecurity: No Food Insecurity (06/22/2022)   Hunger Vital Sign    Worried About Running Out of Food in the Last Year: Never true    Ran Out of Food in the Last Year: Never true  Transportation Needs: No Transportation Needs (06/22/2022)   PRAPARE - Administrator, Civil Service (Medical): No    Lack of Transportation (Non-Medical): No  Physical Activity: Not on file  Stress: Not on file  Social Connections: Not on file    Allergies:  Allergies  Allergen Reactions    Losartan Palpitations    Could feel heartbeat (strongly) in neck   Penicillins Rash   Tape Rash    Skin irritation after back surgery white paper tape    Metabolic Disorder Labs: No results found for: "HGBA1C", "MPG" No results found for: "PROLACTIN" Lab Results  Component Value Date   CHOL 225 (H) 02/01/2022   TRIG 67 02/01/2022   HDL 119 02/01/2022   CHOLHDL 1.9 02/01/2022   LDLCALC 94 02/01/2022   LDLCALC 126 (H) 01/07/2021   Lab Results  Component Value Date   TSH 2.330 02/01/2022   TSH 1.360 01/07/2021    Therapeutic Level Labs: No results found for: "LITHIUM" No results found for: "VALPROATE" No results found for: "CBMZ"  Current Medications: Current Outpatient Medications  Medication Sig Dispense Refill   Ascorbic Acid (VITAMIN C) 1000 MG tablet Take 1,000 mg by mouth at bedtime.     Blood Glucose Monitoring Suppl (ONETOUCH VERIO REFLECT) w/Device KIT 1 each by Does not apply route daily at 6 (six) AM. 1 kit 0   busPIRone (BUSPAR) 7.5 MG tablet Take 1 tablet (7.5 mg total) by mouth 2 (two) times daily. 180 tablet 0   cetirizine (ZYRTEC) 10 MG tablet Take 10 mg by mouth daily.     Cholecalciferol (VITAMIN D-3) 125 MCG (5000 UT) TABS Take 5,000 Units by mouth at bedtime.     Cyanocobalamin (VITAMIN B-12) 2500 MCG SUBL Place 2,500 mcg under the tongue at bedtime.     desvenlafaxine (PRISTIQ) 100 MG 24 hr tablet Take 1 tablet (100 mg total) by mouth every morning. 90 tablet 1   desvenlafaxine (PRISTIQ) 100 MG 24 hr tablet Take 1 tablet (100 mg total) by mouth daily. 90 tablet 0   ferrous sulfate 325 (65 FE) MG tablet Take 325 mg by mouth at bedtime.     fluticasone (FLONASE) 50 MCG/ACT nasal spray Place 2 sprays into both nostrils daily. 16 g 6   furosemide (LASIX) 20 MG tablet TAKE 1 TABLET DAILY (Patient taking differently: Take 20 mg by mouth daily as needed (fluid retention.).) 90 tablet 3   glucose blood test strip Use as instructed qid 100 each 12   ibuprofen  (ADVIL) 800 MG tablet Take 1 tablet (800 mg total) by mouth every 8 (eight) hours as needed. 30 tablet 0   irbesartan (AVAPRO) 300 MG tablet TAKE 1 TABLET BY MOUTH DAILY (Patient taking differently: Take 150 mg by mouth daily.) 90 tablet 1   Lemborexant 5 MG TABS Take 1 tablet (5 mg total) by mouth at bedtime as needed. 30 tablet 0   levothyroxine (SYNTHROID) 75 MCG tablet  TAKE 1 TABLET BY MOUTH DAILY  BEFORE BREAKFAST 90 tablet 1   magnesium oxide (MAG-OX) 400 MG tablet Take 400 mg by mouth at bedtime.     methocarbamol (ROBAXIN) 500 MG tablet 1/2-1 po qHS prn     OneTouch Delica Lancets 30G MISC 1 each by Does not apply route 4 (four) times daily as needed. 100 each 12   prazosin (MINIPRESS) 2 MG capsule TAKE 1 CAPSULE BY MOUTH AT  BEDTIME 90 capsule 3   propranolol (INDERAL) 20 MG tablet Take 1 tablet (20 mg total) by mouth daily as needed. for anxiety 90 tablet 0   rOPINIRole (REQUIP) 4 MG tablet TAKE 1 TABLET BY MOUTH AT  BEDTIME 90 tablet 3   tamsulosin (FLOMAX) 0.4 MG CAPS capsule Take 1 capsule (0.4 mg total) by mouth daily as needed. (Patient taking differently: Take 0.4 mg by mouth daily as needed (Kidney stones).) 30 capsule 2   topiramate (TOPAMAX) 100 MG tablet Take 2 tablets (200 mg total) by mouth at bedtime. 180 tablet 1   traMADol (ULTRAM) 50 MG tablet Take 25-50 mg by mouth 2 (two) times daily as needed (back pain.).      valACYclovir (VALTREX) 1000 MG tablet as needed.     No current facility-administered medications for this visit.     Musculoskeletal: Strength & Muscle Tone:  N?A Gait & Station:  N/A Patient leans: N/A  Psychiatric Specialty Exam: Review of Systems  There were no vitals taken for this visit.There is no height or weight on file to calculate BMI.  General Appearance: {Appearance:22683}  Eye Contact:  {BHH EYE CONTACT:22684}  Speech:  Clear and Coherent  Volume:  Normal  Mood:  {BHH MOOD:22306}  Affect:  {Affect (PAA):22687}  Thought Process:   Coherent  Orientation:  Full (Time, Place, and Person)  Thought Content: Logical   Suicidal Thoughts:  {ST/HT (PAA):22692}  Homicidal Thoughts:  {ST/HT (PAA):22692}  Memory:  Immediate;   Good  Judgement:  {Judgement (PAA):22694}  Insight:  {Insight (PAA):22695}  Psychomotor Activity:  Normal  Concentration:  Concentration: Good and Attention Span: Good  Recall:  Good  Fund of Knowledge: Good  Language: Good  Akathisia:  No  Handed:  Right  AIMS (if indicated): not done  Assets:  Communication Skills Desire for Improvement  ADL's:  Intact  Cognition: WNL  Sleep:  {BHH GOOD/FAIR/POOR:22877}   Screenings: GAD-7    Flowsheet Row Office Visit from 10/03/2022 in [facility] at [facility] Office Visit from 06/28/2022 in [facility] Office Visit from 06/22/2022 in [facility] Primary Care & Sports Medicine at [facility] Office Visit from 05/26/2022 in [facility] Office Visit from 05/05/2022 in [facility]  Total GAD-7 Score 21 21 21 21 21       PHQ2-9    Flowsheet Row Office Visit from 10/03/2022 in [facility] Primary Care & Sports Medicine at [facility] Office Visit from 06/28/2022 in [facility] Office Visit from 06/22/2022 in [facility] Primary [facility] & [facility] Medicine at [facility] Office Visit from 05/26/2022 in [facility] Office Visit from 05/05/2022 in [facility]  PHQ-2 Total Score 6 6 6 6 6   PHQ-9 Total Score 22 24 21 24 21       Flowsheet Row Office Visit from 06/28/2022 in [facility] Office Visit from  05/26/2022 in [facility] Office Visit from 05/05/2022 in [facility]   C-SSRS RISK CATEGORY No Risk No Risk No Risk        Assessment and Plan:  Toni Green is a 62 y.o. 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    1. Major depressive disorder, recurrent episode, moderate (HCC) Acute stressors include: conflict with her mother, who has medical condition, GI symptoms s/p colostomy reversal on Feb 23  Other stressors include: complication from surgery, unemployment, financial strain, loss of her sister in Nov 2022   History:   She continues to struggle with depressive symptoms in the context of stressors as above.  Will continue Pristiq and buspirone to target depression/anxiety at this time, although both of the medication might need to be adjusted/reduced if she continues to have appetite loss.  She is advised to hold the propranolol to avoid dizziness.    2. Insomnia, unspecified type Worsening in the context of stressors as above.  She was advised to hold lemborexant if she experiences any drowsiness.    # Binge eating disorder Although she was strongly encouraged to taper down the topiramate given her current GI symptoms, she has fear of gaining weight in the future despite her current appetite loss.  She does not have distorted body image on today's evaluation, and would like to eat to feel better.  Will continue current dose at this time and will continue to discuss if appropriate.    # Alcohol use There is slight worsening in alcohol use on weekend.  She is advised to cut down alcohol use especially given her current GI symptoms/risk of dehydration.  Discussed pharmacological treatment for alcohol abstinence if she is interested.       Plan  Continue Pristiq 100 mg daily  - optum, generic Continue Buspar 7.5 mg twice a day (limited benefit from uptitration) Continue propranolol 20 mg daily as needed for anxiety Continue topiramate 200 mg daily Continue lemborexant 5 mg at night as needed for sleep - [facility] Next appointment-  6/25 at 2 30, video She agrees to do in person visit at least every few months (she reports concern due to financial strain) - on tramadol - EKG QTc 394 msec on 11/2020   Past trials of medication- sertraline, lexapro, venlafaxine, bupropion (palpitation), Abilify, quetiapine (eating sweets), Geodon (increase in appetite). Ambien, Lunesta (severe drowsiness)  Collaboration of Care: Collaboration of Care: {BH OP Collaboration of Care:21014065}  Patient/Guardian was advised Release of Information must be obtained prior to any record release in order to collaborate their care with an outside provider. Patient/Guardian was advised if they have not already done so to contact the registration department to sign all necessary forms in order for [place] to release information regarding their care.   Consent: Patient/Guardian gives verbal consent for treatment and assignment of benefits for services provided during this visit. Patient/Guardian expressed understanding and agreed to proceed.    [name], MD 04/04/2023, 8:06 AM

## 2023-04-10 ENCOUNTER — Ambulatory Visit: Payer: 59 | Admitting: Clinical

## 2023-04-11 ENCOUNTER — Telehealth: Payer: 59 | Admitting: Psychiatry

## 2023-04-17 ENCOUNTER — Ambulatory Visit: Payer: 59 | Admitting: Clinical

## 2023-04-18 ENCOUNTER — Other Ambulatory Visit: Payer: Self-pay | Admitting: Psychiatry

## 2023-04-24 ENCOUNTER — Other Ambulatory Visit: Payer: Self-pay | Admitting: Psychiatry

## 2023-04-24 ENCOUNTER — Telehealth: Payer: Self-pay

## 2023-04-24 DIAGNOSIS — F5081 Binge eating disorder: Secondary | ICD-10-CM

## 2023-04-24 MED ORDER — TOPIRAMATE 100 MG PO TABS
200.0000 mg | ORAL_TABLET | Freq: Every day | ORAL | 1 refills | Status: DC
Start: 2023-04-24 — End: 2023-09-26

## 2023-04-24 NOTE — Telephone Encounter (Signed)
Received fax from pharmacy requesting a refill for the following medication please advise  Last visit 03/01/23 Next visit 05/11/23   topiramate (TOPAMAX) 100 MG tablet  desvenlafaxine (PRISTIQ) 100 MG 24 hr tablet   Pharmacy  [facility] - [place], [place] - [address] Phone: [phone]  Fax: [phone]

## 2023-04-24 NOTE — Telephone Encounter (Signed)
Ordered

## 2023-05-03 ENCOUNTER — Encounter: Payer: 59 | Admitting: Clinical

## 2023-05-03 NOTE — Progress Notes (Signed)
                Karen Sharpe, LCSWThis encounter was created in error - please disregard. 

## 2023-05-07 NOTE — Progress Notes (Unsigned)
Virtual Visit via Video Note  I connected with Toni Green on 05/11/23 at  3:00 PM EDT by a video enabled telemedicine application and verified that I am speaking with the correct person using two identifiers.  Location: Patient: home Provider: office Persons participated in the visit- patient, provider    I discussed the limitations of evaluation and management by telemedicine and the availability of in person appointments. The patient expressed understanding and agreed to proceed.    I discussed the assessment and treatment plan with the patient. The patient was provided an opportunity to ask questions and all were answered. The patient agreed with the plan and demonstrated an understanding of the instructions.   The patient was advised to call back or seek an in-person evaluation if the symptoms worsen or if the condition fails to improve as anticipated.  I provided 20 minutes of non-face-to-face time during this encounter.   [name], MD    [facility] MD/PA/NP OP Progress Note  05/11/2023 3:41 PM Toni Green  MRN:  [id number]  Chief Complaint:  Chief Complaint  Patient presents with   Follow-up   HPI:  According to the chart review, the following events have occurred since the last visit: The patient underwent Left L3 and L4 medial branch and L5 dorsal ramus blocks.   This is a follow-up appointment for depression, anxiety and insomnia.  She states that she has been doing the same. No worse, no better. She continues to have diarrhea.  She is scheduled for endoscopy, colonoscopy and small intestine biopsy.  She cut down the caffeine intake.  Although she puts on a happy face, she has intense crying while cooking.  She thinks about her sister, who passed away 2 years ago in 2023-09-08.  She feels scared of undergoing TMS.  She is unsure if she will get this.  She is looking forward to have the procedure for nerve. She has insomnia.  She has been eating a little more lately.   She denies SI.  She feels anxious.  She has been drinking alcohol as described below to numb herself.  She thinks she can quit if she wants to.  After provided psychoeducation, she agrees to cut down the amount of alcohol.   Wt Readings from Last 3 Encounters:  10/03/22 162 lb (73.5 kg)  06/28/22 164 lb (74.4 kg)  06/22/22 162 lb (73.5 kg)     Substance use   Tobacco Alcohol Other substances/  Current denies A "couple" of beers and four on weekends CBD gummies at times  Past denies  6 pack on weekend    Past Treatment           Visit Diagnosis:    ICD-10-CM   1. Major depressive disorder, recurrent episode, moderate (HCC)  F33.1     2. Anxiety state  F41.1 propranolol (INDERAL) 20 MG tablet    3. Insomnia, unspecified type  G47.00       Past Psychiatric History: Please see initial evaluation for full details. I have reviewed the history. No updates at this time.     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   Claustrophobia    Complication of surgery 02/20/2015  Overview:  Dilated pouch found on EGD done 12/2014    Depression    History of kidney stones    Hypertension    Hypoglycemia    Hypothyroidism    Inflammation of sacroiliac joint (HCC) 08/22/2014   Pneumonia 03/2020   PONV (postoperative nausea and vomiting)    NASUEATED/BAD HEADACHES   Seizures (HCC) 1985   x1, after labor/childbirth   Thyroid disease    Wears glasses     Past Surgical History:  Procedure Laterality Date   APPENDECTOMY  2010   BACK SURGERY  10/2005   lumbar   COLONOSCOPY WITH PROPOFOL N/A 09/23/2016   Procedure: COLONOSCOPY WITH PROPOFOL;  Surgeon: Midge Minium, MD;  Location: [facility];  Service: Endoscopy;  Laterality: N/A;   COLOSTOMY  10/22/2021    CYSTOSCOPY     x 1   EVALUATION UNDER ANESTHESIA WITH HEMORRHOIDECTOMY N/A 07/06/2020   Procedure: EXAM UNDER ANESTHESIA WITH HEMORRHOIDECTOMY;  Surgeon: Duanne Guess, MD;  Location: [facility];  Service: General;  Laterality: N/A;   GALLBLADDER SURGERY  2009   lap   GASTRIC BYPASS  2008   roux en y   MUCOSAL ADVANCEMENT FLAP N/A 12/10/2020   Procedure: MUCOSAL ADVANCEMENT FLAP;  Surgeon: Romie Levee, MD;  Location: [facility];  Service: General;  Laterality: N/A;  60 MIN   POLYPECTOMY  09/23/2016   Procedure: POLYPECTOMY;  Surgeon: Midge Minium, MD;  Location: [facility];  Service: Endoscopy;;   TOTAL VAGINAL HYSTERECTOMY  1999    Family Psychiatric History: Please see initial evaluation for full details. I have reviewed the history. No updates at this time.     Family History:  Family History  Problem Relation Age of Onset   Depression Mother    Hypertension Mother    Depression Father    Hypertension Father    Depression Sister    Breast cancer Sister 1   Hypertension Maternal Grandfather    Depression Maternal Grandmother    Hypertension Maternal Grandmother    Hypertension Paternal Grandfather    Hypertension Paternal Grandmother     Social History:  Social History   Socioeconomic History   Marital status: Married    Spouse name: Not on file   Number of children: Not on file   Years of education: Not on file   Highest education level: Not on file  Occupational History   Not on file  Tobacco Use   Smoking status: Never   Smokeless tobacco: Never  Vaping Use   Vaping status: Never Used  Substance and Sexual Activity   Alcohol use: Yes    Alcohol/week: 0.0 standard drinks of alcohol    Comment: RARE   Drug use: No   Sexual activity: Not Currently  Other Topics Concern   Not on file  Social History Narrative   Not on file   Social Determinants of Health   Financial Resource Strain: Low Risk  (06/22/2022)   Overall Financial  Resource Strain (CARDIA)    Difficulty of Paying Living Expenses: Not hard at all  Food Insecurity: No Food Insecurity (06/22/2022)   Hunger Vital Sign    Worried About Running Out of Food in the Last Year: Never true    Ran Out of Food in the Last Year: Never true  Transportation Needs: No Transportation Needs (06/22/2022)   PRAPARE - Administrator, Civil Service (Medical): No    Lack of Transportation (Non-Medical): No  Physical Activity: Not on file  Stress: Not on file  Social Connections: Not on file    Allergies:  Allergies  Allergen Reactions   Losartan Palpitations    Could feel heartbeat (strongly) in neck   Penicillins Rash   Tape Rash    Skin irritation after back surgery white paper tape    Metabolic Disorder Labs: No results found for: "HGBA1C", "MPG" No results found for: "PROLACTIN" Lab Results  Component Value Date   CHOL 225 (H) 02/01/2022   TRIG 67 02/01/2022   HDL 119 02/01/2022   CHOLHDL 1.9 02/01/2022   LDLCALC 94 02/01/2022   LDLCALC 126 (H) 01/07/2021   Lab Results  Component Value Date   TSH 2.330 02/01/2022   TSH 1.360 01/07/2021    Therapeutic Level Labs: No results found for: "LITHIUM" No results found for: "VALPROATE" No results found for: "CBMZ"  Current Medications: Current Outpatient Medications  Medication Sig Dispense Refill   Lemborexant 5 MG TABS Take 1 tablet (5 mg total) by mouth at bedtime as needed (insomnia). 30 tablet 1   ondansetron (ZOFRAN) 8 MG tablet Take 8 mg by mouth every 8 (eight) hours as needed for nausea or vomiting.     promethazine (PHENERGAN) 12.5 MG tablet Take 12.5 mg by mouth every 6 (six) hours as needed for nausea or vomiting.     Ascorbic Acid (VITAMIN C) 1000 MG tablet Take 1,000 mg by mouth at bedtime.     Blood Glucose Monitoring Suppl (ONETOUCH VERIO REFLECT) w/Device KIT 1 each by Does not apply route daily at 6 (six) AM. 1 kit 0   busPIRone (BUSPAR) 7.5 MG tablet Take 1 tablet (7.5 mg  total) by mouth 2 (two) times daily. 180 tablet 1   cetirizine (ZYRTEC) 10 MG tablet Take 10 mg by mouth daily.     Cholecalciferol (VITAMIN D-3) 125 MCG (5000 UT) TABS Take 5,000 Units by mouth at bedtime.     Cyanocobalamin (VITAMIN B-12) 2500 MCG SUBL Place 2,500 mcg under the tongue at bedtime.     desvenlafaxine (PRISTIQ) 100 MG 24 hr tablet Take 1 tablet (100 mg total) by mouth every morning. 90 tablet 1   desvenlafaxine (PRISTIQ) 100 MG 24 hr tablet Take 1 tablet (100 mg total) by mouth daily. 90 tablet 0   ferrous sulfate 325 (65 FE) MG tablet Take 325 mg by mouth at bedtime.     fluticasone (FLONASE) 50 MCG/ACT nasal spray Place 2 sprays into both nostrils daily. 16 g 6   furosemide (LASIX) 20 MG tablet TAKE 1 TABLET DAILY (Patient taking differently: Take 20 mg by mouth daily as needed (fluid retention.).) 90 tablet 3   glucose blood test strip Use as instructed qid 100 each 12   ibuprofen (ADVIL) 800 MG tablet Take 1 tablet (800 mg total) by mouth every 8 (eight) hours as needed. 30 tablet 0   irbesartan (AVAPRO) 300 MG tablet TAKE 1 TABLET BY MOUTH DAILY (Patient taking differently: Take 150 mg by mouth daily.) 90 tablet 1   levothyroxine (SYNTHROID) 75 MCG tablet TAKE 1 TABLET BY MOUTH DAILY  BEFORE BREAKFAST 90 tablet 1   magnesium oxide (MAG-OX) 400 MG tablet Take 400 mg by mouth at bedtime.     methocarbamol (ROBAXIN) 500 MG tablet 1/2-1 po qHS prn     OneTouch Delica Lancets 30G MISC 1 each by Does not apply route 4 (four) times daily as needed. 100 each 12   prazosin (MINIPRESS) 2 MG capsule TAKE 1 CAPSULE BY MOUTH AT  BEDTIME  90 capsule 3   propranolol (INDERAL) 20 MG tablet Take 1 tablet (20 mg total) by mouth daily as needed. for anxiety 90 tablet 0   rOPINIRole (REQUIP) 4 MG tablet TAKE 1 TABLET BY MOUTH AT  BEDTIME 90 tablet 3   tamsulosin (FLOMAX) 0.4 MG CAPS capsule Take 1 capsule (0.4 mg total) by mouth daily as needed. (Patient taking differently: Take 0.4 mg by mouth  daily as needed (Kidney stones).) 30 capsule 2   topiramate (TOPAMAX) 100 MG tablet Take 2 tablets (200 mg total) by mouth at bedtime. 180 tablet 1   traMADol (ULTRAM) 50 MG tablet Take 25-50 mg by mouth 2 (two) times daily as needed (back pain.).      valACYclovir (VALTREX) 1000 MG tablet as needed.     No current facility-administered medications for this visit.     Musculoskeletal: Strength & Muscle Tone:  N/A Gait & Station:  N/A Patient leans: N/A  Psychiatric Specialty Exam: Review of Systems  Psychiatric/Behavioral:  Positive for dysphoric mood and sleep disturbance. Negative for agitation, behavioral problems, confusion, decreased concentration, hallucinations, self-injury and suicidal ideas. The patient is nervous/anxious. The patient is not hyperactive.   All other systems reviewed and are negative.   There were no vitals taken for this visit.There is no height or weight on file to calculate BMI.  General Appearance: Fairly Groomed  Eye Contact:  Good  Speech:  Clear and Coherent  Volume:  Normal  Mood:  Depressed  Affect:  Appropriate, Congruent, and Tearful  Thought Process:  Coherent  Orientation:  Full (Time, Place, and Person)  Thought Content: Logical   Suicidal Thoughts:  No  Homicidal Thoughts:  No  Memory:  Immediate;   Good  Judgement:  Good  Insight:  Good  Psychomotor Activity:  Normal  Concentration:  Concentration: Good and Attention Span: Good  Recall:  Good  Fund of Knowledge: Good  Language: Good  Akathisia:  No  Handed:  Right  AIMS (if indicated): not done  Assets:  Communication Skills Desire for Improvement  ADL's:  Intact  Cognition: WNL  Sleep:  Poor   Screenings: GAD-7    Flowsheet Row Office Visit from 10/03/2022 in [facility] at [facility] Office Visit from 06/28/2022 in [facility] Office Visit from 06/22/2022 in [facility]  at [facility] Office Visit from 05/26/2022 in [facility] Office Visit from 05/05/2022 in [facility]  Total GAD-7 Score 21 21 21 21 21       PHQ2-9    Flowsheet Row Office Visit from 10/03/2022 in [facility] at [facility] Office Visit from 06/28/2022 in [facility] Office Visit from 06/22/2022 in [facility] at [facility] Office Visit from 05/26/2022 in [facility] Office Visit from 05/05/2022 in [facility]  PHQ-2 Total Score 6 6 6 6 6   PHQ-9 Total Score 22 24 21 24 21       Flowsheet Row Office Visit from 06/28/2022 in [facility] Office Visit from 05/26/2022 in [facility] Office Visit from 05/05/2022 in [facility]  C-SSRS RISK CATEGORY No Risk No Risk No Risk        Assessment and Plan:  Fujie Dickison  Tilson is a 62 y.o. 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   2. Major depressive disorder, recurrent episode, moderate (HCC) 1. Anxiety state Acute stressors include: conflict with her mother, who has medical condition, GI symptoms s/p colostomy reversal on Feb 23  Other stressors include: complication from surgery, unemployment, financial strain, loss of her sister in Nov 2022   History:   She continues to depressive symptoms and anxiety in the context of stressors as above.   She is now on the fence about proceeding TMS.  Provided psychoeducation about this procedure, risk and benefit.  She was also encouraged to consider having appointment for further information.  Will continue current medication regimen at this time, although they consider adjustment if she is not proceeding with TMS.  Will continue Pristiq to target depression, anxiety along with buspirone for anxiety.  Will continue propranolol as needed for anxiety, although this new medication is to be used judiciously given possible dehydration from her GI symptoms.   3. Insomnia, unspecified type Unstable.  Will continue current dose of lemborexant at this time as needed for insomnia.     # Binge eating disorder Unstable. Although she was strongly encouraged to taper down the topiramate given her current GI symptoms, she has fear of gaining weight in the future despite her current appetite loss.  She does not have distorted body image on today's evaluation, and would like to eat to feel better.  Will continue current dose at this time for binge eating.   # Alcohol use  There has been overall worsening in alcohol use.  She agrees to cut down to avoid dependence.  Discussed pharmacological treatment for alcohol abstinence; she is not interested at this time.    # vitamin deficiencies She stopped taking vitamins due to ongoing diarrhea.  She is at risk of vitamin deficiency especially given history of bariatric surgery.  She has an upcoming appointment with her provider, and has agreed to check those including ferritin given her fatigue, worsening in restless leg.      Plan  Continue Pristiq 100 mg daily  - optum, generic Continue Buspar 7.5 mg twice a day (limited benefit from uptitration) Continue propranolol 20 mg daily as needed for anxiety Continue topiramate 200 mg daily Continue lemborexant 5 mg at night as needed for sleep - [facility] Next appointment-  9/12 at 9 am, IP She agrees to do in person visit at  least every few months (she reports concern due to financial strain) - on tramadol - EKG QTc 394 msec on 11/2020  Past trials of medication- sertraline, lexapro, venlafaxine, bupropion (palpitation), Abilify, quetiapine (eating sweets), Geodon (increase in appetite). Ambien, Lunesta (severe drowsiness)     Collaboration of Care: Collaboration of Care: Other reviewed notes in Epic  Patient/Guardian was advised Release of Information must be obtained prior to any record release in order to collaborate their care with an outside provider. Patient/Guardian was advised if they have not already done so to contact the registration department to sign all necessary forms in order for [place] to release information regarding their care.   Consent: Patient/Guardian gives verbal consent for treatment and assignment of benefits for services provided during this visit. Patient/Guardian expressed understanding and agreed to  proceed.    [name], MD 05/11/2023, 3:41 PM

## 2023-05-09 ENCOUNTER — Telehealth: Payer: Self-pay

## 2023-05-09 ENCOUNTER — Other Ambulatory Visit: Payer: Self-pay | Admitting: Psychiatry

## 2023-05-09 MED ORDER — DESVENLAFAXINE SUCCINATE ER 100 MG PO TB24: 100.0000 mg | ORAL_TABLET | Freq: Every day | ORAL | 0 refills | Status: DC

## 2023-05-09 NOTE — Telephone Encounter (Signed)
Pt.notified

## 2023-05-09 NOTE — Telephone Encounter (Signed)
received fax requesting a refill on the desvenlafax. pt was last seen on5-15 next appt 7-25

## 2023-05-09 NOTE — Telephone Encounter (Signed)
Ordered

## 2023-05-11 ENCOUNTER — Encounter: Payer: Self-pay | Admitting: Psychiatry

## 2023-05-11 ENCOUNTER — Telehealth (INDEPENDENT_AMBULATORY_CARE_PROVIDER_SITE_OTHER): Payer: 59 | Admitting: Psychiatry

## 2023-05-11 DIAGNOSIS — F411 Generalized anxiety disorder: Secondary | ICD-10-CM

## 2023-05-11 DIAGNOSIS — F331 Major depressive disorder, recurrent, moderate: Secondary | ICD-10-CM | POA: Diagnosis not present

## 2023-05-11 DIAGNOSIS — G4700 Insomnia, unspecified: Secondary | ICD-10-CM

## 2023-05-11 MED ORDER — BUSPIRONE HCL 7.5 MG PO TABS: 7.5000 mg | ORAL_TABLET | Freq: Two times a day (BID) | ORAL | 1 refills | Status: DC

## 2023-05-11 MED ORDER — LEMBOREXANT 5 MG PO TABS: 5.0000 mg | ORAL_TABLET | Freq: Every evening | ORAL | 1 refills | Status: DC | PRN

## 2023-05-11 MED ORDER — PROPRANOLOL HCL 20 MG PO TABS
20.0000 mg | ORAL_TABLET | Freq: Every day | ORAL | 0 refills | Status: AC | PRN
Start: 2023-05-11 — End: 2023-08-09

## 2023-05-11 NOTE — Patient Instructions (Addendum)
Continue Pristiq 100 mg daily   Continue Buspar 7.5 mg twice a day  Continue propranolol 20 mg daily as needed for anxiety Continue topiramate 200 mg daily Continue lemborexant 5 mg at night as needed for sleep  Next appointment-  9/12 at 9 am

## 2023-06-21 NOTE — Progress Notes (Signed)
BH MD/PA/NP OP Progress Note  06/29/2023 12:51 PM Toni Green  MRN:  [id number]  Chief Complaint:  Chief Complaint  Patient presents with   Follow-up   HPI:  This is a follow-up appointment for depression, anxiety and insomnia. She states that she torn her knee when she went to a beach.  She went there with her son, daughter-in-law and grandchildren.  She feels that the relationship has been finally better with him after 39 years.  She also went to the beach with her friend in the time.  She has been working on crafts.  She has started to crochet this week.  She wanted to do something as she feels like she is wasting the time without doing anything.  She feels restless.  She has significant difficulty in concentration, and it has been difficult for her to read books.  She also has insomnia.  She takes 1.5 hours of nap during the day.  She goes to bed around 10 PM, and sleeps around 3:30 am through 6 AM.  She tends to watch cell phone.  When she was advised to include sleep hygiene, she states that she has been working on it with her therapist as well.  She talks about grief, including the loss of her sister.  It will be two years, and she thinks she has not made any progress in this.  Validated her grief, and highlighted the progress she has made. The patient has mood symptoms as in PHQ-9/GAD-7.  She reports significant concern of gaining weight , although her appetite has not changed.  She denies SI.  She is scared of the idea of TMS.  She agrees with the medication changes as outlined below.   Substance use   Tobacco Alcohol Other substances/  Current denies A "couple" of beers and four on weekends. A few glass of wine on some day CBD gummies at times  Past denies  6 pack on weekend    Past Treatment           Wt Readings from Last 3 Encounters:  06/29/23 171 lb 3.2 oz (77.7 kg)  10/03/22 162 lb (73.5 kg)  06/28/22 164 lb (74.4 kg)     Visit Diagnosis:    ICD-10-CM   1. Major  depressive disorder, recurrent episode, moderate (HCC)  F33.1     2. Anxiety state  F41.1     3. Insomnia, unspecified type  G47.00       Past Psychiatric History: Please see initial evaluation for full details. I have reviewed the history. No updates at this time.     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   Claustrophobia    Complication of surgery 02/20/2015   Overview:  Dilated pouch found on EGD done 12/2014    Depression    History of kidney stones    Hypertension    Hypoglycemia    Hypothyroidism    Inflammation of sacroiliac joint (HCC) 08/22/2014   Pneumonia 03/2020   PONV (postoperative nausea and vomiting)    NASUEATED/BAD HEADACHES   Seizures (HCC) 1985   x1, after labor/childbirth   Thyroid disease    Wears glasses     Past Surgical  History:  Procedure Laterality Date   APPENDECTOMY  2010   BACK SURGERY  10/2005   lumbar   COLONOSCOPY WITH PROPOFOL N/A 09/23/2016   Procedure: COLONOSCOPY WITH PROPOFOL;  Surgeon: Midge Minium, MD;  Location: [facility];  Service: Endoscopy;  Laterality: N/A;   COLOSTOMY  10/22/2021   CYSTOSCOPY     x 1   EVALUATION UNDER ANESTHESIA WITH HEMORRHOIDECTOMY N/A 07/06/2020   Procedure: EXAM UNDER ANESTHESIA WITH HEMORRHOIDECTOMY;  Surgeon: Duanne Guess, MD;  Location: [facility];  Service: General;  Laterality: N/A;   GALLBLADDER SURGERY  2009   lap   GASTRIC BYPASS  2008   roux en y   MUCOSAL ADVANCEMENT FLAP N/A 12/10/2020   Procedure: MUCOSAL ADVANCEMENT FLAP;  Surgeon: Romie Levee, MD;  Location: [facility];  Service: General;  Laterality: N/A;  60 MIN   POLYPECTOMY  09/23/2016   Procedure: POLYPECTOMY;  Surgeon: Midge Minium, MD;  Location: [facility];  Service:  Endoscopy;;   TOTAL VAGINAL HYSTERECTOMY  1999    Family Psychiatric History: Please see initial evaluation for full details. I have reviewed the history. No updates at this time.     Family History:  Family History  Problem Relation Age of Onset   Depression Mother    Hypertension Mother    Depression Father    Hypertension Father    Depression Sister    Breast cancer Sister 82   Hypertension Maternal Grandfather    Depression Maternal Grandmother    Hypertension Maternal Grandmother    Hypertension Paternal Grandfather    Hypertension Paternal Grandmother     Social History:  Social History   Socioeconomic History   Marital status: Married    Spouse name: Not on file   Number of children: Not on file   Years of education: Not on file   Highest education level: Not on file  Occupational History   Not on file  Tobacco Use   Smoking status: Never   Smokeless tobacco: Never  Vaping Use   Vaping status: Never Used  Substance and Sexual Activity   Alcohol use: Yes    Alcohol/week: 0.0 standard drinks of alcohol    Comment: RARE   Drug use: No   Sexual activity: Not Currently  Other Topics Concern   Not on file  Social History Narrative   Not on file   Social Determinants of Health   Financial Resource Strain: Low Risk  (06/22/2022)   Overall Financial Resource Strain (CARDIA)    Difficulty of Paying Living Expenses: Not hard at all  Food Insecurity: No Food Insecurity (06/22/2022)   Hunger Vital Sign    Worried About Running Out of Food in the Last Year: Never true    Ran Out of Food in the Last Year: Never true  Transportation Needs: No Transportation Needs (06/22/2022)   PRAPARE - Administrator, Civil Service (Medical): No    Lack of Transportation (Non-Medical): No  Physical Activity: Not on file  Stress: Not on file  Social Connections: Not on file    Allergies:  Allergies  Allergen Reactions   Losartan Palpitations    Could feel heartbeat  (strongly) in neck   Penicillins Rash   Tape Rash    Skin irritation after back surgery white paper tape    Metabolic Disorder Labs: No results found for: "HGBA1C", "MPG" No results found for: "PROLACTIN" Lab Results  Component Value Date   CHOL 225 (H) 02/01/2022  TRIG 67 02/01/2022   HDL 119 02/01/2022   CHOLHDL 1.9 02/01/2022   LDLCALC 94 02/01/2022   LDLCALC 126 (H) 01/07/2021   Lab Results  Component Value Date   TSH 2.330 02/01/2022   TSH 1.360 01/07/2021    Therapeutic Level Labs: No results found for: "LITHIUM" No results found for: "VALPROATE" No results found for: "CBMZ"  Current Medications: Current Outpatient Medications  Medication Sig Dispense Refill   Ascorbic Acid (VITAMIN C) 1000 MG tablet Take 1,000 mg by mouth at bedtime.     Blood Glucose Monitoring Suppl (ONETOUCH VERIO REFLECT) w/Device KIT 1 each by Does not apply route daily at 6 (six) AM. 1 kit 0   busPIRone (BUSPAR) 7.5 MG tablet Take 1 tablet (7.5 mg total) by mouth 2 (two) times daily. 180 tablet 1   cariprazine (VRAYLAR) 1.5 MG capsule Take 1 capsule (1.5 mg total) by mouth daily. 90 capsule 0   cetirizine (ZYRTEC) 10 MG tablet Take 10 mg by mouth daily.     Cholecalciferol (VITAMIN D-3) 125 MCG (5000 UT) TABS Take 5,000 Units by mouth at bedtime.     Cyanocobalamin (VITAMIN B-12) 2500 MCG SUBL Place 2,500 mcg under the tongue at bedtime.     ferrous sulfate 325 (65 FE) MG tablet Take 325 mg by mouth at bedtime.     fluticasone (FLONASE) 50 MCG/ACT nasal spray Place 2 sprays into both nostrils daily. 16 g 6   furosemide (LASIX) 20 MG tablet TAKE 1 TABLET DAILY (Patient taking differently: Take 20 mg by mouth daily as needed (fluid retention.).) 90 tablet 3   glucose blood test strip Use as instructed qid 100 each 12   ibuprofen (ADVIL) 800 MG tablet Take 1 tablet (800 mg total) by mouth every 8 (eight) hours as needed. 30 tablet 0   irbesartan (AVAPRO) 300 MG tablet TAKE 1 TABLET BY MOUTH  DAILY (Patient taking differently: Take 150 mg by mouth daily.) 90 tablet 1   levothyroxine (SYNTHROID) 75 MCG tablet TAKE 1 TABLET BY MOUTH DAILY  BEFORE BREAKFAST 90 tablet 0   magnesium oxide (MAG-OX) 400 MG tablet Take 400 mg by mouth at bedtime.     methocarbamol (ROBAXIN) 500 MG tablet 1/2-1 po qHS prn     ondansetron (ZOFRAN) 8 MG tablet Take 8 mg by mouth every 8 (eight) hours as needed for nausea or vomiting.     OneTouch Delica Lancets 30G MISC 1 each by Does not apply route 4 (four) times daily as needed. 100 each 12   promethazine (PHENERGAN) 12.5 MG tablet Take 12.5 mg by mouth every 6 (six) hours as needed for nausea or vomiting.     propranolol (INDERAL) 20 MG tablet Take 1 tablet (20 mg total) by mouth daily as needed. for anxiety 90 tablet 0   ramelteon (ROZEREM) 8 MG tablet Take 1 tablet (8 mg total) by mouth at bedtime. 30 tablet 1   rOPINIRole (REQUIP) 4 MG tablet TAKE 1 TABLET BY MOUTH AT  BEDTIME 90 tablet 3   tamsulosin (FLOMAX) 0.4 MG CAPS capsule Take 1 capsule (0.4 mg total) by mouth daily as needed. (Patient taking differently: Take 0.4 mg by mouth daily as needed (Kidney stones).) 30 capsule 2   topiramate (TOPAMAX) 100 MG tablet Take 2 tablets (200 mg total) by mouth at bedtime. 180 tablet 1   traMADol (ULTRAM) 50 MG tablet Take 25-50 mg by mouth 2 (two) times daily as needed (back pain.).      valACYclovir (VALTREX)  1000 MG tablet as needed.     [START ON 08/07/2023] desvenlafaxine (PRISTIQ) 100 MG 24 hr tablet Take 1 tablet (100 mg total) by mouth daily. 90 tablet 0   prazosin (MINIPRESS) 2 MG capsule TAKE 1 CAPSULE BY MOUTH AT  BEDTIME (Patient not taking: Reported on 06/29/2023) 90 capsule 3   No current facility-administered medications for this visit.     Musculoskeletal: Strength & Muscle Tone:  normal Gait & Station: normal Patient leans: N/A  Psychiatric Specialty Exam: Review of Systems  Psychiatric/Behavioral:  Positive for decreased concentration,  dysphoric mood and sleep disturbance. Negative for agitation, behavioral problems, confusion, hallucinations, self-injury and suicidal ideas. The patient is nervous/anxious. The patient is not hyperactive.   All other systems reviewed and are negative.   Blood pressure 132/89, pulse 67, temperature (!) 96.8 F (36 C), temperature source Skin, height 5\' 10"  (1.778 m), weight 171 lb 3.2 oz (77.7 kg).Body mass index is 24.56 kg/m.  General Appearance: Well Groomed  Eye Contact:  Good  Speech:  Clear and Coherent  Volume:  Normal  Mood:  Depressed  Affect:  Appropriate, Congruent, and Tearful  Thought Process:  Coherent  Orientation:  Full (Time, Place, and Person)  Thought Content: Logical   Suicidal Thoughts:  No  Homicidal Thoughts:  No  Memory:  Immediate;   Good  Judgement:  Good  Insight:  Good  Psychomotor Activity:  Normal  Concentration:  Concentration: Good and Attention Span: Good  Recall:  Good  Fund of Knowledge: Good  Language: Good  Akathisia:  No  Handed:  Right  AIMS (if indicated): not done  Assets:  Communication Skills Desire for Improvement  ADL's:  Intact  Cognition: WNL  Sleep:  Poor   Screenings: GAD-7    Flowsheet Row Office Visit from 06/29/2023 in [facility] Office Visit from 10/03/2022 in [facility] Office Visit from 06/28/2022 in [facility] Office Visit from 06/22/2022 in [facility] at [facility] Office Visit from 05/26/2022 in [facility]  Total GAD-7 Score 21 21 21 21 21       PHQ2-9    Flowsheet Row Office Visit from 06/29/2023 in [facility] Office Visit from 10/03/2022 in [facility] at [facility] Office Visit from 06/28/2022 in [facility] Office Visit from 06/22/2022 in [facility] at [facility] Office Visit from 05/26/2022 in [facility]  PHQ-2 Total Score 6 6 6 6 6   PHQ-9 Total Score 23 22 24 21 24       Flowsheet Row Office Visit from 06/28/2022 in [facility] Office Visit from 05/26/2022 in [facility] Office Visit from 05/05/2022 in [facility]  C-SSRS RISK CATEGORY No Risk No Risk No Risk        Assessment and Plan:  Toni Green is a 62 y.o. 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   1. Major depressive disorder, recurrent episode, moderate (  HCC) 2. Anxiety state Acute stressors include: conflict with her mother, who has medical condition, GI symptoms s/p colostomy reversal on Feb 23  Other stressors include: complication from surgery, unemployment, financial strain, loss of her sister in Nov 2022   History:   She continues to experience depressive symptoms and anxiety, although she has started to engage in some activities compared to before.  She reports fear about proceeding TMS.  We plan to adjust her medication, as she has not pursued TMS despite discussing it over several months.  Will add Vraylar as adjunctive treatment for depression.  Discussed potential metabolic side effect, EPS and QTc prolongation.  She prefers adjunctive treatment as she prefers to stay on Pristiq.  Will continue current dose of Pristiq to target depression, anxiety, along with BuSpar for anxiety.  Will  continue propranolol as needed for anxiety.  Will use this  judiciously given possible dehydration from her GI symptoms.   3. Insomnia, unspecified type Unstable.  Will discontinue lemborexant due to limited benefit.  Will try ramelteon as needed for insomnia.     # Binge eating disorder Unchanged.  She denies binge eating.  Will continue current dose of topiramate to target binge eating.    # Alcohol use  She continues to drink alcohol.  She is not interested in pharmacological treatment.  Will continue to assess.    # vitamin deficiencies She stopped taking vitamins due to ongoing diarrhea.  She is at risk of vitamin deficiency especially given history of bariatric surgery.  She has an upcoming appointment with her provider, and has agreed to check those including ferritin given her fatigue, worsening in restless leg.      Plan  Continue Pristiq 100 mg daily  - [facility], generic Start Vraylar 1.5 mg daily  Continue Buspar 7.5 mg twice a day (limited benefit from uptitration) Continue propranolol 20 mg daily as needed for anxiety Continue topiramate 200 mg daily Discontinue lemborexant (was on 5 mg ) Start ramelteon 8 mg at night  Next appointment-  11/5 at 8:30 am, IP She agrees to do in person visit at least every few months (she reports concern due to financial strain) - on tramadol - EKG QTc 394 msec on 11/2020   Past trials of medication- sertraline, lexapro, venlafaxine, bupropion (palpitation), Abilify, quetiapine (eating sweets), Geodon (increase in appetite). Trazodone, Vernon Prey (severe drowsiness), lemborexant    Collaboration of Care: Collaboration of Care: Other reviewed notes in Epic  Patient/Guardian was advised Release of Information must be obtained prior to any record release in order to collaborate their care with an outside provider. Patient/Guardian was advised if they have not already done so to contact the registration department to sign all necessary forms in  order for [place] to release information regarding their care.   Consent: Patient/Guardian gives verbal consent for treatment and assignment of benefits for services provided during this visit. Patient/Guardian expressed understanding and agreed to proceed.   The duration of the time spent on the following activities on the date of the encounter was 45 minutes.   Preparing to see the patient (e.g., review of test, records)  Obtaining and/or reviewing separately obtained history  Performing a medically necessary exam and/or evaluation  Counseling and educating the patient/family/caregiver  Ordering medications, tests, or procedures  Referring and communicating with other healthcare professionals (when not reported separately)  Documenting clinical information in the electronic or paper health record  Independently interpreting results of tests/labs and communication of results to the family  or caregiver  [name], MD 06/29/2023, 12:51 PM

## 2023-06-24 ENCOUNTER — Other Ambulatory Visit: Payer: Self-pay | Admitting: Internal Medicine

## 2023-06-24 DIAGNOSIS — E033 Postinfectious hypothyroidism: Secondary | ICD-10-CM

## 2023-06-27 ENCOUNTER — Other Ambulatory Visit: Payer: Self-pay | Admitting: Orthopedic Surgery

## 2023-06-27 ENCOUNTER — Ambulatory Visit: Payer: 59 | Admitting: Internal Medicine

## 2023-06-27 ENCOUNTER — Telehealth: Payer: Self-pay | Admitting: Internal Medicine

## 2023-06-27 DIAGNOSIS — M1711 Unilateral primary osteoarthritis, right knee: Secondary | ICD-10-CM

## 2023-06-27 DIAGNOSIS — M2391 Unspecified internal derangement of right knee: Secondary | ICD-10-CM

## 2023-06-27 DIAGNOSIS — M25561 Pain in right knee: Secondary | ICD-10-CM

## 2023-06-27 DIAGNOSIS — M2351 Chronic instability of knee, right knee: Secondary | ICD-10-CM

## 2023-06-27 NOTE — Telephone Encounter (Signed)
[facility] calling , [place], to ask if ok to switch manufacturers on pt's levothyroxine (SYNTHROID) 75 MCG tablet   Pharmacy direct line [phone] Ref no: [id number]

## 2023-06-27 NOTE — Telephone Encounter (Signed)
Verbal okay given to pharmacy to switch manufacturers.

## 2023-06-29 ENCOUNTER — Ambulatory Visit
Admission: RE | Admit: 2023-06-29 | Discharge: 2023-06-29 | Disposition: A | Discharge status: Home / Self Care | Payer: 59 | Source: Ambulatory Visit | Attending: Orthopedic Surgery | Admitting: Orthopedic Surgery

## 2023-06-29 ENCOUNTER — Encounter: Payer: Self-pay | Admitting: Psychiatry

## 2023-06-29 ENCOUNTER — Telehealth: Payer: Self-pay

## 2023-06-29 ENCOUNTER — Ambulatory Visit (INDEPENDENT_AMBULATORY_CARE_PROVIDER_SITE_OTHER): Payer: 59 | Admitting: Psychiatry

## 2023-06-29 VITALS — BP 132/89 | HR 67 | Temp 96.8°F | Ht 70.0 in | Wt 171.2 lb

## 2023-06-29 DIAGNOSIS — M2351 Chronic instability of knee, right knee: Secondary | ICD-10-CM

## 2023-06-29 DIAGNOSIS — M25561 Pain in right knee: Secondary | ICD-10-CM

## 2023-06-29 DIAGNOSIS — M1711 Unilateral primary osteoarthritis, right knee: Secondary | ICD-10-CM

## 2023-06-29 DIAGNOSIS — G4700 Insomnia, unspecified: Secondary | ICD-10-CM

## 2023-06-29 DIAGNOSIS — F331 Major depressive disorder, recurrent, moderate: Secondary | ICD-10-CM | POA: Diagnosis not present

## 2023-06-29 DIAGNOSIS — F411 Generalized anxiety disorder: Secondary | ICD-10-CM | POA: Diagnosis not present

## 2023-06-29 DIAGNOSIS — M2391 Unspecified internal derangement of right knee: Secondary | ICD-10-CM

## 2023-06-29 MED ORDER — CARIPRAZINE HCL 1.5 MG PO CAPS: 1.5000 mg | ORAL_CAPSULE | Freq: Every day | ORAL | 0 refills | Status: DC

## 2023-06-29 MED ORDER — DESVENLAFAXINE SUCCINATE ER 100 MG PO TB24: 100.0000 mg | ORAL_TABLET | Freq: Every day | ORAL | 0 refills | Status: DC

## 2023-06-29 MED ORDER — RAMELTEON 8 MG PO TABS: 8.0000 mg | ORAL_TABLET | Freq: Every day | ORAL | 1 refills | Status: DC

## 2023-06-29 NOTE — Telephone Encounter (Signed)
received fax that a prior auth was needed for the ramelteon 8mg 

## 2023-06-29 NOTE — Telephone Encounter (Signed)
went online to [email or web address] and submitted the prior auth . - pending 

## 2023-06-29 NOTE — Patient Instructions (Signed)
Continue Pristiq 100 mg daily   Start Vraylar 1.5 mg daily  Continue Buspar 7.5 mg twice a day  Continue propranolol 20 mg daily as needed for anxiety Continue topiramate 200 mg daily Discontinue lemborexant Start ramelteon 8 mg at night  Next appointment-  11/5 at 8:30 am

## 2023-07-03 NOTE — Telephone Encounter (Signed)
prior Berkley Harvey was approved on sept 13th [place]- [id number] covered until 06-28-2024

## 2023-07-05 ENCOUNTER — Ambulatory Visit: Payer: 59 | Admitting: Internal Medicine

## 2023-07-05 ENCOUNTER — Telehealth: Payer: Self-pay | Admitting: Internal Medicine

## 2023-07-05 NOTE — Telephone Encounter (Signed)
Copied from CRM [id number]. Topic: General - Inquiry >> Jul 05, 2023  8:11 AM De Blanch wrote: Reason for CRM:Pt stated she was put in a full-leg brace on her right leg, due to a knee injury, and she cannot drive. Pt had to cancel the appointment scheduled for this morning. Pt asked for the no-show fee to be waived. She stated she has proof this was put on yesterday.  Please advise.

## 2023-07-06 ENCOUNTER — Other Ambulatory Visit: Payer: Self-pay | Admitting: Psychiatry

## 2023-07-10 ENCOUNTER — Ambulatory Visit (INDEPENDENT_AMBULATORY_CARE_PROVIDER_SITE_OTHER): Payer: 59 | Admitting: Clinical

## 2023-07-10 DIAGNOSIS — F331 Major depressive disorder, recurrent, moderate: Secondary | ICD-10-CM | POA: Diagnosis not present

## 2023-07-10 NOTE — Progress Notes (Signed)
Doree Barthel, LCSW

## 2023-07-10 NOTE — Progress Notes (Signed)
[facility] Counselor/Therapist Progress Note  [name], MRN: [id number],    Date: 07/10/2023  Time Spent: 10:33am -11:36am : 63 minutes   Treatment Type: Individual Therapy  Reported Symptoms: Patient reported she continues to experience difficulty falling asleep and staying asleep.   Mental Status Exam: Appearance:  Neat and Well Groomed     Behavior: Appropriate  Motor: Normal  Speech/Language:  Clear and Coherent  Affect: Tearful  Mood: depressed  Thought process: normal  Thought content:   WNL  Sensory/Perceptual disturbances:   WNL  Orientation: oriented to person, place, and situation  Attention: Good  Concentration: Good  Memory: WNL  Fund of knowledge:  Good  Insight:   Good  Judgment:  Good  Impulse Control: Good   Risk Assessment: Danger to Self:  No Patient denied current suicidal ideation  Self-injurious Behavior: No Danger to Others: No Patient denied current homicidal ideation Duty to Warn:no Physical Aggression / Violence:No  Access to Firearms a concern: No  Gang Involvement:No   Subjective: Patient stated, "I really got into a funk, I just didn't want to talk, I didn't want to do anything, I didn't want to see anyone" in response to patient's recent absence from participation in therapy.  Patient reported feeling she can not complete homework assignments for therapy is a barrier to maintaining homework for therapy.  Patient reported she noted she starts projects patient knows she can complete and stated, "I'm fearful that I can't complete them" in response to larger projects and homework for therapy. Patient stated,  "I can't get my thoughts together enough to complete things". Patient stated, "I'm still struggling with sis" and reported the two year anniversary of patient's sister's passing is coming up. Patient reported she is not able to identify triggers for recent decline in mood. Patient reported feelings of guilt  associated with patient's relationship with her mother. Patient reported her mother continues to ask patient to visit and patient reported she does not want to visit her mother's home due to mother's living environment. Patient reported she would like to visit her mother to spend quality time with mother. Patient reported she feels if she visits her mother at her mother's home patient will be asked to complete multiple tasks.  Patient stated, "I always feel like I have to give an explanation for everything I do or say".  Patient reported she continues to experience difficulty falling asleep and staying asleep and has discussed issues with sleep with patient's psychiatrist.   Interventions: Cognitive Behavioral Therapy and Interpersonal. Clinician conducted session via caregility video from clinician's home office. Patient provided verbal consent to proceed with telehealth session and participated in session from patient's home. Discussed barriers to patient maintaining recent appointments for therapy. Reviewed events since last session. Assisted patient in discussing and exploring barriers for recent decline in mood. Used clarification to better understand patient's experience and assisted patient in discussing, exploring, and examining patient's thoughts and feelings related to patient's relationship with patient's mother. Provided psycho education related to establishing and maintaining healthy boundaries. Explored strategies to address patient's concerns as it relates to patient visiting her mother. Challenged statements/thoughts and interpretations to help patient reframe events. Discussed patient's sleep and discussed the possibility of utilizing CBT-I as a therapeutic intervention. Patient provided verbal consent for clinician to disclose information to determine if patient is appropriate for CBT-I referral. Provided psycho education related to cognitive restructuring and requested for homework patient  utilize socratic questions to challenge negative  thoughts.     Collaboration of Care: Patient provided verbal consent for clinician to disclose information to determine if patient is appropriate for CBT-I referral.   Diagnosis:  Major Depressive Disorder, recurrent, moderate R/O Generalized Anxiety Disorder R/O Social Anxiety Disorder   Treatment Plan: Patient is to utilize Engineer, manufacturing systems Therapy, Dialectical Behavior Therapy Skills, and coping strategies to help decrease symptoms associated with Anxiety and Major Depressive Disorder.   Frequency: Weekly  Modality: individual therapy    Long-term goal:   Patient stated, "I would like to be my old confident self again" as evidence by decrease in hands shaking, knees shaking, voice quivering, and feelings of anxiety from every time there is a social interaction to 0 times when there is a social interaction   Target Date: 06/17/23 - to be reviewed during patient's follow up appointment on 07/24/23  Progress: progressing    Decrease in depressive symptoms as evidence by decrease in crying, loss of interest, lack of energy, difficulty falling asleep/staying asleep from 6 to 7 days per week to 0 to 1 days per week for duration of 6 months to 1 year   Target Date: 06/17/23 - to be reviewed during patient's follow up appointment on 07/24/23  Progress: progressing    Short-term goal:  Develop understanding of the relationship between loss of interest in activities to depression and anxiety and the impact on patient's thought patterns and behaviors   Target Date: 06/17/23- to be reviewed during patient's follow up appointment on 07/24/23  Progress: progressing    Develop understanding between patient's thoughts and feelings, such as, automatic negative thoughts, core beliefs, and worry on patient's level of confidence.    Target Date: 06/17/23- to be reviewed during patient's follow up appointment on 07/24/23  Progress: progressing    Doree Barthel, LCSW

## 2023-07-10 NOTE — Progress Notes (Signed)
                Dezi Schaner, LCSW 

## 2023-07-17 ENCOUNTER — Other Ambulatory Visit: Payer: Self-pay | Admitting: Psychiatry

## 2023-07-17 DIAGNOSIS — F411 Generalized anxiety disorder: Secondary | ICD-10-CM

## 2023-07-24 ENCOUNTER — Ambulatory Visit: Payer: 59 | Admitting: Clinical

## 2023-07-24 ENCOUNTER — Ambulatory Visit (INDEPENDENT_AMBULATORY_CARE_PROVIDER_SITE_OTHER): Payer: 59 | Admitting: Clinical

## 2023-07-24 DIAGNOSIS — F331 Major depressive disorder, recurrent, moderate: Secondary | ICD-10-CM

## 2023-07-24 NOTE — Progress Notes (Unsigned)
                Dezi Schaner, LCSW 

## 2023-07-24 NOTE — Progress Notes (Unsigned)
[facility] Counselor/Therapist Progress Note  [name], MRN: [id number],    Date: 07/24/2023  Time Spent: 2:33pm - 3:06pm : 33 minutes   Treatment Type: Individual Therapy  Reported Symptoms: Patient reported crying, difficulty sleeping, and stated, "very blah" in response to current mood  Mental Status Exam: Appearance:  Neat and Well Groomed     Behavior: Appropriate  Motor: Normal  Speech/Language:  Clear and Coherent  Affect: Tearful  Mood: Patient stated, "very blah" in response to current mood  Thought process: normal  Thought content:   WNL  Sensory/Perceptual disturbances:   WNL  Orientation: oriented to person, place, and situation  Attention: Good  Concentration: Good  Memory: WNL  Fund of knowledge:  Good  Insight:   Good  Judgment:  Good  Impulse Control: Good   Risk Assessment: Danger to Self:  No Patient denied current suicidal ideation  Self-injurious Behavior: No Danger to Others: No Patient denied current homicidal ideation Duty to Warn:no Physical Aggression / Violence:No  Access to Firearms a concern: No  Gang Involvement:No   Subjective: Patient stated, "I struggled with my homework". Patient reported difficulty identifying evidence for/against negative thoughts. Patient reported she had an argument with her mother and stated, "I really struggled with one thought".  Patient stated, "there's been some stress" in regards to events since last session. Patient stated, "I'm tired, I'm exhausted, that makes my mood not talkative, just kind of blah" in response to patient's mood since last session. Patient stated, "I just want to sleep". Patient reported she spoke with her psychiatrist about the difficulty sleeping and psychiatrist discontinued the medication dayvigo and started patient on vraylar.   Patient stated, "absolutely" in response to a referral to CBT-I provider. Patient stated, "I've been fighting wanting to cry today"  and reported the second anniversary of patient's sister's death soon.  Patient stated, "I think I'm doing a little better", "I think there's improvement" in response to patient's first long term goal. Patient reported difficulty in conversations and recalling words in a conversation. Patient stated, "I don't think any of that has improved much at all", " I still cry a lot", "I still have to work on that" in response to patient's second long term goal. Patient stated,  "I think I might need to work on that a little bit more" in response to patient's first short term goal. Patient stated,  "I think my confidence has improved" in response to patient's second short term goal. Patient provided an example of increase in confidence during a recent interaction with patient's mother and not feeling guilty in response.   Patient requested to end session early today due to the need to provide childcare.   Interventions: Cognitive Behavioral Therapy. Clinician conducted session via caregility video from clinician's home office. Patient provided verbal consent to proceed with telehealth session and participated in session from patient's home. Discussed patient's homework and barriers to patient completing. Assessed patient's mood since last session and assessed patient's current mood. Discussed patient's recent appointment with psychiatrist and changes in medications. Discussed CBT-I referral. Reviewed patient's goals and patient's progress towards goals. Clinician requested for homework patient continue thought record and to utilize socratic questions to challenge negative thoughts.     Collaboration of Care: not required at this time   Diagnosis:  Major Depressive Disorder, recurrent, moderate R/O Generalized Anxiety Disorder R/O Social Anxiety Disorder   Treatment Plan: Patient is to utilize Engineer, manufacturing systems Therapy, Dialectical Behavior Therapy Skills, and  coping strategies to help decrease symptoms  associated with Anxiety and Major Depressive Disorder.   Frequency: Weekly  Modality: individual therapy    Long-term goal:   Patient stated, "I would like to be my old confident self again" as evidence by decrease in hands shaking, knees shaking, voice quivering, and feelings of anxiety from every time there is a social interaction to 0 times when there is a social interaction    Target Date: 01/22/24  Progress: progressing    Decrease in depressive symptoms as evidence by decrease in crying, loss of interest, lack of energy, difficulty falling asleep/staying asleep from 6 to 7 days per week to 0 to 1 days per week for duration of 6 months to 1 year   Target Date: 01/22/24  Progress: progressing    Short-term goal:  Develop understanding of the relationship between loss of interest in activities to depression and anxiety and the impact on patient's thought patterns and behaviors   Target Date: 01/22/24  Progress: progressing    Develop understanding between patient's thoughts and feelings, such as, automatic negative thoughts, core beliefs, and worry on patient's level of confidence.   Target Date: 01/22/24  Progress: progressing    Doree Barthel, LCSW

## 2023-08-07 ENCOUNTER — Ambulatory Visit: Payer: 59 | Admitting: Clinical

## 2023-08-08 ENCOUNTER — Ambulatory Visit: Payer: 59 | Admitting: Internal Medicine

## 2023-08-10 ENCOUNTER — Telehealth: Payer: Self-pay | Admitting: Internal Medicine

## 2023-08-10 NOTE — Telephone Encounter (Signed)
Copied from CRM [id number]. Topic: General - Other >> Aug 10, 2023  4:36 PM Turkey B wrote: Reason for CRM: Yvonna Alanis from [employer],called in to see if any work restricitions have been placed for pt. Please cb. The ext is [phone]

## 2023-08-11 NOTE — Telephone Encounter (Signed)
Called and left Kaitlyn a VM informing her we do not complete these forms for patient. She will need to contact patient psyciatrist to discuss work restrictions.  - Sewell Pitner

## 2023-08-18 NOTE — Progress Notes (Unsigned)
Virtual Visit via Telephone Note  I connected with Toni Green on 08/22/23 at  8:30 AM EST by telephone and verified that I am speaking with the correct person using two identifiers.  Location: Patient: home Provider: office Persons participated in the visit- patient, provider    I discussed the limitations, risks, security and privacy concerns of performing an evaluation and management service by telephone and the availability of in person appointments. I also discussed with the patient that there may be a patient responsible charge related to this service. The patient expressed understanding and agreed to proceed.    I discussed the assessment and treatment plan with the patient. The patient was provided an opportunity to ask questions and all were answered. The patient agreed with the plan and demonstrated an understanding of the instructions.   The patient was advised to call back or seek an in-person evaluation if the symptoms worsen or if the condition fails to improve as anticipated.  I provided 30 minutes of non-face-to-face time during this encounter.   [name], MD    [facility] MD/PA/NP OP Progress Note  08/22/2023 9:07 AM Toni Green  MRN:  [id number]  Chief Complaint:  Chief Complaint  Patient presents with   Follow-up   HPI:  - she was found to have insufficiency fracture of right tibia   This is a follow-up appointment for depression, anxiety and insomnia.  She states that she has had increase in appetite, and has gained 20 pounds.  She eats chocolate, although she never likes it.  Although she used to skip breakfast as she does not feel hungry until 2:00, she has been all day, especially at night.  She is craving.  She denies any change in her medication other than Vraylar.  She has not noticed any change in her mood since being on this medication.  However, she was able to see her neighbor.  Although she did not like to do it, she enjoyed it afterwards.  She  is doing crafts, cooking and coloring.  She agrees that medication might have been helping her.  Although she has a tough time due to recent 2-year anniversary of loss of her sister, she made a decision to decorate for Christmas on the anniversary.  She feels much better after doing this.  She was able to connect with her therapist, who made a referral for CBT- I.  She will also do EMDR.  She states that she went to a vape shop and got CBD as she was desperate due to insomnia. Although she feels embarrassed with this, she thought she needs to share this with her providers.  She was not aware that we discussed ramelteon, and she is willing to try this medication instead of CBD.  She also agrees to cut down alcohol use to mitigate risk of insomnia.  She denies SI.    Substance use   Tobacco Alcohol Other substances/  Current denies 2-4 glasses of wine or beers,  5 days a week CBD for insomnia  CBD gummies at times  Past denies  6 pack on weekend    Past Treatment              Wt Readings from Last 3 Encounters:  06/29/23 171 lb 3.2 oz (77.7 kg)  10/03/22 162 lb (73.5 kg)  06/28/22 164 lb (74.4 kg)     Visit Diagnosis:    ICD-10-CM   1. MDD (major depressive disorder), recurrent episode, mild (HCC)  F33.0  2. Anxiety state  F41.1     3. Insomnia, unspecified type  G47.00       Past Psychiatric History: Please see initial evaluation for full details. I have reviewed the history. No updates at this time.     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   Claustrophobia    Complication of surgery 02/20/2015   Overview:  Dilated pouch found on EGD done 12/2014    Depression    History of kidney stones    Hypertension    Hypoglycemia    Hypothyroidism     Inflammation of sacroiliac joint (HCC) 08/22/2014   Pneumonia 03/2020   PONV (postoperative nausea and vomiting)    NASUEATED/BAD HEADACHES   Seizures (HCC) 1985   x1, after labor/childbirth   Thyroid disease    Wears glasses     Past Surgical History:  Procedure Laterality Date   APPENDECTOMY  2010   BACK SURGERY  10/2005   lumbar   COLONOSCOPY WITH PROPOFOL N/A 09/23/2016   Procedure: COLONOSCOPY WITH PROPOFOL;  Surgeon: Midge Minium, MD;  Location: [facility];  Service: Endoscopy;  Laterality: N/A;   COLOSTOMY  10/22/2021   CYSTOSCOPY     x 1   EVALUATION UNDER ANESTHESIA WITH HEMORRHOIDECTOMY N/A 07/06/2020   Procedure: EXAM UNDER ANESTHESIA WITH HEMORRHOIDECTOMY;  Surgeon: Duanne Guess, MD;  Location: [facility];  Service: General;  Laterality: N/A;   GALLBLADDER SURGERY  2009   lap   GASTRIC BYPASS  2008   roux en y   MUCOSAL ADVANCEMENT FLAP N/A 12/10/2020   Procedure: MUCOSAL ADVANCEMENT FLAP;  Surgeon: Romie Levee, MD;  Location: [facility];  Service: General;  Laterality: N/A;  60 MIN   POLYPECTOMY  09/23/2016   Procedure: POLYPECTOMY;  Surgeon: Midge Minium, MD;  Location: [facility];  Service: Endoscopy;;   TOTAL VAGINAL HYSTERECTOMY  1999    Family Psychiatric History: Please see initial evaluation for full details. I have reviewed the history. No updates at this time.     Family History:  Family History  Problem Relation Age of Onset   Depression Mother    Hypertension Mother    Depression Father    Hypertension Father    Depression Sister    Breast cancer Sister 90   Hypertension Maternal Grandfather    Depression Maternal Grandmother    Hypertension Maternal Grandmother    Hypertension Paternal Grandfather    Hypertension Paternal Grandmother     Social History:  Social History   Socioeconomic History   Marital status: Married    Spouse name: Not on file   Number of children: Not on file   Years of  education: Not on file   Highest education level: Associate degree: occupational, Scientist, product/process development, or vocational program  Occupational History   Not on file  Tobacco Use   Smoking status: Never   Smokeless tobacco: Never  Vaping Use   Vaping status: Never Used  Substance and Sexual Activity   Alcohol use: Yes    Alcohol/week: 0.0 standard drinks of alcohol    Comment: RARE   Drug use: No   Sexual activity: Not Currently  Other Topics Concern   Not on  file  Social History Narrative   Not on file   Social Determinants of Health   Financial Resource Strain: Medium Risk (07/04/2023)   Overall Financial Resource Strain (CARDIA)    Difficulty of Paying Living Expenses: Somewhat hard  Food Insecurity: No Food Insecurity (07/04/2023)   Hunger Vital Sign    Worried About Running Out of Food in the Last Year: Never true    Ran Out of Food in the Last Year: Never true  Transportation Needs: No Transportation Needs (07/04/2023)   PRAPARE - Administrator, Civil Service (Medical): No    Lack of Transportation (Non-Medical): No  Physical Activity: Unknown (07/04/2023)   Exercise Vital Sign    Days of Exercise per Week: 0 days    Minutes of Exercise per Session: Not on file  Stress: Stress Concern Present (07/04/2023)   [employer] of Occupational Health - Occupational Stress Questionnaire    Feeling of Stress : Very much  Social Connections: Moderately Integrated (07/04/2023)   Social Connection and Isolation Panel [NHANES]    Frequency of Communication with Friends and Family: Three times a week    Frequency of Social Gatherings with Friends and Family: Never    Attends Religious Services: Never    Database administrator or Organizations: Yes    Attends Banker Meetings: Never    Marital Status: Married    Allergies:  Allergies  Allergen Reactions   Losartan Palpitations    Could feel heartbeat (strongly) in neck   Penicillins Rash   Tape Rash    Skin  irritation after back surgery white paper tape    Metabolic Disorder Labs: No results found for: "HGBA1C", "MPG" No results found for: "PROLACTIN" Lab Results  Component Value Date   CHOL 225 (H) 02/01/2022   TRIG 67 02/01/2022   HDL 119 02/01/2022   CHOLHDL 1.9 02/01/2022   LDLCALC 94 02/01/2022   LDLCALC 126 (H) 01/07/2021   Lab Results  Component Value Date   TSH 2.330 02/01/2022   TSH 1.360 01/07/2021    Therapeutic Level Labs: No results found for: "LITHIUM" No results found for: "VALPROATE" No results found for: "CBMZ"  Current Medications: Current Outpatient Medications  Medication Sig Dispense Refill   Brexpiprazole (REXULTI) 0.5 MG TABS Take 1 tablet (0.5 mg total) by mouth at bedtime. 90 tablet 0   Ascorbic Acid (VITAMIN C) 1000 MG tablet Take 1,000 mg by mouth at bedtime.     Blood Glucose Monitoring Suppl (ONETOUCH VERIO REFLECT) w/Device KIT 1 each by Does not apply route daily at 6 (six) AM. 1 kit 0   busPIRone (BUSPAR) 7.5 MG tablet Take 1 tablet (7.5 mg total) by mouth 2 (two) times daily. 180 tablet 1   cetirizine (ZYRTEC) 10 MG tablet Take 10 mg by mouth daily.     Cholecalciferol (VITAMIN D-3) 125 MCG (5000 UT) TABS Take 5,000 Units by mouth at bedtime.     Cyanocobalamin (VITAMIN B-12) 2500 MCG SUBL Place 2,500 mcg under the tongue at bedtime.     desvenlafaxine (PRISTIQ) 100 MG 24 hr tablet Take 1 tablet (100 mg total) by mouth daily. 90 tablet 0   ferrous sulfate 325 (65 FE) MG tablet Take 325 mg by mouth at bedtime.     fluticasone (FLONASE) 50 MCG/ACT nasal spray Place 2 sprays into both nostrils daily. 16 g 6   furosemide (LASIX) 20 MG tablet TAKE 1 TABLET DAILY (Patient taking differently: Take 20 mg by mouth daily  as needed (fluid retention.).) 90 tablet 3   glucose blood test strip Use as instructed qid 100 each 12   ibuprofen (ADVIL) 800 MG tablet Take 1 tablet (800 mg total) by mouth every 8 (eight) hours as needed. 30 tablet 0   irbesartan  (AVAPRO) 300 MG tablet TAKE 1 TABLET BY MOUTH DAILY (Patient taking differently: Take 150 mg by mouth daily.) 90 tablet 1   levothyroxine (SYNTHROID) 75 MCG tablet TAKE 1 TABLET BY MOUTH DAILY  BEFORE BREAKFAST 90 tablet 0   magnesium oxide (MAG-OX) 400 MG tablet Take 400 mg by mouth at bedtime.     methocarbamol (ROBAXIN) 500 MG tablet 1/2-1 po qHS prn     ondansetron (ZOFRAN) 8 MG tablet Take 8 mg by mouth every 8 (eight) hours as needed for nausea or vomiting.     OneTouch Delica Lancets 30G MISC 1 each by Does not apply route 4 (four) times daily as needed. 100 each 12   prazosin (MINIPRESS) 2 MG capsule TAKE 1 CAPSULE BY MOUTH AT  BEDTIME (Patient not taking: Reported on 06/29/2023) 90 capsule 3   promethazine (PHENERGAN) 12.5 MG tablet Take 12.5 mg by mouth every 6 (six) hours as needed for nausea or vomiting.     propranolol (INDERAL) 20 MG tablet Take 1 tablet (20 mg total) by mouth daily as needed. for anxiety 90 tablet 0   ramelteon (ROZEREM) 8 MG tablet Take 1 tablet (8 mg total) by mouth at bedtime. 30 tablet 1   rOPINIRole (REQUIP) 4 MG tablet TAKE 1 TABLET BY MOUTH AT  BEDTIME 90 tablet 3   tamsulosin (FLOMAX) 0.4 MG CAPS capsule Take 1 capsule (0.4 mg total) by mouth daily as needed. (Patient taking differently: Take 0.4 mg by mouth daily as needed (Kidney stones).) 30 capsule 2   topiramate (TOPAMAX) 100 MG tablet Take 2 tablets (200 mg total) by mouth at bedtime. 180 tablet 1   traMADol (ULTRAM) 50 MG tablet Take 25-50 mg by mouth 2 (two) times daily as needed (back pain.).      valACYclovir (VALTREX) 1000 MG tablet as needed.     No current facility-administered medications for this visit.     Musculoskeletal: Strength & Muscle Tone:  N/A Gait & Station:  N/A Patient leans: N/A  Psychiatric Specialty Exam: Review of Systems  Psychiatric/Behavioral:  Positive for dysphoric mood and sleep disturbance. Negative for agitation, behavioral problems, confusion, decreased  concentration, hallucinations, self-injury and suicidal ideas. The patient is nervous/anxious. The patient is not hyperactive.   All other systems reviewed and are negative.   There were no vitals taken for this visit.There is no height or weight on file to calculate BMI.  General Appearance: Well Groomed  Eye Contact:  Good  Speech:  Clear and Coherent  Volume:  Normal  Mood:   same  Affect:  Appropriate, Congruent, and Full Range  Thought Process:  Coherent  Orientation:  Full (Time, Place, and Person)  Thought Content: Logical   Suicidal Thoughts:  No  Homicidal Thoughts:  No  Memory:  Immediate;   Good  Judgement:  Good  Insight:  Good  Psychomotor Activity:  Normal  Concentration:  Concentration: Good and Attention Span: Good  Recall:  Good  Fund of Knowledge: Good  Language: Good  Akathisia:  No  Handed:  Right  AIMS (if indicated): not done  Assets:  Communication Skills Desire for Improvement  ADL's:  Intact  Cognition: WNL  Sleep:  Poor   Screenings: AUDIT  Flowsheet Row Appointment from 08/08/2023 in [facility] at [facility] Most recent reading at 07/04/2023  2:07 PM Appointment from 07/05/2023 in [facility] Medicine at [facility] Most recent reading at 07/04/2023  2:07 PM  Alcohol Use Disorder Identification Test Final Score (AUDIT) 9  9       GAD-7    Flowsheet Row Office Visit from 06/29/2023 in [facility] Office Visit from 10/03/2022 in [facility] Primary [facility] & [facility] Medicine at [facility] Office Visit from 06/28/2022 in [facility] Office Visit from 06/22/2022 in [facility] Primary Care & Sports Medicine at [facility] Office Visit from 05/26/2022 in [facility]  Total GAD-7 Score 21 21 21 21 21       PHQ2-9    Flowsheet Row Office Visit from 06/29/2023 in  [facility] Office Visit from 10/03/2022 in [facility] at [facility] Office Visit from 06/28/2022 in [facility] Office Visit from 06/22/2022 in [facility] at [facility] Office Visit from 05/26/2022 in [facility]  PHQ-2 Total Score 6 6 6 6 6   PHQ-9 Total Score 23 22 24 21 24       Flowsheet Row Office Visit from 06/28/2022 in [facility] Office Visit from 05/26/2022 in [facility] Office Visit from 05/05/2022 in [facility]  C-SSRS RISK CATEGORY No Risk No Risk No Risk        Assessment and Plan:  Toni Green is a 62 y.o. 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   1. MDD (major depressive disorder), recurrent episode, mild (HCC) 2. 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  Unfortunately, she had increase in appetite, which is likely adverse reaction from this medication.  Will try switching to rexulti as adjunctive treatment for depression.  Discussed potential  metabolic side effect, EPS and QTc prolongation.  Will continue Pristiq given her strong preference to stay on this medication.  Will continue buspar, and propranolol as needed for anxiety. Will use this  judiciously given possible dehydration from her GI symptoms.   3. Insomnia, unspecified type She reportedly was referred to a therapist for CBT-I.  She was not aware that ramelteon was prescribed.  She agrees to contact the pharmacy to start this medication for insomnia.     # Binge eating disorder Significant worsening, pain secondary to Vraylar.  This medication will be discontinued.  Will continue topiramate to target binge eating.   # Alcohol use  She continues to drink alcohol.  She is not interested in pharmacological treatment.  Will continue to  assess.    # vitamin deficiencies She stopped taking vitamins due to ongoing diarrhea.  She is at risk of vitamin deficiency especially given history of bariatric surgery.  She has an upcoming appointment with her provider, and has agreed to check those including ferritin given her fatigue, worsening in restless leg.      Plan  Continue Pristiq 100 mg daily  - optum, generic Discontinue Vraylar Start rexulti 0.5 mg daily  Continue Buspar 7.5 mg twice a day (limited benefit from uptitration) Continue propranolol 20 mg daily as needed for anxiety Continue topiramate 200 mg daily Start ramelteon 8 mg at night  Next appointment-  1/7  at 8:30 am, IP She agrees to do in person visit at least every few months (she reports concern due to financial strain) - on tramadol - EKG QTc 394 msec on 11/2020   Past trials of medication- sertraline, lexapro, venlafaxine, bupropion (palpitation), Abilify, quetiapine (eating sweets), Vraylar (good effect, but caused increase in appetite), Geodon (increase in appetite). Trazodone, Vernon Prey (severe drowsiness), lemborexant (limited benefit)    Collaboration of Care: Collaboration of Care: Other reviewed  notes in Epic  Patient/Guardian was advised Release of Information must be obtained prior to any record release in order to collaborate their care with an outside provider. Patient/Guardian was advised if they have not already done so to contact the registration department to sign all necessary forms in order for [place] to release information regarding their care.   Consent: Patient/Guardian gives verbal consent for treatment and assignment of benefits for services provided during this visit. Patient/Guardian expressed understanding and agreed to proceed.    [name], MD 08/22/2023, 9:07 AM

## 2023-08-21 ENCOUNTER — Ambulatory Visit (INDEPENDENT_AMBULATORY_CARE_PROVIDER_SITE_OTHER): Payer: 59 | Admitting: Clinical

## 2023-08-21 DIAGNOSIS — F331 Major depressive disorder, recurrent, moderate: Secondary | ICD-10-CM

## 2023-08-21 NOTE — Progress Notes (Signed)
                Dezi Schaner, LCSW 

## 2023-08-21 NOTE — Progress Notes (Signed)
[facility] Counselor/Therapist Progress Note  Patient ID: KINZIE WICKES, MRN: [id number],    Date: 08/21/2023  Time Spent: 10:31am - 11:20am : 49 minutes   Treatment Type: Individual Therapy  Reported Symptoms: Patient reported crying, sadness, decreased concentration, changes in memory, recent weight gain of 20 lbs  Mental Status Exam: Appearance:  Neat and Well Groomed     Behavior: Appropriate  Motor: Normal  Speech/Language:  Clear and Coherent  Affect: Tearful  Mood: sad  Thought process: normal  Thought content:   WNL  Sensory/Perceptual disturbances:   WNL  Orientation: oriented to person, place, and situation  Attention: Good  Concentration: Good  Memory: WNL  Fund of knowledge:  Good  Insight:   Good  Judgment:  Fair  Impulse Control: Good   Risk Assessment: Danger to Self:  No Patient denied current suicidal ideation  Self-injurious Behavior: No Danger to Others: No Patient denied current homicidal ideation Duty to Warn:no Physical Aggression / Violence:No  Access to Firearms a concern: No  Gang Involvement:No   Subjective: Patient stated, "just ok" in response to events since last session. Patient reported this past Saturday was the second anniversary of patient's sister's passing. Patient reported she started a new tradition of putting Christmas decorations up on the anniversary of her sister's passing and stated, "it definitely made the day better". Patient reported patient and sister shared "a big humongous love of the season". Patient reported she cried last week prior to the anniversary of her sister's passing. Patient stated,  "on the one hand it seems like its been forever and the other hand it seems like yesterday" and stated, its tough". Patient stated, "I don't know if its just the anticipation of the day" in response to triggers for sadness. Patient stated, "it comes in waves" in reference to sadness. Patient reported the thought of her  sister triggers crying and sadness.  Patient reported she found videos of her sister, watched the videos and stated, "it was wonderful". Patient reported she misses her sister. Patient stated, "it ended up being a good day" in reference to anniversary of sister's passing. Patient stated, "I feel at this particular moment right now as we talk, ok" in response to patient's current mood. Patient reported frequent fluctuations in mood. Patient reported she has been using a CBD vape for sleep and stated, "it makes me sleepy". Patient reported she forgot to call to schedule CBT-I appointment. Patient stated, "I do think it has gotten to that point that it's affecting my mood" in reference to lack of sleep.  Patient reported increased appetite and reported she has been craving chocolate. Patient reported she has an appointment with her psychiatrist tomorrow and plans to discussed recent weight gain with psychiatrist. Patient reported she follows up with psychiatrist every 2 months.   Interventions: Cognitive Behavioral Therapy and supportive therapy.  Clinician conducted session via caregility video from clinician's home office. Patient provided verbal consent to proceed with telehealth session and participated in session from patient's home. Reviewed events since last session. Provided supportive therapy, active listening, and validation as patient discussed the anniversary of sister's passing and patient's thoughts, feelings, behaviors in response. Assessed patient's mood. Reviewed CBT-I referral and barriers to patient scheduling an appointment with provider. Discussed patient's sleep and the impact on patient's mood. Provided psycho education related to depression, sleep and grief. Clinician requested for homework patient continue thought record and to utilize socratic questions to challenge negative thoughts.     Collaboration of  Care: not required at this time   Diagnosis:  Major Depressive Disorder,  recurrent, moderate R/O Generalized Anxiety Disorder R/O Social Anxiety Disorder   Treatment Plan: Patient is to utilize Engineer, manufacturing systems Therapy, Dialectical Behavior Therapy Skills, and coping strategies to help decrease symptoms associated with Anxiety and Major Depressive Disorder.   Frequency: Weekly  Modality: individual therapy    Long-term goal:   Patient stated, "I would like to be my old confident self again" as evidence by decrease in hands shaking, knees shaking, voice quivering, and feelings of anxiety from every time there is a social interaction to 0 times when there is a social interaction    Target Date: 01/22/24  Progress: progressing    Decrease in depressive symptoms as evidence by decrease in crying, loss of interest, lack of energy, difficulty falling asleep/staying asleep from 6 to 7 days per week to 0 to 1 days per week for duration of 6 months to 1 year   Target Date: 01/22/24  Progress: progressing    Short-term goal:  Develop understanding of the relationship between loss of interest in activities to depression and anxiety and the impact on patient's thought patterns and behaviors   Target Date: 01/22/24  Progress: progressing    Develop understanding between patient's thoughts and feelings, such as, automatic negative thoughts, core beliefs, and worry on patient's level of confidence.   Target Date: 01/22/24  Progress: progressing     Doree Barthel, LCSW

## 2023-08-22 ENCOUNTER — Telehealth (INDEPENDENT_AMBULATORY_CARE_PROVIDER_SITE_OTHER): Payer: 59 | Admitting: Psychiatry

## 2023-08-22 ENCOUNTER — Encounter: Payer: Self-pay | Admitting: Psychiatry

## 2023-08-22 DIAGNOSIS — F411 Generalized anxiety disorder: Secondary | ICD-10-CM | POA: Diagnosis not present

## 2023-08-22 DIAGNOSIS — F330 Major depressive disorder, recurrent, mild: Secondary | ICD-10-CM

## 2023-08-22 DIAGNOSIS — G4700 Insomnia, unspecified: Secondary | ICD-10-CM

## 2023-08-22 MED ORDER — REXULTI 0.5 MG PO TABS: 0.5000 mg | ORAL_TABLET | Freq: Every day | ORAL | 0 refills | Status: AC

## 2023-08-22 NOTE — Patient Instructions (Signed)
Continue Pristiq 100 mg daily Discontinue Vraylar Start rexulti 0.5 mg daily  Continue Buspar 7.5 mg twice a day  Continue propranolol 20 mg daily as needed for anxiety Continue topiramate 200 mg daily Start ramelteon 8 mg at night  Next appointment-  1/7  at 8:30 am

## 2023-09-04 ENCOUNTER — Ambulatory Visit: Payer: 59 | Admitting: Clinical

## 2023-09-04 ENCOUNTER — Other Ambulatory Visit: Payer: Self-pay | Admitting: Psychiatry

## 2023-09-10 ENCOUNTER — Other Ambulatory Visit: Payer: Self-pay | Admitting: Internal Medicine

## 2023-09-10 DIAGNOSIS — E033 Postinfectious hypothyroidism: Secondary | ICD-10-CM

## 2023-09-12 NOTE — Telephone Encounter (Signed)
Requested by interface surescripts.  Requested Prescriptions  Pending Prescriptions Disp Refills   levothyroxine (SYNTHROID) 75 MCG tablet [Pharmacy Med Name: Levothyroxine Sodium 75 MCG Oral Tablet] 90 tablet 1    Sig: TAKE 1 TABLET BY MOUTH DAILY  BEFORE BREAKFAST     Endocrinology:  Hypothyroid Agents Failed - 09/10/2023  5:47 AM      Failed - TSH in normal range and within 360 days    TSH  Date Value Ref Range Status  02/01/2022 2.330 0.450 - 4.500 uIU/mL Final         Passed - Valid encounter within last 12 months    Recent Outpatient Visits           11 months ago Cough with fever   [place] [facility], Nyoka Cowden, MD   1 year ago Essential (primary) hypertension   [place] [facility] at [facility], Nyoka Cowden, MD   1 year ago Annual physical exam   [facility] at [facility], Nyoka Cowden, MD   1 year ago Acute non-recurrent maxillary sinusitis   [place] [facility] at [facility], Nyoka Cowden, MD   2 years ago COVID-19 virus infection   [facility] at [facility], Nyoka Cowden, MD

## 2023-09-15 ENCOUNTER — Other Ambulatory Visit: Payer: Self-pay | Admitting: Psychiatry

## 2023-09-15 DIAGNOSIS — F50819 Binge eating disorder, unspecified: Secondary | ICD-10-CM

## 2023-09-18 ENCOUNTER — Ambulatory Visit (INDEPENDENT_AMBULATORY_CARE_PROVIDER_SITE_OTHER): Payer: 59 | Admitting: Clinical

## 2023-09-18 DIAGNOSIS — F331 Major depressive disorder, recurrent, moderate: Secondary | ICD-10-CM | POA: Diagnosis not present

## 2023-09-18 NOTE — Progress Notes (Signed)
[facility] Counselor/Therapist Progress Note  [name], MRN: [id number],    Date: 09/18/2023  Time Spent: 10:31am - 11:24am : 53 minutes   Treatment Type: Individual Therapy  Reported Symptoms: Patient reported difficulty sleeping, sadness, loneliness.  Mental Status Exam: Appearance:  Neat and Well Groomed     Behavior: Appropriate  Motor: Normal  Speech/Language:  Clear and Coherent  Affect: Tearful  Mood: sad  Thought process: normal  Thought content:   WNL  Sensory/Perceptual disturbances:   WNL  Orientation: oriented to person, place, and situation  Attention: Good  Concentration: Good  Memory: WNL  Fund of knowledge:  Good  Insight:   Good  Judgment:  Good  Impulse Control: Good   Risk Assessment: Danger to Self:  No Patient denied current suicidal ideation  Self-injurious Behavior: No Danger to Others: No Patient denied current homicidal ideation Duty to Warn:no Physical Aggression / Violence:No  Access to Firearms a concern: No  Gang Involvement:No   Subjective: Patient reported she cancelled her last therapy appointment due to illness. Patient stated, "I came so close to cancelling again today", "I don't want to talk", "sometimes I get in these moods and I want to withdrawal, that's kind of how I am right now, I don't want to see anybody, I don't want to talk to anybody". Patient stated,  "I felt like I had made progress". Patient reported she visited her brother in law's home for thanksgiving and spent time with her mother during the holiday. Patient reported her mother plans to spend several weeks with patient after the upcoming holiday and patient stated, "I'm dreading that". Patient reported she would feel more comfortable with her mother spending several days with patient versus several weeks. Patient stated, "its just the anticipation of is it going to happen, when is it going to happen" in reference to mother's visit. Patient  reported the anniversary of patient's sister's passing is a trigger for depressive symptoms. Patient stated, "I'm just really missing her again", "it just comes in waves". Patient reported during recent visit patient's brother in law introduced the individual he is dating to the family and patient stated, "that probably added a little bit more stress". Patient reported she wants her brother in law to be happy and stated, "it was just a little bit shocking". Patient stated,  "it kind of tugged at my heart a little bit that she had moved some stuff". Patient stated, "some times I feel like life should just stop because she's (sister) not here anymore but its not stopping".  Patient stated, "its like theres a wall that's keeping me from healing". Patient reported she feels guilty when she laughs. Patient stated,  "that's doable", "this is a good homework assignment" in response to gratitude exercise. Patient stated,  "I feel better", "I'm glad I didn't cancel" in reference to today's session. Patient stated, "my heart doesn't feel heavy".   Interventions: Cognitive Behavioral Therapy and supportive therapy . Clinician conducted session via caregility video from clinician's home office. Patient provided verbal consent to proceed with telehealth session and is aware of limitations of telephone or video visits. Patient participated in session from patient's home. Discussed patient's recent appointment cancellation. Assessed current symptoms. Reviewed events since last session. Assisted patient in exploring and identifying triggers for depressive symptoms. Explored time frame for patient's mother's visit patient would feel comfortable with and patient's thoughts/feelings associated with mother's upcoming visit. Provided supportive therapy, active listening and validation as patient discussed recent  visit to patient's brother in law's home. Provided psycho education related to grief. Provided psycho education related to  gratitude exercises. Praised patient for maintaining today's appointment and discussed patient's feelings in response. Reviewed CBT-I referral. Clinician requested for homework patient continue thought record and to utilize socratic questions to challenge negative thoughts and identify 3 areas of gratitude each day.   Collaboration of Care: not required at this time   Diagnosis:  Major Depressive Disorder, recurrent, moderate R/O Generalized Anxiety Disorder R/O Social Anxiety Disorder   Treatment Plan: Patient is to utilize Engineer, manufacturing systems Therapy, Dialectical Behavior Therapy Skills, and coping strategies to help decrease symptoms associated with Anxiety and Major Depressive Disorder.   Frequency: Weekly  Modality: individual therapy    Long-term goal:   Patient stated, "I would like to be my old confident self again" as evidence by decrease in hands shaking, knees shaking, voice quivering, and feelings of anxiety from every time there is a social interaction to 0 times when there is a social interaction    Target Date: 01/22/24  Progress: progressing    Decrease in depressive symptoms as evidence by decrease in crying, loss of interest, lack of energy, difficulty falling asleep/staying asleep from 6 to 7 days per week to 0 to 1 days per week for duration of 6 months to 1 year   Target Date: 01/22/24  Progress: progressing    Short-term goal:  Develop understanding of the relationship between loss of interest in activities to depression and anxiety and the impact on patient's thought patterns and behaviors   Target Date: 01/22/24  Progress: progressing    Develop understanding between patient's thoughts and feelings, such as, automatic negative thoughts, core beliefs, and worry on patient's level of confidence.   Target Date: 01/22/24  Progress: progressing     Doree Barthel, LCSW

## 2023-09-18 NOTE — Progress Notes (Signed)
                Dezi Schaner, LCSW 

## 2023-09-19 ENCOUNTER — Other Ambulatory Visit: Payer: Self-pay | Admitting: Internal Medicine

## 2023-09-19 ENCOUNTER — Other Ambulatory Visit: Payer: Self-pay | Admitting: Psychiatry

## 2023-09-19 DIAGNOSIS — I10 Essential (primary) hypertension: Secondary | ICD-10-CM

## 2023-09-19 DIAGNOSIS — F50819 Binge eating disorder, unspecified: Secondary | ICD-10-CM

## 2023-09-21 NOTE — Telephone Encounter (Signed)
Requested medication (s) are due for refill today - expired Rx  Requested medication (s) are on the active medication list -yes  Future visit scheduled -no  Last refill: 06/06/22 #90 1RF  Notes to clinic: fails lab protocol- over 1 year-12/28/21, no 6 month follow -up  Requested Prescriptions  Pending Prescriptions Disp Refills   irbesartan (AVAPRO) 300 MG tablet [Pharmacy Med Name: Irbesartan 300 MG Oral Tablet] 90 tablet 3    Sig: TAKE 1 TABLET BY MOUTH DAILY     Cardiovascular:  Angiotensin Receptor Blockers Failed - 09/19/2023 10:17 AM      Failed - Cr in normal range and within 180 days    Creatinine  Date Value Ref Range Status  12/28/2021 0.8 0.5 - 1.1 Final  01/28/2013 0.71 0.60 - 1.30 mg/dL Final   Creatinine, Ser  Date Value Ref Range Status  01/07/2021 0.84 0.57 - 1.00 mg/dL Final         Failed - K in normal range and within 180 days    Potassium  Date Value Ref Range Status  12/28/2021 4.1 3.5 - 5.1 mEq/L Final  01/28/2013 3.6 3.5 - 5.1 mmol/L Final         Failed - Valid encounter within last 6 months    Recent Outpatient Visits           11 months ago Cough with fever   [place] [facility], Nyoka Cowden, MD   1 year ago Essential (primary) hypertension   [place] [facility] at [facility], Nyoka Cowden, MD   1 year ago Annual physical exam   [facility] at [facility], Nyoka Cowden, MD   1 year ago Acute non-recurrent maxillary sinusitis   [place] [facility] at [facility], Nyoka Cowden, MD   2 years ago COVID-19 virus infection   [facility] at [facility], Nyoka Cowden, MD              Passed - Patient is not pregnant      Passed - Last BP in normal range    BP Readings from Last 1 Encounters:  10/03/22 124/76            Requested Prescriptions   Pending Prescriptions Disp Refills   irbesartan (AVAPRO) 300 MG tablet [Pharmacy Med Name: Irbesartan 300 MG Oral Tablet] 90 tablet 3    Sig: TAKE 1 TABLET BY MOUTH DAILY     Cardiovascular:  Angiotensin Receptor Blockers Failed - 09/19/2023 10:17 AM      Failed - Cr in normal range and within 180 days    Creatinine  Date Value Ref Range Status  12/28/2021 0.8 0.5 - 1.1 Final  01/28/2013 0.71 0.60 - 1.30 mg/dL Final   Creatinine, Ser  Date Value Ref Range Status  01/07/2021 0.84 0.57 - 1.00 mg/dL Final         Failed - K in normal range and within 180 days    Potassium  Date Value Ref Range Status  12/28/2021 4.1 3.5 - 5.1 mEq/L Final  01/28/2013 3.6 3.5 - 5.1 mmol/L Final         Failed - Valid encounter within last 6 months    Recent Outpatient Visits           11 months ago Cough with fever   [place] [facility] at [facility], Nyoka Cowden, MD   1 year ago Essential (primary) hypertension   [place] [facility] at [facility], Nyoka Cowden, MD   1 year ago Annual physical exam   [facility] at [facility], Nyoka Cowden, MD   1 year ago Acute non-recurrent maxillary sinusitis   [place] [facility] at [facility], Nyoka Cowden, MD   2 years ago COVID-19 virus infection   [facility] at [facility], Nyoka Cowden, MD              Passed - Patient is not pregnant      Passed - Last BP in normal range    BP Readings from Last 1 Encounters:  10/03/22 124/76

## 2023-09-25 ENCOUNTER — Other Ambulatory Visit: Payer: Self-pay | Admitting: Psychiatry

## 2023-09-25 ENCOUNTER — Other Ambulatory Visit: Payer: Self-pay | Admitting: Internal Medicine

## 2023-09-25 DIAGNOSIS — F50819 Binge eating disorder, unspecified: Secondary | ICD-10-CM

## 2023-09-25 DIAGNOSIS — I10 Essential (primary) hypertension: Secondary | ICD-10-CM

## 2023-09-26 NOTE — Telephone Encounter (Signed)
Requested medications are due for refill today.  yes  Requested medications are on the active medications list.  yes  Last refill. 06/06/2022 #90 1 rf  Future visit scheduled.   no  Notes to clinic.  Labs are expired. Pt need an appt.     Requested Prescriptions  Pending Prescriptions Disp Refills   irbesartan (AVAPRO) 300 MG tablet [Pharmacy Med Name: Irbesartan 300 MG Oral Tablet] 90 tablet 3    Sig: TAKE 1 TABLET BY MOUTH DAILY     Cardiovascular:  Angiotensin Receptor Blockers Failed - 09/25/2023 11:48 AM      Failed - Cr in normal range and within 180 days    Creatinine  Date Value Ref Range Status  12/28/2021 0.8 0.5 - 1.1 Final  01/28/2013 0.71 0.60 - 1.30 mg/dL Final   Creatinine, Ser  Date Value Ref Range Status  01/07/2021 0.84 0.57 - 1.00 mg/dL Final         Failed - K in normal range and within 180 days    Potassium  Date Value Ref Range Status  12/28/2021 4.1 3.5 - 5.1 mEq/L Final  01/28/2013 3.6 3.5 - 5.1 mmol/L Final         Failed - Valid encounter within last 6 months    Recent Outpatient Visits           11 months ago Cough with fever   [place] [facility] at [facility], Nyoka Cowden, MD   1 year ago Essential (primary) hypertension   [place] [facility] at [facility], Nyoka Cowden, MD   1 year ago Annual physical exam   [facility] at [facility], Nyoka Cowden, MD   1 year ago Acute non-recurrent maxillary sinusitis   [place] [facility] at [facility], Nyoka Cowden, MD   2 years ago COVID-19 virus infection   [facility] at [facility], Nyoka Cowden, MD              Passed - Patient is not pregnant      Passed - Last BP in normal range    BP Readings from Last 1 Encounters:  10/03/22 124/76

## 2023-10-16 ENCOUNTER — Ambulatory Visit (INDEPENDENT_AMBULATORY_CARE_PROVIDER_SITE_OTHER): Payer: 59 | Admitting: Clinical

## 2023-10-16 DIAGNOSIS — F331 Major depressive disorder, recurrent, moderate: Secondary | ICD-10-CM

## 2023-10-16 NOTE — Progress Notes (Signed)
                Dezi Schaner, LCSW 

## 2023-10-16 NOTE — Progress Notes (Signed)
[facility] Counselor/Therapist Progress Note  [name], MRN: [id number],    Date: 10/16/2023  Time Spent: 10:33am - 11:23am : 50 minutes   Treatment Type: Individual Therapy  Reported Symptoms: difficulty sleeping, sadness, tearful  Mental Status Exam: Appearance:  Neat and Well Groomed     Behavior: Appropriate  Motor: Normal  Speech/Language:  Clear and Coherent  Affect: Tearful  Mood: irritable and sad  Thought process: normal  Thought content:   WNL  Sensory/Perceptual disturbances:   WNL  Orientation: oriented to person, place, and situation  Attention: Good  Concentration: Good  Memory: WNL  Fund of knowledge:  Good  Insight:   Good  Judgment:  Good  Impulse Control: Good   Risk Assessment: Danger to Self:  No Patient denied current suicidal ideation  Self-injurious Behavior: No Danger to Others: No Patient denied current homicidal ideation Duty to Warn:no Physical Aggression / Violence:No  Access to Firearms a concern: No  Gang Involvement:No   Subjective: Patient reported she is not feeling well today and reported she has not felt well since the day after the holiday. Patient stated, "I just feel blah" and reported no energy. Patient stated, "I have felt a little more down lately and I think it is just because it was Christmas". Patient stated, "I haven't left the house since the day after Christmas", "that's really nothing new for me". Patient stated,  "I don't know really" in response to barriers to patient leaving the home. Patient reported she recently spent several days at her mother's home. Patient stated, "I was dreading it but it went well". Patient reported her mother would like to spend several weeks with patient in January and patient stated, "Im dreading that". Patient reported she misses her sister and stated, "I've cried a lot and I'm tired of crying". Patient reported she has not followed up to schedule an appointment  for CBTI. Patient stated,  "I dread any more doctors appointments", "I have to go to so many already". Patient stated, "it seems like I'm living at doctors appointments", "I have to schedule my life around the doctors appointments". Patient stated, "I'm in a foul mood today".  Patient reported she has observed progress in patient's knee healing, less pain, decrease in swelling, and more movement in patient's knee since attending appointments. Patient reported she plans to follow up to schedule CBTI appointment.   Interventions: Cognitive Behavioral Therapy and Motivational Interviewing. Clinician conducted session via caregility video from clinician's home office. Patient provided verbal consent to proceed with telehealth session and is aware of limitations of telephone or video visits. Patient participated in session from patient's home. Reviewed events since last session. Provided psycho education related to depressive symptoms. Assisted patient in exploring barriers to patient leaving patient's home. Assisted patient in exploring and identifying triggers for increase in depressive symptoms. Reviewed recommendation for CBTI and explored barriers to patient scheduling an appointment to begin CBTI. Assessed patient's mood since last session and current mood. Discussed EMDR. Provided psycho education related to the importance of sleep. Challenged cognitive distortions and assisted patient in reframing patient's thoughts related to medical appointments. Assisted patient in identifying positives/benefits related to attending medical appointments. Discussed patient pairing appointments with an activity patient enjoys, such as, taking herself to lunch.  Clinician requested for homework patient continue thought record and to utilize socratic questions to challenge negative thoughts and identify 3 areas of gratitude each day.    Collaboration of Care: not required at this  time   Diagnosis:  Major Depressive Disorder,  recurrent, moderate R/O Generalized Anxiety Disorder R/O Social Anxiety Disorder   Treatment Plan: Patient is to utilize Engineer, manufacturing systems Therapy, Dialectical Behavior Therapy Skills, and coping strategies to help decrease symptoms associated with Anxiety and Major Depressive Disorder.   Frequency: Weekly  Modality: individual therapy    Long-term goal:   Patient stated, "I would like to be my old confident self again" as evidence by decrease in hands shaking, knees shaking, voice quivering, and feelings of anxiety from every time there is a social interaction to 0 times when there is a social interaction    Target Date: 01/22/24  Progress: progressing    Decrease in depressive symptoms as evidence by decrease in crying, loss of interest, lack of energy, difficulty falling asleep/staying asleep from 6 to 7 days per week to 0 to 1 days per week for duration of 6 months to 1 year   Target Date: 01/22/24  Progress: progressing    Short-term goal:  Develop understanding of the relationship between loss of interest in activities to depression and anxiety and the impact on patient's thought patterns and behaviors   Target Date: 01/22/24  Progress: progressing    Develop understanding between patient's thoughts and feelings, such as, automatic negative thoughts, core beliefs, and worry on patient's level of confidence.   Target Date: 01/22/24  Progress: progressing     Doree Barthel, LCSW

## 2023-10-19 NOTE — Progress Notes (Signed)
 [facility] MD/PA/NP OP Progress Note  10/24/2023 9:15 AM Toni Green  MRN:  [id number]  Chief Complaint:  Chief Complaint  Patient presents with   Follow-up   HPI:  This is a follow-up appointment for depression, anxiety and insomnia.  She states that she had a fine holiday.  It was a little difficult as she missed her sister.  She had a good Christmas with her family, including her mother and her children.  She struggles with insomnia.  She sits in the recliner, and sleeps from 4 -8 am. She takes CBD for sleep.  She feels wide awake after 30 mins.  She is also concerned about weight gain.  Although she does not crave for chocolate anymore, she has increase in appetite.  She has limited physical activity since surgery.  Although she feels horrible, she did enjoy meeting with a neighbor.  She states that she used to be more outgoing, and she does not like the way she has been doing.  She agrees to set goal each day to some small thing accomplished.  She has started to do coloring. The patient has mood symptoms as in PHQ-9/GAD-7. She denies SI.    Wt Readings from Last 3 Encounters:  10/24/23 175 lb 6.4 oz (79.6 kg)  06/29/23 171 lb 3.2 oz (77.7 kg)  10/03/22 162 lb (73.5 kg)   06/28/22 164 lb (74.4 kg)      Substance use   Tobacco Alcohol Other substances/  Current denies 2-4 glasses of wine or beers,  5 days a week CBD for insomnia, coffee in the morning, decaf tea   CBD gummies at times  Past denies  6 pack on weekend    Past Treatment               Visit Diagnosis:    ICD-10-CM   1. Major depressive disorder, recurrent episode, moderate (HCC)  F33.1     2. MDD (major depressive disorder), recurrent episode, mild (HCC)  F33.0     3. Binge eating disorder, unspecified severity  F50.819     4. Iron deficiency  E61.1 Ferritin    5. Vitamin D  deficiency  E55.9 VITAMIN D  25 Hydroxy (Vit-D Deficiency, Fractures)      Past Psychiatric History: Please see initial evaluation  for full details. I have reviewed the history. No updates at this time.     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   Claustrophobia    Complication of surgery 02/20/2015   Overview:  Dilated pouch found on EGD done 12/2014    Depression    History of kidney stones    Hypertension    Hypoglycemia    Hypothyroidism    Inflammation of sacroiliac joint (HCC) 08/22/2014   Pneumonia 03/2020   PONV (postoperative nausea and vomiting)    NASUEATED/BAD HEADACHES   Seizures (HCC) 1985   x1, after labor/childbirth   Thyroid disease    Wears glasses     Past Surgical History:  Procedure Laterality Date   APPENDECTOMY  2010   BACK SURGERY  10/2005   lumbar   COLONOSCOPY WITH PROPOFOL  N/A 09/23/2016   Procedure: COLONOSCOPY WITH PROPOFOL ;  Surgeon: Rogelia Copping, MD;  Location: [facility];  Service: Endoscopy;  Laterality: N/A;   COLOSTOMY  10/22/2021   CYSTOSCOPY     x 1   EVALUATION UNDER ANESTHESIA WITH HEMORRHOIDECTOMY N/A 07/06/2020   Procedure: EXAM UNDER ANESTHESIA WITH HEMORRHOIDECTOMY;  Surgeon: Marolyn Nest, MD;  Location: [facility];  Service: General;  Laterality: N/A;   GALLBLADDER SURGERY  2009   lap   GASTRIC BYPASS  2008   roux en y   MUCOSAL ADVANCEMENT FLAP N/A 12/10/2020   Procedure: MUCOSAL ADVANCEMENT FLAP;  Surgeon: Debby Hila, MD;  Location: [facility];  Service: General;  Laterality: N/A;  60 MIN   POLYPECTOMY  09/23/2016   Procedure: POLYPECTOMY;  Surgeon: Rogelia Copping, MD;  Location: [facility];  Service: Endoscopy;;   TOTAL VAGINAL HYSTERECTOMY  1999    Family Psychiatric History: Please see initial evaluation for full details. I have reviewed the history. No updates at this time.     Family History:   Family History  Problem Relation Age of Onset   Depression Mother    Hypertension Mother    Depression Father    Hypertension Father    Depression Sister    Breast cancer Sister 42   Hypertension Maternal Grandfather    Depression Maternal Grandmother    Hypertension Maternal Grandmother    Hypertension Paternal Grandfather    Hypertension Paternal Grandmother     Social History:  Social History   Socioeconomic History   Marital status: Married    Spouse name: Not on file   Number of children: Not on file   Years of education: Not on file   Highest education level: Associate degree: occupational, scientist, product/process development, or vocational program  Occupational History   Not on file  Tobacco Use   Smoking status: Never   Smokeless tobacco: Never  Vaping Use   Vaping status: Never Used  Substance and Sexual Activity   Alcohol use: Yes    Alcohol/week: 0.0 standard drinks of alcohol    Comment: RARE   Drug use: No   Sexual activity: Not Currently  Other Topics Concern   Not on file  Social History Narrative   Not on file   Social Drivers of Health   Financial Resource Strain: Medium Risk (07/04/2023)   Overall Financial Resource Strain (CARDIA)    Difficulty of Paying Living Expenses: Somewhat hard  Food Insecurity: No Food Insecurity (07/04/2023)   Hunger Vital Sign    Worried About Running Out of Food in the Last Year: Never true    Ran Out of Food in the Last Year: Never true  Transportation Needs: No Transportation Needs (07/04/2023)   PRAPARE - Administrator, Civil Service (Medical): No    Lack of Transportation (Non-Medical): No  Physical Activity: Unknown (07/04/2023)   Exercise Vital Sign    Days of Exercise per Week: 0 days    Minutes of Exercise per Session: Not on file  Stress: Stress Concern Present (07/04/2023)   [employer] of Occupational Health - Occupational Stress Questionnaire    Feeling of Stress : Very much  Social Connections: Moderately  Integrated (07/04/2023)   Social Connection and Isolation Panel [NHANES]    Frequency of Communication with Friends and Family: Three times a week    Frequency of Social Gatherings with Friends and Family: Never    Attends Religious Services: Never    Database Administrator or Organizations: Yes    Attends Banker Meetings: Never  Marital Status: Married    Allergies:  Allergies  Allergen Reactions   Losartan Palpitations    Could feel heartbeat (strongly) in neck   Penicillins Rash   Tape Rash    Skin irritation after back surgery white paper tape    Metabolic Disorder Labs: No results found for: HGBA1C, MPG No results found for: PROLACTIN Lab Results  Component Value Date   CHOL 225 (H) 02/01/2022   TRIG 67 02/01/2022   HDL 119 02/01/2022   CHOLHDL 1.9 02/01/2022   LDLCALC 94 02/01/2022   LDLCALC 126 (H) 01/07/2021   Lab Results  Component Value Date   TSH 2.330 02/01/2022   TSH 1.360 01/07/2021    Therapeutic Level Labs: No results found for: LITHIUM No results found for: VALPROATE No results found for: CBMZ  Current Medications: Current Outpatient Medications  Medication Sig Dispense Refill   Blood Glucose Monitoring Suppl (ONETOUCH VERIO REFLECT) w/Device KIT 1 each by Does not apply route daily at 6 (six) AM. 1 kit 0   Brexpiprazole  (REXULTI ) 0.5 MG TABS Take 1 tablet (0.5 mg total) by mouth at bedtime. 90 tablet 0   brexpiprazole  (REXULTI ) 1 MG TABS tablet Take 1 tablet (1 mg total) by mouth daily. 30 tablet 1   busPIRone  (BUSPAR ) 7.5 MG tablet TAKE 1 TABLET BY MOUTH TWICE  DAILY 180 tablet 0   cetirizine (ZYRTEC) 10 MG tablet Take 10 mg by mouth daily.     desvenlafaxine  (PRISTIQ ) 100 MG 24 hr tablet TAKE 1 TABLET BY MOUTH DAILY 90 tablet 0   fluticasone  (FLONASE ) 50 MCG/ACT nasal spray Place 2 sprays into both nostrils daily. 16 g 6   furosemide  (LASIX ) 20 MG tablet TAKE 1 TABLET DAILY (Patient taking differently: Take 20 mg by  mouth daily as needed (fluid retention.).) 90 tablet 3   glucose blood test strip Use as instructed qid 100 each 12   ibuprofen  (ADVIL ) 800 MG tablet Take 1 tablet (800 mg total) by mouth every 8 (eight) hours as needed. 30 tablet 0   irbesartan  (AVAPRO ) 300 MG tablet TAKE 1 TABLET BY MOUTH DAILY (Patient taking differently: Take 150 mg by mouth daily.) 90 tablet 1   levothyroxine  (SYNTHROID ) 75 MCG tablet TAKE 1 TABLET BY MOUTH DAILY  BEFORE BREAKFAST 90 tablet 1   methocarbamol  (ROBAXIN ) 500 MG tablet 1/2-1 po qHS prn     ondansetron  (ZOFRAN ) 8 MG tablet Take 8 mg by mouth every 8 (eight) hours as needed for nausea or vomiting.     OneTouch Delica Lancets 30G MISC 1 each by Does not apply route 4 (four) times daily as needed. 100 each 12   prazosin  (MINIPRESS ) 2 MG capsule TAKE 1 CAPSULE BY MOUTH AT  BEDTIME 90 capsule 3   promethazine  (PHENERGAN ) 12.5 MG tablet Take 12.5 mg by mouth every 6 (six) hours as needed for nausea or vomiting.     propranolol  (INDERAL ) 20 MG tablet Take 1 tablet (20 mg total) by mouth daily as needed. for anxiety 90 tablet 0   rOPINIRole  (REQUIP ) 4 MG tablet TAKE 1 TABLET BY MOUTH AT  BEDTIME 90 tablet 3   tamsulosin  (FLOMAX ) 0.4 MG CAPS capsule Take 1 capsule (0.4 mg total) by mouth daily as needed. (Patient taking differently: Take 0.4 mg by mouth daily as needed (Kidney stones).) 30 capsule 2   topiramate  (TOPAMAX ) 100 MG tablet TAKE 2 TABLETS BY MOUTH AT  BEDTIME 180 tablet 0   traMADol (ULTRAM) 50 MG tablet Take 25-50 mg by  mouth 2 (two) times daily as needed (back pain.).      valACYclovir (VALTREX) 1000 MG tablet as needed.     Ascorbic Acid (VITAMIN C) 1000 MG tablet Take 1,000 mg by mouth at bedtime.     Cholecalciferol (VITAMIN D -3) 125 MCG (5000 UT) TABS Take 5,000 Units by mouth at bedtime.     Cyanocobalamin  (VITAMIN B-12) 2500 MCG SUBL Place 2,500 mcg under the tongue at bedtime.     ferrous sulfate 325 (65 FE) MG tablet Take 325 mg by mouth at bedtime.      magnesium oxide (MAG-OX) 400 MG tablet Take 400 mg by mouth at bedtime.     No current facility-administered medications for this visit.     Musculoskeletal: Strength & Muscle Tone: within normal limits Gait & Station: normal Patient leans: N/A  Psychiatric Specialty Exam: Review of Systems  Psychiatric/Behavioral:  Positive for dysphoric mood and sleep disturbance. Negative for agitation, behavioral problems, confusion, decreased concentration, hallucinations, self-injury and suicidal ideas. The patient is nervous/anxious. The patient is not hyperactive.   All other systems reviewed and are negative.   Blood pressure 133/85, pulse 60, temperature 98 F (36.7 C), temperature source Skin, height 5' 10 (1.778 m), weight 175 lb 6.4 oz (79.6 kg).Body mass index is 25.17 kg/m.  General Appearance: Well Groomed  Eye Contact:  Good  Speech:  Clear and Coherent  Volume:  Normal  Mood:   horrible  Affect:  Appropriate, Congruent, Tearful, and brighter  Thought Process:  Coherent  Orientation:  Full (Time, Place, and Person)  Thought Content: Logical   Suicidal Thoughts:  No  Homicidal Thoughts:  No  Memory:  Immediate;   Good  Judgement:  Good  Insight:  Good  Psychomotor Activity:  Normal, Normal tone, no rigidity, no resting/postural tremors, no tardive dyskinesia    Concentration:  Concentration: Good and Attention Span: Good  Recall:  Good  Fund of Knowledge: Good  Language: Good  Akathisia:  No  Handed:  Right  AIMS (if indicated): not done  Assets:  Communication Skills Desire for Improvement  ADL's:  Intact  Cognition: WNL  Sleep:  Poor   Screenings: AUDIT    Flowsheet Row Appointment from 08/08/2023 in [facility] at [facility] Most recent reading at 07/04/2023  2:07 PM Appointment from 07/05/2023 in [facility] at [facility] Most recent reading at 07/04/2023  2:07 PM  Alcohol Use Disorder  Identification Test Final Score (AUDIT) 9  9       GAD-7    Flowsheet Row Office Visit from 10/24/2023 in [facility] Office Visit from 06/29/2023 in [facility] Office Visit from 10/03/2022 in [facility] at [facility] Office Visit from 06/28/2022 in [facility] Office Visit from 06/22/2022 in [facility] at [facility]  Total GAD-7 Score 21 21 21 21 21       PHQ2-9    Flowsheet Row Office Visit from 10/24/2023 in [facility] Office Visit from 06/29/2023 in [facility] Office Visit from 10/03/2022 in [facility] at [facility] Office Visit from 06/28/2022 in [facility] Office Visit from 06/22/2022 in [facility] at [facility] Total Score 6 6 6  6 6  PHQ-9 Total Score 21 23 22 24 21       Flowsheet Row Office Visit from 06/28/2022 in [facility] Office Visit from 05/26/2022 in [facility] Office Visit from 05/05/2022 in [facility]  C-SSRS RISK CATEGORY No Risk No Risk No Risk        Assessment and Plan:  Toni Green is a 63 y.o. 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   1. Major depressive disorder, recurrent episode, moderate (HCC) 2. MDD (major depressive  disorder), recurrent episode, mild (HCC) Acute stressors include: conflict with her mother, who has medical condition, GI symptoms s/p colostomy reversal on Feb 23, anniversary of loss of her sister  Other stressors include: complication from surgery, unemployment, financial strain, loss of her sister in Nov 2022   History:   Exam is notable for brighter affect. Although she continues to experience grief especially around the holiday, she has started to see her neighbor, and coloring.  While she did have weight gain, she reports less craving for sweet since switching from Vraylar  to rexulti .  We uptitrate rexulti  to optimize treatment for depression.  Will monitor metabolic side effects, EPS and QTc prolongation.  Will continue Pristiq  given her strong preference to stay on this medication.  Will continue BuSpar  and propranolol  as needed for anxiety.  She is reminded again regarding possible adverse reaction of hypotension.   # Insomnia Provided psychoeducation to improve sleep hygiene.  She has an appointment for CBT I.  Given she has limited benefit from ramelteon , we will discontinue this medication.   3. Binge eating disorder, unspecified severity Slightly improving since discontinuation of Vraylar .  Will continue topiramate  to target binge eating, and weight gain associated with antipsychotic use.   4. Iron deficiency Ferritin level was normal at the previous visit.  Will recheck given she is at risk, and ongoing fatigue.   5. Vitamin D  deficiency She has history of vitamin D  deficiency.  Will recheck this.    Plan  Continue Pristiq  100 mg daily  - optum, generic Discontinue Vraylar  Increase rexulti  1 mg daily  Continue Buspar  7.5 mg twice a day (limited benefit from uptitration) Continue propranolol  20 mg daily as needed for anxiety Continue topiramate  200 mg daily Discontinue ramelteon  Obtain lab (ferritin, vitamin D ) - [facility] Next appointment-  2/18 at 8 am, IP  - on tramadol -  prazosin  2 mg every other night - EKG QTc 394 msec on 11/2020   Past trials of medication- sertraline , lexapro, venlafaxine, bupropion  (palpitation), Abilify, quetiapine  (eating sweets), Vraylar  (good effect, but caused increase in appetite), Geodon  (increase in appetite). Trazodone , ambien , Lunesta  (severe drowsiness), lemborexant  (limited benefit), ramelteon  (limited benefit)    Collaboration of Care: Collaboration of Care: Other reviewed notes in Epic  Patient/Guardian was advised Release of Information must be obtained prior to any record release in order to collaborate their care with an outside provider. Patient/Guardian was advised if they have not already done so to contact the registration department to sign all necessary forms in order for us  to release information regarding their care.   Consent: Patient/Guardian gives verbal consent for treatment and assignment of benefits for  services provided during this visit. Patient/Guardian expressed understanding and agreed to proceed.    [name], MD 10/24/2023, 9:15 AM

## 2023-10-24 ENCOUNTER — Encounter: Payer: Self-pay | Admitting: Psychiatry

## 2023-10-24 ENCOUNTER — Ambulatory Visit: Payer: 59 | Admitting: Psychiatry

## 2023-10-24 VITALS — BP 133/85 | HR 60 | Temp 98.0°F | Ht 70.0 in | Wt 175.4 lb

## 2023-10-24 DIAGNOSIS — F331 Major depressive disorder, recurrent, moderate: Secondary | ICD-10-CM

## 2023-10-24 DIAGNOSIS — F50819 Binge eating disorder, unspecified: Secondary | ICD-10-CM | POA: Diagnosis not present

## 2023-10-24 DIAGNOSIS — E611 Iron deficiency: Secondary | ICD-10-CM

## 2023-10-24 DIAGNOSIS — E559 Vitamin D deficiency, unspecified: Secondary | ICD-10-CM

## 2023-10-24 DIAGNOSIS — F330 Major depressive disorder, recurrent, mild: Secondary | ICD-10-CM | POA: Diagnosis not present

## 2023-10-24 MED ORDER — BREXPIPRAZOLE 1 MG PO TABS: 1.0000 mg | ORAL_TABLET | Freq: Every day | ORAL | 1 refills | Status: AC

## 2023-10-24 NOTE — Patient Instructions (Addendum)
 Continue Pristiq  100 mg daily   Increase rexulti  1 mg daily  Continue Buspar  7.5 mg twice a day Continue propranolol  20 mg daily as needed for anxiety Continue topiramate  200 mg daily Discontinue ramelteon  Obtain lab (ferritin, vitamin D ) Next appointment-  2/18 at 8 am

## 2023-10-25 ENCOUNTER — Encounter: Payer: Self-pay | Admitting: Orthopedic Surgery

## 2023-10-25 ENCOUNTER — Encounter: Payer: Self-pay | Admitting: Psychiatry

## 2023-10-25 LAB — VITAMIN D 25 HYDROXY (VIT D DEFICIENCY, FRACTURES): Vit D, 25-Hydroxy: 34 ng/mL (ref 30.0–100.0)

## 2023-10-25 LAB — FERRITIN: Ferritin: 25 ng/mL (ref 15–150)

## 2023-10-25 NOTE — Progress Notes (Signed)
 Please contact the patient. The ferritin level is below the recommended range, and the vitamin D level is slightly lower than optimal. I recommend that she consult her primary care provider for further evaluation and guidance.

## 2023-10-25 NOTE — Progress Notes (Signed)
 Called patient as instructed by provider to discuss the lab results which indicated that the  ferritin level is below the recommended range, and the vitamin D  level is slightly lower than optimal. Made patient aware that Toni Green should consult with her primary care provider for further evaluation and guidance per the provider, no answer left voicemail for patient to return the call to the office number provided [phone]

## 2023-10-30 ENCOUNTER — Telehealth: Payer: Self-pay

## 2023-10-30 ENCOUNTER — Other Ambulatory Visit: Payer: Self-pay | Admitting: Orthopedic Surgery

## 2023-10-30 ENCOUNTER — Ambulatory Visit: Payer: 59 | Admitting: Clinical

## 2023-10-30 DIAGNOSIS — S83509A Sprain of unspecified cruciate ligament of unspecified knee, initial encounter: Secondary | ICD-10-CM

## 2023-10-30 DIAGNOSIS — M1711 Unilateral primary osteoarthritis, right knee: Secondary | ICD-10-CM

## 2023-10-30 DIAGNOSIS — S83411A Sprain of medial collateral ligament of right knee, initial encounter: Secondary | ICD-10-CM

## 2023-10-30 NOTE — Telephone Encounter (Signed)
 left message for pt to call office back about labwork results and to also see when her appt is for her pcp

## 2023-10-30 NOTE — Progress Notes (Signed)
 Called patient several times to make patient aware of lab results no answer left voicemail for patient to return call to office

## 2023-10-30 NOTE — Telephone Encounter (Signed)
-----   Message from [place] sent at 10/25/2023  7:59 AM EST ----- Please contact the patient. The ferritin level is below the recommended range, and the vitamin D  level is slightly lower than optimal. I recommend that she consult her primary care provider for further evaluation and guidance.

## 2023-10-31 ENCOUNTER — Encounter: Payer: Self-pay | Admitting: Orthopedic Surgery

## 2023-11-01 ENCOUNTER — Encounter: Payer: Self-pay | Admitting: Orthopedic Surgery

## 2023-11-04 ENCOUNTER — Ambulatory Visit
Admission: RE | Admit: 2023-11-04 | Discharge: 2023-11-04 | Disposition: A | Discharge status: Home / Self Care | Payer: 59 | Source: Ambulatory Visit | Attending: Orthopedic Surgery | Admitting: Orthopedic Surgery

## 2023-11-04 DIAGNOSIS — S83411A Sprain of medial collateral ligament of right knee, initial encounter: Secondary | ICD-10-CM

## 2023-11-04 DIAGNOSIS — S83509A Sprain of unspecified cruciate ligament of unspecified knee, initial encounter: Secondary | ICD-10-CM

## 2023-11-04 DIAGNOSIS — M1711 Unilateral primary osteoarthritis, right knee: Secondary | ICD-10-CM

## 2023-11-09 ENCOUNTER — Ambulatory Visit: Payer: 59 | Admitting: Psychology

## 2023-11-09 DIAGNOSIS — F5105 Insomnia due to other mental disorder: Secondary | ICD-10-CM

## 2023-11-09 DIAGNOSIS — F331 Major depressive disorder, recurrent, moderate: Secondary | ICD-10-CM
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
## 2023-11-20 ENCOUNTER — Ambulatory Visit (INDEPENDENT_AMBULATORY_CARE_PROVIDER_SITE_OTHER): Payer: 59 | Admitting: Psychology

## 2023-11-20 DIAGNOSIS — F331 Major depressive disorder, recurrent, moderate: Secondary | ICD-10-CM | POA: Diagnosis not present

## 2023-11-20 DIAGNOSIS — F5105 Insomnia due to other mental disorder: Secondary | ICD-10-CM | POA: Diagnosis not present

## 2023-11-20 NOTE — Progress Notes (Signed)
[facility] Counselor/Therapist Progress Note  [name], MRN: [id number],    Date: 11/20/2023  Time Spent: 9:00am-9:55am   55 minutes   Treatment Type: Individual Therapy  Reported Symptoms: stress, tired  Mental Status Exam: Appearance:  Casual     Behavior: Appropriate  Motor: Normal  Speech/Language:  Normal Rate  Affect: Appropriate  Mood: normal  Thought process: normal  Thought content:   WNL  Sensory/Perceptual disturbances:   WNL  Orientation: oriented to person, place, time/date, and situation  Attention: Good  Concentration: Good  Memory: WNL  Fund of knowledge:  Good  Insight:   Good  Judgment:  Good  Impulse Control: Good   Risk Assessment: Danger to Self:  No Self-injurious Behavior: No Danger to Others: No Duty to Warn:no Physical Aggression / Violence:No  Access to Firearms a concern: No  Gang Involvement:No   Subjective: Pt present for face-to-face individual therapy via video.  Pt consents to telehealth video session and is aware of limitations and benefits of virtual sessions. Location of pt: home Location of therapist: home office.   Pt completed her sleep diaries.  Analyzed and reviewed the diaries with pt.  Identified that pt's average total time in bed (or recliner) was 9.45 hours.  Pt's average total sleep time was 5.2 hours.   Pt tends to sleep the first couple of hours of the night and then ends up awake for 3-4 hours.   During that time pt engages in sleep effort and tosses and turns.  Pt vapes to go to sleep at the beginning of the night and has been doing this since last summer.   When pt has her middle of the night awakenings she feels anxious and has a lot of worry thoughts.  Pt worries about her 71 yo mother who has health issues and lives 3 hours away from pt.   Pt thinks about her sister who passed away a couple of years ago.  Pt is still grieving and having a hard time with that loss.   Pt worries that she  can't comprehend when she reads.  She use to be an avid reader until 2 years ago.  The past two years pt has trouble concentrating and recalling words.  Addressed pt's worries and work on ways to quiet her mind at night.   Addressed what pt's sleep was like before her sleep problems began 2 years ago.   Pt was working then and usually got 6-8 hours of sleep a night and slept from about 10pm - 6 am.  She did not have trouble getting to sleep and when she got up in the middle of the night to go to the bathroom she could get back to sleep.  Pt was a stomach sleeper. Pt is distressed about her current sleep problems.    Educated pt about the science of sleep and worked with her on developing a behavioral sleep prescription.  Gave pt the sleep prescription to have a bedtime of 1:00am and a rise time of 7:00am.   Had pt identify a "nest" she can retreat to during middle of the night awakenings.  She will go to her office which is cozy and engage in coloring or crossword puzzles.  When pt starts to feel sleepy she will return to bed.   Encouraged pt to Not nap during the day.   Pt states she will work hard to adhere to these suggestions.  Plan to meet in 2 weeks.  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   Pt participated in setting treatment goals.   Pt wants to improve sleep and concentration.   Plan to meet every two weeks.  Pt is in agreement with treatment plan.    Treatment Plan Client Abilities/Strengths  Pt is bright, engaged, and motivated for therapy.  Client Treatment Preferences  individual therapy  Client Statement of Needs  Improve sleep and coping skills.  Symptoms  Complains of difficulty falling asleep. Complains of difficulty remaining asleep. Problems Addressed  Sleep Disturbance Goals 1. Feel refreshed and energetic during wakeful hours. 2. Restore restful sleep  pattern. Objective Learn and implement stimulus control strategies to establish a consistent sleep-wake rhythm. Target Date: 2024-11-08 Frequency: Biweekly Progress: 10 Modality: individual Related Interventions 1. Discuss with the client the rationale for stimulus control strategies to establish a consistent sleep-wake cycle (see Behavioral Treatments for Sleep Disorders by Clarisa Kindred, and Kem Kays). 2. Teach the client stimulus control techniques (e.g., lie down to sleep only when sleepy; do not use the bed for activities like watching television, reading, listening to music, but only for sleep or sexual activity; get out of bed if sleep doesn't arrive soon after retiring; lie back down when sleepy; set alarm to the same wake-up time every morning regardless of sleep time or quality; do not nap during the day); assign consistent implementation. 3. Instruct the client to move activities associated with arousal and activation from the bedtime ritual to other times during the day (e.g., reading stimulating content, reviewing day's events, planning for next day, watching disturbing television). 4. Monitor the client's sleep patterns and compliance with stimulus control instructions; problem-solve obstacles and reinforce successful, consistent implementation. Objective Learn and implement a sleep restriction method to increase sleep efficiency. Target Date: 2024-11-08 Frequency: Biweekly Progress: 10 Modality: individual Related Interventions 5. Use a sleep restriction therapy approach in which the amount of time in bed is reduced to match the amount of time the patient typically sleeps (e.g., from 8 hours to 5), thus inducing systematic sleep deprivation; periodically adjust sleep time upward until an optimal sleep duration is reached. Objective Learn and implement calming skills for use at bedtime. Target Date: 2024-11-08 Frequency: Biweekly Progress: 10 Modality: individual Related  Interventions 6. Teach the client relaxation skills (e.g., progressive muscle relaxation, guided imagery, slow diaphragmatic breathing); teach the client how to apply these skills to facilitate relaxation and sleep at bedtime (see "Bedtime Relaxation Techniques" by Cherie Dark). 7. Refer the client for or conduct biofeedback training to strengthen the client's successful relaxation response. Objective Practice good sleep hygiene. Target Date: 2024-11-08 Frequency: Daily Progress: 10 Modality: individual Related Interventions 8. Instruct the client in sleep hygiene practices such as restricting excessive liquid intake, spicy late night snacks, or heavy evening meals; exercising regularly, but not within 3 - 4 hours of bedtime; minimizing or avoiding caffeine, alcohol, tobacco, and stimulant intake (or assign "Sleep Pattern Record" in the Adult Psychotherapy Homework Planner by [facility]). Diagnosis Primary Insomnia  Conditions For Discharge Achievement of treatment goals and objectives  Salomon Fick, LCSW

## 2023-11-27 ENCOUNTER — Other Ambulatory Visit: Payer: Self-pay | Admitting: Internal Medicine

## 2023-11-27 DIAGNOSIS — I10 Essential (primary) hypertension: Secondary | ICD-10-CM

## 2023-11-27 DIAGNOSIS — G2581 Restless legs syndrome: Secondary | ICD-10-CM

## 2023-11-28 NOTE — Telephone Encounter (Signed)
Requested medication (s) are due for refill today - expired Rx  Requested medication (s) are on the active medication list -yes  Future visit scheduled -no  Last refill: 06/06/22 #90 1RF  Notes to clinic: Attempted to call patient to schedule appointment- left message to call office. Expired Rx- sent for PCP review   Requested Prescriptions  Pending Prescriptions Disp Refills   irbesartan (AVAPRO) 300 MG tablet [Pharmacy Med Name: Irbesartan 300 MG Oral Tablet] 90 tablet 3    Sig: TAKE 1 TABLET BY MOUTH DAILY     Cardiovascular:  Angiotensin Receptor Blockers Failed - 11/28/2023  3:55 PM      Failed - Cr in normal range and within 180 days    Creatinine  Date Value Ref Range Status  12/28/2021 0.8 0.5 - 1.1 Final  01/28/2013 0.71 0.60 - 1.30 mg/dL Final   Creatinine, Ser  Date Value Ref Range Status  01/07/2021 0.84 0.57 - 1.00 mg/dL Final         Failed - K in normal range and within 180 days    Potassium  Date Value Ref Range Status  12/28/2021 4.1 3.5 - 5.1 mEq/L Final  01/28/2013 3.6 3.5 - 5.1 mmol/L Final         Failed - Valid encounter within last 6 months    Recent Outpatient Visits           1 year ago Cough with fever   [place] [facility] at [facility], Nyoka Cowden, MD   1 year ago Essential (primary) hypertension   [place] [facility] at [facility], Nyoka Cowden, MD   1 year ago Annual physical exam   [facility] at [facility], Nyoka Cowden, MD   1 year ago Acute non-recurrent maxillary sinusitis   [place] [facility] at [facility], Nyoka Cowden, MD   2 years ago COVID-19 virus infection   [facility], Nyoka Cowden, MD              Passed - Patient is not pregnant      Passed - Last BP in normal range    BP Readings from Last 1 Encounters:  10/03/22 124/76          Signed Prescriptions Disp Refills   rOPINIRole (REQUIP) 4 MG tablet 30 tablet 0    Sig: TAKE 1 TABLET BY MOUTH AT  BEDTIME     Neurology:  Parkinsonian Agents Failed - 11/28/2023  3:55 PM      Failed - Valid encounter within last 12 months    Recent Outpatient Visits           1 year ago Cough with fever   [place] [facility] at [facility], Nyoka Cowden, MD   1 year ago Essential (primary) hypertension   [place] [facility] at [facility], Nyoka Cowden, MD   1 year ago Annual physical exam   [facility] at [facility], Nyoka Cowden, MD   1 year ago Acute non-recurrent maxillary sinusitis   [place] [facility] at [facility], Nyoka Cowden, MD   2 years ago COVID-19 virus infection   [facility] at [facility], Nyoka Cowden, MD  Passed - Last BP in normal range    BP Readings from Last 1 Encounters:  10/03/22 124/76         Passed - Last Heart Rate in normal range    Pulse Readings from Last 1 Encounters:  10/03/22 82            Requested Prescriptions  Pending Prescriptions Disp Refills   irbesartan (AVAPRO) 300 MG tablet [Pharmacy Med Name: Irbesartan 300 MG Oral Tablet] 90 tablet 3    Sig: TAKE 1 TABLET BY MOUTH DAILY     Cardiovascular:  Angiotensin Receptor Blockers Failed - 11/28/2023  3:55 PM      Failed - Cr in normal range and within 180 days    Creatinine  Date Value Ref Range Status  12/28/2021 0.8 0.5 - 1.1 Final  01/28/2013 0.71 0.60 - 1.30 mg/dL Final   Creatinine, Ser  Date Value Ref Range Status  01/07/2021 0.84 0.57 - 1.00 mg/dL Final         Failed - K in normal range and within 180 days    Potassium  Date Value Ref Range Status  12/28/2021 4.1 3.5 - 5.1 mEq/L Final  01/28/2013 3.6 3.5 - 5.1 mmol/L Final         Failed - Valid encounter  within last 6 months    Recent Outpatient Visits           1 year ago Cough with fever   [place] [facility] at [facility], Nyoka Cowden, MD   1 year ago Essential (primary) hypertension   [place] [facility] at [facility], Nyoka Cowden, MD   1 year ago Annual physical exam   [facility] at [facility], Nyoka Cowden, MD   1 year ago Acute non-recurrent maxillary sinusitis   [place] [facility] at [facility], Nyoka Cowden, MD   2 years ago COVID-19 virus infection   [facility] at [facility], Nyoka Cowden, MD              Passed - Patient is not pregnant      Passed - Last BP in normal range    BP Readings from Last 1 Encounters:  10/03/22 124/76         Signed Prescriptions Disp Refills   rOPINIRole (REQUIP) 4 MG tablet 30 tablet 0    Sig: TAKE 1 TABLET BY MOUTH AT  BEDTIME     Neurology:  Parkinsonian Agents Failed - 11/28/2023  3:55 PM      Failed - Valid encounter within last 12 months    Recent Outpatient Visits           1 year ago Cough with fever   [place] [facility] at [facility], Nyoka Cowden, MD   1 year ago Essential (primary) hypertension   [place] [facility] at [facility], Nyoka Cowden, MD   1 year ago Annual physical exam   [facility] at [facility], Nyoka Cowden, MD   1 year ago Acute non-recurrent maxillary sinusitis   [place] [facility] at [facility], Nyoka Cowden, MD   2 years ago COVID-19 virus infection   [facility] at [facility], Nyoka Cowden,  MD              Passed - Last BP in normal range    BP Readings from Last 1 Encounters:  10/03/22 124/76         Passed - Last Heart Rate  in normal range    Pulse Readings from Last 1 Encounters:  10/03/22 82

## 2023-11-28 NOTE — Telephone Encounter (Signed)
Attempted to call patient to schedule appointment- no answer- left message to call office- courtesy 30 day Rx given Requested Prescriptions  Pending Prescriptions Disp Refills   rOPINIRole (REQUIP) 4 MG tablet [Pharmacy Med Name: ROPINIROLE  4MG   TAB] 90 tablet 3    Sig: TAKE 1 TABLET BY MOUTH AT  BEDTIME     Neurology:  Parkinsonian Agents Failed - 11/28/2023  3:52 PM      Failed - Valid encounter within last 12 months    Recent Outpatient Visits           1 year ago Cough with fever   [place] [facility] at [facility], Nyoka Cowden, MD   1 year ago Essential (primary) hypertension   [place] [facility] at [facility], Nyoka Cowden, MD   1 year ago Annual physical exam   [facility] at [facility], Nyoka Cowden, MD   1 year ago Acute non-recurrent maxillary sinusitis   [place] [facility] at [facility], Nyoka Cowden, MD   2 years ago COVID-19 virus infection   [facility] Medicine at [facility], Nyoka Cowden, MD              Passed - Last BP in normal range    BP Readings from Last 1 Encounters:  10/03/22 124/76         Passed - Last Heart Rate in normal range    Pulse Readings from Last 1 Encounters:  10/03/22 82          irbesartan (AVAPRO) 300 MG tablet [Pharmacy Med Name: Irbesartan 300 MG Oral Tablet] 30 tablet 0    Sig: TAKE 1 TABLET BY MOUTH DAILY     Cardiovascular:  Angiotensin Receptor Blockers Failed - 11/28/2023  3:52 PM      Failed - Cr in normal range and within 180 days    Creatinine  Date Value Ref Range Status  12/28/2021 0.8 0.5 - 1.1 Final  01/28/2013 0.71 0.60 - 1.30 mg/dL Final   Creatinine, Ser  Date Value Ref Range Status  01/07/2021 0.84 0.57 - 1.00 mg/dL Final         Failed - K in normal range and within 180 days    Potassium  Date Value Ref Range Status  12/28/2021  4.1 3.5 - 5.1 mEq/L Final  01/28/2013 3.6 3.5 - 5.1 mmol/L Final         Failed - Valid encounter within last 6 months    Recent Outpatient Visits           1 year ago Cough with fever   [place] [facility] at [facility], Nyoka Cowden, MD   1 year ago Essential (primary) hypertension    Primary Care & Sports Medicine at [facility], Nyoka Cowden, MD   1 year ago Annual physical exam   [facility] at [facility], Nyoka Cowden, MD   1 year ago Acute non-recurrent maxillary sinusitis    Primary Care & Sports Medicine at [facility], Nyoka Cowden, MD   2 years ago COVID-19 virus infection   [facility] at [facility], Nyoka Cowden, MD              Passed - Patient is not  pregnant      Passed - Last BP in normal range    BP Readings from Last 1 Encounters:  10/03/22 124/76

## 2023-12-05 ENCOUNTER — Ambulatory Visit: Payer: Self-pay | Admitting: Psychiatry

## 2023-12-05 ENCOUNTER — Telehealth: Payer: Self-pay | Admitting: Psychiatry

## 2023-12-05 NOTE — Telephone Encounter (Signed)
We received the disability paperwork yesterday, which was likely related to the visit originally scheduled for today. Since the visit has been rescheduled, I plan to complete the paperwork after the next appointment.  Please notify the patient, and [employer] at [phone], ext. [phone] about this update.

## 2023-12-05 NOTE — Telephone Encounter (Signed)
FYI left message with patient and also with the disability that it will be completed at her next appt. and if they needed it before the appt to please contact our office back.

## 2023-12-07 ENCOUNTER — Other Ambulatory Visit: Payer: Self-pay | Admitting: Psychiatry

## 2023-12-07 DIAGNOSIS — F50819 Binge eating disorder, unspecified: Secondary | ICD-10-CM

## 2023-12-12 ENCOUNTER — Ambulatory Visit: Payer: 59 | Admitting: Psychology

## 2023-12-20 ENCOUNTER — Other Ambulatory Visit: Payer: Self-pay | Admitting: Internal Medicine

## 2023-12-20 DIAGNOSIS — I10 Essential (primary) hypertension: Secondary | ICD-10-CM

## 2023-12-20 NOTE — Telephone Encounter (Signed)
 Requested medication (s) are due for refill today: Yes  Requested medication (s) are on the active medication list: Yes  Last refill:  11/28/23 #30, 0 refill  Future visit scheduled: Yes, 12/21/23  Notes to clinic:  Unable to refill per protocol, courtesy refill already given, routing for provider approval.      Requested Prescriptions  Pending Prescriptions Disp Refills   irbesartan (AVAPRO) 300 MG tablet [Pharmacy Med Name: Irbesartan 300 MG Oral Tablet] 30 tablet 11    Sig: TAKE 1 TABLET BY MOUTH DAILY     Cardiovascular:  Angiotensin Receptor Blockers Failed - 12/20/2023  4:29 PM      Failed - Cr in normal range and within 180 days    Creatinine  Date Value Ref Range Status  12/28/2021 0.8 0.5 - 1.1 Final  01/28/2013 0.71 0.60 - 1.30 mg/dL Final   Creatinine, Ser  Date Value Ref Range Status  01/07/2021 0.84 0.57 - 1.00 mg/dL Final         Failed - K in normal range and within 180 days    Potassium  Date Value Ref Range Status  12/28/2021 4.1 3.5 - 5.1 mEq/L Final  01/28/2013 3.6 3.5 - 5.1 mmol/L Final         Failed - Valid encounter within last 6 months    Recent Outpatient Visits           1 year ago Cough with fever   [place] [facility] at [facility], Nyoka Cowden, MD   1 year ago Essential (primary) hypertension   [place] [facility] at [facility], Nyoka Cowden, MD   1 year ago Annual physical exam   [facility], Nyoka Cowden, MD   1 year ago Acute non-recurrent maxillary sinusitis   [place] [facility] at [facility], Nyoka Cowden, MD   2 years ago COVID-19 virus infection   [facility] at [facility], Nyoka Cowden, MD              Passed - Patient is not pregnant      Passed - Last BP in normal range    BP Readings from Last 1 Encounters:  10/03/22 124/76

## 2023-12-20 NOTE — Telephone Encounter (Signed)
 Patient needs an appointment, reviewed chart and has one scheduled with PCP for 12/21/2023.

## 2023-12-21 ENCOUNTER — Encounter: Payer: Self-pay | Admitting: Internal Medicine

## 2023-12-21 ENCOUNTER — Other Ambulatory Visit: Payer: Self-pay | Admitting: Internal Medicine

## 2023-12-21 ENCOUNTER — Ambulatory Visit (INDEPENDENT_AMBULATORY_CARE_PROVIDER_SITE_OTHER): Payer: 59 | Admitting: Internal Medicine

## 2023-12-21 VITALS — BP 134/82 | HR 68 | Ht 70.0 in | Wt 177.1 lb

## 2023-12-21 DIAGNOSIS — I10 Essential (primary) hypertension: Secondary | ICD-10-CM

## 2023-12-21 DIAGNOSIS — Z0000 Encounter for general adult medical examination without abnormal findings: Secondary | ICD-10-CM | POA: Diagnosis not present

## 2023-12-21 DIAGNOSIS — E782 Mixed hyperlipidemia: Secondary | ICD-10-CM | POA: Diagnosis not present

## 2023-12-21 DIAGNOSIS — G2581 Restless legs syndrome: Secondary | ICD-10-CM

## 2023-12-21 DIAGNOSIS — Z1231 Encounter for screening mammogram for malignant neoplasm of breast: Secondary | ICD-10-CM | POA: Diagnosis not present

## 2023-12-21 DIAGNOSIS — F3341 Major depressive disorder, recurrent, in partial remission: Secondary | ICD-10-CM

## 2023-12-21 DIAGNOSIS — E033 Postinfectious hypothyroidism: Secondary | ICD-10-CM

## 2023-12-21 DIAGNOSIS — J0100 Acute maxillary sinusitis, unspecified: Secondary | ICD-10-CM

## 2023-12-21 DIAGNOSIS — Z9884 Bariatric surgery status: Secondary | ICD-10-CM | POA: Diagnosis not present

## 2023-12-21 MED ORDER — PRAZOSIN HCL 2 MG PO CAPS
2.0000 mg | ORAL_CAPSULE | Freq: Every day | ORAL | 3 refills | Status: DC
Start: 2023-12-21 — End: 2023-12-21

## 2023-12-21 MED ORDER — ROPINIROLE HCL 4 MG PO TABS: 4.0000 mg | ORAL_TABLET | Freq: Every day | ORAL | 3 refills | Status: DC

## 2023-12-21 MED ORDER — PRAZOSIN HCL 2 MG PO CAPS: 2.0000 mg | ORAL_CAPSULE | Freq: Every day | ORAL | 3 refills | Status: DC

## 2023-12-21 MED ORDER — AZITHROMYCIN 250 MG PO TABS: ORAL_TABLET | ORAL | 0 refills | Status: AC

## 2023-12-21 MED ORDER — IRBESARTAN 300 MG PO TABS: 300.0000 mg | ORAL_TABLET | Freq: Every day | ORAL | 3 refills | Status: DC

## 2023-12-21 NOTE — Assessment & Plan Note (Signed)
 Supplemented. Will get full panel to evaluate

## 2023-12-21 NOTE — Assessment & Plan Note (Signed)
 Controlled BP with normal exam. Current regimen is Avapro,prazosin and furosemide. Will continue same medications; encourage continued reduced sodium diet.

## 2023-12-21 NOTE — Assessment & Plan Note (Signed)
 Followed by Psych - on multiple medications.

## 2023-12-21 NOTE — Assessment & Plan Note (Signed)
 She is on max Requip 4 mg and still has intermittent symptoms Sometimes has UE sx as well. Will continue current dose - may need to refer to Neurology for further management

## 2023-12-21 NOTE — Assessment & Plan Note (Signed)
Managed with diet only.

## 2023-12-21 NOTE — Patient Instructions (Signed)
 Call [facility] to schedule your mammogram at [phone].

## 2023-12-21 NOTE — Progress Notes (Signed)
 Date:  12/21/2023   Name:  Toni Green   DOB:  [birth year]   MRN:  [id number]   Chief Complaint: Annual Exam and Sinus Problem (Patient said she may have a sinus infection ) Toni Green is a 63 y.o. female who presents today for her Complete Annual Exam. She feels poorly. She reports exercising none. She reports she is sleeping poorly. Breast complaints none.  Health Maintenance  Topic Date Due   COVID-19 Vaccine (1) Never done   HIV Screening  Never done   Zoster (Shingles) Vaccine (1 of 2) Never done   Mammogram  03/04/2023   Flu Shot  01/15/2024*   DTaP/Tdap/Td vaccine (2 - Td or Tdap) 05/25/2026   Colon Cancer Screening  05/16/2028   Hepatitis C Screening  Completed   HPV Vaccine  Aged Out  *Topic was postponed. The date shown is not the original due date.      Hypertension This is a chronic problem. The problem is controlled. Pertinent negatives include no chest pain, headaches, palpitations or shortness of breath. Past treatments include angiotensin blockers, diuretics and alpha 1 blockers. Identifiable causes of hypertension include a thyroid problem.  Hyperlipidemia This is a chronic problem. The problem is controlled. Pertinent negatives include no chest pain or shortness of breath. Current antihyperlipidemic treatment includes diet change.  Thyroid Problem Presents for follow-up visit. Patient reports no fatigue, palpitations or tremors. The symptoms have been stable. Her past medical history is significant for hyperlipidemia.  Sinus Problem This is a new problem. The current episode started in the past 7 days. The problem has been gradually worsening since onset. There has been no fever. The pain is moderate. Associated symptoms include congestion and sinus pressure. Pertinent negatives include no coughing, headaches or shortness of breath.    Review of Systems  Constitutional:  Negative for appetite change, fatigue, fever and unexpected weight change.  HENT:   Positive for congestion, postnasal drip (and green nasal discharge) and sinus pressure. Negative for tinnitus and trouble swallowing.   Eyes:  Negative for visual disturbance.  Respiratory:  Negative for cough, chest tightness and shortness of breath.   Cardiovascular:  Negative for chest pain, palpitations and leg swelling.  Gastrointestinal:  Negative for abdominal pain.  Endocrine: Negative for polydipsia and polyuria.  Genitourinary:  Negative for dysuria and hematuria.  Musculoskeletal:  Negative for arthralgias.  Neurological:  Negative for tremors, numbness and headaches.  Psychiatric/Behavioral:  Negative for dysphoric mood.      Lab Results  Component Value Date   NA 139 12/28/2021   K 4.1 12/28/2021   CO2 26 (A) 12/28/2021   GLUCOSE 83 01/07/2021   BUN 14 12/28/2021   CREATININE 0.8 12/28/2021   CALCIUM 9.8 01/07/2021   EGFR 90 12/28/2021   GFRNONAA 77 01/06/2020   Lab Results  Component Value Date   CHOL 225 (H) 02/01/2022   HDL 119 02/01/2022   LDLCALC 94 02/01/2022   TRIG 67 02/01/2022   CHOLHDL 1.9 02/01/2022   Lab Results  Component Value Date   TSH 2.330 02/01/2022   No results found for: "HGBA1C" Lab Results  Component Value Date   WBC 4.5 06/22/2022   HGB 13.7 06/22/2022   HCT 41.4 06/22/2022   MCV 95 06/22/2022   PLT 211 06/22/2022   Lab Results  Component Value Date   ALT 15 12/28/2021   AST 17 12/28/2021   ALKPHOS 119 12/28/2021   BILITOT 0.2 01/07/2021   Lab Results  Component Value Date   VD25OH 34.0 10/24/2023     Patient Active Problem List   Diagnosis Date Noted   Mixed hyperlipidemia 02/01/2022   Reactive hypoglycemia 12/21/2021   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   MDD (major depressive disorder), recurrent, in partial remission (HCC) 01/06/2021   S/P total hysterectomy 11/25/2020   Hemorrhoids    Anxiety state 10/14/2019   Binge eating disorder 10/14/2019   Restless leg syndrome 11/30/2018   Bright red rectal bleeding  11/30/2018   S/P gastric bypass 08/07/2017   Special screening for malignant neoplasms, colon    Benign neoplasm of ascending colon    Polyp of sigmoid colon    Menopause syndrome 07/26/2016   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   Essential (primary) hypertension 07/20/2015   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 disorder 07/20/2015   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   DDD (degenerative disc disease), cervical 03/17/2014   Calculus of kidney 02/13/2013    Allergies  Allergen Reactions   Losartan Palpitations    Could feel heartbeat (strongly) in neck   Penicillins Rash   Tape Rash    Skin irritation after back surgery white paper tape    Past Surgical History:  Procedure Laterality Date   APPENDECTOMY  2010   BACK SURGERY  10/2005   lumbar   COLONOSCOPY WITH PROPOFOL N/A 09/23/2016   Procedure: COLONOSCOPY WITH PROPOFOL;  Surgeon: Midge Minium, MD;  Location: [facility];  Service: Endoscopy;  Laterality: N/A;   COLOSTOMY  10/22/2021   CYSTOSCOPY     x 1   EVALUATION UNDER ANESTHESIA WITH HEMORRHOIDECTOMY N/A 07/06/2020   Procedure: EXAM UNDER ANESTHESIA WITH HEMORRHOIDECTOMY;  Surgeon: Duanne Guess, MD;  Location: [facility];  Service: General;  Laterality: N/A;   GALLBLADDER SURGERY  2009   lap   GASTRIC BYPASS  2008   roux en y   MUCOSAL ADVANCEMENT FLAP N/A 12/10/2020   Procedure: MUCOSAL ADVANCEMENT FLAP;  Surgeon: Romie Levee, MD;  Location: [facility];  Service: General;  Laterality: N/A;  60 MIN   POLYPECTOMY  09/23/2016   Procedure: POLYPECTOMY;  Surgeon: Midge Minium, MD;  Location: [facility];  Service: Endoscopy;;   TOTAL VAGINAL HYSTERECTOMY  1999    Social History   Tobacco Use   Smoking status: Never   Smokeless tobacco: Never  Vaping Use   Vaping status: Never Used  Substance Use Topics   Alcohol use: Yes    Alcohol/week: 0.0 standard drinks of alcohol    Comment: RARE   Drug use: No      Medication list has been reviewed and updated.  Current Meds  Medication Sig   alendronate (FOSAMAX) 70 MG tablet Take 70 mg by mouth once a week.   azithromycin (ZITHROMAX Z-PAK) 250 MG tablet UAD   Blood Glucose Monitoring Suppl (ONETOUCH VERIO REFLECT) w/Device KIT 1 each by Does not apply route daily at 6 (six) AM.   brexpiprazole (REXULTI) 1 MG TABS tablet Take 1 tablet (1 mg total) by mouth daily.   busPIRone (BUSPAR) 7.5 MG tablet TAKE 1 TABLET BY MOUTH TWICE  DAILY   calcitonin, salmon, (MIACALCIN/FORTICAL) 200 UNIT/ACT nasal spray SMARTSIG:Both Nares   cetirizine (ZYRTEC) 10 MG tablet Take 10 mg by mouth daily.   fluticasone (FLONASE) 50 MCG/ACT nasal spray Place 2 sprays into both nostrils daily.   furosemide (LASIX) 20 MG tablet TAKE 1 TABLET DAILY (Patient taking differently: Take 20 mg by mouth daily as needed (fluid retention.).)   glucose blood  test strip Use as instructed qid   ibuprofen (ADVIL) 800 MG tablet Take 1 tablet (800 mg total) by mouth every 8 (eight) hours as needed.   levothyroxine (SYNTHROID) 75 MCG tablet TAKE 1 TABLET BY MOUTH DAILY  BEFORE BREAKFAST   methocarbamol (ROBAXIN) 500 MG tablet 1/2-1 po qHS prn   OneTouch Delica Lancets 30G MISC 1 each by Does not apply route 4 (four) times daily as needed.   tamsulosin (FLOMAX) 0.4 MG CAPS capsule Take 1 capsule (0.4 mg total) by mouth daily as needed. (Patient taking differently: Take 0.4 mg by mouth daily as needed (Kidney stones).)   [START ON 12/30/2023] topiramate (TOPAMAX) 100 MG tablet Take 2 tablets (200 mg total) by mouth at bedtime.   traMADol (ULTRAM) 50 MG tablet Take 25-50 mg by mouth 2 (two) times daily as needed (back pain.).    valACYclovir (VALTREX) 1000 MG tablet as needed.   [DISCONTINUED] irbesartan (AVAPRO) 300 MG tablet TAKE 1 TABLET BY MOUTH DAILY   [DISCONTINUED] prazosin (MINIPRESS) 2 MG capsule TAKE 1 CAPSULE BY MOUTH AT  BEDTIME   [DISCONTINUED] rOPINIRole (REQUIP) 4 MG tablet TAKE  1 TABLET BY MOUTH AT  BEDTIME       12/21/2023    9:18 AM 10/24/2023    9:14 AM 06/29/2023   12:47 PM 10/03/2022   11:31 AM  GAD 7 : Generalized Anxiety Score  Nervous, Anxious, on Edge 3   3  Control/stop worrying 3   3  Worry too much - different things 3   3  Trouble relaxing 3   3  Restless 3   3  Easily annoyed or irritable 3   3  Afraid - awful might happen 3   3  Total GAD 7 Score 21   21  Anxiety Difficulty Somewhat difficult   Extremely difficult     Information is confidential and restricted. Go to Review Flowsheets to unlock data.       12/21/2023    9:16 AM 10/24/2023    9:14 AM 06/29/2023   12:46 PM  Depression screen PHQ 2/9  Decreased Interest 3    Down, Depressed, Hopeless 3    PHQ - 2 Score 6    Altered sleeping 3    Tired, decreased energy 3    Change in appetite 0    Feeling bad or failure about yourself  0    Trouble concentrating 3    Moving slowly or fidgety/restless 1    Suicidal thoughts 0    PHQ-9 Score 16    Difficult doing work/chores Not difficult at all       Information is confidential and restricted. Go to Review Flowsheets to unlock data.    BP Readings from Last 3 Encounters:  12/21/23 134/82  10/03/22 124/76  06/22/22 138/88    Physical Exam Vitals and nursing note reviewed.  Constitutional:      General: She is not in acute distress.    Appearance: She is well-developed.  HENT:     Head: Normocephalic and atraumatic.     Right Ear: Ear canal normal. Tympanic membrane is retracted. Tympanic membrane is not erythematous.     Left Ear: Ear canal normal. Tympanic membrane is retracted. Tympanic membrane is not erythematous.     Nose:     Right Sinus: Maxillary sinus tenderness present.     Left Sinus: Maxillary sinus tenderness present.     Mouth/Throat:     Pharynx: Oropharynx is clear. No oropharyngeal exudate or  posterior oropharyngeal erythema.  Eyes:     General: No scleral icterus.       Right eye: No discharge.         Left eye: No discharge.     Conjunctiva/sclera: Conjunctivae normal.  Neck:     Thyroid: No thyromegaly.     Vascular: No carotid bruit.  Cardiovascular:     Rate and Rhythm: Normal rate and regular rhythm.     Pulses: Normal pulses.     Heart sounds: Normal heart sounds.  Pulmonary:     Effort: Pulmonary effort is normal. No respiratory distress.     Breath sounds: No wheezing.  Chest:  Breasts:    Right: No mass, nipple discharge, skin change or tenderness.     Left: No mass, nipple discharge, skin change or tenderness.  Abdominal:     General: Bowel sounds are normal.     Palpations: Abdomen is soft.     Tenderness: There is no abdominal tenderness. There is no right CVA tenderness, left CVA tenderness, guarding or rebound.       Comments: Colostomy site healed  Musculoskeletal:     Cervical back: Normal range of motion. No erythema.     Right lower leg: No edema.     Left lower leg: No edema.  Lymphadenopathy:     Cervical: No cervical adenopathy.  Skin:    General: Skin is warm and dry.     Findings: No rash.  Neurological:     Mental Status: She is alert and oriented to person, place, and time.     Cranial Nerves: No cranial nerve deficit.     Sensory: No sensory deficit.     Deep Tendon Reflexes: Reflexes are normal and symmetric.  Psychiatric:        Attention and Perception: Attention normal.        Mood and Affect: Mood normal.     Wt Readings from Last 3 Encounters:  12/21/23 177 lb 2 oz (80.3 kg)  10/03/22 162 lb (73.5 kg)  06/22/22 162 lb (73.5 kg)    BP 134/82   Pulse 68   Ht 5\' 10"  (1.778 m)   Wt 177 lb 2 oz (80.3 kg)   SpO2 98%   BMI 25.41 kg/m   Assessment and Plan:  Problem List Items Addressed This Visit       Unprioritized   Essential (primary) hypertension (Chronic)   Controlled BP with normal exam. Current regimen is Avapro,prazosin and furosemide. Will continue same medications; encourage continued reduced sodium diet.        Relevant Medications   irbesartan (AVAPRO) 300 MG tablet   prazosin (MINIPRESS) 2 MG capsule   Other Relevant Orders   Comprehensive metabolic panel   CBC with Differential/Platelet   Post-infectious hypothyroidism (Chronic)   Supplemented. Will get full panel to evaluate      Relevant Orders   TSH+T4F+T3Free+ThyAbs+TPO+VD25   Restless leg syndrome (Chronic)   She is on max Requip 4 mg and still has intermittent symptoms Sometimes has UE sx as well. Will continue current dose - may need to refer to Neurology for further management       Relevant Medications   rOPINIRole (REQUIP) 4 MG tablet   MDD (major depressive disorder), recurrent, in partial remission (HCC) (Chronic)   Followed by Psych - on multiple medications.      S/P gastric bypass   No currently taking any supplements Will check thyroid, CBC and B12  Relevant Orders   Vitamin B12   Mixed hyperlipidemia   Managed with diet only.      Relevant Medications   irbesartan (AVAPRO) 300 MG tablet   prazosin (MINIPRESS) 2 MG capsule   Other Relevant Orders   Lipid panel   Other Visit Diagnoses       Annual physical exam    -  Primary   up to date except for mammogram may need to resume Multivit   Relevant Orders   Vitamin B12   Comprehensive metabolic panel   CBC with Differential/Platelet   Lipid panel     Encounter for screening mammogram for breast cancer       Relevant Orders   MM 3D SCREENING MAMMOGRAM BILATERAL BREAST     Acute non-recurrent maxillary sinusitis       continue Flonase and Zyrtec   Relevant Medications   azithromycin (ZITHROMAX Z-PAK) 250 MG tablet       Return in about 6 months (around 06/22/2024) for HTN.    Reubin Milan, MD [facility] [place]

## 2023-12-21 NOTE — Assessment & Plan Note (Signed)
 No currently taking any supplements Will check thyroid, CBC and B12

## 2023-12-24 ENCOUNTER — Other Ambulatory Visit: Payer: Self-pay | Admitting: Psychiatry

## 2023-12-26 ENCOUNTER — Encounter: Payer: Self-pay | Admitting: Internal Medicine

## 2023-12-26 LAB — COMPREHENSIVE METABOLIC PANEL
ALT: 14 IU/L (ref 0–32)
AST: 16 IU/L (ref 0–40)
Albumin: 4 g/dL (ref 3.9–4.9)
Alkaline Phosphatase: 99 IU/L (ref 44–121)
BUN/Creatinine Ratio: 15 (ref 12–28)
BUN: 10 mg/dL (ref 8–27)
Bilirubin Total: 0.3 mg/dL (ref 0.0–1.2)
CO2: 22 mmol/L (ref 20–29)
Calcium: 8.9 mg/dL (ref 8.7–10.3)
Chloride: 111 mmol/L — ABNORMAL HIGH (ref 96–106)
Creatinine, Ser: 0.67 mg/dL (ref 0.57–1.00)
Globulin, Total: 1.8 g/dL (ref 1.5–4.5)
Glucose: 89 mg/dL (ref 70–99)
Potassium: 4 mmol/L (ref 3.5–5.2)
Sodium: 144 mmol/L (ref 134–144)
Total Protein: 5.8 g/dL — ABNORMAL LOW (ref 6.0–8.5)
eGFR: 99 mL/min/{1.73_m2} (ref >59)

## 2023-12-26 LAB — LIPID PANEL
Chol/HDL Ratio: 1.8 ratio (ref 0.0–4.4)
Cholesterol, Total: 183 mg/dL (ref 100–199)
HDL: 100 mg/dL (ref >39)
LDL Chol Calc (NIH): 73 mg/dL (ref 0–99)
Triglycerides: 47 mg/dL (ref 0–149)
VLDL Cholesterol Cal: 10 mg/dL (ref 5–40)

## 2023-12-26 LAB — TSH+T4F+T3FREE+THYABS+TPO+VD25
Free T4: 1.27 ng/dL (ref 0.82–1.77)
T3, Free: 2.6 pg/mL (ref 2.0–4.4)
TSH: 3.3 u[IU]/mL (ref 0.450–4.500)
Thyroglobulin Antibody: <1 [IU]/mL (ref 0.0–0.9)
Thyroperoxidase Ab SerPl-aCnc: 31 [IU]/mL (ref 0–34)
Vit D, 25-Hydroxy: 33.6 ng/mL (ref 30.0–100.0)

## 2023-12-26 LAB — CBC WITH DIFFERENTIAL/PLATELET
Basophils Absolute: 0 10*3/uL (ref 0.0–0.2)
Basos: 1 %
EOS (ABSOLUTE): 0.1 10*3/uL (ref 0.0–0.4)
Eos: 1 %
Hematocrit: 40.2 % (ref 34.0–46.6)
Hemoglobin: 13.2 g/dL (ref 11.1–15.9)
Immature Grans (Abs): 0 10*3/uL (ref 0.0–0.1)
Immature Granulocytes: 0 %
Lymphocytes Absolute: 1.3 10*3/uL (ref 0.7–3.1)
Lymphs: 16 %
MCH: 31.3 pg (ref 26.6–33.0)
MCHC: 32.8 g/dL (ref 31.5–35.7)
MCV: 95 fL (ref 79–97)
Monocytes Absolute: 0.6 10*3/uL (ref 0.1–0.9)
Monocytes: 8 %
Neutrophils Absolute: 5.7 10*3/uL (ref 1.4–7.0)
Neutrophils: 74 %
Platelets: 263 10*3/uL (ref 150–450)
RBC: 4.22 x10E6/uL (ref 3.77–5.28)
RDW: 13.1 % (ref 11.7–15.4)
WBC: 7.7 10*3/uL (ref 3.4–10.8)

## 2023-12-26 LAB — VITAMIN B12: Vitamin B-12: 309 pg/mL (ref 232–1245)

## 2023-12-26 NOTE — Telephone Encounter (Signed)
 FYI  KP

## 2023-12-28 ENCOUNTER — Other Ambulatory Visit (HOSPITAL_COMMUNITY): Payer: Self-pay | Admitting: Psychiatry

## 2023-12-28 ENCOUNTER — Other Ambulatory Visit: Payer: Self-pay | Admitting: Internal Medicine

## 2023-12-28 ENCOUNTER — Other Ambulatory Visit: Payer: Self-pay | Admitting: Psychiatry

## 2023-12-28 DIAGNOSIS — G2581 Restless legs syndrome: Secondary | ICD-10-CM

## 2023-12-28 DIAGNOSIS — I10 Essential (primary) hypertension: Secondary | ICD-10-CM

## 2023-12-28 MED ORDER — IRBESARTAN 300 MG PO TABS: 300.0000 mg | ORAL_TABLET | Freq: Every day | ORAL | 3 refills | Status: DC

## 2023-12-28 MED ORDER — ROPINIROLE HCL 4 MG PO TABS: 4.0000 mg | ORAL_TABLET | Freq: Every day | ORAL | 3 refills | Status: DC

## 2023-12-28 MED ORDER — BREXPIPRAZOLE 1 MG PO TABS: 1.0000 mg | ORAL_TABLET | Freq: Every day | ORAL | 0 refills | Status: DC

## 2023-12-29 ENCOUNTER — Telehealth: Payer: Self-pay | Admitting: Psychiatry

## 2023-12-29 DIAGNOSIS — F331 Major depressive disorder, recurrent, moderate: Secondary | ICD-10-CM

## 2023-12-29 MED ORDER — BUSPIRONE HCL 7.5 MG PO TABS: 7.5000 mg | ORAL_TABLET | Freq: Two times a day (BID) | ORAL | 0 refills | Status: DC

## 2023-12-29 NOTE — Progress Notes (Signed)
 Virtual Visit via Video Note  I connected with Toni Green on 01/01/24 at  9:00 AM EDT by a video enabled telemedicine application and verified that I am speaking with the correct person using two identifiers.  Location: Patient: home Provider: office Persons participated in the visit- patient, provider    I discussed the limitations of evaluation and management by telemedicine and the availability of in person appointments. The patient expressed understanding and agreed to proceed.    I discussed the assessment and treatment plan with the patient. The patient was provided an opportunity to ask questions and all were answered. The patient agreed with the plan and demonstrated an understanding of the instructions.   The patient was advised to call back or seek an in-person evaluation if the symptoms worsen or if the condition fails to improve as anticipated.    Toni Hotter, MD    [facility] MD/PA/NP OP Progress Note  01/01/2024 9:39 AM Toni Green  MRN:  [id number]  Chief Complaint:  Chief Complaint  Patient presents with   Follow-up   HPI:  This is a follow-up appointment for depression, insomnia, binge eating.  She states that she has been doing the same.  However, she has started painting, which she used to enjoy.  She was able to do a coloring, and she liked it.  She wants to do those things.  Although her hand is not steady, she enjoyed them.  She continues to see her neighbor weekly.  Although she used to be outgoing and she missed it, she feels she lacks the confidence.  She has difficulty in recalling words, and feels not smart.  She was fine when she watched sports with them as you do not need to have much conversation.  She continues to miss her sister.  It still feels raw.  She feels so desperate to be better.  She has been working on sleep diary, and will be seen by a therapist soon.  She has initial and middle insomnia. She feels fatigue. She denies binge eating.  She  denies SI.  She agrees with the plan as outlined below.   Wt Readings from Last 3 Encounters:  12/21/23 177 lb 2 oz (80.3 kg)  10/24/23 175 lb 6.4 oz (79.6 kg)  06/29/23 171 lb 3.2 oz (77.7 kg)     Substance use   Tobacco Alcohol Other substances/  Current denies 1 glass of wine on weekends, used to drink 2-4 glasses of wine or beers,  5 days a week CBD for insomnia, twice since Feb, coffee in the morning, decaf tea   CBD gummies at times  Past denies  6 pack on weekend    Past Treatment             Visit Diagnosis:    ICD-10-CM   1. MDD (major depressive disorder), recurrent episode, mild (HCC)  F33.0     2. Insomnia, unspecified type  G47.00     3. Binge eating disorder, unspecified severity  F50.819     4. Iron deficiency  E61.1       Past Psychiatric History: Please see initial evaluation for full details. I have reviewed the history. No updates at this time.     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   Claustrophobia    Complication of surgery 02/20/2015   Overview:  Dilated pouch found on EGD done 12/2014    Depression    History of colostomy reversal 11/2022   History of kidney stones    Hypertension    Hypoglycemia    Hypothyroidism    Inflammation of sacroiliac joint (HCC) 08/22/2014   Pneumonia 03/2020   PONV (postoperative nausea and vomiting)    NASUEATED/BAD HEADACHES   Seizures (HCC) 1985   x1, after labor/childbirth   Thyroid disease    Wears glasses     Past Surgical History:  Procedure Laterality Date   APPENDECTOMY  2010   BACK SURGERY  10/2005   lumbar   COLONOSCOPY WITH PROPOFOL N/A 09/23/2016   Procedure: COLONOSCOPY WITH PROPOFOL;  Surgeon: Midge Minium, MD;  Location: [facility];  Service: Endoscopy;   Laterality: N/A;   COLOSTOMY  10/22/2021   CYSTOSCOPY     x 1   EVALUATION UNDER ANESTHESIA WITH HEMORRHOIDECTOMY N/A 07/06/2020   Procedure: EXAM UNDER ANESTHESIA WITH HEMORRHOIDECTOMY;  Surgeon: Duanne Guess, MD;  Location: [facility];  Service: General;  Laterality: N/A;   GALLBLADDER SURGERY  2009   lap   GASTRIC BYPASS  2008   roux en y   MUCOSAL ADVANCEMENT FLAP N/A 12/10/2020   Procedure: MUCOSAL ADVANCEMENT FLAP;  Surgeon: Romie Levee, MD;  Location: [facility];  Service: General;  Laterality: N/A;  60 MIN   POLYPECTOMY  09/23/2016   Procedure: POLYPECTOMY;  Surgeon: Midge Minium, MD;  Location: [facility];  Service: Endoscopy;;   TOTAL VAGINAL HYSTERECTOMY  1999    Family Psychiatric History: Please see initial evaluation for full details. I have reviewed the history. No updates at this time.     Family History:  Family History  Problem Relation Age of Onset   Depression Mother    Hypertension Mother    Depression Father    Hypertension Father    Depression Sister    Breast cancer Sister 36   Hypertension Maternal Grandfather    Depression Maternal Grandmother    Hypertension Maternal Grandmother    Hypertension Paternal Grandfather    Hypertension Paternal Grandmother     Social History:  Social History   Socioeconomic History   Marital status: Married    Spouse name: Not on file   Number of children: Not on file   Years of education: Not on file   Highest education level: Associate degree: occupational, Scientist, product/process development, or vocational program  Occupational History   Not on file  Tobacco Use   Smoking status: Never   Smokeless tobacco: Never  Vaping Use   Vaping status: Never Used  Substance and Sexual Activity   Alcohol use: Yes    Alcohol/week: 0.0 standard drinks of alcohol    Comment: RARE   Drug use: No   Sexual activity: Not Currently  Other Topics Concern   Not on file  Social History Narrative   Not on file    Social Drivers of Health   Financial Resource Strain: Medium Risk (07/04/2023)   Overall Financial Resource Strain (CARDIA)    Difficulty of Paying Living Expenses: Somewhat hard  Food Insecurity: No Food Insecurity (07/04/2023)   Hunger Vital Sign    Worried About Running Out of Food in the Last Year: Never true    Ran Out of Food in the Last Year: Never true  Transportation Needs: No Transportation Needs (07/04/2023)   PRAPARE - Administrator, Civil Service (Medical): No    Lack of Transportation (Non-Medical): No  Physical Activity: Unknown (07/04/2023)   Exercise Vital Sign    Days of Exercise per Week: 0 days    Minutes of Exercise per Session: Not on file  Stress: Stress Concern Present (07/04/2023)   [employer] of Occupational Health - Occupational Stress Questionnaire    Feeling of Stress : Very much  Social Connections: Moderately Integrated (07/04/2023)   Social Connection and Isolation Panel [NHANES]    Frequency of Communication with Friends and Family: Three times a week    Frequency of Social Gatherings with Friends and Family: Never    Attends Religious Services: Never    Database administrator or Organizations: Yes    Attends Banker Meetings: Never    Marital Status: Married    Allergies:  Allergies  Allergen Reactions   Losartan Palpitations    Could feel heartbeat (strongly) in neck   Penicillins Rash   Tape Rash    Skin irritation after back surgery white paper tape    Metabolic Disorder Labs: No results found for: "HGBA1C", "MPG" No results found for: "PROLACTIN" Lab Results  Component Value Date   CHOL 183 12/21/2023   TRIG 47 12/21/2023   HDL 100 12/21/2023   CHOLHDL 1.8 12/21/2023   LDLCALC 73 12/21/2023   LDLCALC 94 02/01/2022   Lab Results  Component Value Date   TSH 3.300 12/21/2023   TSH 2.330 02/01/2022    Therapeutic Level Labs: No results found for: "LITHIUM" No results found for: "VALPROATE" No  results found for: "CBMZ"  Current Medications: Current Outpatient Medications  Medication Sig Dispense Refill   alendronate (FOSAMAX) 70 MG tablet Take 70 mg by mouth once a week.     Blood Glucose Monitoring Suppl (ONETOUCH VERIO REFLECT) w/Device KIT 1 each by Does not apply route daily at 6 (six) AM. 1 kit 0   brexpiprazole (REXULTI) 1 MG TABS tablet Take 1 tablet (1 mg total) by mouth daily. 90 tablet 0   busPIRone (BUSPAR) 7.5 MG tablet Take 1 tablet (7.5 mg total) by mouth 2 (two) times daily. 180 tablet 0   calcitonin, salmon, (MIACALCIN/FORTICAL) 200 UNIT/ACT nasal spray SMARTSIG:Both Nares     cetirizine (ZYRTEC) 10 MG tablet Take 10 mg by mouth daily.     desvenlafaxine (PRISTIQ) 100 MG 24 hr tablet Take 1 tablet (100 mg total) by mouth daily. 90 tablet 0   famotidine (PEPCID) 40 MG tablet Take by mouth. (Patient not taking: Reported on 12/21/2023)     fluticasone (FLONASE) 50 MCG/ACT nasal spray Place 2 sprays into both nostrils daily. 16 g 6   furosemide (LASIX) 20 MG tablet TAKE 1 TABLET DAILY (Patient taking differently: Take 20 mg by mouth daily as needed (fluid retention.).) 90 tablet 3   glucose blood test strip Use as instructed qid 100 each 12   ibuprofen (ADVIL) 800 MG tablet Take 1 tablet (800 mg total) by mouth every 8 (eight) hours as needed. 30 tablet 0   irbesartan (AVAPRO) 300 MG tablet Take 1 tablet (300 mg total) by mouth daily. 90 tablet 3   levothyroxine (SYNTHROID) 75 MCG tablet TAKE 1 TABLET BY MOUTH DAILY  BEFORE BREAKFAST 90 tablet 1   methocarbamol (ROBAXIN) 500 MG tablet 1/2-1 po qHS prn     ondansetron (ZOFRAN) 8 MG tablet Take 8 mg by mouth every 8 (  eight) hours as needed for nausea or vomiting. (Patient not taking: Reported on 12/21/2023)     OneTouch Delica Lancets 30G MISC 1 each by Does not apply route 4 (four) times daily as needed. 100 each 12   prazosin (MINIPRESS) 2 MG capsule Take 1 capsule (2 mg total) by mouth at bedtime. 90 capsule 3   propranolol  (INDERAL) 20 MG tablet Take 1 tablet (20 mg total) by mouth daily as needed. for anxiety 90 tablet 0   rOPINIRole (REQUIP) 4 MG tablet Take 1 tablet (4 mg total) by mouth at bedtime. 90 tablet 3   tamsulosin (FLOMAX) 0.4 MG CAPS capsule Take 1 capsule (0.4 mg total) by mouth daily as needed. (Patient taking differently: Take 0.4 mg by mouth daily as needed (Kidney stones).) 30 capsule 2   topiramate (TOPAMAX) 100 MG tablet Take 2 tablets (200 mg total) by mouth at bedtime. 180 tablet 1   traMADol (ULTRAM) 50 MG tablet Take 25-50 mg by mouth 2 (two) times daily as needed (back pain.).      valACYclovir (VALTREX) 1000 MG tablet as needed.     No current facility-administered medications for this visit.     Musculoskeletal: Strength & Muscle Tone:  N/A Gait & Station:  N/A Patient leans: N/A  Psychiatric Specialty Exam: Review of Systems  Psychiatric/Behavioral:  Positive for decreased concentration, dysphoric mood and sleep disturbance. Negative for agitation, behavioral problems, confusion, hallucinations, self-injury and suicidal ideas. The patient is nervous/anxious. The patient is not hyperactive.   All other systems reviewed and are negative.   There were no vitals taken for this visit.There is no height or weight on file to calculate BMI.  General Appearance: Well Groomed  Eye Contact:  Good  Speech:  Clear and Coherent  Volume:  Normal  Mood:   same  Affect:  Appropriate, Congruent, and calmer, brighter  Thought Process:  Coherent  Orientation:  Full (Time, Place, and Person)  Thought Content: Logical   Suicidal Thoughts:  No  Homicidal Thoughts:  No  Memory:  Immediate;   Good  Judgement:  Good  Insight:  Good  Psychomotor Activity:  Normal  Concentration:  Concentration: Good and Attention Span: Good  Recall:  Good  Fund of Knowledge: Good  Language: Good  Akathisia:  No  Handed:  Right  AIMS (if indicated): not done  Assets:  Communication Skills Desire for  Improvement  ADL's:  Intact  Cognition: WNL  Sleep:  Poor   Screenings: AUDIT    Flowsheet Row Appointment from 08/08/2023 in [facility] at [facility] Most recent reading at 07/04/2023  2:07 PM Appointment from 07/05/2023 in [facility] at [facility] Most recent reading at 07/04/2023  2:07 PM  Alcohol Use Disorder Identification Test Final Score (AUDIT) 9  9       GAD-7    Flowsheet Row Office Visit from 12/21/2023 in [facility] at [facility] Office Visit from 10/24/2023 in [facility] Office Visit from 06/29/2023 in [facility] Office Visit from 10/03/2022 in [facility] at [facility] Office Visit from 06/28/2022 in [facility]  Total GAD-7 Score 21 21 21 21 21       PHQ2-9    Flowsheet Row Office Visit from 12/21/2023 in [facility] at [facility]  Visit from 10/24/2023 in [facility] Office Visit from 06/29/2023 in [facility] Office Visit from 10/03/2022 in [facility] at [facility] Office Visit from 06/28/2022 in [facility]  PHQ-2 Total Score 6 6 6 6 6   PHQ-9 Total Score 16 21 23 22 24       Flowsheet Row Office Visit from 06/28/2022 in [facility] Office Visit from 05/26/2022 in [facility] Office Visit from 05/05/2022 in [facility]  C-SSRS RISK CATEGORY No Risk No Risk No Risk        Assessment and Plan:  Toni Green is a 63 y.o. 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   1. MDD (major depressive disorder), recurrent episode, mild (HCC) Acute stressors include: conflict with her mother, who has medical condition, GI symptoms s/p colostomy reversal on Feb 23, anniversary of loss of her sister  Other stressors include: complication from surgery, unemployment, financial strain, loss of her sister in Nov 2022   History:   Exam is notable for brighter affect.  Although she continues to experience grief, she has started to engage in activities such as coloring, painting, and staying her neighbor since uptitration of rexulti.  Will continue current medication regimen while working on behavioral activation.  Will continue Pristiq to target depression.  Will continue rexulti adjunctive treatment for depression, along with BuSpar and propranolol as needed for anxiety.   2. Insomnia, unspecified type Unstable with middle insomnia.  She has started CBT I.   3. Binge eating disorder, unspecified severity Improving since discontinuation of Vraylar.  Will continue current dose of topiramate to target binge eating, and weight gain associated with antipsychotic use.   4. Iron deficiency without anemia # fatigue - ferritin 25 10/2023 Newly addressed. Will start iron tablet with vitamin C, will consume it alongside iron-rich foods.  Plan  Continue Pristiq 100 mg daily  - [facility], generic Continue rexulti 1 mg daily  Continue Buspar 7.5 mg twice a day (limited benefit from uptitration) Continue propranolol 20 mg daily as needed for anxiety- she declined refill Continue topiramate 200 mg daily Start iron sulfate 325 mg daily Next appointment-  4/28 at 10 30, IP The form will be  completed based on the patient request  - prazosin 2 mg every other night for hypertension - on tramadol - EKG QTc 394 msec on 11/2020   Past trials of medication- sertraline, lexapro, venlafaxine, bupropion (palpitation), Abilify, quetiapine (eating sweets), Vraylar (good effect, but caused increase in appetite), Geodon (increase in appetite). Trazodone, Remus Loffler, Lunesta (severe drowsiness), lemborexant (limited benefit), ramelteon (limited benefit)   The patient demonstrates the following risk factors for suicide: Chronic risk factors for suicide include: psychiatric disorder of depression . Acute risk factors for suicide include:  her family suffering from medical illness . Protective factors for this patient include: positive social support, coping skills, and hope for the future. Considering these factors, the overall suicide risk at this point appears to be low. Patient is appropriate for outpatient follow up.     This visit involved a longitudinal and complex condition  requiring extended medical decision-making, coordination of care, and management beyond what is typically captured in CPT 99214. The complexity of the patient's condition justifies the use of G2211.    Collaboration of Care: Collaboration of Care: Other reviewed notes in Epic  Patient/Guardian was advised Release of Information must be obtained prior to any record release in order to collaborate their care with an outside provider. Patient/Guardian was advised if they have not already done so to contact the registration department to sign all necessary forms in order for [place] to release information regarding their care.   Consent: Patient/Guardian gives verbal consent for treatment and assignment of benefits for services provided during this visit. Patient/Guardian expressed understanding and agreed to proceed.    Toni Hotter, MD 01/01/2024, 9:39 AM

## 2023-12-29 NOTE — Telephone Encounter (Signed)
 I have sent BuSpar 7.5 mg twice a day to [facility].

## 2024-01-01 ENCOUNTER — Encounter: Payer: Self-pay | Admitting: Psychiatry

## 2024-01-01 ENCOUNTER — Telehealth: Payer: 59 | Admitting: Psychiatry

## 2024-01-01 ENCOUNTER — Other Ambulatory Visit: Payer: Self-pay | Admitting: Psychiatry

## 2024-01-01 DIAGNOSIS — F330 Major depressive disorder, recurrent, mild: Secondary | ICD-10-CM

## 2024-01-01 DIAGNOSIS — G4700 Insomnia, unspecified: Secondary | ICD-10-CM

## 2024-01-01 DIAGNOSIS — F50819 Binge eating disorder, unspecified: Secondary | ICD-10-CM

## 2024-01-01 DIAGNOSIS — E611 Iron deficiency: Secondary | ICD-10-CM

## 2024-01-01 MED ORDER — BREXPIPRAZOLE 1 MG PO TABS: 1.0000 mg | ORAL_TABLET | Freq: Every day | ORAL | 0 refills | Status: DC

## 2024-01-01 MED ORDER — BREXPIPRAZOLE 1 MG PO TABS: 1.0000 mg | ORAL_TABLET | Freq: Every day | ORAL | 1 refills | Status: AC

## 2024-01-01 NOTE — Telephone Encounter (Signed)
 Could you contact [facility], and cancel rexuti order? Thanks.

## 2024-01-01 NOTE — Patient Instructions (Signed)
 Continue Pristiq 100 mg daily   Continue rexulti 1 mg daily  Continue Buspar 7.5 mg twice a day  Continue propranolol 20 mg daily as needed for anxiety Continue topiramate 200 mg daily Start iron sulfate 325 mg daily Next appointment-  4/28 at 10 30

## 2024-01-02 ENCOUNTER — Ambulatory Visit: Payer: 59 | Admitting: Psychology

## 2024-01-02 DIAGNOSIS — F5105 Insomnia due to other mental disorder: Secondary | ICD-10-CM

## 2024-01-02 DIAGNOSIS — G4700 Insomnia, unspecified: Secondary | ICD-10-CM

## 2024-01-02 DIAGNOSIS — F330 Major depressive disorder, recurrent, mild: Secondary | ICD-10-CM

## 2024-01-02 NOTE — Progress Notes (Signed)
 [facility] Counselor/Therapist Progress Note  [name], MRN: [id number],    Date: 01/02/2024  Time Spent: 11:00am-11:55am   55 minutes   Treatment Type: Individual Therapy  Reported Symptoms: stress, tired, anxious  Mental Status Exam: Appearance:  Casual     Behavior: Appropriate  Motor: Normal  Speech/Language:  Normal Rate  Affect: Appropriate  Mood: normal  Thought process: normal  Thought content:   WNL  Sensory/Perceptual disturbances:   WNL  Orientation: oriented to person, place, time/date, and situation  Attention: Good  Concentration: Good  Memory: WNL  Fund of knowledge:  Good  Insight:   Good  Judgment:  Good  Impulse Control: Good   Risk Assessment: Danger to Self:  No Self-injurious Behavior: No Danger to Others: No Duty to Warn:no Physical Aggression / Violence:No  Access to Firearms a concern: No  Gang Involvement:No   Subjective: Pt present for face-to-face individual therapy via video.  Pt consents to telehealth video session and is aware of limitations and benefits of virtual sessions. Location of pt: home Location of therapist: home office.   Pt completed her sleep diaries.  Analyzed and reviewed the diaries with pt.  Pt's sleep has been the same as it has been the past year.   Pt did not adhere to the recommendation of no day time naps and she did not keep a sleep schedule of 1am - 7am.    Addressed the barriers to pt's adhering to the sleep prescription.   Pt states she just always feels tired and so she just tried to grab sleep anytime she could day or night.   Educated pt about how this works against her goal to have a good night sleep.  Pt states she has also had health challenges.  She had kidney stones and has back issues.  Yesterday she had nerves burned in her back and she hopes that will help with the pain.   Pt did make progress in a couple of areas.   She has stopped vaping at night.   She has also  identified her recliner as her "nest" and when she is awake in the middle of the night she goes to her recliner and colors instead of getting on her phone.    Pt states that at times she bakes and does laundry in the middle of the night.  Recommended pt does not do that bc those activities are too activating.   Encouraged pt to engage in coloring, puzzles, or reading is she can't sleep at night.   Pt was tearful as she talked about having trouble concentrating and recalling words.   She gets anxious about social interactions bc she does not want to be embarrassed.   Addressed how important it is for pt to not withdraw from social connections.   Worked on Primary school teacher.   Pt is also still grieving the loss of her sister.   Provided supportive therapy. Gave pt the sleep prescription to have a bedtime of 12:00am and a rise time of 6:00am.   Identified that she can retreat to her recliner as her "nest" during middle of the night awakenings.  She can engage in coloring or crossword puzzles.  When pt starts to feel sleepy she will return to bed.   Encouraged pt to Not nap during the day.   Pt states she will work hard to adhere to these suggestions.  Plan to meet in 2 weeks.     Interventions: Cognitive Behavioral  Therapy and Insight-Oriented  Diagnosis:  F33.1 and F51.05  Plan of Care: Recommend ongoing therapy.   Pt participated in setting treatment goals.   Pt wants to improve sleep and concentration.   Plan to meet every two weeks.  Pt is in agreement with treatment plan.    Treatment Plan Client Abilities/Strengths  Pt is bright, engaged, and motivated for therapy.  Client Treatment Preferences  individual therapy  Client Statement of Needs  Improve sleep and coping skills.  Symptoms  Complains of difficulty falling asleep. Complains of difficulty remaining asleep. Problems Addressed  Sleep Disturbance Goals 1. Feel refreshed and energetic during wakeful hours. 2. Restore restful sleep  pattern. Objective Learn and implement stimulus control strategies to establish a consistent sleep-wake rhythm. Target Date: 2024-11-08 Frequency: Biweekly Progress: 10 Modality: individual Related Interventions 1. Discuss with the client the rationale for stimulus control strategies to establish a consistent sleep-wake cycle (see Behavioral Treatments for Sleep Disorders by Clarisa Kindred, and Kem Kays). 2. Teach the client stimulus control techniques (e.g., lie down to sleep only when sleepy; do not use the bed for activities like watching television, reading, listening to music, but only for sleep or sexual activity; get out of bed if sleep doesn't arrive soon after retiring; lie back down when sleepy; set alarm to the same wake-up time every morning regardless of sleep time or quality; do not nap during the day); assign consistent implementation. 3. Instruct the client to move activities associated with arousal and activation from the bedtime ritual to other times during the day (e.g., reading stimulating content, reviewing day's events, planning for next day, watching disturbing television). 4. Monitor the client's sleep patterns and compliance with stimulus control instructions; problem-solve obstacles and reinforce successful, consistent implementation. Objective Learn and implement a sleep restriction method to increase sleep efficiency. Target Date: 2024-11-08 Frequency: Biweekly Progress: 10 Modality: individual Related Interventions 5. Use a sleep restriction therapy approach in which the amount of time in bed is reduced to match the amount of time the patient typically sleeps (e.g., from 8 hours to 5), thus inducing systematic sleep deprivation; periodically adjust sleep time upward until an optimal sleep duration is reached. Objective Learn and implement calming skills for use at bedtime. Target Date: 2024-11-08 Frequency: Biweekly Progress: 10 Modality: individual Related  Interventions 6. Teach the client relaxation skills (e.g., progressive muscle relaxation, guided imagery, slow diaphragmatic breathing); teach the client how to apply these skills to facilitate relaxation and sleep at bedtime (see "Bedtime Relaxation Techniques" by Cherie Dark). 7. Refer the client for or conduct biofeedback training to strengthen the client's successful relaxation response. Objective Practice good sleep hygiene. Target Date: 2024-11-08 Frequency: Daily Progress: 10 Modality: individual Related Interventions 8. Instruct the client in sleep hygiene practices such as restricting excessive liquid intake, spicy late night snacks, or heavy evening meals; exercising regularly, but not within 3 - 4 hours of bedtime; minimizing or avoiding caffeine, alcohol, tobacco, and stimulant intake (or assign "Sleep Pattern Record" in the Adult Psychotherapy Homework Planner by [facility]). Diagnosis Primary Insomnia  Conditions For Discharge Achievement of treatment goals and objectives  Salomon Fick, LCSW

## 2024-01-02 NOTE — Telephone Encounter (Signed)
 Called [facility] spoke to [place] she stated that she would cancel the rexulti

## 2024-01-03 ENCOUNTER — Ambulatory Visit
Admission: RE | Admit: 2024-01-03 | Discharge: 2024-01-03 | Disposition: A | Discharge status: Home / Self Care | Source: Ambulatory Visit | Attending: Internal Medicine | Admitting: Internal Medicine

## 2024-01-03 DIAGNOSIS — Z1231 Encounter for screening mammogram for malignant neoplasm of breast: Secondary | ICD-10-CM | POA: Insufficient documentation

## 2024-01-05 ENCOUNTER — Other Ambulatory Visit: Payer: Self-pay | Admitting: Family Medicine

## 2024-01-05 ENCOUNTER — Other Ambulatory Visit: Payer: Self-pay | Admitting: Internal Medicine

## 2024-01-05 ENCOUNTER — Encounter: Payer: Self-pay | Admitting: Internal Medicine

## 2024-01-05 DIAGNOSIS — J0101 Acute recurrent maxillary sinusitis: Secondary | ICD-10-CM

## 2024-01-05 DIAGNOSIS — M5416 Radiculopathy, lumbar region: Secondary | ICD-10-CM

## 2024-01-05 MED ORDER — PROMETHAZINE-DM 6.25-15 MG/5ML PO SYRP: 5.0000 mL | ORAL_SOLUTION | Freq: Four times a day (QID) | ORAL | 0 refills | Status: AC | PRN

## 2024-01-05 MED ORDER — DOXYCYCLINE HYCLATE 100 MG PO TABS: 100.0000 mg | ORAL_TABLET | Freq: Two times a day (BID) | ORAL | 0 refills | Status: AC

## 2024-01-05 MED ORDER — FLUTICASONE PROPIONATE 50 MCG/ACT NA SUSP: 2.0000 | Freq: Every day | NASAL | 6 refills | Status: AC

## 2024-01-05 NOTE — Telephone Encounter (Signed)
 Please review.  KP

## 2024-01-09 ENCOUNTER — Ambulatory Visit
Admission: RE | Admit: 2024-01-09 | Discharge: 2024-01-09 | Disposition: A | Discharge status: Home / Self Care | Source: Ambulatory Visit | Attending: Family Medicine | Admitting: Family Medicine

## 2024-01-09 DIAGNOSIS — M5416 Radiculopathy, lumbar region: Secondary | ICD-10-CM | POA: Insufficient documentation

## 2024-01-14 ENCOUNTER — Encounter: Payer: Self-pay | Admitting: Internal Medicine

## 2024-01-15 ENCOUNTER — Other Ambulatory Visit: Payer: Self-pay | Admitting: Internal Medicine

## 2024-01-15 DIAGNOSIS — U071 COVID-19: Secondary | ICD-10-CM

## 2024-01-15 MED ORDER — NIRMATRELVIR/RITONAVIR (PAXLOVID)TABLET
3.0000 | ORAL_TABLET | Freq: Two times a day (BID) | ORAL | 0 refills | Status: AC
Start: 2024-01-15 — End: 2024-01-20

## 2024-01-15 NOTE — Telephone Encounter (Signed)
 Please review. Virtual?

## 2024-01-15 NOTE — Telephone Encounter (Signed)
 PCP sent over RX to [facility]. Called patient to notify her. Patient verbalized understanding.

## 2024-01-15 NOTE — Progress Notes (Unsigned)
 Date:  01/15/2024   Name:  Toni Green   DOB:  22-Apr-1961   MRN:  [id number]   Chief Complaint: No chief complaint on file.  HPI  Review of Systems   Lab Results  Component Value Date   NA 144 12/21/2023   K 4.0 12/21/2023   CO2 22 12/21/2023   GLUCOSE 89 12/21/2023   BUN 10 12/21/2023   CREATININE 0.67 12/21/2023   CALCIUM 8.9 12/21/2023   EGFR 99 12/21/2023   GFRNONAA 77 01/06/2020   Lab Results  Component Value Date   CHOL 183 12/21/2023   HDL 100 12/21/2023   LDLCALC 73 12/21/2023   TRIG 47 12/21/2023   CHOLHDL 1.8 12/21/2023   Lab Results  Component Value Date   TSH 3.300 12/21/2023   No results found for: "HGBA1C" Lab Results  Component Value Date   WBC 7.7 12/21/2023   HGB 13.2 12/21/2023   HCT 40.2 12/21/2023   MCV 95 12/21/2023   PLT 263 12/21/2023   Lab Results  Component Value Date   ALT 14 12/21/2023   AST 16 12/21/2023   ALKPHOS 99 12/21/2023   BILITOT 0.3 12/21/2023   Lab Results  Component Value Date   VD25OH 33.6 12/21/2023     Patient Active Problem List   Diagnosis Date Noted   Mixed hyperlipidemia 02/01/2022   Reactive hypoglycemia 12/21/2021   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   MDD (major depressive disorder), recurrent, in partial remission (HCC) 01/06/2021   S/P total hysterectomy 11/25/2020   Hemorrhoids    Anxiety state 10/14/2019   Binge eating disorder 10/14/2019   Restless leg syndrome 11/30/2018   Bright red rectal bleeding 11/30/2018   S/P gastric bypass 08/07/2017   Special screening for malignant neoplasms, colon    Benign neoplasm of ascending colon    Polyp of sigmoid colon    Menopause syndrome 07/26/2016   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   Essential (primary) hypertension 07/20/2015   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 disorder 07/20/2015   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   DDD (degenerative disc disease), cervical 03/17/2014   Calculus of kidney 02/13/2013     Allergies  Allergen Reactions   Losartan Palpitations    Could feel heartbeat (strongly) in neck   Penicillins Rash   Tape Rash    Skin irritation after back surgery white paper tape    Past Surgical History:  Procedure Laterality Date   APPENDECTOMY  2010   BACK SURGERY  10/2005   lumbar   COLONOSCOPY WITH PROPOFOL N/A 09/23/2016   Procedure: COLONOSCOPY WITH PROPOFOL;  Surgeon: Midge Minium, MD;  Location: [facility];  Service: Endoscopy;  Laterality: N/A;   COLOSTOMY  10/22/2021   CYSTOSCOPY     x 1   EVALUATION UNDER ANESTHESIA WITH HEMORRHOIDECTOMY N/A 07/06/2020   Procedure: EXAM UNDER ANESTHESIA WITH HEMORRHOIDECTOMY;  Surgeon: Duanne Guess, MD;  Location: [facility];  Service: General;  Laterality: N/A;   GALLBLADDER SURGERY  2009   lap   GASTRIC BYPASS  2008   roux en y   MUCOSAL ADVANCEMENT FLAP N/A 12/10/2020   Procedure: MUCOSAL ADVANCEMENT FLAP;  Surgeon: Romie Levee, MD;  Location: [facility];  Service: General;  Laterality: N/A;  60 MIN   POLYPECTOMY  09/23/2016   Procedure: POLYPECTOMY;  Surgeon: Midge Minium, MD;  Location: [facility];  Service: Endoscopy;;   TOTAL VAGINAL HYSTERECTOMY  1999    Social History   Tobacco Use   Smoking status:  Never   Smokeless tobacco: Never  Vaping Use   Vaping status: Never Used  Substance Use Topics   Alcohol use: Yes    Alcohol/week: 0.0 standard drinks of alcohol    Comment: RARE   Drug use: No     Medication list has been reviewed and updated.  No outpatient medications have been marked as taking for the 01/15/24 encounter (Orders Only) with Reubin Milan, MD.       12/21/2023    9:18 AM 10/24/2023    9:14 AM 06/29/2023   12:47 PM 10/03/2022   11:31 AM  GAD 7 : Generalized Anxiety Score  Nervous, Anxious, on Edge 3   3  Control/stop worrying 3   3  Worry too much - different things 3   3  Trouble relaxing 3   3  Restless 3   3  Easily annoyed or irritable 3    3  Afraid - awful might happen 3   3  Total GAD 7 Score 21   21  Anxiety Difficulty Somewhat difficult   Extremely difficult     Information is confidential and restricted. Go to Review Flowsheets to unlock data.       12/21/2023    9:16 AM 10/24/2023    9:14 AM 06/29/2023   12:46 PM  Depression screen PHQ 2/9  Decreased Interest 3    Down, Depressed, Hopeless 3    PHQ - 2 Score 6    Altered sleeping 3    Tired, decreased energy 3    Change in appetite 0    Feeling bad or failure about yourself  0    Trouble concentrating 3    Moving slowly or fidgety/restless 1    Suicidal thoughts 0    PHQ-9 Score 16    Difficult doing work/chores Not difficult at all       Information is confidential and restricted. Go to Review Flowsheets to unlock data.    BP Readings from Last 3 Encounters:  12/21/23 134/82  10/03/22 124/76  06/22/22 138/88    Physical Exam  Wt Readings from Last 3 Encounters:  12/21/23 177 lb 2 oz (80.3 kg)  10/03/22 162 lb (73.5 kg)  06/22/22 162 lb (73.5 kg)    There were no vitals taken for this visit.  Assessment and Plan:  Problem List Items Addressed This Visit   None   No follow-ups on file.    Reubin Milan, MD [facility] [place]

## 2024-01-17 ENCOUNTER — Ambulatory Visit: Payer: 59 | Admitting: Psychology

## 2024-01-28 ENCOUNTER — Encounter: Payer: Self-pay | Admitting: Internal Medicine

## 2024-01-29 ENCOUNTER — Other Ambulatory Visit: Payer: Self-pay

## 2024-01-29 DIAGNOSIS — G2581 Restless legs syndrome: Secondary | ICD-10-CM

## 2024-01-29 MED ORDER — ROPINIROLE HCL 4 MG PO TABS: 4.0000 mg | ORAL_TABLET | Freq: Every day | ORAL | 2 refills | Status: AC

## 2024-01-30 ENCOUNTER — Telehealth: Payer: Self-pay | Admitting: Psychiatry

## 2024-01-30 NOTE — Telephone Encounter (Signed)
 Noted-please proceed with rescheduling accordingly. I can authorize refills until the visit in July, once we are notified by the pharmacy.

## 2024-02-01 ENCOUNTER — Ambulatory Visit: Payer: 59 | Admitting: Psychology

## 2024-02-12 ENCOUNTER — Ambulatory Visit: Admitting: Psychiatry

## 2024-02-15 ENCOUNTER — Ambulatory Visit: Payer: 59 | Admitting: Psychology

## 2024-03-04 ENCOUNTER — Other Ambulatory Visit: Payer: Self-pay | Admitting: Internal Medicine

## 2024-03-04 ENCOUNTER — Ambulatory Visit: Admitting: Psychology

## 2024-03-04 DIAGNOSIS — E033 Postinfectious hypothyroidism: Secondary | ICD-10-CM

## 2024-03-06 NOTE — Telephone Encounter (Signed)
 LOV was her physical 12/21/2023.    Requested Prescriptions  Pending Prescriptions Disp Refills   levothyroxine  (SYNTHROID ) 75 MCG tablet [Pharmacy Med Name: Levothyroxine  Sodium 75 MCG Oral Tablet] 90 tablet 1    Sig: TAKE 1 TABLET BY MOUTH DAILY  BEFORE BREAKFAST     Endocrinology:  Hypothyroid Agents Failed - 03/06/2024  9:27 AM      Failed - Valid encounter within last 12 months    Recent Outpatient Visits           2 months ago Annual physical exam   [place], Chales [place], MD       Future Appointments             In 3 months Gala Jubilee Chales [place], MD [facility]            Passed - TSH in normal range and within 360 days    TSH  Date Value Ref Range Status  12/21/2023 3.300 0.450 - 4.500 uIU/mL Final

## 2024-03-21 ENCOUNTER — Telehealth: Payer: Self-pay

## 2024-03-21 NOTE — Telephone Encounter (Signed)
 Attempted to contact patient regarding a fax we received from [employer] for Long-term disability claim. Per Dr. Gala Jubilee patient needs to be seen in clinic to discuss paperwork in order to have it complete it. No answer to phone call. VM message left to call back and schedule an appointment.

## 2024-03-22 NOTE — Telephone Encounter (Signed)
 Attempted to contact patient regarding an appointment to discuss disability forms, no answer. Left VM message to call our office back.

## 2024-03-27 ENCOUNTER — Encounter: Payer: Self-pay | Admitting: Internal Medicine

## 2024-03-27 ENCOUNTER — Ambulatory Visit (INDEPENDENT_AMBULATORY_CARE_PROVIDER_SITE_OTHER): Admitting: Internal Medicine

## 2024-03-27 VITALS — BP 118/78 | HR 59 | Ht 70.0 in | Wt 173.1 lb

## 2024-03-27 DIAGNOSIS — M47816 Spondylosis without myelopathy or radiculopathy, lumbar region: Secondary | ICD-10-CM

## 2024-03-27 DIAGNOSIS — F322 Major depressive disorder, single episode, severe without psychotic features: Secondary | ICD-10-CM

## 2024-03-27 DIAGNOSIS — I10 Essential (primary) hypertension: Secondary | ICD-10-CM | POA: Diagnosis not present

## 2024-03-27 DIAGNOSIS — G2581 Restless legs syndrome: Secondary | ICD-10-CM | POA: Diagnosis not present

## 2024-03-27 NOTE — Assessment & Plan Note (Signed)
 Symptoms well controlled on Requip  4 mg qhs

## 2024-03-27 NOTE — Telephone Encounter (Signed)
 Patient seen in office on 03/27/2024.

## 2024-03-27 NOTE — Assessment & Plan Note (Signed)
 Followed by pain management Likely contributing to depression and influencing ADLs

## 2024-03-27 NOTE — Assessment & Plan Note (Signed)
 Blood pressure is well controlled.  Will continue same regimen along with efforts to limit dietary sodium.

## 2024-03-27 NOTE — Assessment & Plan Note (Signed)
 Clinically stable but active on multiple medications and followed by Psych.   No SI or HI on evaluation. Plan to continue same medications for now. Symptoms severe enough to affect ADLS due to concentration and focus deficits.

## 2024-03-27 NOTE — Progress Notes (Signed)
 Date:  03/27/2024   Name:  Toni Green   DOB:  06-Oct-1961   MRN:  [id number]   Chief Complaint: Form Completion She needs her disability forms completed for continuation.  Depression        This is a chronic problem.The problem is unchanged.  Associated symptoms include decreased concentration, fatigue and headaches.  Compliance with treatment is good.  Previous treatment provided mild relief. Hypertension This is a chronic problem. The problem is controlled. Associated symptoms include headaches. Pertinent negatives include no chest pain or shortness of breath.  Back Pain This is a chronic problem. The problem is unchanged. The pain is present in the lumbar spine. Associated symptoms include headaches. Pertinent negatives include no chest pain or fever.    Review of Systems  Constitutional:  Positive for fatigue. Negative for chills, diaphoresis, fever and unexpected weight change.  Respiratory:  Negative for chest tightness and shortness of breath.   Cardiovascular:  Negative for chest pain.  Musculoskeletal:  Positive for back pain.  Neurological:  Positive for headaches. Negative for dizziness.  Psychiatric/Behavioral:  Positive for decreased concentration, depression, dysphoric mood and sleep disturbance. The patient is nervous/anxious.      Lab Results  Component Value Date   NA 144 12/21/2023   K 4.0 12/21/2023   CO2 22 12/21/2023   GLUCOSE 89 12/21/2023   BUN 10 12/21/2023   CREATININE 0.67 12/21/2023   CALCIUM 8.9 12/21/2023   EGFR 99 12/21/2023   GFRNONAA 77 01/06/2020   Lab Results  Component Value Date   CHOL 183 12/21/2023   HDL 100 12/21/2023   LDLCALC 73 12/21/2023   TRIG 47 12/21/2023   CHOLHDL 1.8 12/21/2023   Lab Results  Component Value Date   TSH 3.300 12/21/2023   No results found for: HGBA1C Lab Results  Component Value Date   WBC 7.7 12/21/2023   HGB 13.2 12/21/2023   HCT 40.2 12/21/2023   MCV 95 12/21/2023   PLT 263 12/21/2023    Lab Results  Component Value Date   ALT 14 12/21/2023   AST 16 12/21/2023   ALKPHOS 99 12/21/2023   BILITOT 0.3 12/21/2023   Lab Results  Component Value Date   VD25OH 33.6 12/21/2023     Patient Active Problem List   Diagnosis Date Noted   Mixed hyperlipidemia 02/01/2022   Reactive hypoglycemia 12/21/2021   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   Moderately severe major depression (HCC) 01/06/2021   S/P total hysterectomy 11/25/2020   Hemorrhoids    Anxiety state 10/14/2019   Binge eating disorder 10/14/2019   Restless leg syndrome 11/30/2018   Bright red rectal bleeding 11/30/2018   S/P gastric bypass 08/07/2017   Special screening for malignant neoplasms, colon    Benign neoplasm of ascending colon    Polyp of sigmoid colon    Menopause syndrome 07/26/2016   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   Essential (primary) hypertension 07/20/2015   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 disorder 07/20/2015   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   DDD (degenerative disc disease), cervical 03/17/2014   Calculus of kidney 02/13/2013    Allergies  Allergen Reactions   Losartan Palpitations    Could feel heartbeat (strongly) in neck   Penicillins Rash   Tape Rash    Skin irritation after back surgery white paper tape    Past Surgical History:  Procedure Laterality Date   APPENDECTOMY  2010   BACK SURGERY  10/2005   lumbar  COLONOSCOPY WITH PROPOFOL  N/A 09/23/2016   Procedure: COLONOSCOPY WITH PROPOFOL ;  Surgeon: Marnee Sink, MD;  Location: [facility];  Service: Endoscopy;  Laterality: N/A;   COLOSTOMY  10/22/2021   CYSTOSCOPY     x 1   EVALUATION UNDER ANESTHESIA WITH HEMORRHOIDECTOMY N/A 07/06/2020   Procedure: EXAM UNDER ANESTHESIA WITH HEMORRHOIDECTOMY;  Surgeon: Mercy Stall, MD;  Location: [facility];  Service: General;  Laterality: N/A;   GALLBLADDER SURGERY  2009   lap   GASTRIC BYPASS  2008   roux en y    MUCOSAL ADVANCEMENT FLAP N/A 12/10/2020   Procedure: MUCOSAL ADVANCEMENT FLAP;  Surgeon: Joyce Nixon, MD;  Location: [facility];  Service: General;  Laterality: N/A;  60 MIN   POLYPECTOMY  09/23/2016   Procedure: POLYPECTOMY;  Surgeon: Marnee Sink, MD;  Location: [facility];  Service: Endoscopy;;   TOTAL VAGINAL HYSTERECTOMY  1999    Social History   Tobacco Use   Smoking status: Never   Smokeless tobacco: Never  Vaping Use   Vaping status: Never Used  Substance Use Topics   Alcohol use: Yes    Alcohol/week: 0.0 standard drinks of alcohol    Comment: RARE   Drug use: No     Medication list has been reviewed and updated.  Current Meds  Medication Sig   Blood Glucose Monitoring Suppl (ONETOUCH VERIO REFLECT) w/Device KIT 1 each by Does not apply route daily at 6 (six) AM.   busPIRone  (BUSPAR ) 7.5 MG tablet Take 1 tablet (7.5 mg total) by mouth 2 (two) times daily.   calcitonin, salmon, (MIACALCIN/FORTICAL) 200 UNIT/ACT nasal spray SMARTSIG:Both Nares   cetirizine (ZYRTEC) 10 MG tablet Take 10 mg by mouth daily.   desvenlafaxine  (PRISTIQ ) 100 MG 24 hr tablet Take 1 tablet (100 mg total) by mouth daily.   fluticasone  (FLONASE ) 50 MCG/ACT nasal spray Place 2 sprays into both nostrils daily.   furosemide  (LASIX ) 20 MG tablet TAKE 1 TABLET DAILY (Patient taking differently: Take 20 mg by mouth daily as needed (fluid retention.).)   glucose blood test strip Use as instructed qid   ibuprofen  (ADVIL ) 800 MG tablet Take 1 tablet (800 mg total) by mouth every 8 (eight) hours as needed.   irbesartan  (AVAPRO ) 300 MG tablet Take 1 tablet (300 mg total) by mouth daily. (Patient taking differently: Take 150 mg by mouth daily.)   levothyroxine  (SYNTHROID ) 75 MCG tablet TAKE 1 TABLET BY MOUTH DAILY  BEFORE BREAKFAST   methocarbamol  (ROBAXIN ) 500 MG tablet 1/2-1 po qHS prn   OneTouch Delica Lancets 30G MISC 1 each by Does not apply route 4 (four) times daily as needed.    prazosin  (MINIPRESS ) 2 MG capsule Take 1 capsule (2 mg total) by mouth at bedtime.   propranolol  (INDERAL ) 20 MG tablet Take 1 tablet (20 mg total) by mouth daily as needed. for anxiety   rOPINIRole  (REQUIP ) 4 MG tablet Take 1 tablet (4 mg total) by mouth at bedtime.   tamsulosin  (FLOMAX ) 0.4 MG CAPS capsule Take 1 capsule (0.4 mg total) by mouth daily as needed. (Patient taking differently: Take 0.4 mg by mouth daily as needed (Kidney stones).)   topiramate  (TOPAMAX ) 100 MG tablet Take 2 tablets (200 mg total) by mouth at bedtime.   traMADol (ULTRAM) 50 MG tablet Take 25-50 mg by mouth 2 (two) times daily as needed (back pain.).    valACYclovir (VALTREX) 1000 MG tablet as needed.       03/27/2024    3:32  PM 12/21/2023    9:18 AM 10/24/2023    9:14 AM 06/29/2023   12:47 PM  GAD 7 : Generalized Anxiety Score  Nervous, Anxious, on Edge 3 3    Control/stop worrying 3 3    Worry too much - different things 3 3    Trouble relaxing 3 3    Restless 3 3    Easily annoyed or irritable 3 3    Afraid - awful might happen 3 3    Total GAD 7 Score 21 21    Anxiety Difficulty Extremely difficult Somewhat difficult       Information is confidential and restricted. Go to Review Flowsheets to unlock data.       03/27/2024    3:32 PM 12/21/2023    9:16 AM 10/24/2023    9:14 AM  Depression screen PHQ 2/9  Decreased Interest 3 3   Down, Depressed, Hopeless 3 3   PHQ - 2 Score 6 6   Altered sleeping 3 3   Tired, decreased energy 3 3   Change in appetite 1 0   Feeling bad or failure about yourself  3 0   Trouble concentrating 3 3   Moving slowly or fidgety/restless 3 1   Suicidal thoughts 0 0   PHQ-9 Score 22 16   Difficult doing work/chores Extremely dIfficult Not difficult at all      Information is confidential and restricted. Go to Review Flowsheets to unlock data.    BP Readings from Last 3 Encounters:  03/27/24 118/78  12/21/23 134/82  10/03/22 124/76    Physical  Exam Constitutional:      General: She is not in acute distress.    Appearance: She is not ill-appearing.  Cardiovascular:     Rate and Rhythm: Normal rate and regular rhythm.     Heart sounds: No murmur heard. Pulmonary:     Effort: Pulmonary effort is normal.     Breath sounds: No wheezing or rhonchi.  Musculoskeletal:     Cervical back: Normal range of motion.  Lymphadenopathy:     Cervical: No cervical adenopathy.  Neurological:     Mental Status: She is alert.  Psychiatric:        Attention and Perception: Attention normal.        Mood and Affect: Mood is depressed.        Speech: Speech normal.        Behavior: Behavior is cooperative.        Thought Content: Thought content normal.     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  Will continue same regimen along with efforts to limit dietary sodium.       Restless leg syndrome (Chronic)   Symptoms well controlled on Requip  4 mg qhs      Moderately severe major depression (  HCC) - Primary (Chronic)   Clinically stable but active on multiple medications and followed by Psych.   No SI or HI on evaluation. Plan to continue same medications for now. Symptoms severe enough to affect ADLS due to concentration and focus deficits.       Form will be completed for LTD.  No follow-ups on file.    Sheron Dixons, MD [facility] [place]

## 2024-04-02 ENCOUNTER — Telehealth: Payer: Self-pay | Admitting: Psychiatry

## 2024-04-02 NOTE — Telephone Encounter (Signed)
 she stated that she would extend to the 7-1 but need ASAP because it will already be late and will interfer with patient disability.

## 2024-04-02 NOTE — Telephone Encounter (Signed)
 per dr. Edda Goo order i left a message with patient case worker to see if they could delay due date until pt is seen on july 1st

## 2024-04-02 NOTE — Telephone Encounter (Signed)
 per dr. Edda Goo order she wanted to find out if it could be extend out a few business days. i had to leave a message.

## 2024-04-02 NOTE — Telephone Encounter (Signed)
 i also reached out to patient to see if she could contact her case worker and see if they could delay the due date. she stated that she will call but she did n't think they would but she would reach out and ask.

## 2024-04-02 NOTE — Telephone Encounter (Signed)
 pt states that she does not have insurance and that is why she has not been seen and that the paperwork is due on june 21st. she states that she will have insurance on 7-1 and that she had moved her appt up to that date.

## 2024-04-02 NOTE — Telephone Encounter (Signed)
 I saw the paperwork for long-term disability. I have not seen her since March of this year due to insurance coverage issues, and therefore cannot make any comments at this time. Please advise them that we will complete and send the form after her next visit in July.

## 2024-04-13 NOTE — Progress Notes (Unsigned)
 [facility] MD/PA/NP OP Progress Note  04/16/2024 5:30 PM Toni Green  MRN:  [id number]  Chief Complaint:  Chief Complaint  Patient presents with   Follow-up   HPI:  - she is not seen since March 2025 This is a follow-up appointment for depression, anxiety, insomnia.  She states that she could not come to the appointment due to limited insurance coverage. She states that there has been not much change.  Her mother's physical condition is failing.  She is trying to look after her.  She has COPD, and uses a walker.  Her mother expects her to move there, and she was finally able to convince her that she will not go there.  She states that they have volatile relationship (oil and water ).  She states that she is never good enough for her.  She talks about an episode of her telling her that she wears the most disgusting dress when she visited her to pick up for baby shower.  She states that her mother has been that way, although she feels glad that she is not that way to her sister.  She continues to miss her sister.  Although she occasionally thinks about good moment, she cries a lot most of the time.  They were very close, and was her best friend.  She continues to struggle with insomnia.  She denies any recliner due to back pain.  She denies nightmares as she is unable to sleep.  She hates going out, including grocery shopping, although she used to love shopping.  Although she has continued to do coloring, she is unable to do reading as she has significant difficulty with concentration.  She denies SI, HI, hallucinations.  She reports significant anxiety, and she is scared something were to happen to her husband (and she becomes tearful).  She has been drinking to sleep, and also wishes that she could forget about things.  Although she is not concerned about her drinking, she is concerned about the weight gain. She does not think she can work, and would like the paperwork to be submitted. She agrees with the  plans as outlined below.   Wt Readings from Last 3 Encounters:  04/16/24 172 lb 3.2 oz (78.1 kg)  03/27/24 173 lb 2 oz (78.5 kg)  12/21/23 177 lb 2 oz (80.3 kg)     Substance use   Tobacco Alcohol Other substances/  Current denies 2-3 beers every day, , used to drink 2-4 glasses of wine or beers,  5 days a week CBD for insomnia, twice since Feb, coffee in the morning, decaf tea   CBD gummies at times  Past denies  6 pack on weekend    Past Treatment           Visit Diagnosis:    ICD-10-CM   1. MDD (major depressive disorder), recurrent episode, moderate (HCC)  F33.1     2. Insomnia, unspecified type  G47.00     3. Binge eating disorder, unspecified severity  F50.819     4. Iron deficiency  E61.1     5. Major depressive disorder, recurrent episode, moderate (HCC)  F33.1 busPIRone  (BUSPAR ) 7.5 MG tablet      Past Psychiatric History: Please see initial evaluation for full details. I have reviewed the history. No updates at this time.     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   Claustrophobia    Complication of surgery 02/20/2015   Overview:  Dilated pouch found on EGD done 12/2014    Depression    History of colostomy reversal 11/2022   History of kidney stones    Hypertension    Hypoglycemia    Hypothyroidism    Inflammation of sacroiliac joint (HCC) 08/22/2014   Pneumonia 03/2020   PONV (postoperative nausea and vomiting)    NASUEATED/BAD HEADACHES   Seizures (HCC) 1985   x1, after labor/childbirth   Thyroid disease    Wears glasses     Past Surgical History:  Procedure Laterality Date   APPENDECTOMY  2010   BACK SURGERY  10/2005   lumbar   COLONOSCOPY WITH PROPOFOL  N/A 09/23/2016   Procedure: COLONOSCOPY WITH PROPOFOL ;  Surgeon:  Rogelia Copping, MD;  Location: [facility];  Service: Endoscopy;  Laterality: N/A;   COLOSTOMY  10/22/2021   CYSTOSCOPY     x 1   EVALUATION UNDER ANESTHESIA WITH HEMORRHOIDECTOMY N/A 07/06/2020   Procedure: EXAM UNDER ANESTHESIA WITH HEMORRHOIDECTOMY;  Surgeon: Marolyn Nest, MD;  Location: [facility];  Service: General;  Laterality: N/A;   GALLBLADDER SURGERY  2009   lap   GASTRIC BYPASS  2008   roux en y   MUCOSAL ADVANCEMENT FLAP N/A 12/10/2020   Procedure: MUCOSAL ADVANCEMENT FLAP;  Surgeon: Debby Hila, MD;  Location: [facility];  Service: General;  Laterality: N/A;  60 MIN   POLYPECTOMY  09/23/2016   Procedure: POLYPECTOMY;  Surgeon: Rogelia Copping, MD;  Location: [facility];  Service: Endoscopy;;   TOTAL VAGINAL HYSTERECTOMY  1999    Family Psychiatric History: Please see initial evaluation for full details. I have reviewed the history. No updates at this time.     Family History:  Family History  Problem Relation Age of Onset   Depression Mother    Hypertension Mother    Depression Father    Hypertension Father    Depression Sister    Breast cancer Sister 25   Hypertension Maternal Grandfather    Depression Maternal Grandmother    Hypertension Maternal Grandmother    Hypertension Paternal Grandfather    Hypertension Paternal Grandmother     Social History:  Social History   Socioeconomic History   Marital status: Married    Spouse name: Not on file   Number of children: Not on file   Years of education: Not on file   Highest education level: Associate degree: occupational, Scientist, product/process development, or vocational program  Occupational History   Not on file  Tobacco Use   Smoking status: Never   Smokeless tobacco: Never  Vaping Use   Vaping status: Never Used  Substance and Sexual Activity   Alcohol use: Yes    Alcohol/week: 0.0 standard drinks of alcohol    Comment: RARE   Drug use: No   Sexual activity: Not Currently  Other Topics  Concern   Not on file  Social History Narrative   Not on file   Social Drivers of Health   Financial Resource Strain: Medium Risk (07/04/2023)   Overall Financial Resource Strain (CARDIA)    Difficulty of Paying Living Expenses: Somewhat hard  Food Insecurity: No Food Insecurity (07/04/2023)   Hunger Vital Sign    Worried About Running Out of Food in the Last  Year: Never true    Ran Out of Food in the Last Year: Never true  Transportation Needs: No Transportation Needs (07/04/2023)   PRAPARE - Administrator, Civil Service (Medical): No    Lack of Transportation (Non-Medical): No  Physical Activity: Unknown (07/04/2023)   Exercise Vital Sign    Days of Exercise per Week: 0 days    Minutes of Exercise per Session: Not on file  Stress: Stress Concern Present (07/04/2023)   [employer] of Occupational Health - Occupational Stress Questionnaire    Feeling of Stress : Very much  Social Connections: Moderately Integrated (07/04/2023)   Social Connection and Isolation Panel    Frequency of Communication with Friends and Family: Three times a week    Frequency of Social Gatherings with Friends and Family: Never    Attends Religious Services: Never    Database administrator or Organizations: Yes    Attends Banker Meetings: Never    Marital Status: Married    Allergies:  Allergies  Allergen Reactions   Losartan Palpitations    Could feel heartbeat (strongly) in neck   Penicillins Rash   Tape Rash    Skin irritation after back surgery white paper tape    Metabolic Disorder Labs: No results found for: HGBA1C, MPG No results found for: PROLACTIN Lab Results  Component Value Date   CHOL 183 12/21/2023   TRIG 47 12/21/2023   HDL 100 12/21/2023   CHOLHDL 1.8 12/21/2023   LDLCALC 73 12/21/2023   LDLCALC 94 02/01/2022   Lab Results  Component Value Date   TSH 3.300 12/21/2023   TSH 2.330 02/01/2022    Therapeutic Level Labs: No results  found for: LITHIUM No results found for: VALPROATE No results found for: CBMZ  Current Medications: Current Outpatient Medications  Medication Sig Dispense Refill   brexpiprazole  (REXULTI ) 1 MG TABS tablet Take 1.5 tablets (1.5 mg total) by mouth daily. 45 tablet 1   alendronate (FOSAMAX) 70 MG tablet Take 70 mg by mouth once a week. (Patient not taking: Reported on 03/27/2024)     Blood Glucose Monitoring Suppl (ONETOUCH VERIO REFLECT) w/Device KIT 1 each by Does not apply route daily at 6 (six) AM. 1 kit 0   busPIRone  (BUSPAR ) 7.5 MG tablet Take 1 tablet (7.5 mg total) by mouth 2 (two) times daily. 180 tablet 1   calcitonin, salmon, (MIACALCIN/FORTICAL) 200 UNIT/ACT nasal spray SMARTSIG:Both Nares     cetirizine (ZYRTEC) 10 MG tablet Take 10 mg by mouth daily.     desvenlafaxine  (PRISTIQ ) 100 MG 24 hr tablet Take 1 tablet (100 mg total) by mouth daily. 90 tablet 1   famotidine  (PEPCID ) 40 MG tablet Take by mouth. (Patient not taking: Reported on 03/27/2024)     fluticasone  (FLONASE ) 50 MCG/ACT nasal spray Place 2 sprays into both nostrils daily. 16 g 6   furosemide  (LASIX ) 20 MG tablet TAKE 1 TABLET DAILY (Patient taking differently: Take 20 mg by mouth daily as needed (fluid retention.).) 90 tablet 3   glucose blood test strip Use as instructed qid 100 each 12   ibuprofen  (ADVIL ) 800 MG tablet Take 1 tablet (800 mg total) by mouth every 8 (eight) hours as needed. 30 tablet 0   irbesartan  (AVAPRO ) 300 MG tablet Take 1 tablet (300 mg total) by mouth daily. (Patient taking differently: Take 150 mg by mouth daily.) 90 tablet 3   levothyroxine  (SYNTHROID ) 75 MCG tablet TAKE 1 TABLET BY MOUTH DAILY  BEFORE  BREAKFAST 90 tablet 1   methocarbamol  (ROBAXIN ) 500 MG tablet 1/2-1 po qHS prn     ondansetron  (ZOFRAN ) 8 MG tablet Take 8 mg by mouth every 8 (eight) hours as needed for nausea or vomiting. (Patient not taking: Reported on 03/27/2024)     OneTouch Delica Lancets 30G MISC 1 each by Does not  apply route 4 (four) times daily as needed. 100 each 12   prazosin  (MINIPRESS ) 2 MG capsule Take 1 capsule (2 mg total) by mouth at bedtime. 90 capsule 3   propranolol  (INDERAL ) 20 MG tablet Take 1 tablet (20 mg total) by mouth daily as needed. for anxiety 90 tablet 0   rOPINIRole  (REQUIP ) 4 MG tablet Take 1 tablet (4 mg total) by mouth at bedtime. 90 tablet 2   tamsulosin  (FLOMAX ) 0.4 MG CAPS capsule Take 1 capsule (0.4 mg total) by mouth daily as needed. (Patient taking differently: Take 0.4 mg by mouth daily as needed (Kidney stones).) 30 capsule 2   topiramate  (TOPAMAX ) 100 MG tablet Take 2 tablets (200 mg total) by mouth at bedtime. 180 tablet 1   traMADol (ULTRAM) 50 MG tablet Take 25-50 mg by mouth 2 (two) times daily as needed (back pain.).      valACYclovir (VALTREX) 1000 MG tablet as needed.     No current facility-administered medications for this visit.     Musculoskeletal: Strength & Muscle Tone: within normal limits Gait & Station: normal Patient leans: N/A  Psychiatric Specialty Exam: Review of Systems  Psychiatric/Behavioral:  Positive for decreased concentration, dysphoric mood and sleep disturbance. Negative for agitation, behavioral problems, confusion, hallucinations, self-injury and suicidal ideas. The patient is nervous/anxious. The patient is not hyperactive.   All other systems reviewed and are negative.   Blood pressure (!) 158/104, pulse (!) 53, temperature (!) 97.4 F (36.3 C), temperature source Temporal, height 5' 10 (1.778 m), weight 172 lb 3.2 oz (78.1 kg).Body mass index is 24.71 kg/m.  General Appearance: Well Groomed  Eye Contact:  Good  Speech:  Clear and Coherent  Volume:  Normal  Mood:  Anxious and Depressed  Affect:  Appropriate, Congruent, and Tearful  Thought Process:  Coherent  Orientation:  Full (Time, Place, and Person)  Thought Content: Logical   Suicidal Thoughts:  No  Homicidal Thoughts:  No  Memory:  Immediate;   Good  Judgement:   Good  Insight:  Good  Psychomotor Activity:  Normal  Concentration:  Concentration: Good and Attention Span: Good  Recall:  Good  Fund of Knowledge: Good  Language: Good  Akathisia:  No  Handed:  Right  AIMS (if indicated): not done  Assets:  Communication Skills Desire for Improvement  ADL's:  Intact  Cognition: WNL  Sleep:  Poor   Screenings: AUDIT    Flowsheet Row Appointment from 08/08/2023 in [facility] Primary [facility] Medicine at [facility] Most recent reading at 07/04/2023  2:07 PM Appointment from 07/05/2023 in [facility] Primary Care & Sports Medicine at [facility] Most recent reading at 07/04/2023  2:07 PM  Alcohol Use Disorder Identification Test Final Score (AUDIT) 9  9    GAD-7    Flowsheet Row Office Visit from 03/27/2024 in [facility] Medicine at [facility] Office Visit from 12/21/2023 in [facility] Primary Care & Sports Medicine at [facility] Office Visit from 10/24/2023 in [facility] Office Visit from 06/29/2023 in [facility] Office Visit from 10/03/2022  in [facility] Primary Care & Sports Medicine at [facility]  Total GAD-7 Score 21 21 21 21 21    PHQ2-9    Flowsheet Row Office Visit from 03/27/2024 in [facility] Primary Care & Sports Medicine at [facility] Office Visit from 12/21/2023 in [facility] Primary Care & Sports Medicine at [facility] Office Visit from 10/24/2023 in [facility] Office Visit from 06/29/2023 in [facility] Office Visit from 10/03/2022 in [facility] Primary Care & [facility] Medicine at [facility]  PHQ-2 Total Score 6 6 6 6 6   PHQ-9 Total Score 22 16 21 23 22    Flowsheet Row Office Visit from 06/28/2022 in [facility] Office Visit from 05/26/2022 in [facility] Office Visit from 05/05/2022 in [facility]  C-SSRS RISK CATEGORY No Risk No Risk No Risk     Assessment and Plan:  Toni Green is a 63 y.o. 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   1. MDD (major depressive disorder), recurrent episode, moderate (HCC) Acute stressors include: conflict with her mother, who has medical condition, GI symptoms s/p colostomy reversal on Feb 23, anniversary of loss of her sister  Other stressors include: complication from surgery, unemployment, financial strain, loss of her sister in Nov 2022   History: Tx from Dr. Vincente. Originally on Pristiq  100 mg daily, lamotrigine  150 mg daily, topiramate  200 mg daily, propranolol  20 mg TID. Declined TMS   The exam is notable for tearfulness, and she continues to experience depressive symptoms, fighting in the setting of from loss of her sister.  She is also experiencing recent stressors, including ongoing communication difficulties with her mother with medical illness, and feelings of being criticized.  However, she appears to be more engaged in daily activities such as coloring, house for chores since being on Rexulti .  Will titrate the dose symptomatic treatment for depression.  Discussed potential metabolic side effect, EPS and QTc prolongation.  Will continue Pristiq  to target depression.  Although it was discussed to consider switching to medication with IR formulation given her history of bypass surgery, she has strong fear of adjusting her medication.  Will continue BuSpar  and propranolol  as needed for anxiety.   2. Insomnia,  unspecified type Unstable.  She will see Ms.Terri for therapy again.  3. Binge eating disorder, unspecified severity Overall improving since discontinuation of Vraylar .  Will continue current dose of topiramate  to target binge eating, and weight gain associated with antipsychotic use.   4. Iron deficiency - ferritin 25 10/2023 She was advised again to start iron tablet with vitamin C, will consume it alongside iron-rich foods.  # Alcohol use She reports escalated alcohol use for insomnia, and for her mood symptoms.  Although she is not interested in pharmacological treatment, she is willing to limit to 1 beer a day.  Will continue to provide motivational interview.   # hypertension She was advised to monitor home BP, and notify her PCP if it continues to be elevated.   Plan  Continue Pristiq  100 mg daily  - optum, generic Increase rexulti  1.5 mg daily  Continue Buspar  7.5 mg twice a  day (limited benefit from uptitration) Continue propranolol  20 mg daily as needed for anxiety- she declined refill Continue topiramate  200 mg daily Start iron sulfate 325 mg daily Reduce alcohol to one beer per day (currently drinks up to four a day) Next appointment-  8/7 at 10 AM, IP The paperwork is completed. It will be faxed by the staff.  - prazosin  2 mg every other night for hypertension - on tramadol - EKG QTc 394 msec on 11/2020   Past trials of medication- sertraline , lexapro, venlafaxine, bupropion  (palpitation), Abilify, quetiapine  (eating sweets), Vraylar  (good effect, but caused increase in appetite), Geodon  (increase in appetite). Trazodone , ambien , Lunesta  (severe drowsiness), lemborexant  (limited benefit), ramelteon  (limited benefit)   The patient demonstrates the following risk factors for suicide: Chronic risk factors for suicide include: psychiatric disorder of depression . Acute risk factors for suicide include:  her family suffering from medical illness . Protective factors for this  patient include: positive social support, coping skills, and hope for the future. Considering these factors, the overall suicide risk at this point appears to be low. Patient is appropriate for outpatient follow up.   A total of 45 minutes was spent on the following activities during the encounter date, which includes but is not limited to: preparing to see the patient (e.g., reviewing tests and records), obtaining and/or reviewing separately obtained history, performing a medically necessary examination or evaluation, counseling and educating the patient, family, or caregiver, ordering medications, tests, or procedures, referring and communicating with other healthcare professionals (when not reported separately), documenting clinical information in the electronic or paper health record, independently interpreting test or lab results and communicating these results to the family or caregiver, and coordinating care (when not reported separately).     Collaboration of Care: Collaboration of Care: Other reviewed notes in Epic  Patient/Guardian was advised Release of Information must be obtained prior to any record release in order to collaborate their care with an outside provider. Patient/Guardian was advised if they have not already done so to contact the registration department to sign all necessary forms in order for us  to release information regarding their care.   Consent: Patient/Guardian gives verbal consent for treatment and assignment of benefits for services provided during this visit. Patient/Guardian expressed understanding and agreed to proceed.    [name], MD 04/16/2024, 5:30 PM

## 2024-04-16 ENCOUNTER — Ambulatory Visit (INDEPENDENT_AMBULATORY_CARE_PROVIDER_SITE_OTHER): Admitting: Psychiatry

## 2024-04-16 ENCOUNTER — Other Ambulatory Visit: Payer: Self-pay

## 2024-04-16 ENCOUNTER — Encounter: Payer: Self-pay | Admitting: Psychiatry

## 2024-04-16 VITALS — BP 158/104 | HR 53 | Temp 97.4°F | Ht 70.0 in | Wt 172.2 lb

## 2024-04-16 DIAGNOSIS — G4700 Insomnia, unspecified: Secondary | ICD-10-CM | POA: Diagnosis not present

## 2024-04-16 DIAGNOSIS — F50819 Binge eating disorder, unspecified: Secondary | ICD-10-CM

## 2024-04-16 DIAGNOSIS — F331 Major depressive disorder, recurrent, moderate: Secondary | ICD-10-CM | POA: Diagnosis not present

## 2024-04-16 DIAGNOSIS — E611 Iron deficiency: Secondary | ICD-10-CM | POA: Diagnosis not present

## 2024-04-16 MED ORDER — DESVENLAFAXINE SUCCINATE ER 100 MG PO TB24: 100.0000 mg | ORAL_TABLET | Freq: Every day | ORAL | 1 refills | Status: DC

## 2024-04-16 MED ORDER — BUSPIRONE HCL 7.5 MG PO TABS: 7.5000 mg | ORAL_TABLET | Freq: Two times a day (BID) | ORAL | 1 refills | Status: DC

## 2024-04-16 MED ORDER — BREXPIPRAZOLE 1 MG PO TABS: 1.5000 mg | ORAL_TABLET | Freq: Every day | ORAL | 1 refills | Status: DC

## 2024-04-16 NOTE — Patient Instructions (Signed)
 Continue Pristiq  100 mg daily   Increase rexulti  1.5 mg daily  Continue Buspar  7.5 mg twice a day  Continue propranolol  20 mg daily as needed for anxiety Continue topiramate  200 mg daily Start iron sulfate 325 mg daily Next appointment-  8/7 at 10 AM

## 2024-04-24 ENCOUNTER — Encounter (INDEPENDENT_AMBULATORY_CARE_PROVIDER_SITE_OTHER): Payer: Self-pay

## 2024-04-24 DIAGNOSIS — F331 Major depressive disorder, recurrent, moderate: Secondary | ICD-10-CM

## 2024-04-25 NOTE — Telephone Encounter (Signed)
 Could you contact the pharmacy to verify the prescription that was sent on 7/1 and update the patient accordingly? Thanks.

## 2024-04-29 DIAGNOSIS — M5126 Other intervertebral disc displacement, lumbar region: Secondary | ICD-10-CM | POA: Diagnosis not present

## 2024-04-29 DIAGNOSIS — M5416 Radiculopathy, lumbar region: Secondary | ICD-10-CM | POA: Diagnosis not present

## 2024-04-29 DIAGNOSIS — M48062 Spinal stenosis, lumbar region with neurogenic claudication: Secondary | ICD-10-CM | POA: Diagnosis not present

## 2024-04-29 DIAGNOSIS — M1612 Unilateral primary osteoarthritis, left hip: Secondary | ICD-10-CM | POA: Diagnosis not present

## 2024-04-29 DIAGNOSIS — M47816 Spondylosis without myelopathy or radiculopathy, lumbar region: Secondary | ICD-10-CM | POA: Diagnosis not present

## 2024-04-29 DIAGNOSIS — Z79899 Other long term (current) drug therapy: Secondary | ICD-10-CM | POA: Diagnosis not present

## 2024-04-29 NOTE — Progress Notes (Signed)
 HPI The patient is a pleasant 63 year-old female who presents today for followup of bilateral neck and trapezius ridge pain of over 1 years duration. She denies any preceding injury or trauma. She describes the sensation as if her head is too heavy.  She has good days and bad days and has crepitus. She denies any upper extremity pain. Her symptoms are worsened with sitting at the computer. She does have some relief with heat and massage. She has had an MRI of the cervical spine and lumbar spine obtained at [facility].  She was evaluated at the [facility] on 06/06/13.  NSAIDs are avoided with her history of gastric bypass surgery. Flexeril was not beneficial. Norco 5/325 twice a day does provide some relief.  We have discussed up regulation of pain receptors.  She went to physical therapy for one visit and this was not beneficial.  She has depression and her depression has been stable on Pristiq .  She is reluctant to try Cymbalta.  I have treated her husband who has had some improvement with cervical injections.  She saw Dr. Edie on 01/27/16 for possible bursectomy.  She was not ready for surgery and was sent to physical therapy.  Her neck pain has been stable.  She has pain midline at the C2 level.  She has been in physical therapy which provides very temporary relief but her symptoms returned.  If she continues to have significant pain after completion of her physical therapy consideration may be made for an updated MRI of the cervical spine (last study in 2014).  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  She is planning for colostomy reversal in the future.  She was last evaluated on 02/26/2024 at which time she was experiencing pain involving the left lower back into the superior gluteal region.  She had worsening of pain in the left buttock radiating into the posterior lateral thigh to the knee.  She was scheduled for a left  L4-5 and left L5-S1 transforaminal ESI.  Her pain is worsened with standing, walking, lying in bed, and interferes with sleep.  She has had to sleep in a recliner/on the couch.  Today she reports minimal improvement following the ESI.  On reevaluation she has pain in the left buttock and groin that is reproduced with range of motion of the left hip.  She has been walking with a limp.  She would like to schedule a left hip injection.  She is scheduled to see Dr. DELENA Neri regarding right knee degenerative changes and possible replacement on 05/07/2024.  At her last appointment she tried gabapentin  300 mg nightly but this was not tolerated it as it left her hung over the next day past noon.  It was not helpful.  She continues with tramadol 50 mg twice daily as needed.    Updated MRI of the lumbar spine on 01/09/2024 demonstrated progressive left lateral recess stenosis at L4-5.  She prefers to avoid prednisone as it causes severe side effects of making her mean and very emotional.    Her sister whom she helped care for passed away in 2021/09/09.  She had hemorrhoidectomy surgery in mid August 2021.  The patient has signed a Metall[employer] (05/15/2023). The [place]  Narcotic Database was reviewed today.   Tox assure 13 performed in office on 05/15/2023 (appropriate) last dose was 930 AM 05/15/2023  Urine drug screen performed on 08/20/2018  negative for hydrocodone  positive for tramadol for which she was not prescribed patient states she took her mother's Patient has been made aware that tramadol 50 mg number 40/month is our maximum.  Injections: 03/19/2024: Left L4-5 and left L5-S1 transforaminal ESI (minimal relief) 01/01/2024: RFA to the left L4-5 and L5-S1 facet joints (improvement of left-sided low back pain) 08/21/2023: RFA to the right L4-5 and L5-S1 facet joints (90% relief) 08/08/2023: MBB to the right L4-5 and L5-S1 facet joints (10/10 to 1/10) 07/18/2023: MBB  to the right L4-5 and L5-S1 facet joints (8/10 to 1-2/10) 05/24/2023: Left hip injection (75% relief) 05/12/2023: RFA to the left L4-5 and L5-S1 facet joints 05/10/2023: MBB to the left L4-5 and L5-S1 facet joints (9/10 to 1/10) 04/25/2023: MBB to the left L4-5 and L5-S1 facet joints (8/10 to 1-2/10) 04/05/2023: Left L3-4 transforaminal ESI (dexamethasone  12 mg) 09/13/2022: Left L3-4 transforaminal ESI (over 80% relief, dexamethasone  12 mg) 05/02/2022: Left L3-4 transforaminal ESI (50% relief, dexamethasone  12 mg)  12/31/2021: Left L3-4 transforaminal ESI (90% relief for 2 weeks then gradual return of pain, dexamethasone  12 mg) 04/12/2021: Left L3-4 transforaminal ESI (70% relief dexamethasone  12 mg) 09/01/2020: Bilateral L3-4 transforaminal ESI (Dexamethasone  12 mg, mild relief) 08/13/2020: RF neurotomy to the left L4-5 and L5-S1 facet joints (good relief) 06/29/2020: MBB to the left L4-5 and L5-S1 facet joints (8/10 to 1/10) 06/10/2020: MBB to the left L4-5 and L5-S1 facet joints (8/10 to 65% relief) 03/11/2020: Left L3-4 transforaminal ESI (good relief) 10/21/2019: Left L3-4 transforaminal ESI (good relief)  07/18/2019: Left L3-4 transforaminal ESI (good relief) 03/04/2019: Left L3-4 transforaminal ESI (good relief) 10/15/2018: Left L3-4 transforaminal ESI (good relief) 08/20/2018: Left greater trochanteric bursa injection (no relief) 07/16/2018: Left L3-4 transforaminal ESI (good relief) 04/09/2018: Left L3-4 transforaminal ESI (good relief) 01/01/2018: Left L3-4 transforaminal ESI (good relief) 10/05/2017: Left L3-4 transforaminal ESI (good relief) 07/28/2017: Left L3-4 transforaminal ESI (moderate to good relief) 05/17/2017: Bilateral L3-4 transforaminal ESI (good relief) 03/22/2017: Left L3-4 transforaminal ESI (good relief, best procedure) 12/30/2016: Left L4-5 and L5-S1 facet joint injection (good relief 3 weeks) 05/25/2016: Left L4-5 and L5-S1 facet joint injection (good  relief) 07/21/2015: Bilateral trochanteric bursa injection (relief for less than 1 week) 02/23/2015: Bilateral trochanteric bursa injection (relief times 5-6 weeks) 09/29/2014: Bilateral trochanteric bursa injection (good relief) 08/22/2014: Left sacroiliac joint injection (no relief) 06/13/2014: Left L4-5 and L5-S1 facet joint injection (no relief) 05/28/2014: Left trochanteric bursa injection (good relief x3 months) 11/26/2013: Right C3-4 and C4-5 facet joint injection (85% relief) 10/25/2013: Left C3-4 and C4-5 facet joint injection (85% relief)   Past Medical History:  Diagnosis Date  . Depression   . Edema random   hctz as needed  . Hypertension   . Kidney stone   . Thyroid disease     Past Surgical History:  Procedure Laterality Date  . BARIATRIC SURGERY  Nov. 2008  . ESOPHAGOGASTRODOUDENOSCOPY W/REMOVAL FOREIGN BODY  01/13/2015   Procedure: ESOPHAGOGASTRODUODENOSCOPY, FLEXIBLE, TRANSORAL; WITH REMOVAL OF FOREIGN BODY(S);  Surgeon: Lonell Cheryl Fletcher, MD;  Location: [facility];  Service: General Surgery;;  . APPENDECTOMY    . CHOLECYSTECTOMY    . GASTRIC BYPASS OPEN    . HYSTERECTOMY    . PR REMOVAL GALLBLADDER      Social History   Socioeconomic History  . Marital status: Married    Spouse name: Lamar  . Number of children: 1  . Years of education: 13+  Occupational History  . Occupation: asstiant  Tobacco Use  .  Smoking status: Never  . Smokeless tobacco: Never  . Tobacco comments:    Never smoked  Vaping Use  . Vaping status: Never Used  Substance and Sexual Activity  . Alcohol use: Not Currently    Alcohol/week: 0.0 standard drinks of alcohol    Comment: One/two month  . Drug use: No  . Sexual activity: Never    Partners: Male   Social Drivers of Health   Financial Resource Strain: Medium Risk (07/04/2023)   Received from [facility]   Overall Financial Resource Strain (CARDIA)   . Difficulty of Paying Living Expenses: Somewhat hard  Food  Insecurity: No Food Insecurity (07/04/2023)   Received from [facility]   Hunger Vital Sign   . Within the past 12 months, you worried that your food would run out before you got the money to buy more.: Never true   . Within the past 12 months, the food you bought just didn't last and you didn't have money to get more.: Never true  Transportation Needs: No Transportation Needs (07/04/2023)   Received from [facility] - [facility]   . Lack of Transportation (Medical): No   . Lack of Transportation (Non-Medical): No    Current Outpatient Medications on File Prior to Visit  Medication Sig Dispense Refill  . busPIRone  (BUSPAR ) 10 MG tablet Take 10 mg by mouth once daily       . calcitonin, salmon, (MIACALCIN) 200 unit/actuation nasal spray USE 1 SPRAY(S) INTO THE LEFT NOSTRIL ONCE DAILY 4 mL 0  . calcium citrate-vitamin D3 (CITRACAL + D MAXIMUM) 315 mg-6.25 mcg (250 unit) tablet Take 2 tablets by mouth 2 (two) times daily with meals 120 tablet 11  . desvenlafaxine  succinate (PRISTIQ ) 100 MG ER tablet Take 100 mg by mouth    . FUROsemide  (LASIX ) 20 MG tablet Take 20 mg by mouth once daily as needed    . irbesartan  (AVAPRO ) 300 MG tablet Take 150 mg by mouth once daily    . levothyroxine  (SYNTHROID , LEVOTHROID) 75 MCG tablet Take by mouth.    . methocarbamoL  (ROBAXIN ) 500 MG tablet 1/2-1 po qHS prn 30 tablet 5  . propranoloL  (INDERAL ) 20 MG tablet     . rOPINIRole  (REQUIP ) 0.25 MG tablet 0.25 mg nightly       . tamsulosin  (FLOMAX ) 0.4 mg capsule Take 0.4 mg by mouth    . topiramate  (TOPAMAX ) 100 MG tablet Take 100 mg by mouth 2 (two) times daily    . traMADoL (ULTRAM) 50 mg tablet TAKE 1/2 TO 1 (ONE-HALF TO ONE) TABLET BY MOUTH TWICE DAILY AS NEEDED 60 tablet 0  . valACYclovir (VALTREX) 1000 MG tablet Take 2,000 mg by mouth 2 (two) times daily    . alendronate (FOSAMAX) 70 MG tablet Take 1 tablet (70 mg total) by mouth every 7 (seven) days for 6 doses Take on an empty stomach with a  full glass of water . Avoid mineral or well water . Do not eat or take other medications for at least 30 minutes after dose. Sit or stand for at least 30 minutes after dose. 6 tablet 0  . gabapentin  (NEURONTIN ) 300 MG capsule 1 po qHS x 3 days, then bid (Patient not taking: Reported on 04/29/2024) 60 capsule 5  . predniSONE (DELTASONE) 5 MG tablet Take as directed - 6 day taper (Patient not taking: Reported on 04/29/2024) 21 tablet 0  . traZODone  (DESYREL ) 100 MG tablet      No current facility-administered medications on file  prior to visit.    Allergies as of 04/29/2024 - Reviewed 04/29/2024  Allergen Reaction Noted  . Losartan Other (See Comments) 07/21/2015  . Oxycodone -acetaminophen  Itching and Swelling 02/12/2013  . Penicillins Unknown   . Adhesive tape-silicones Rash 09/19/2016    ROS More than 10 system, review of system form was given to the patient to fill out and has been signed by [employer] FNP-C and scanned into the patient's chart.   Vitals Vitals:   04/29/24 0744  BP: (!) 131/90  Pulse: 64  Temp: 36.9 C (98.4 F)  TempSrc: Oral  Weight: 79.4 kg (175 lb)  Height: 177.8 cm (5' 10)  PainSc:   7  PainLoc: Back    Exam General: Alert oriented well-nourished no distress.  Cervical exam (02/23/2022) Upon inspection the rashes or scars.  Stomach rotation is moderately limited to the left the limitation to the right.  Spurling's maneuver is negative bilaterally.  Upper extremity exam She has 5/5 strength in bilateral wrist extensors, biceps, triceps, deltoids, shoulder internal/external rotators.  Sensation intact to light touch bilaterally.  Unable to elicit bilateral bicep, tricep and brachialis reflexes bilaterally.  Negative Hoffmann's bilaterally.  Lumbosacral exam (performed 12/21/2023) On inspection no rashes noted.  On palpation she has mild tenderness to palpation to the bilateral lumbosacral paraspinal musculature left greater than right.  Extension rotation  is limited and produces left-sided low back pain.  Lower extremity exam She has 5/5 strength of bilateral dorsiflexors, knee extensors, and hip flexors.  Sensation light touch is intact throughout bilateral lower extremities.  She has symmetrical patellar and Achilles reflexes.  She does not have ankle clonus.  Straight leg raise is negative bilaterally.  Range of motion of the hip joints does not produce pain.  Radiological data   Impression 1.  Acute on chronic left greater than right-sided low back pain.  Clinically she has symptoms consistent with spondylosis and facet mediated pain.  MRI from [facility] dated 01/09/2024 both images and report were reviewed. MRI LUMBAR SPINE WITHOUT CONTRAST  TECHNIQUE:  Multiplanar, multisequence MR imaging of the lumbar spine was  performed. No intravenous contrast was administered.  COMPARISON:  Lumbar MRI 02/14/2017. Abdominal radiograph 12/17/2022.  FINDINGS:  Segmentation: Normal on the comparison radiographs, the same  numbering system used on the 2018 MRI.  Alignment: Dextroconvex upper and levoconvex lower lumbar scoliosis  is mild to moderate on the radiograph last year. Stable lumbar  lordosis since 2018 with mild degenerative retrolisthesis of L3 on  L4.  Vertebrae: Background bone marrow signal within normal limits.  Chronic degenerative endplate marrow signal changes including  chronic T12 mild superior endplate compression or Schmorl's node,  stable. Similar L2 inferior endplate chronic Schmorl's node. Intact  visible sacrum. No marrow edema or evidence of acute osseous  abnormality.  Conus medullaris and cauda equina: Conus extends to the T12-L1  level. No lower spinal cord or conus signal abnormality. Generally  normal cauda equina nerve roots.  Paraspinal and other soft tissues: Postoperative changes to the  posterior paraspinal soft tissues at L3 and L4. Negative visible  abdominal viscera.   Disc levels:  Visible lower thoracic  levels through L1-L2:  Negative.   L2-L3: Chronic but progressed disc space loss. Circumferential disc  bulge although regression of the broad-based posterior component  here since 2018. Mild to moderate facet and ligament flavum  hypertrophy. No spinal stenosis. Mild chronic right lateral recess  stenosis is stable (right L3 nerve level). Mild left greater than  right  L2 foraminal stenosis is stable.   L3-L4: Chronic mild retrolisthesis. Chronic but progressed disc  space loss since 2018. Circumferential disc osteophyte complex  mildly asymmetric to the right. Chronic postoperative changes to the  lamina. Mild to moderate residual facet hypertrophy. No spinal or  convincing lateral recess stenosis. Mild left and moderate right L3  foraminal stenosis is stable.   L4-L5: Disc desiccation and mild disc space loss since 2018.  Circumferential disc osteophyte complex asymmetric to the left.  Chronic postoperative changes to the lamina moderate facet  hypertrophy also appears greater on the left. No spinal stenosis,  but asymmetric left lateral recess stenosis at the left L5 nerve  level appears to be moderate on series 8, image 20 and increased.  Moderate left and mild right L4 foraminal stenosis also increased.   L5-S1: Questionable chronic postoperative changes to the lamina.  Mild far lateral disc bulging and endplate spurring. Moderate facet  hypertrophy greater on the left. No spinal or lateral recess  stenosis. Mild bilateral L5 foraminal stenosis at this level is  stable.   IMPRESSION:  1. Chronic postoperative changes L3-L4 and L4-L5. No lumbar spinal  stenosis. But progressed since 2018 multifactorial left greater than  right foraminal and lateral recess stenosis at L4-L5. Query Left L4  and/or L5 radiculitis.  2. Progressed degeneration also at L2-L3 with increased mild  stenosis.  3. Underlying chronic mild lumbar scoliosis. No acute osseous  abnormality.    Electronically Signed    By: VEAR Hurst M.D.    On: 02/03/2024 05:50   2.  Acute on chronic posterior cervical spine pain.  There is left lateral neck pain extending to the trapezius ridge.  Clinically her symptoms seem to involve elements of facet mediated pain along with possible upper cervical radiculitis.  MRI report from [facility] dated 07/24/13 demonstrates at C3-4 facet arthropathy at C3-4 and C4-5. At C5-6 there is degenerative disc disease.Cervical spine x-ray from [facility] clinic dated 02/23/2022 revealed multilevel degenerative changes.   3.  Bilateral trochanteric bursitis. 4.  Hypertension, hypothyroidism, depression, kidney stones.   Plan 1.  Continue Tylenol  500 mg and Aleve daily. 2.  Continue tramadol 50 mg 1 tablet twice daily as needed.  She last received a prescription for 40 tablets on 02/21/2024.  We will temporarily provide her with 60 tablets monthly but will try to return her back to 40 tablets monthly after improvement of her radicular flare. 3.  Discontinue gabapentin  300 mg nightly. 4.  Continue home exercises learned in physical therapy. 5.  She has been scheduled for a left hip joint injection.   6.  She will follow-up with me 3 weeks after the injection.     BENJAMIN CHARLES CHASNIS, DO  This note was generated in part with voice recognition software and I apologize for any typographical errors that were not detected and corrected.  PCP:  Dr. Leita Adie

## 2024-04-29 NOTE — Telephone Encounter (Signed)
 Could you contact the pharmacy to find out if the Rexulti  2 mg dose makes any difference in cost? She was previously on 1 mg without having this issue.

## 2024-04-29 NOTE — Telephone Encounter (Signed)
 I called pharmacy what patient states is correct. $611.

## 2024-04-30 ENCOUNTER — Ambulatory Visit: Admitting: Psychiatry

## 2024-04-30 NOTE — Telephone Encounter (Signed)
 Tried to get a saving card and a discount card but because of insurance it is not able to use.  It is because of the insurance change is because it is expensive

## 2024-04-30 NOTE — Telephone Encounter (Signed)
 Her insurance changed.

## 2024-05-03 ENCOUNTER — Other Ambulatory Visit: Payer: Self-pay | Admitting: Psychiatry

## 2024-05-03 MED ORDER — TOPIRAMATE 200 MG PO TABS: 200.0000 mg | ORAL_TABLET | Freq: Every day | ORAL | 0 refills | Status: DC

## 2024-05-03 MED ORDER — DESVENLAFAXINE SUCCINATE ER 100 MG PO TB24: 100.0000 mg | ORAL_TABLET | Freq: Every day | ORAL | 0 refills | Status: DC

## 2024-05-03 NOTE — Telephone Encounter (Addendum)
 Please contact [facility] and cancel Pristiq , topiramate  orders.

## 2024-05-04 ENCOUNTER — Encounter: Payer: Self-pay | Admitting: Internal Medicine

## 2024-05-06 ENCOUNTER — Other Ambulatory Visit: Payer: Self-pay | Admitting: Psychiatry

## 2024-05-06 ENCOUNTER — Other Ambulatory Visit: Payer: Self-pay

## 2024-05-06 DIAGNOSIS — F331 Major depressive disorder, recurrent, moderate: Secondary | ICD-10-CM

## 2024-05-06 DIAGNOSIS — E033 Postinfectious hypothyroidism: Secondary | ICD-10-CM

## 2024-05-06 DIAGNOSIS — I10 Essential (primary) hypertension: Secondary | ICD-10-CM

## 2024-05-06 MED ORDER — LEVOTHYROXINE SODIUM 75 MCG PO TABS: 75.0000 ug | ORAL_TABLET | Freq: Every day | ORAL | 1 refills | Status: DC

## 2024-05-06 MED ORDER — IRBESARTAN 150 MG PO TABS: 150.0000 mg | ORAL_TABLET | Freq: Every day | ORAL | 1 refills | Status: AC

## 2024-05-06 MED ORDER — BUSPIRONE HCL 7.5 MG PO TABS: 7.5000 mg | ORAL_TABLET | Freq: Two times a day (BID) | ORAL | 1 refills | Status: DC

## 2024-05-06 NOTE — Telephone Encounter (Signed)
 Please contact [facility], and cancel buspirone  order. Thanks.

## 2024-05-07 DIAGNOSIS — M1612 Unilateral primary osteoarthritis, left hip: Secondary | ICD-10-CM | POA: Diagnosis not present

## 2024-05-08 NOTE — Telephone Encounter (Signed)
 Called [facility] spoke to Curtiss she stated that she would cancel the Buspirone 

## 2024-05-13 ENCOUNTER — Telehealth: Payer: Self-pay

## 2024-05-13 NOTE — Telephone Encounter (Signed)
 Celina with [employer] called stating that she is still waiting for the patients office visit notes from 08-19-2023 to present she stated that she did receive the questionnaire she is requesting that the office notes be faxed to [phone] (ATTENBETHA Juba)  Celina- [employer] [phone]   ext [phone]

## 2024-05-13 NOTE — Telephone Encounter (Signed)
 Please coordinate this with the appropriate department.

## 2024-05-18 NOTE — Progress Notes (Unsigned)
 Virtual Visit via Video Note  I connected with Toni Green on 05/23/24 at 10:00 AM EDT by a video enabled telemedicine application and verified that I am speaking with the correct person using two identifiers.  Location: Patient: home Provider: office Persons participated in the visit- patient, provider    I discussed the limitations of evaluation and management by telemedicine and the availability of in person appointments. The patient expressed understanding and agreed to proceed.    I discussed the assessment and treatment plan with the patient. The patient was provided an opportunity to ask questions and all were answered. The patient agreed with the plan and demonstrated an understanding of the instructions.   The patient was advised to call back or seek an in-person evaluation if the symptoms worsen or if the condition fails to improve as anticipated.    [name], MD    [facility] MD/PA/NP OP Progress Note  05/23/2024 12:52 PM RASHA Green  MRN:  [id number]  Chief Complaint:  Chief Complaint  Patient presents with   Follow-up   HPI:  This Is a follow-up appointment for depression, anxiety and insomnia.  She states that she has not taken rexulti  for the past 2 weeks.  She feels she is in a funk.  He cannot tell significant difference especially for the past week.  She does not want to do anything.  She does not want to see anybody or talk to anybody.  She does nothing, staying at home/ She may scroll the phone, but is not interested in doing anything.  She thought she was feeling, doing better when she was on rexulti .  She does not cook anymore. Her husband is helping for household chores.  She states that she is not interested in TMS.  She is scared as it is like science fiction.  Psychoeducation is provided regarding TMS treatment at length.  She is also not interested in switching from Pristiq  to other antidepressant .  She has been on Pristiq  for many years, and the  thought of switching makes her feel very anxious.  She has not seen either of her therapist.  She just does not want to do anything.  However, she agrees to reach out to reconnect with them, and also accomplishes any 1 household task such as washing a plate.  She reports significant worsening in initial and middle insomnia.  She feels fatigued due to insomnia.  She reports decrease in appetite due to upset stomach.  She denies SI, HI, hallucinations.  She is unable to tell how her focus is as she does not do anything.  She agrees with the plans as outlined below.  The Months of the Year Backward Test - intact   HBP- 132/80,  Substance use   Tobacco Alcohol Other substances/  Current denies Less- a couple of sips of wine yesterday Vape, CBD for insomnia, twice since Feb, coffee in the morning, decaf tea   CBD gummies at times  Past denies  6 pack on weekend    Past Treatment           Visit Diagnosis:    ICD-10-CM   1. MDD (major depressive disorder), recurrent episode, moderate (HCC)  F33.1     2. Insomnia, unspecified type  G47.00     3. Binge eating disorder, unspecified severity  F50.819     4. Iron deficiency  E61.1       Past Psychiatric History: Please see initial evaluation for full details. I have  reviewed the history. No updates at this time.     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   Claustrophobia    Complication of surgery 02/20/2015   Overview:  Dilated pouch found on EGD done 12/2014    Depression    History of colostomy reversal 11/2022   History of kidney stones    Hypertension    Hypoglycemia    Hypothyroidism    Inflammation of sacroiliac joint (HCC) 08/22/2014   Pneumonia 03/2020   PONV (postoperative nausea and vomiting)     NASUEATED/BAD HEADACHES   Seizures (HCC) 1985   x1, after labor/childbirth   Thyroid disease    Wears glasses     Past Surgical History:  Procedure Laterality Date   APPENDECTOMY  2010   BACK SURGERY  10/2005   lumbar   COLONOSCOPY WITH PROPOFOL  N/A 09/23/2016   Procedure: COLONOSCOPY WITH PROPOFOL ;  Surgeon: Rogelia Copping, MD;  Location: [facility];  Service: Endoscopy;  Laterality: N/A;   COLOSTOMY  10/22/2021   CYSTOSCOPY     x 1   EVALUATION UNDER ANESTHESIA WITH HEMORRHOIDECTOMY N/A 07/06/2020   Procedure: EXAM UNDER ANESTHESIA WITH HEMORRHOIDECTOMY;  Surgeon: Marolyn Nest, MD;  Location: [facility];  Service: General;  Laterality: N/A;   GALLBLADDER SURGERY  2009   lap   GASTRIC BYPASS  2008   roux en y   MUCOSAL ADVANCEMENT FLAP N/A 12/10/2020   Procedure: MUCOSAL ADVANCEMENT FLAP;  Surgeon: Debby Hila, MD;  Location: [facility];  Service: General;  Laterality: N/A;  60 MIN   POLYPECTOMY  09/23/2016   Procedure: POLYPECTOMY;  Surgeon: Rogelia Copping, MD;  Location: [facility];  Service: Endoscopy;;   TOTAL VAGINAL HYSTERECTOMY  1999    Family Psychiatric History: Please see initial evaluation for full details. I have reviewed the history. No updates at this time.     Family History:  Family History  Problem Relation Age of Onset   Depression Mother    Hypertension Mother    Depression Father    Hypertension Father    Depression Sister    Breast cancer Sister 66   Hypertension Maternal Grandfather    Depression Maternal Grandmother    Hypertension Maternal Grandmother    Hypertension Paternal Grandfather    Hypertension Paternal Grandmother     Social History:  Social History   Socioeconomic History   Marital status: Married    Spouse name: Not on file   Number of children: Not on file   Years of education: Not on file   Highest education level: Associate degree: occupational, Scientist, product/process development, or vocational program   Occupational History   Not on file  Tobacco Use   Smoking status: Never   Smokeless tobacco: Never  Vaping Use   Vaping status: Never Used  Substance and Sexual Activity   Alcohol use: Yes    Alcohol/week: 0.0 standard drinks of alcohol    Comment: RARE   Drug use: No   Sexual activity: Not Currently  Other Topics Concern   Not on file  Social History Narrative   Not on file   Social Drivers of Health   Financial Resource Strain: Medium Risk (07/04/2023)  Overall Financial Resource Strain (CARDIA)    Difficulty of Paying Living Expenses: Somewhat hard  Food Insecurity: No Food Insecurity (07/04/2023)   Hunger Vital Sign    Worried About Running Out of Food in the Last Year: Never true    Ran Out of Food in the Last Year: Never true  Transportation Needs: No Transportation Needs (07/04/2023)   PRAPARE - Administrator, Civil Service (Medical): No    Lack of Transportation (Non-Medical): No  Physical Activity: Unknown (07/04/2023)   Exercise Vital Sign    Days of Exercise per Week: 0 days    Minutes of Exercise per Session: Not on file  Stress: Stress Concern Present (07/04/2023)   [employer] of Occupational Health - Occupational Stress Questionnaire    Feeling of Stress : Very much  Social Connections: Moderately Integrated (07/04/2023)   Social Connection and Isolation Panel    Frequency of Communication with Friends and Family: Three times a week    Frequency of Social Gatherings with Friends and Family: Never    Attends Religious Services: Never    Database administrator or Organizations: Yes    Attends Banker Meetings: Never    Marital Status: Married    Allergies:  Allergies  Allergen Reactions   Losartan Palpitations    Could feel heartbeat (strongly) in neck   Penicillins Rash   Tape Rash    Skin irritation after back surgery white paper tape    Metabolic Disorder Labs: No results found for: HGBA1C, MPG No results  found for: PROLACTIN Lab Results  Component Value Date   CHOL 183 12/21/2023   TRIG 47 12/21/2023   HDL 100 12/21/2023   CHOLHDL 1.8 12/21/2023   LDLCALC 73 12/21/2023   LDLCALC 94 02/01/2022   Lab Results  Component Value Date   TSH 3.300 12/21/2023   TSH 2.330 02/01/2022    Therapeutic Level Labs: No results found for: LITHIUM No results found for: VALPROATE No results found for: CBMZ  Current Medications: Current Outpatient Medications  Medication Sig Dispense Refill   alendronate (FOSAMAX) 70 MG tablet Take 70 mg by mouth once a week. (Patient not taking: Reported on 03/27/2024)     Blood Glucose Monitoring Suppl (ONETOUCH VERIO REFLECT) w/Device KIT 1 each by Does not apply route daily at 6 (six) AM. 1 kit 0   busPIRone  (BUSPAR ) 7.5 MG tablet Take 1 tablet (7.5 mg total) by mouth 2 (two) times daily. 180 tablet 1   calcitonin, salmon, (MIACALCIN/FORTICAL) 200 UNIT/ACT nasal spray SMARTSIG:Both Nares     cetirizine (ZYRTEC) 10 MG tablet Take 10 mg by mouth daily.     desvenlafaxine  (PRISTIQ ) 100 MG 24 hr tablet Take 1 tablet (100 mg total) by mouth daily. 90 tablet 0   famotidine  (PEPCID ) 40 MG tablet Take by mouth. (Patient not taking: Reported on 03/27/2024)     fluticasone  (FLONASE ) 50 MCG/ACT nasal spray Place 2 sprays into both nostrils daily. 16 g 6   furosemide  (LASIX ) 20 MG tablet TAKE 1 TABLET DAILY (Patient taking differently: Take 20 mg by mouth daily as needed (fluid retention.).) 90 tablet 3   glucose blood test strip Use as instructed qid 100 each 12   ibuprofen  (ADVIL ) 800 MG tablet Take 1 tablet (800 mg total) by mouth every 8 (eight) hours as needed. 30 tablet 0   irbesartan  (AVAPRO ) 150 MG tablet Take 1 tablet (150 mg total) by mouth daily. 90 tablet 1   levothyroxine  (SYNTHROID ) 75  MCG tablet Take 1 tablet (75 mcg total) by mouth daily before breakfast. 90 tablet 1   methocarbamol  (ROBAXIN ) 500 MG tablet 1/2-1 po qHS prn     ondansetron  (ZOFRAN ) 8 MG  tablet Take 8 mg by mouth every 8 (eight) hours as needed for nausea or vomiting. (Patient not taking: Reported on 03/27/2024)     OneTouch Delica Lancets 30G MISC 1 each by Does not apply route 4 (four) times daily as needed. 100 each 12   prazosin  (MINIPRESS ) 2 MG capsule Take 1 capsule (2 mg total) by mouth at bedtime. 90 capsule 3   propranolol  (INDERAL ) 20 MG tablet Take 1 tablet (20 mg total) by mouth daily as needed. for anxiety 90 tablet 0   rOPINIRole  (REQUIP ) 4 MG tablet Take 1 tablet (4 mg total) by mouth at bedtime. 90 tablet 2   tamsulosin  (FLOMAX ) 0.4 MG CAPS capsule Take 1 capsule (0.4 mg total) by mouth daily as needed. (Patient taking differently: Take 0.4 mg by mouth daily as needed (Kidney stones).) 30 capsule 2   topiramate  (TOPAMAX ) 200 MG tablet Take 1 tablet (200 mg total) by mouth at bedtime. 90 tablet 0   traMADol (ULTRAM) 50 MG tablet Take 25-50 mg by mouth 2 (two) times daily as needed (back pain.).      valACYclovir (VALTREX) 1000 MG tablet as needed.     No current facility-administered medications for this visit.     Musculoskeletal: Strength & Muscle Tone: within normal limits Gait & Station: normal Patient leans: N/A  Psychiatric Specialty Exam: Review of Systems  There were no vitals taken for this visit.There is no height or weight on file to calculate BMI.  General Appearance: Well Groomed  Eye Contact:  Good  Speech:  Clear and Coherent  Volume:  Normal  Mood:  Depressed  Affect:  Appropriate, Congruent, and Restricted  Thought Process:  Coherent  Orientation:  Full (Time, Place, and Person)  Thought Content: Logical   Suicidal Thoughts:  No  Homicidal Thoughts:  No  Memory:  Immediate;   Good  Judgement:  Good  Insight:  Good  Psychomotor Activity:  Normal  Concentration:  Concentration: Fair and Attention Span: Fair  Recall:  Good  Fund of Knowledge: Good  Language: Good  Akathisia:  No  Handed:  Right  AIMS (if indicated): not done   Assets:  Communication Skills Desire for Improvement  ADL's:  Intact  Cognition: WNL  Sleep:  Poor   Screenings: AUDIT    Flowsheet Row Appointment from 08/08/2023 in [facility] Primary [facility] & [facility] Medicine at [facility] Most recent reading at 07/04/2023  2:07 PM Appointment from 07/05/2023 in [facility] Primary Care & Sports Medicine at [facility] Most recent reading at 07/04/2023  2:07 PM  Alcohol Use Disorder Identification Test Final Score (AUDIT) 9  9    GAD-7    Flowsheet Row Office Visit from 03/27/2024 in [facility] Primary Care & Sports Medicine at [facility] Office Visit from 12/21/2023 in [facility] Primary Care & Sports Medicine at [facility] Office Visit from 10/24/2023 in [facility] Office Visit from 06/29/2023 in [facility] Office Visit from 10/03/2022 in [facility] Primary Care & Sports Medicine at [facility]  Total GAD-7 Score 21 21 21 21 21    PHQ2-9    Flowsheet Row Office Visit from 03/27/2024 in [facility] Primary Care & Sports Medicine at [facility] Office Visit from 12/21/2023  in [facility] Primary Care & Sports Medicine at [facility] Office Visit from 10/24/2023 in [facility] Office Visit from 06/29/2023 in [facility] Office Visit from 10/03/2022 in [facility] Primary Care & Sports Medicine at [facility]  PHQ-2 Total Score 6 6 6 6 6   PHQ-9 Total Score 22 16 21 23 22    Flowsheet Row Office Visit from 06/28/2022 in [facility] Office Visit from 05/26/2022 in [facility] Office Visit from 05/05/2022 in [facility]  C-SSRS RISK CATEGORY No Risk No Risk No Risk     Assessment and Plan:  PERMELIA BAMBA is a 63 y.o. 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   1. MDD (major depressive disorder), recurrent episode, moderate (HCC) She experiences ongoing conflict with her mother, who has a medical condition. She received five surgery after hernia repair surgery related to fistula, and unerwent colostomy reversal on February 23. She experienced loss of her sister in November 2022.  She experiences recent stressors, including ongoing communication difficulties with her mother with medical illness, and feelings of being criticized.   History: Tx from Dr. Vincente. Originally on Pristiq  100 mg daily, lamotrigine  150 mg daily, topiramate  200 mg daily, propranolol  20 mg TID. Declined TMS   There has been significant worsening in anhedonia, and lack of motivation since discontinuation of rexulti  a few weeks ago due to issues related with insurance.  Treatment options has been discussed at length, which includes TMS, adjustment of her medication, specifically Pristiq  to other antidepressant/IR formula given her history of gastric bypass surgery. She agrees to reconsider this if she were to experience current mood symptoms.  Will continue other medication regimen at this time.  Will continue Pristiq  to target depression.  Will continue BuSpar , propranolol  as needed for anxiety.  She is advised to continue L-methyl folate as adjunctive treatment for depression.  She agrees to reconnect with her therapist.   2. Insomnia, unspecified type Worsening.  She agrees to reconnect with Ms. Terri for CBT I.   3. Binge eating disorder, unspecified severity Decrease in appetite.  Will continue current dose of topiramate  at this time to target  binge eating.  Will consider tapering off this medication if she were to continue decreasing in appetite now that she is not on rexulti  either (no indication of weight gain associated with antipsychotic use anymore)  4. Iron deficiency - ferritin 25 10/2023  She has been taking iron tablet with vitamin C.  Will consider recheck in several months.     Plan  Continue Pristiq  100 mg daily  - optum, generic Continue Buspar  7.5 mg twice a day (limited benefit from uptitration) Continue propranolol  20 mg daily as needed for anxiety- she declined refill Continue topiramate  200 mg daily Continue L-methylfolate 15 mg daily Continue iron sulfate 325 mg daily Next appointment-  9/22 at 8 am for 30 mins, video The form is completed- [place]  [employer] insurance. It will be faxed from the office.  - prazosin  2 mg every other night for hypertension - on tramadol - EKG QTc 394 msec on 11/2020  Past trials of medication- sertraline ,  lexapro, venlafaxine, bupropion  (palpitation), Abilify, quetiapine  (eating sweets), Vraylar  (good effect, but caused increase in appetite), Rexulti  (good effect, but unable to afford) Geodon  (increase in appetite). Trazodone , ambien , Lunesta  (severe drowsiness), lemborexant  (limited benefit), ramelteon  (limited benefit)   The patient demonstrates the following risk factors for suicide: Chronic risk factors for suicide include: psychiatric disorder of depression . Acute risk factors for suicide include:  her family suffering from medical illness . Protective factors for this patient include: positive social support, coping skills, and hope for the future. Considering these factors, the overall suicide risk at this point appears to be low. Patient is appropriate for outpatient follow up.   A total of 45 minutes was spent on the following activities during the encounter date, which includes but is not limited to: preparing to see the patient (e.g., reviewing tests and records), obtaining  and/or reviewing separately obtained history, performing a medically necessary examination or evaluation, counseling and educating the patient, family, or caregiver, ordering medications, tests, or procedures, referring and communicating with other healthcare professionals (when not reported separately), documenting clinical information in the electronic or paper health record, independently interpreting test or lab results and communicating these results to the family or caregiver, and coordinating care (when not reported separately).   Collaboration of Care: Collaboration of Care: Other reviewed notes in Epic  Patient/Guardian was advised Release of Information must be obtained prior to any record release in order to collaborate their care with an outside provider. Patient/Guardian was advised if they have not already done so to contact the registration department to sign all necessary forms in order for us  to release information regarding their care.   Consent: Patient/Guardian gives verbal consent for treatment and assignment of benefits for services provided during this visit. Patient/Guardian expressed understanding and agreed to proceed.    [name], MD 05/23/2024, 12:52 PM

## 2024-05-20 ENCOUNTER — Telehealth: Payer: Self-pay

## 2024-05-20 NOTE — Telephone Encounter (Signed)
 Copied from CRM [id number]. Topic: General - Other >> May 20, 2024 10:22 AM Lavanda D wrote: Reason for CRM: Jamie RN Case Management with [employer] is calling to verify that the fax she resent on Friday 8/1 has been received. She is requesting a status update.  Phone #: [phone] Ext: [phone] *confidential line can leave VM.*

## 2024-05-20 NOTE — Telephone Encounter (Signed)
 Pt has already been informed we do not complete disability forms.

## 2024-05-21 NOTE — Telephone Encounter (Unsigned)
 Copied from CRM [id number]. Topic: General - Other >> May 21, 2024  8:37 AM Emylou G wrote: Norleen w/[employer] adv faxed more questions for clarity - did we receive them?  Can we fill them out?  [phone] ext [phone]

## 2024-05-23 ENCOUNTER — Encounter: Payer: Self-pay | Admitting: Psychiatry

## 2024-05-23 ENCOUNTER — Telehealth: Admitting: Psychiatry

## 2024-05-23 ENCOUNTER — Telehealth: Payer: Self-pay | Admitting: Psychiatry

## 2024-05-23 DIAGNOSIS — G4700 Insomnia, unspecified: Secondary | ICD-10-CM

## 2024-05-23 DIAGNOSIS — F331 Major depressive disorder, recurrent, moderate: Secondary | ICD-10-CM

## 2024-05-23 DIAGNOSIS — E611 Iron deficiency: Secondary | ICD-10-CM

## 2024-05-23 DIAGNOSIS — F50819 Binge eating disorder, unspecified: Secondary | ICD-10-CM | POA: Diagnosis not present

## 2024-05-23 NOTE — Patient Instructions (Signed)
 Continue Pristiq  100 mg daily  Continue Buspar  7.5 mg twice a day  Continue propranolol  20 mg daily as needed for anxiety Continue topiramate  200 mg daily Continue L-methylfolate 15 mg daily Continue iron sulfate 325 mg daily Next appointment-  9/22 at 8 am

## 2024-05-23 NOTE — Telephone Encounter (Signed)
 The form has been completed. Please send it to them as requested. They also asked for accompanying notes.

## 2024-05-27 NOTE — Telephone Encounter (Signed)
 paperwork has been faxed and confirmed and was put in the scann basket.

## 2024-05-28 ENCOUNTER — Telehealth: Payer: Self-pay | Admitting: Internal Medicine

## 2024-05-28 NOTE — Telephone Encounter (Signed)
 I had completed a form for [employer] for Ms. Vanasten in June.  [employer] is reaching out to clarify her status.  Her physiatrist has stated that her back pain is such that she can perform sedentary activities.  I concur that she should be able to engage in sedentary work but I would defer her psychiatric diagnosis and limitations to her psychiatrist.

## 2024-06-02 ENCOUNTER — Encounter: Payer: Self-pay | Admitting: Internal Medicine

## 2024-06-03 ENCOUNTER — Telehealth: Payer: Self-pay | Admitting: Psychiatry

## 2024-06-03 DIAGNOSIS — F331 Major depressive disorder, recurrent, moderate: Secondary | ICD-10-CM

## 2024-06-03 NOTE — Telephone Encounter (Signed)
 Patient sent a my chart message today with concerns about medications.  Will have staff contact patient to schedule a sooner appointment with Dr. Vickey.

## 2024-06-11 ENCOUNTER — Ambulatory Visit: Admitting: Internal Medicine

## 2024-06-13 MED ORDER — VILAZODONE HCL 10 MG PO TABS: ORAL_TABLET | ORAL | 1 refills | Status: DC

## 2024-06-13 MED ORDER — DESVENLAFAXINE SUCCINATE ER 25 MG PO TB24: ORAL_TABLET | ORAL | 0 refills | Status: DC

## 2024-06-13 NOTE — Addendum Note (Signed)
 Addended by: Mauriah Mcmillen on: 06/13/2024 05:07 PM   Modules accepted: Orders

## 2024-06-13 NOTE — Telephone Encounter (Signed)
 I've spent a total time of 10 minutes providing service to this patient-generated inquiry in the MyChart message

## 2024-06-18 ENCOUNTER — Ambulatory Visit: Admitting: Internal Medicine

## 2024-07-01 DIAGNOSIS — M5126 Other intervertebral disc displacement, lumbar region: Secondary | ICD-10-CM | POA: Diagnosis not present

## 2024-07-01 DIAGNOSIS — M5416 Radiculopathy, lumbar region: Secondary | ICD-10-CM | POA: Diagnosis not present

## 2024-07-01 DIAGNOSIS — M48062 Spinal stenosis, lumbar region with neurogenic claudication: Secondary | ICD-10-CM | POA: Diagnosis not present

## 2024-07-01 DIAGNOSIS — M1612 Unilateral primary osteoarthritis, left hip: Secondary | ICD-10-CM | POA: Diagnosis not present

## 2024-07-01 DIAGNOSIS — Z79899 Other long term (current) drug therapy: Secondary | ICD-10-CM | POA: Diagnosis not present

## 2024-07-01 NOTE — Progress Notes (Unsigned)
 Virtual Visit via Video Note  I connected with Toni Green on 07/08/24 at  8:00 AM EDT by a video enabled telemedicine application and verified that I am speaking with the correct person using two identifiers.  Location: Patient: home Provider: office Persons participated in the visit- patient, provider    I discussed the limitations of evaluation and management by telemedicine and the availability of in person appointments. The patient expressed understanding and agreed to proceed.     I discussed the assessment and treatment plan with the patient. The patient was provided an opportunity to ask questions and all were answered. The patient agreed with the plan and demonstrated an understanding of the instructions.   The patient was advised to call back or seek an in-person evaluation if the symptoms worsen or if the condition fails to improve as anticipated.   [name], MD     [facility] MD/PA/NP OP Progress Note  07/08/2024 8:33 AM Toni Green  MRN:  [id number]  Chief Complaint:  Chief Complaint  Patient presents with   Follow-up   HPI:  This is a follow-up appointment for depression, anxiety and insomnia.  She states that she has been okay with Vilazodone .  She was pleased with the surprise that she could come off Pristiq  without any issues.  Although she was experiencing nausea when she started and uptitrated vilazodone , it has subsided.  She has noticed a little more anxious, and she has been taking propranolol  a few times.  She tends to feel nervous in the morning when she wakes up.  It is sometimes more intense compared to before.  She believes her depression is not worse, and may be a little better.  She feels something is different in a positive way.  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  Although she has decreasing in appetite when she was struggling with motivation, it is good now.  She reports  initial and middle insomnia.  She slept total of 8 hours in the last 4 nights.  She denies SI.  She did have a fall when she took prazosin .  She feels drowsy after taking the medication.  She has an upcoming appointment with her primary care provider tomorrow.  Although she wanted to uptitrate vilazodone  at this time, she agrees to stay at the current dose after psychoeducation is provided.  She agrees with the plans as outlined below.   161 lbs Wt Readings from Last 3 Encounters:  04/16/24 172 lb 3.2 oz (78.1 kg)  03/27/24 173 lb 2 oz (78.5 kg)  12/21/23 177 lb 2 oz (80.3 kg)     Substance use   Tobacco Alcohol Other substances/  Current denies Less- a couple of sips of wine yesterday Vape, CBD for insomnia, twice since Feb, coffee in the morning, decaf tea   CBD gummies at times  Past denies  6 pack on weekend    Past Treatment           Visit Diagnosis:    ICD-10-CM   1. MDD (major depressive disorder), recurrent episode, moderate (HCC)  F33.1     2. Anxiety state  F41.1     3. Insomnia, unspecified type  G47.00       Past Psychiatric History: Please see initial evaluation for full details. I have reviewed the history. No updates at this time.     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   Claustrophobia    Complication of surgery 02/20/2015   Overview:  Dilated pouch found on EGD done 12/2014    Depression    History of colostomy reversal 11/2022   History of kidney stones    Hypertension    Hypoglycemia    Hypothyroidism    Inflammation of sacroiliac joint (HCC) 08/22/2014   Pneumonia 03/2020   PONV (postoperative nausea and vomiting)    NASUEATED/BAD HEADACHES   Seizures (HCC) 1985   x1, after labor/childbirth   Thyroid disease    Wears  glasses     Past Surgical History:  Procedure Laterality Date   APPENDECTOMY  2010   BACK SURGERY  10/2005   lumbar   COLONOSCOPY WITH PROPOFOL  N/A 09/23/2016   Procedure: COLONOSCOPY WITH PROPOFOL ;  Surgeon: Rogelia Copping, MD;  Location: [facility];  Service: Endoscopy;  Laterality: N/A;   COLOSTOMY  10/22/2021   CYSTOSCOPY     x 1   EVALUATION UNDER ANESTHESIA WITH HEMORRHOIDECTOMY N/A 07/06/2020   Procedure: EXAM UNDER ANESTHESIA WITH HEMORRHOIDECTOMY;  Surgeon: Marolyn Nest, MD;  Location: [facility];  Service: General;  Laterality: N/A;   GALLBLADDER SURGERY  2009   lap   GASTRIC BYPASS  2008   roux en y   MUCOSAL ADVANCEMENT FLAP N/A 12/10/2020   Procedure: MUCOSAL ADVANCEMENT FLAP;  Surgeon: Debby Hila, MD;  Location: [facility];  Service: General;  Laterality: N/A;  60 MIN   POLYPECTOMY  09/23/2016   Procedure: POLYPECTOMY;  Surgeon: Rogelia Copping, MD;  Location: [facility];  Service: Endoscopy;;   TOTAL VAGINAL HYSTERECTOMY  1999    Family Psychiatric History: Please see initial evaluation for full details. I have reviewed the history. No updates at this time.     Family History:  Family History  Problem Relation Age of Onset   Depression Mother    Hypertension Mother    Depression Father    Hypertension Father    Depression Sister    Breast cancer Sister 51   Hypertension Maternal Grandfather    Depression Maternal Grandmother    Hypertension Maternal Grandmother    Hypertension Paternal Grandfather    Hypertension Paternal Grandmother     Social History:  Social History   Socioeconomic History   Marital status: Married    Spouse name: Not on file   Number of children: Not on file   Years of education: Not on file   Highest education level: Associate degree: occupational, Scientist, product/process development, or vocational program  Occupational History   Not on file  Tobacco Use   Smoking status: Never   Smokeless tobacco: Never  Vaping Use    Vaping status: Never Used  Substance and Sexual Activity   Alcohol use: Yes    Alcohol/week: 0.0 standard drinks of alcohol    Comment: RARE   Drug use: No   Sexual activity: Not Currently  Other Topics Concern   Not on file  Social History Narrative   Not on file   Social Drivers of Health   Financial Resource Strain: Medium Risk (07/04/2023)   Overall Financial Resource Strain (CARDIA)    Difficulty of Paying Living Expenses: Somewhat hard  Food Insecurity: No Food  Insecurity (07/04/2023)   Hunger Vital Sign    Worried About Running Out of Food in the Last Year: Never true    Ran Out of Food in the Last Year: Never true  Transportation Needs: No Transportation Needs (07/04/2023)   PRAPARE - Administrator, Civil Service (Medical): No    Lack of Transportation (Non-Medical): No  Physical Activity: Unknown (07/04/2023)   Exercise Vital Sign    Days of Exercise per Week: 0 days    Minutes of Exercise per Session: Not on file  Stress: Stress Concern Present (07/04/2023)   [employer] of Occupational Health - Occupational Stress Questionnaire    Feeling of Stress : Very much  Social Connections: Moderately Integrated (07/04/2023)   Social Connection and Isolation Panel    Frequency of Communication with Friends and Family: Three times a week    Frequency of Social Gatherings with Friends and Family: Never    Attends Religious Services: Never    Database administrator or Organizations: Yes    Attends Banker Meetings: Never    Marital Status: Married    Allergies:  Allergies  Allergen Reactions   Losartan Palpitations    Could feel heartbeat (strongly) in neck   Penicillins Rash   Tape Rash    Skin irritation after back surgery white paper tape    Metabolic Disorder Labs: No results found for: HGBA1C, MPG No results found for: PROLACTIN Lab Results  Component Value Date   CHOL 183 12/21/2023   TRIG 47 12/21/2023   HDL 100  12/21/2023   CHOLHDL 1.8 12/21/2023   LDLCALC 73 12/21/2023   LDLCALC 94 02/01/2022   Lab Results  Component Value Date   TSH 3.300 12/21/2023   TSH 2.330 02/01/2022    Therapeutic Level Labs: No results found for: LITHIUM No results found for: VALPROATE No results found for: CBMZ  Current Medications: Current Outpatient Medications  Medication Sig Dispense Refill   traZODone  (DESYREL ) 50 MG tablet Take 0.5-1 tablets (25-50 mg total) by mouth at bedtime. 30 tablet 1   [START ON 08/12/2024] Vilazodone  HCl (VIIBRYD ) 10 MG TABS Take 1 tablet (10 mg total) by mouth daily. 30 tablet 0   alendronate (FOSAMAX) 70 MG tablet Take 70 mg by mouth once a week. (Patient not taking: Reported on 03/27/2024)     Blood Glucose Monitoring Suppl (ONETOUCH VERIO REFLECT) w/Device KIT 1 each by Does not apply route daily at 6 (six) AM. 1 kit 0   busPIRone  (BUSPAR ) 7.5 MG tablet Take 1 tablet (7.5 mg total) by mouth 2 (two) times daily. 180 tablet 1   calcitonin, salmon, (MIACALCIN/FORTICAL) 200 UNIT/ACT nasal spray SMARTSIG:Both Nares     cetirizine (ZYRTEC) 10 MG tablet Take 10 mg by mouth daily.     famotidine  (PEPCID ) 40 MG tablet Take by mouth. (Patient not taking: Reported on 03/27/2024)     fluticasone  (FLONASE ) 50 MCG/ACT nasal spray Place 2 sprays into both nostrils daily. 16 g 6   furosemide  (LASIX ) 20 MG tablet TAKE 1 TABLET DAILY (Patient taking differently: Take 20 mg by mouth daily as needed (fluid retention.).) 90 tablet 3   glucose blood test strip Use as instructed qid 100 each 12   ibuprofen  (ADVIL ) 800 MG tablet Take 1 tablet (800 mg total) by mouth every 8 (eight) hours as needed. 30 tablet 0   irbesartan  (AVAPRO ) 150 MG tablet Take 1 tablet (150 mg total) by mouth daily. 90 tablet 1   levothyroxine  (  SYNTHROID ) 75 MCG tablet Take 1 tablet (75 mcg total) by mouth daily before breakfast. 90 tablet 1   methocarbamol  (ROBAXIN ) 500 MG tablet 1/2-1 po qHS prn     ondansetron  (ZOFRAN ) 8  MG tablet Take 8 mg by mouth every 8 (eight) hours as needed for nausea or vomiting. (Patient not taking: Reported on 03/27/2024)     OneTouch Delica Lancets 30G MISC 1 each by Does not apply route 4 (four) times daily as needed. 100 each 12   prazosin  (MINIPRESS ) 2 MG capsule Take 1 capsule (2 mg total) by mouth at bedtime. (Patient not taking: Reported on 07/08/2024) 90 capsule 3   propranolol  (INDERAL ) 20 MG tablet Take 1 tablet (20 mg total) by mouth daily as needed. for anxiety 90 tablet 0   rOPINIRole  (REQUIP ) 4 MG tablet Take 1 tablet (4 mg total) by mouth at bedtime. 90 tablet 2   tamsulosin  (FLOMAX ) 0.4 MG CAPS capsule Take 1 capsule (0.4 mg total) by mouth daily as needed. (Patient taking differently: Take 0.4 mg by mouth daily as needed (Kidney stones).) 30 capsule 2   [START ON 08/01/2024] topiramate  (TOPAMAX ) 200 MG tablet Take 1 tablet (200 mg total) by mouth at bedtime. 90 tablet 0   traMADol (ULTRAM) 50 MG tablet Take 25-50 mg by mouth 2 (two) times daily as needed (back pain.).      valACYclovir (VALTREX) 1000 MG tablet as needed.     Vilazodone  HCl (VIIBRYD ) 10 MG TABS 5 mg daily for two weeks, then 10 mg daily 30 tablet 1   No current facility-administered medications for this visit.     Musculoskeletal: Strength & Muscle Tone: N/A Gait & Station: N/A Patient leans: N/A  Psychiatric Specialty Exam: Review of Systems  Psychiatric/Behavioral:  Positive for dysphoric mood and sleep disturbance. Negative for agitation, behavioral problems, confusion, decreased concentration, hallucinations, self-injury and suicidal ideas. The patient is nervous/anxious. The patient is not hyperactive.   All other systems reviewed and are negative.   There were no vitals taken for this visit.There is no height or weight on file to calculate BMI.  General Appearance: Well Groomed  Eye Contact:  Good  Speech:  Clear and Coherent  Volume:  Normal  Mood:  Anxious  Affect:  Appropriate,  Congruent, and brighter  Thought Process:  Coherent  Orientation:  Full (Time, Place, and Person)  Thought Content: Logical   Suicidal Thoughts:  No  Homicidal Thoughts:  No  Memory:  Immediate;   Good  Judgement:  Good  Insight:  Good  Psychomotor Activity:  Normal  Concentration:  Concentration: Good and Attention Span: Good  Recall:  Good  Fund of Knowledge: Good  Language: Good  Akathisia:  No  Handed:  Right  AIMS (if indicated): not done  Assets:  Communication Skills Desire for Improvement  ADL's:  Intact  Cognition: WNL  Sleep:  Poor   Screenings: AUDIT    Flowsheet Row Appointment from 08/08/2023 in [facility] at [facility] Most recent reading at 07/04/2023  2:07 PM Appointment from 07/05/2023 in [facility] Primary Care & Sports Medicine at [facility] Most recent reading at 07/04/2023  2:07 PM  Alcohol Use Disorder Identification Test Final Score (AUDIT) 9  9    GAD-7    Flowsheet Row Office Visit from 03/27/2024 in [facility] at [facility] Office Visit from 12/21/2023 in [facility] Primary Care & Sports Medicine at [facility]  Visit from 10/24/2023 in [facility] Office Visit from 06/29/2023 in [facility] Office Visit from 10/03/2022 in [facility] Primary Care & Sports Medicine at [facility]  Total GAD-7 Score 21 21 21 21 21    PHQ2-9    Flowsheet Row Office Visit from 03/27/2024 in [facility] Medicine at [facility] Office Visit from 12/21/2023 in [facility] Primary Care & Sports Medicine at [facility] Office Visit from 10/24/2023 in [facility] Office Visit from 06/29/2023 in [facility] Office Visit from 10/03/2022 in [facility] Primary Care & Sports Medicine at [facility]   PHQ-2 Total Score 6 6 6 6 6   PHQ-9 Total Score 22 16 21 23 22    Flowsheet Row Office Visit from 06/28/2022 in [facility] Office Visit from 05/26/2022 in [facility] Office Visit from 05/05/2022 in [facility]  C-SSRS RISK CATEGORY No Risk No Risk No Risk     Assessment and Plan:  Toni Green is a 63 y.o. 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   1. MDD (major depressive disorder), recurrent episode, moderate (HCC) 2. Anxiety state She experiences ongoing conflict with her mother, who has a medical condition. She received five surgery after hernia repair surgery related to fistula, and unerwent colostomy reversal on February 23. She experienced loss of her sister in November 2022.  She experiences recent stressors, including ongoing communication difficulties with her mother with medical illness, and feelings of being criticized.   History: Tx from Dr. Vincente. Originally on Pristiq  100 mg daily, lamotrigine  150 mg daily, topiramate  200 mg daily, propranolol  20 mg TID. Declined TMS   The exam is notable for brighter affect, and she was successfully able to cross-taper from Pristiq  to Viibryd .  While she reports slight worsening in anxiety, she has been able to be a little more active on house hold tasks with improvement in motivation.  She may benefit from further uptitration, will stay at the current dose at this time to reduce the risk of worsening in anxiety, and to see if the medication exerts more benefit in the next few weeks.  Will continue BuSpar   for anxiety.  Will continue L-methylfolate as an active treatment for depression.  Will continue propranolol  as needed for anxiety.   3. Insomnia, unspecified type Worsening.  Will start trazodone  as needed for insomnia.  It was previously discussed to reconnect with Ms. Terri for CBT-I.  Will follow-up at the next visit.   3. Binge eating disorder, unspecified severity Stable.  Will continue current dose of topiramate  at this time to target binge eating.   # bradycardia She has bradycardia at her recent visit with the provider.  She takes propranolol  as needed for anxiety.  Noted that she reports drowsiness and had a fall when she took prazosin . She agrees to discuss these issues with her primary care provider on her tomorrow's visit.  Will continue propranolol  cautiously at this time.    4. Iron deficiency - ferritin 25 10/2023  She has been taking iron tablet with vitamin C.  Will  consider recheck in several months.     Plan  Continue Vilazodone  10 mg daily Continue Buspar  7.5 mg twice a day Continue propranolol  20 mg daily as needed for anxiety- she declined refill Continue topiramate  200 mg daily Continue L-methylfolate 15 mg daily Continue iron sulfate 325 mg daily Start trazodone  25-50 mg at night as needed for insomnia Next appointment-  11/12 at 9 am, video, waitlist for sooner visit in mid Oct The form is completed- [place]  [employer] insurance. It will be faxed from the office.  - prazosin  2 mg every other night for hypertension - on tramadol - EKG QTc 394 msec on 11/2020   Past trials of medication- sertraline , lexapro, venlafaxine, bupropion  (palpitation), Abilify, quetiapine  (eating sweets), Vraylar  (good effect, but caused increase in appetite), Rexulti  (good effect, but unable to afford) Geodon  (increase in appetite). Trazodone , ambien , Lunesta  (severe drowsiness), lemborexant  (limited benefit), ramelteon  (limited benefit)   The patient demonstrates the following risk factors  for suicide: Chronic risk factors for suicide include: psychiatric disorder of depression . Acute risk factors for suicide include:  her family suffering from medical illness . Protective factors for this patient include: positive social support, coping skills, and hope for the future. Considering these factors, the overall suicide risk at this point appears to be low. Patient is appropriate for outpatient follow up.   Collaboration of Care: Collaboration of Care: Other reviewed notes in Epic  Patient/Guardian was advised Release of Information must be obtained prior to any record release in order to collaborate their care with an outside provider. Patient/Guardian was advised if they have not already done so to contact the registration department to sign all necessary forms in order for us  to release information regarding their care.   Consent: Patient/Guardian gives verbal consent for treatment and assignment of benefits for services provided during this visit. Patient/Guardian expressed understanding and agreed to proceed.    [name], MD 07/08/2024, 8:33 AM

## 2024-07-08 ENCOUNTER — Telehealth (INDEPENDENT_AMBULATORY_CARE_PROVIDER_SITE_OTHER): Admitting: Psychiatry

## 2024-07-08 ENCOUNTER — Encounter: Payer: Self-pay | Admitting: Psychiatry

## 2024-07-08 DIAGNOSIS — F331 Major depressive disorder, recurrent, moderate: Secondary | ICD-10-CM

## 2024-07-08 DIAGNOSIS — G4700 Insomnia, unspecified: Secondary | ICD-10-CM | POA: Diagnosis not present

## 2024-07-08 DIAGNOSIS — F411 Generalized anxiety disorder: Secondary | ICD-10-CM | POA: Diagnosis not present

## 2024-07-08 MED ORDER — TOPIRAMATE 200 MG PO TABS: 200.0000 mg | ORAL_TABLET | Freq: Every day | ORAL | 0 refills | Status: DC

## 2024-07-08 MED ORDER — VILAZODONE HCL 10 MG PO TABS: 10.0000 mg | ORAL_TABLET | Freq: Every day | ORAL | 0 refills | Status: DC

## 2024-07-08 MED ORDER — TRAZODONE HCL 50 MG PO TABS: 25.0000 mg | ORAL_TABLET | Freq: Every day | ORAL | 1 refills | Status: DC

## 2024-07-08 NOTE — Patient Instructions (Signed)
 Continue Vilazodone  10 mg daily Continue Buspar  7.5 mg twice a day Continue propranolol  20 mg daily as needed for anxiety Continue topiramate  200 mg daily Continue L-methylfolate 15 mg daily Continue iron sulfate 325 mg daily Start trazodone  25-50 mg at night as needed for insomnia Next appointment-  11/12 at 9 am,

## 2024-07-09 ENCOUNTER — Encounter: Payer: Self-pay | Admitting: Internal Medicine

## 2024-07-09 ENCOUNTER — Ambulatory Visit (INDEPENDENT_AMBULATORY_CARE_PROVIDER_SITE_OTHER): Admitting: Internal Medicine

## 2024-07-09 VITALS — BP 108/68 | HR 61 | Ht 70.0 in | Wt 163.0 lb

## 2024-07-09 DIAGNOSIS — F3175 Bipolar disorder, in partial remission, most recent episode depressed: Secondary | ICD-10-CM | POA: Insufficient documentation

## 2024-07-09 DIAGNOSIS — Z23 Encounter for immunization: Secondary | ICD-10-CM

## 2024-07-09 DIAGNOSIS — I10 Essential (primary) hypertension: Secondary | ICD-10-CM

## 2024-07-09 NOTE — Assessment & Plan Note (Addendum)
 Blood pressure is well controlled on Avapro  and Prazosin .   She has noted frequent episodes of lightheadedness and presycope after taking Prazosin  so has taken it this week. BP at home 125/80 range. Recommend stopping Prazosin  and monitoring BP.  If presyncope continues, will need further evaluation.

## 2024-07-09 NOTE — Patient Instructions (Signed)
 Stop Prazosin  and monitor blood pressure and symptoms.

## 2024-07-09 NOTE — Progress Notes (Signed)
 Date:  07/09/2024   Name:  Toni Green   DOB:  1961/08/02   MRN:  [id number]   Chief Complaint: Hypertension  Hypertension This is a chronic problem. The problem is controlled. Pertinent negatives include no chest pain, headaches, palpitations or shortness of breath.    Review of Systems  Constitutional:  Negative for fatigue and unexpected weight change.  HENT:  Negative for trouble swallowing.   Eyes:  Negative for visual disturbance.  Respiratory:  Negative for cough, chest tightness, shortness of breath and wheezing.   Cardiovascular:  Negative for chest pain, palpitations and leg swelling.  Gastrointestinal:  Negative for abdominal pain, constipation and diarrhea.  Musculoskeletal:  Negative for arthralgias and myalgias.  Neurological:  Negative for dizziness, weakness, light-headedness and headaches.     Lab Results  Component Value Date   NA 144 12/21/2023   K 4.0 12/21/2023   CO2 22 12/21/2023   GLUCOSE 89 12/21/2023   BUN 10 12/21/2023   CREATININE 0.67 12/21/2023   CALCIUM 8.9 12/21/2023   EGFR 99 12/21/2023   GFRNONAA 77 01/06/2020   Lab Results  Component Value Date   CHOL 183 12/21/2023   HDL 100 12/21/2023   LDLCALC 73 12/21/2023   TRIG 47 12/21/2023   CHOLHDL 1.8 12/21/2023   Lab Results  Component Value Date   TSH 3.300 12/21/2023   No results found for: HGBA1C Lab Results  Component Value Date   WBC 7.7 12/21/2023   HGB 13.2 12/21/2023   HCT 40.2 12/21/2023   MCV 95 12/21/2023   PLT 263 12/21/2023   Lab Results  Component Value Date   ALT 14 12/21/2023   AST 16 12/21/2023   ALKPHOS 99 12/21/2023   BILITOT 0.3 12/21/2023   Lab Results  Component Value Date   VD25OH 33.6 12/21/2023     Patient Active Problem List   Diagnosis Date Noted   Mixed hyperlipidemia 02/01/2022   Reactive hypoglycemia 12/21/2021   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   Moderately severe major depression (HCC) 01/06/2021   S/P total hysterectomy  11/25/2020   Hemorrhoids    Anxiety state 10/14/2019   Binge eating disorder 10/14/2019   Restless leg syndrome 11/30/2018   S/P gastric bypass 08/07/2017   Special screening for malignant neoplasms, colon    Benign neoplasm of ascending colon    Polyp of sigmoid colon    Menopause syndrome 07/26/2016   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   Essential (primary) hypertension 07/20/2015   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 disorder 07/20/2015   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   DDD (degenerative disc disease), cervical 03/17/2014   Calculus of kidney 02/13/2013    Allergies  Allergen Reactions   Losartan Palpitations    Could feel heartbeat (strongly) in neck   Penicillins Rash   Tape Rash    Skin irritation after back surgery white paper tape    Past Surgical History:  Procedure Laterality Date   APPENDECTOMY  2010   BACK SURGERY  10/2005   lumbar   COLONOSCOPY WITH PROPOFOL  N/A 09/23/2016   Procedure: COLONOSCOPY WITH PROPOFOL ;  Surgeon: Rogelia Copping, MD;  Location: [facility];  Service: Endoscopy;  Laterality: N/A;   COLOSTOMY  10/22/2021   CYSTOSCOPY     x 1   EVALUATION UNDER ANESTHESIA WITH HEMORRHOIDECTOMY N/A 07/06/2020   Procedure: EXAM UNDER ANESTHESIA WITH HEMORRHOIDECTOMY;  Surgeon: Marolyn Nest, MD;  Location: [facility];  Service: General;  Laterality: N/A;   GALLBLADDER SURGERY  2009   lap   GASTRIC BYPASS  2008   roux en y   MUCOSAL ADVANCEMENT FLAP N/A 12/10/2020   Procedure: MUCOSAL ADVANCEMENT FLAP;  Surgeon: Debby Hila, MD;  Location: [facility];  Service: General;  Laterality: N/A;  60 MIN   POLYPECTOMY  09/23/2016   Procedure: POLYPECTOMY;  Surgeon: Rogelia Copping, MD;  Location: [facility];  Service: Endoscopy;;   TOTAL VAGINAL HYSTERECTOMY  1999    Social History   Tobacco Use   Smoking status: Never   Smokeless tobacco: Never  Vaping Use   Vaping status:  Never Used  Substance Use Topics   Alcohol use: Yes    Alcohol/week: 0.0 standard drinks of alcohol    Comment: RARE   Drug use: No     Medication list has been reviewed and updated.  Current Meds  Medication Sig   alendronate (FOSAMAX) 70 MG tablet Take 70 mg by mouth once a week.   Blood Glucose Monitoring Suppl (ONETOUCH VERIO REFLECT) w/Device KIT 1 each by Does not apply route daily at 6 (six) AM.   busPIRone  (BUSPAR ) 7.5 MG tablet Take 1 tablet (7.5 mg total) by mouth 2 (two) times daily.   calcitonin, salmon, (MIACALCIN/FORTICAL) 200 UNIT/ACT nasal spray SMARTSIG:Both Nares   cetirizine (ZYRTEC) 10 MG tablet Take 10 mg by mouth daily.   famotidine  (PEPCID ) 40 MG tablet Take by mouth.   fluticasone  (FLONASE ) 50 MCG/ACT nasal spray Place 2 sprays into both nostrils daily.   furosemide  (LASIX ) 20 MG tablet TAKE 1 TABLET DAILY (Patient taking differently: Take 20 mg by mouth daily as needed (fluid retention.).)   glucose blood test strip Use as instructed qid   ibuprofen  (ADVIL ) 800 MG tablet Take 1 tablet (800 mg total) by mouth every 8 (eight) hours as needed.   irbesartan  (AVAPRO ) 150 MG tablet Take 1 tablet (150 mg total) by mouth daily.   levothyroxine  (SYNTHROID ) 75 MCG tablet Take 1 tablet (75 mcg total) by mouth daily before breakfast.   methocarbamol  (ROBAXIN ) 500 MG tablet 1/2-1 po qHS prn   ondansetron  (ZOFRAN ) 8 MG tablet Take 8 mg by mouth every 8 (eight) hours as needed for nausea or vomiting.   OneTouch Delica Lancets 30G MISC 1 each by Does not apply route 4 (four) times daily as needed.   propranolol  (INDERAL ) 20 MG tablet Take 1 tablet (20 mg total) by mouth daily as needed. for anxiety   rOPINIRole  (REQUIP ) 4 MG tablet Take 1 tablet (4 mg total) by mouth at bedtime.   tamsulosin  (FLOMAX ) 0.4 MG CAPS capsule Take 1 capsule (0.4 mg total) by mouth daily as needed. (Patient taking differently: Take 0.4 mg by mouth daily as needed (Kidney stones).)   [START ON  08/01/2024] topiramate  (TOPAMAX ) 200 MG tablet Take 1 tablet (200 mg total) by mouth at bedtime.   traMADol (ULTRAM) 50 MG tablet Take 25-50 mg by mouth 2 (two) times daily as needed (back pain.).    traZODone  (DESYREL ) 50 MG tablet Take 0.5-1 tablets (25-50 mg total) by mouth at bedtime.   valACYclovir (VALTREX) 1000 MG tablet as needed.   Vilazodone  HCl (VIIBRYD ) 10 MG TABS 5 mg daily for two weeks, then 10 mg daily   [START ON 08/12/2024] Vilazodone  HCl (VIIBRYD ) 10 MG TABS Take 1 tablet (10 mg total) by mouth daily.   [DISCONTINUED] prazosin  (MINIPRESS ) 2 MG capsule Take 1 capsule (2 mg total) by mouth at bedtime. (Patient taking differently: Take 2 mg by mouth.  Every 2 days)       07/09/2024    9:17 AM 03/27/2024    3:32 PM 12/21/2023    9:18 AM 10/24/2023    9:14 AM  GAD 7 : Generalized Anxiety Score  Nervous, Anxious, on Edge 3 3 3    Control/stop worrying 3 3 3    Worry too much - different things 3 3 3    Trouble relaxing 3 3 3    Restless 3 3 3    Easily annoyed or irritable 3 3 3    Afraid - awful might happen 3 3 3    Total GAD 7 Score 21 21 21    Anxiety Difficulty Very difficult Extremely difficult Somewhat difficult      Information is confidential and restricted. Go to Review Flowsheets to unlock data.       07/09/2024    9:16 AM 03/27/2024    3:32 PM 12/21/2023    9:16 AM  Depression screen PHQ 2/9  Decreased Interest 3 3 3   Down, Depressed, Hopeless 3 3 3   PHQ - 2 Score 6 6 6   Altered sleeping 3 3 3   Tired, decreased energy 3 3 3   Change in appetite 0 1 0  Feeling bad or failure about yourself  3 3 0  Trouble concentrating 3 3 3   Moving slowly or fidgety/restless 0 3 1  Suicidal thoughts 0 0 0  PHQ-9 Score 18 22 16   Difficult doing work/chores Very difficult Extremely dIfficult Not difficult at all    BP Readings from Last 3 Encounters:  07/09/24 108/68  03/27/24 118/78  12/21/23 134/82    Physical Exam Vitals and nursing note reviewed.  Constitutional:       General: She is not in acute distress.    Appearance: She is well-developed.  HENT:     Head: Normocephalic and atraumatic.  Cardiovascular:     Rate and Rhythm: Normal rate and regular rhythm.  Pulmonary:     Effort: Pulmonary effort is normal. No respiratory distress.     Breath sounds: No wheezing or rhonchi.  Musculoskeletal:     Cervical back: Normal range of motion.  Lymphadenopathy:     Cervical: No cervical adenopathy.  Skin:    General: Skin is warm and dry.     Findings: No rash.  Neurological:     Mental Status: She is alert and oriented to person, place, and time.  Psychiatric:        Mood and Affect: Mood normal.        Behavior: Behavior normal.     Wt Readings from Last 3 Encounters:  07/09/24 163 lb (73.9 kg)  03/27/24 173 lb 2 oz (78.5 kg)  12/21/23 177 lb 2 oz (80.3 kg)    BP 108/68   Pulse 61   Ht 5' 10 (1.778 m)   Wt 163 lb (73.9 kg)   SpO2 96%   BMI 23.39 kg/m   Assessment and Plan:  Problem List Items Addressed This Visit       Unprioritized   Essential (primary) hypertension - Primary (Chronic)   Blood pressure is well controlled on Avapro  and Prazosin .   She has noted frequent episodes of lightheadedness and presycope after taking Prazosin  so has taken it this week. BP at home 125/80 range. Recommend stopping Prazosin  and monitoring BP.  If presyncope continues, will need further evaluation.       Other Visit Diagnoses       Encounter for immunization       Relevant Orders  Flu vaccine trivalent PF, 6mos and older(Flulaval,Afluria,Fluarix,Fluzone) (Completed)       Return in about 6 months (around 01/06/2025) for [facility] and CPX  Dr. Lemon.    Leita HILARIO Adie, MD [facility] [place]

## 2024-07-11 NOTE — Telephone Encounter (Signed)
 Trazodone  has been sent to the pharmacy. Could you contact the pharmacy to verify this, and give the patient an update? Thanks.

## 2024-07-17 DIAGNOSIS — M5416 Radiculopathy, lumbar region: Secondary | ICD-10-CM | POA: Diagnosis not present

## 2024-07-17 DIAGNOSIS — M48062 Spinal stenosis, lumbar region with neurogenic claudication: Secondary | ICD-10-CM | POA: Diagnosis not present

## 2024-07-17 DIAGNOSIS — M5126 Other intervertebral disc displacement, lumbar region: Secondary | ICD-10-CM | POA: Diagnosis not present

## 2024-07-19 NOTE — Telephone Encounter (Signed)
 Have left several messages for patient.

## 2024-07-22 ENCOUNTER — Other Ambulatory Visit (HOSPITAL_COMMUNITY): Payer: Self-pay | Admitting: Psychiatry

## 2024-07-22 ENCOUNTER — Telehealth: Payer: Self-pay

## 2024-07-22 MED ORDER — TRAZODONE HCL 50 MG PO TABS: 25.0000 mg | ORAL_TABLET | Freq: Every day | ORAL | 1 refills | Status: DC

## 2024-07-22 NOTE — Telephone Encounter (Signed)
 Patient has been trying to get her medication traZODone  (DESYREL ) 50 MG tablet it was sent to [facility] home delivery and she has not used this pharmacy in  along time I called the pharmacy spoke to [place] and requested to cancel the Rx for the traZODone  (DESYREL ) 50 MG tablet please send to    Preferred [facility] [address] [place], [place] - [address] Phone: [phone]  Fax: [phone]

## 2024-07-22 NOTE — Telephone Encounter (Signed)
 sent

## 2024-08-05 ENCOUNTER — Encounter: Payer: Self-pay | Admitting: Psychiatry

## 2024-08-05 ENCOUNTER — Telehealth (INDEPENDENT_AMBULATORY_CARE_PROVIDER_SITE_OTHER): Admitting: Psychiatry

## 2024-08-05 DIAGNOSIS — F411 Generalized anxiety disorder: Secondary | ICD-10-CM

## 2024-08-05 DIAGNOSIS — F331 Major depressive disorder, recurrent, moderate: Secondary | ICD-10-CM

## 2024-08-05 DIAGNOSIS — G4700 Insomnia, unspecified: Secondary | ICD-10-CM | POA: Diagnosis not present

## 2024-08-05 MED ORDER — VILAZODONE HCL 20 MG PO TABS: 20.0000 mg | ORAL_TABLET | Freq: Every day | ORAL | 0 refills | Status: DC

## 2024-08-05 NOTE — Patient Instructions (Signed)
 Increase Vilazodone  20 mg at night  Continue Buspar  7.5 mg twice a day Continue propranolol  20 mg daily as needed for anxiety Continue topiramate  200 mg daily Continue L-methylfolate 15 mg daily Continue iron sulfate 325 mg daily Continue trazodone  25-50 mg at night as needed for insomnia Next appointment-  11/12 at 9 am

## 2024-08-05 NOTE — Progress Notes (Signed)
 Virtual Visit via Video Note  I connected with Toni Green on 08/05/24 at  3:00 PM EDT by a video enabled telemedicine application and verified that I am speaking with the correct person using two identifiers.  Location: Patient: home Provider: office Persons participated in the visit- patient, provider    I discussed the limitations of evaluation and management by telemedicine and the availability of in person appointments. The patient expressed understanding and agreed to proceed.    I discussed the assessment and treatment plan with the patient. The patient was provided an opportunity to ask questions and all were answered. The patient agreed with the plan and demonstrated an understanding of the instructions.   The patient was advised to call back or seek an in-person evaluation if the symptoms worsen or if the condition fails to improve as anticipated.    [name], MD    [facility] MD/PA/NP OP Progress Note  08/05/2024 3:40 PM Toni Green  MRN:  [id number]  Chief Complaint:  Chief Complaint  Patient presents with   Follow-up   HPI:  This is a follow-up appointment for depression, anxiety and insomnia.  This appointment is made sooner due to the patient concern.  She states that she has been feeling down, and is crying for no reason.  She feels nervous, and feels there is a nut in stomach due to anxiety.  She states that this is exactly the way she was feeling when she came off from Pristiq  years ago.  She states that a lot of things has been going on.  Her mother is in the hospital as well as her brother-in-law.  However, she always seems to have a lot going on, and she has not been able to handle it as she used to anymore.  She had a breakdown and was crying all day.  She was doing good until last Thursday.  She has been feeling on edge and shaky.  She has been able to sleep 5 hours when she takes trazodone .  However, she feels a little drowsy in the morning.  She takes  it around 11 PM.  She denies binge eating as she has decrease in appetite.  She denies concern about her weight as she is hoping for her weight to be around 150.  Although it has been rough, she denies SI.  She denies hallucinations.  She agrees with the plans as outlined below.   158 lbs Wt Readings from Last 3 Encounters:  07/09/24 163 lb (73.9 kg)  04/16/24 172 lb 3.2 oz (78.1 kg)  03/27/24 173 lb 2 oz (78.5 kg)      Visit Diagnosis:    ICD-10-CM   1. MDD (major depressive disorder), recurrent episode, moderate (HCC)  F33.1     2. Anxiety state  F41.1     3. Insomnia, unspecified type  G47.00       Past Psychiatric History: Please see initial evaluation for full details. I have reviewed the history. No updates at this time.     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   Claustrophobia    Complication of surgery 02/20/2015   Overview:  Dilated pouch found on EGD done 12/2014  Depression    History of colostomy reversal 11/2022   History of kidney stones    Hypertension    Hypoglycemia    Hypothyroidism    Inflammation of sacroiliac joint 08/22/2014   Pneumonia 03/2020   PONV (postoperative nausea and vomiting)    NASUEATED/BAD HEADACHES   Seizures (HCC) 1985   x1, after labor/childbirth   Thyroid disease    Wears glasses     Past Surgical History:  Procedure Laterality Date   APPENDECTOMY  2010   BACK SURGERY  10/2005   lumbar   COLONOSCOPY WITH PROPOFOL  N/A 09/23/2016   Procedure: COLONOSCOPY WITH PROPOFOL ;  Surgeon: Rogelia Copping, MD;  Location: [facility];  Service: Endoscopy;  Laterality: N/A;   COLOSTOMY  10/22/2021   CYSTOSCOPY     x 1   EVALUATION UNDER ANESTHESIA WITH HEMORRHOIDECTOMY N/A 07/06/2020   Procedure: EXAM  UNDER ANESTHESIA WITH HEMORRHOIDECTOMY;  Surgeon: Marolyn Nest, MD;  Location: [facility];  Service: General;  Laterality: N/A;   GALLBLADDER SURGERY  2009   lap   GASTRIC BYPASS  2008   roux en y   MUCOSAL ADVANCEMENT FLAP N/A 12/10/2020   Procedure: MUCOSAL ADVANCEMENT FLAP;  Surgeon: Debby Hila, MD;  Location: [facility];  Service: General;  Laterality: N/A;  60 MIN   POLYPECTOMY  09/23/2016   Procedure: POLYPECTOMY;  Surgeon: Rogelia Copping, MD;  Location: [facility];  Service: Endoscopy;;   TOTAL VAGINAL HYSTERECTOMY  1999    Family Psychiatric History: Please see initial evaluation for full details. I have reviewed the history. No updates at this time.     Family History:  Family History  Problem Relation Age of Onset   Depression Mother    Hypertension Mother    Depression Father    Hypertension Father    Depression Sister    Breast cancer Sister 45   Hypertension Maternal Grandfather    Depression Maternal Grandmother    Hypertension Maternal Grandmother    Hypertension Paternal Grandfather    Hypertension Paternal Grandmother     Social History:  Social History   Socioeconomic History   Marital status: Married    Spouse name: Not on file   Number of children: Not on file   Years of education: Not on file   Highest education level: Associate degree: occupational, Scientist, product/process development, or vocational program  Occupational History   Not on file  Tobacco Use   Smoking status: Never   Smokeless tobacco: Never  Vaping Use   Vaping status: Never Used  Substance and Sexual Activity   Alcohol use: Yes    Alcohol/week: 0.0 standard drinks of alcohol    Comment: RARE   Drug use: No   Sexual activity: Not Currently  Other Topics Concern   Not on file  Social History Narrative   Not on file   Social Drivers of Health   Financial Resource Strain: Medium Risk (07/04/2023)   Overall Financial Resource Strain (CARDIA)    Difficulty of Paying Living  Expenses: Somewhat hard  Food Insecurity: No Food Insecurity (07/04/2023)   Hunger Vital Sign    Worried About Running Out of Food in the Last Year: Never true    Ran Out of Food in the Last Year: Never true  Transportation Needs: No Transportation Needs (07/04/2023)   PRAPARE - Administrator, Civil Service (Medical): No    Lack of Transportation (Non-Medical): No  Physical Activity: Unknown (07/04/2023)   Exercise Vital Sign  Days of Exercise per Week: 0 days    Minutes of Exercise per Session: Not on file  Stress: Stress Concern Present (07/04/2023)   [employer] of Occupational Health - Occupational Stress Questionnaire    Feeling of Stress : Very much  Social Connections: Moderately Integrated (07/04/2023)   Social Connection and Isolation Panel    Frequency of Communication with Friends and Family: Three times a week    Frequency of Social Gatherings with Friends and Family: Never    Attends Religious Services: Never    Database administrator or Organizations: Yes    Attends Banker Meetings: Never    Marital Status: Married    Allergies:  Allergies  Allergen Reactions   Losartan Palpitations    Could feel heartbeat (strongly) in neck   Penicillins Rash   Tape Rash    Skin irritation after back surgery white paper tape    Metabolic Disorder Labs: No results found for: HGBA1C, MPG No results found for: PROLACTIN Lab Results  Component Value Date   CHOL 183 12/21/2023   TRIG 47 12/21/2023   HDL 100 12/21/2023   CHOLHDL 1.8 12/21/2023   LDLCALC 73 12/21/2023   LDLCALC 94 02/01/2022   Lab Results  Component Value Date   TSH 3.300 12/21/2023   TSH 2.330 02/01/2022    Therapeutic Level Labs: No results found for: LITHIUM No results found for: VALPROATE No results found for: CBMZ  Current Medications: Current Outpatient Medications  Medication Sig Dispense Refill   Vilazodone  HCl 20 MG TABS Take 1 tablet (20 mg total)  by mouth daily. 30 tablet 0   alendronate (FOSAMAX) 70 MG tablet Take 70 mg by mouth once a week.     Blood Glucose Monitoring Suppl (ONETOUCH VERIO REFLECT) w/Device KIT 1 each by Does not apply route daily at 6 (six) AM. 1 kit 0   busPIRone  (BUSPAR ) 7.5 MG tablet Take 1 tablet (7.5 mg total) by mouth 2 (two) times daily. 180 tablet 1   calcitonin, salmon, (MIACALCIN/FORTICAL) 200 UNIT/ACT nasal spray SMARTSIG:Both Nares     cetirizine (ZYRTEC) 10 MG tablet Take 10 mg by mouth daily.     famotidine  (PEPCID ) 40 MG tablet Take by mouth.     fluticasone  (FLONASE ) 50 MCG/ACT nasal spray Place 2 sprays into both nostrils daily. 16 g 6   furosemide  (LASIX ) 20 MG tablet TAKE 1 TABLET DAILY (Patient taking differently: Take 20 mg by mouth daily as needed (fluid retention.).) 90 tablet 3   glucose blood test strip Use as instructed qid 100 each 12   ibuprofen  (ADVIL ) 800 MG tablet Take 1 tablet (800 mg total) by mouth every 8 (eight) hours as needed. 30 tablet 0   irbesartan  (AVAPRO ) 150 MG tablet Take 1 tablet (150 mg total) by mouth daily. 90 tablet 1   levothyroxine  (SYNTHROID ) 75 MCG tablet Take 1 tablet (75 mcg total) by mouth daily before breakfast. 90 tablet 1   methocarbamol  (ROBAXIN ) 500 MG tablet 1/2-1 po qHS prn     ondansetron  (ZOFRAN ) 8 MG tablet Take 8 mg by mouth every 8 (eight) hours as needed for nausea or vomiting.     OneTouch Delica Lancets 30G MISC 1 each by Does not apply route 4 (four) times daily as needed. 100 each 12   propranolol  (INDERAL ) 20 MG tablet Take 1 tablet (20 mg total) by mouth daily as needed. for anxiety 90 tablet 0   rOPINIRole  (REQUIP ) 4 MG tablet Take 1 tablet (4  mg total) by mouth at bedtime. 90 tablet 2   tamsulosin  (FLOMAX ) 0.4 MG CAPS capsule Take 1 capsule (0.4 mg total) by mouth daily as needed. (Patient taking differently: Take 0.4 mg by mouth daily as needed (Kidney stones).) 30 capsule 2   topiramate  (TOPAMAX ) 200 MG tablet Take 1 tablet (200 mg total)  by mouth at bedtime. 90 tablet 0   traMADol (ULTRAM) 50 MG tablet Take 25-50 mg by mouth 2 (two) times daily as needed (back pain.).      traZODone  (DESYREL ) 50 MG tablet Take 0.5-1 tablets (25-50 mg total) by mouth at bedtime. 30 tablet 1   valACYclovir (VALTREX) 1000 MG tablet as needed.     Vilazodone  HCl (VIIBRYD ) 10 MG TABS 5 mg daily for two weeks, then 10 mg daily 30 tablet 1   No current facility-administered medications for this visit.     Musculoskeletal: Strength & Muscle Tone: N/A Gait & Station: N/A Patient leans: N/A  Psychiatric Specialty Exam: Review of Systems  Psychiatric/Behavioral:  Positive for decreased concentration, dysphoric mood and sleep disturbance. Negative for agitation, behavioral problems, confusion, hallucinations, self-injury and suicidal ideas. The patient is nervous/anxious. The patient is not hyperactive.   All other systems reviewed and are negative.   There were no vitals taken for this visit.There is no height or weight on file to calculate BMI.  General Appearance: Well Groomed  Eye Contact:  Good  Speech:  Clear and Coherent  Volume:  Normal  Mood:  Anxious  Affect:  Appropriate and Congruent  Thought Process:  Coherent and Goal Directed  Orientation:  Full (Time, Place, and Person)  Thought Content: Logical   Suicidal Thoughts:  No  Homicidal Thoughts:  No  Memory:  Immediate;   Good  Judgement:  Good  Insight:  Good  Psychomotor Activity:  Normal  Concentration:  Concentration: Good and Attention Span: Good  Recall:  Good  Fund of Knowledge: Good  Language: Good  Akathisia:  No  Handed:  Right  AIMS (if indicated): not done  Assets:  Communication Skills Desire for Improvement  ADL's:  Intact  Cognition: WNL  Sleep:  Fair   Screenings: AUDIT    Flowsheet Row Appointment from 08/08/2023 in [facility] at [facility] Most recent reading at 07/04/2023  2:07 PM Appointment from 07/05/2023 in  [facility] at [facility] Most recent reading at 07/04/2023  2:07 PM  Alcohol Use Disorder Identification Test Final Score (AUDIT) 9  9    GAD-7    Flowsheet Row Office Visit from 07/09/2024 in [facility] at [facility] Office Visit from 03/27/2024 in [facility] Care & Sports Medicine at [facility] Office Visit from 12/21/2023 in [facility] Care & [facility] Medicine at [facility] Office Visit from 10/24/2023 in [facility] Office Visit from 06/29/2023 in [facility]  Total GAD-7 Score 21 21 21 21 21    PHQ2-9    Flowsheet Row Office Visit from 07/09/2024 in [facility] Care & [facility] Medicine at [facility] Office Visit from 03/27/2024 in [facility] Care [facility] Medicine at [facility] Office Visit from 12/21/2023 in [facility] Primary Care & Sports Medicine at [facility] Office Visit from 10/24/2023 in [facility] Office Visit from 06/29/2023 in [facility]  PHQ-2 Total Score 6  6 6 6 6   PHQ-9 Total Score 18 22 16 21 23    Flowsheet Row Office Visit from 06/28/2022 in [facility] Office Visit from 05/26/2022 in [facility] Office Visit from 05/05/2022 in [facility]  C-SSRS RISK CATEGORY No Risk No Risk No Risk     Assessment and Plan:  Toni Green is a 63 y.o. 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    1. MDD (major depressive disorder), recurrent episode, moderate (HCC) 2. Anxiety state She experiences ongoing conflict with her mother, who has a medical condition. She received five surgery after hernia repair surgery related to fistula, and unerwent colostomy reversal on February 23. She experienced loss of her sister in November 2022.  She experiences recent stressors, including ongoing communication difficulties with her mother with medical illness, and feelings of being criticized.   History: Tx from Dr. Vincente. Originally on Pristiq  100 mg daily, lamotrigine  150 mg daily, topiramate  200 mg daily, propranolol  20 mg TID. Declined TMS   Exam is notable for tearfulness.  She reports significant worsening in anxiety and depressive symptoms over the past several days.  Noted that she feels similar to how she has been feeling when she was tapering off Pristiq  in the past.  She is receptive to try uptitration of Viibryd  to optimize treatment for depression, and anxiety off-label.  Discussed potential risk of nausea.  Will continue current dose of BuSpar  for anxiety.  Will continue L-methylfolate as an adjunctive treatment for depression.  Will continue propranolol  as needed for anxiety.   3. Insomnia, unspecified type Slightly improving since taking trazodone .  She has adverse reaction of mild drowsiness from the medication.  She agrees to take the medication a little sooner at night to reduce this risk. It was previously discussed to reconnect with Ms. Terri for CBT-I.  Will follow-up at the next visit.   3. Binge eating disorder, unspecified severity Worsening in appetite since worsening in anxiety.  While topiramate  will be continued at the current dose to target binge eating, will consider tapering it off if any worsening.    4. Iron deficiency - ferritin 25 10/2023  She has been taking iron tablet with vitamin C.  Will consider recheck in several months.      Plan  Increase Vilazodone  20 mg at night  Continue Buspar  7.5 mg twice a day Continue propranolol  20 mg daily as needed for anxiety- she declined refill Continue topiramate  200 mg daily Continue L-methylfolate 15 mg daily Continue iron sulfate 325 mg daily Continue trazodone  25-50 mg at night as needed for insomnia - she was advised to take it early to reduce the risk of drowsiness Next appointment-  11/12 at 9 am, video The form is completed- [place]  [employer] insurance. It will be faxed from the office.  - prazosin  2 mg every other night for hypertension - on tramadol - EKG QTc 394 msec on 11/2020   Past trials of medication- sertraline , lexapro, venlafaxine, bupropion  (palpitation), Abilify, quetiapine  (eating sweets), Vraylar  (good effect, but caused increase in appetite), Rexulti  (good effect, but unable to afford) Geodon  (increase in appetite). Trazodone  (fatigue), Ambien , Lunesta  (severe drowsiness), lemborexant  (limited  benefit), ramelteon  (limited benefit)   The patient demonstrates the following risk factors for suicide: Chronic risk factors for suicide include: psychiatric disorder of depression . Acute risk factors for suicide include:  her family suffering from medical illness . Protective factors for this patient include: positive social support, coping skills, and hope for the future. Considering these factors, the overall suicide risk at this point appears to be low. Patient is appropriate for outpatient follow up.     Collaboration of Care: Collaboration of Care: Other reviewed notes in Epic  Patient/Guardian was advised Release of Information must be obtained prior to any record release in order to collaborate their care with an outside provider. Patient/Guardian was advised if they have not already done so to contact the registration department to sign all necessary forms in order for us  to release information regarding their care.   Consent: Patient/Guardian gives verbal  consent for treatment and assignment of benefits for services provided during this visit. Patient/Guardian expressed understanding and agreed to proceed.    [name], MD 08/05/2024, 3:40 PM

## 2024-08-12 DIAGNOSIS — M47816 Spondylosis without myelopathy or radiculopathy, lumbar region: Secondary | ICD-10-CM | POA: Diagnosis not present

## 2024-08-12 DIAGNOSIS — M5412 Radiculopathy, cervical region: Secondary | ICD-10-CM | POA: Diagnosis not present

## 2024-08-12 DIAGNOSIS — M4802 Spinal stenosis, cervical region: Secondary | ICD-10-CM | POA: Diagnosis not present

## 2024-08-12 DIAGNOSIS — M5416 Radiculopathy, lumbar region: Secondary | ICD-10-CM | POA: Diagnosis not present

## 2024-08-12 DIAGNOSIS — M48062 Spinal stenosis, lumbar region with neurogenic claudication: Secondary | ICD-10-CM | POA: Diagnosis not present

## 2024-08-12 DIAGNOSIS — M1612 Unilateral primary osteoarthritis, left hip: Secondary | ICD-10-CM | POA: Diagnosis not present

## 2024-08-12 DIAGNOSIS — M5126 Other intervertebral disc displacement, lumbar region: Secondary | ICD-10-CM | POA: Diagnosis not present

## 2024-08-14 ENCOUNTER — Other Ambulatory Visit: Payer: Self-pay | Admitting: Physical Medicine and Rehabilitation

## 2024-08-14 DIAGNOSIS — M4802 Spinal stenosis, cervical region: Secondary | ICD-10-CM

## 2024-08-23 ENCOUNTER — Other Ambulatory Visit

## 2024-08-23 NOTE — Progress Notes (Signed)
 ORTHOPAEDIC SURGERY- CLINIC NOTE  Chief Complaint: Right middle finger pain  History of Present Illness: History of Present Illness Toni Green is a 63 year old female who presents with worsening pain and dysfunction in her middle finger.  She reports significant pain and swelling in her middle finger, which she originally fractured at the age of [age] while roller skating. The fracture was never treated, and the finger has been crooked since then and has progressively gotten bigger and more painful over time. The pain is constant and severe, with a burning sensation on the side of the finger that has developed over the past few months. The pain radiates upwards and is associated with a loss of strength, making it difficult for her to perform daily activities such as holding a pot while cooking.  On some days, she is unable to make a fist due to pain and stiffness, although today she is experiencing relatively good finger mobility. She has been using tramadol for back pain, which provides some relief for her finger pain, and has also tried ibuprofen  and topical treatments like Voltaren gel, though she finds them largely ineffective. Recently, she started gabapentin  for her back pain, which has also helped her finger pain to some extent.  Her past medical history includes degenerative disc disease and arthritis in her back. She denies any heart or lung problems, diabetes, or other significant medical issues. She does not smoke and consumes alcohol socially.  Prior medical records were reviewed.    PMHx, PSurgHx, Fam Hx, Soc Hx, Meds, Allergies: Past Medical History:  Diagnosis Date  . Cyst of ovary 09/27/2022  . Depression   . Edema random   hctz as needed  . Hypertension   . Kidney stone   . Thyroid disease     Past Surgical History:  Procedure Laterality Date  . BARIATRIC SURGERY  Nov. 2008  . ESOPHAGOGASTRODOUDENOSCOPY W/REMOVAL FOREIGN BODY  01/13/2015   Procedure:  ESOPHAGOGASTRODUODENOSCOPY, FLEXIBLE, TRANSORAL; WITH REMOVAL OF FOREIGN BODY(S);  Surgeon: Lonell Cheryl Fletcher, MD;  Location: [facility];  Service: General Surgery;;  . APPENDECTOMY    . CHOLECYSTECTOMY    . GASTRIC BYPASS OPEN    . HYSTERECTOMY    . PR REMOVAL GALLBLADDER      Family History  Problem Relation Age of Onset  . High blood pressure (Hypertension) Sister 49  . Depression Sister   . High blood pressure (Hypertension) Mother   . Rheum arthritis Mother   . Depression Mother   . Osteoarthritis Mother   . Stroke Mother   . High blood pressure (Hypertension) Father   . Prostate cancer Father   . Rheum arthritis Paternal Grandmother     Social History   Socioeconomic History  . Marital status: Married    Spouse name: Lamar  . Number of children: 1  . Years of education: 13+  Occupational History  . Occupation: asstiant  Tobacco Use  . Smoking status: Never  . Smokeless tobacco: Never  . Tobacco comments:    Never smoked  Vaping Use  . Vaping status: Never Used  Substance and Sexual Activity  . Alcohol use: Not Currently    Alcohol/week: 0.0 standard drinks of alcohol    Comment: One/two month  . Drug use: No  . Sexual activity: Never    Partners: Male   Social Drivers of Health   Financial Resource Strain: Medium Risk (07/04/2023)   Received from [facility]   Overall Financial Resource Strain (CARDIA)   .  Difficulty of Paying Living Expenses: Somewhat hard  Food Insecurity: No Food Insecurity (07/04/2023)   Received from [facility]   Hunger Vital Sign   . Within the past 12 months, you worried that your food would run out before you got the money to buy more.: Never true   . Within the past 12 months, the food you bought just didn't last and you didn't have money to get more.: Never true  Transportation Needs: No Transportation Needs (07/04/2023)   Received from [facility] - Transportation   . Lack of Transportation (Medical): No    . Lack of Transportation (Non-Medical): No  Physical Activity: Unknown (07/04/2023)   Received from [facility]   Exercise Vital Sign   . On average, how many days per week do you engage in moderate to strenuous exercise (like a brisk walk)?: 0 days  Stress: Stress Concern Present (07/04/2023)   Received from [facility] - Occupational Stress Questionnaire   . Feeling of Stress : Very much  Social Connections: Moderately Integrated (07/04/2023)   Received from [facility]   Social Connection and Isolation Panel   . In a typical week, how many times do you talk on the phone with family, friends, or neighbors?: Three times a week   . How often do you get together with friends or relatives?: Never   . How often do you attend church or religious services?: Never   . Do you belong to any clubs or organizations such as church groups, unions, fraternal or athletic groups, or school groups?: Yes   . How often do you attend meetings of the clubs or organizations you belong to?: Never   . Are you married, widowed, divorced, separated, never married, or living with a partner?: Married  Housing Stability: Unknown (01/01/2024)   Housing Stability Vital Sign   . Homeless in the Last Year: No     Current Outpatient Medications  Medication Sig Dispense Refill  . alendronate (FOSAMAX) 70 MG tablet Take 1 tablet (70 mg total) by mouth every 7 (seven) days for 6 doses Take on an empty stomach with a full glass of water . Avoid mineral or well water . Do not eat or take other medications for at least 30 minutes after dose. Sit or stand for at least 30 minutes after dose. 6 tablet 0  . busPIRone  (BUSPAR ) 10 MG tablet Take 10 mg by mouth once daily    . busPIRone  (BUSPAR ) 7.5 MG tablet Take 7.5 mg by mouth 2 (two) times daily    . calcitonin, salmon, (MIACALCIN) 200 unit/actuation nasal spray USE 1 SPRAY(S) INTO THE LEFT NOSTRIL ONCE DAILY (Patient not taking: Reported on  08/12/2024) 4 mL 0  . cetirizine (ZYRTEC) 10 MG tablet Take 10 mg by mouth at bedtime as needed    . desvenlafaxine  succinate (PRISTIQ ) 100 MG ER tablet Take 100 mg by mouth once daily Has been titrating off (Patient not taking: Reported on 08/12/2024)    . FUROsemide  (LASIX ) 20 MG tablet Take 20 mg by mouth once daily as needed    . gabapentin  (NEURONTIN ) 100 MG capsule 1 po qHS 30 capsule 5  . irbesartan  (AVAPRO ) 150 MG tablet Take 150 mg by mouth once daily    . irbesartan  (AVAPRO ) 300 MG tablet Take 150 mg by mouth once daily    . levothyroxine  (SYNTHROID , LEVOTHROID) 75 MCG tablet Take by mouth.    . methocarbamoL  (ROBAXIN ) 500 MG  tablet 1/2-1 po qHS prn 30 tablet 5  . prazosin  (MINIPRESS ) 2 MG capsule Take 2 mg by mouth at bedtime (Patient not taking: Reported on 08/12/2024)    . predniSONE (DELTASONE) 5 MG tablet Take as directed - 6 day taper (Patient not taking: Reported on 08/12/2024) 21 tablet 0  . propranoloL  (INDERAL ) 20 MG tablet     . rOPINIRole  (REQUIP ) 0.25 MG tablet 0.25 mg at bedtime    . rOPINIRole  (REQUIP ) 4 MG immediate release tablet Take 4 mg by mouth at bedtime    . tamsulosin  (FLOMAX ) 0.4 mg capsule Take 0.4 mg by mouth    . topiramate  (TOPAMAX ) 100 MG tablet Take 100 mg by mouth 2 (two) times daily    . topiramate  (TOPAMAX ) 200 MG tablet Take 200 mg by mouth at bedtime    . traMADoL (ULTRAM) 50 mg tablet TAKE 1/2 TO 1 (ONE-HALF TO ONE) TABLET BY MOUTH TWICE DAILY AS NEEDED 60 tablet 1  . traZODone  (DESYREL ) 100 MG tablet     . traZODone  (DESYREL ) 50 MG tablet Take 25-50 mg by mouth    . valACYclovir (VALTREX) 1000 MG tablet Take 2,000 mg by mouth 2 (two) times daily    . vilazodone  (VIIBRYD ) 10 mg tablet Take 10 mg by mouth once daily    . vilazodone  (VIIBRYD ) 20 mg tablet Take 20 mg by mouth once daily     No current facility-administered medications for this visit.    Allergies  Allergen Reactions  . Losartan Other (See Comments)  . Oxycodone -Acetaminophen   Itching and Swelling    Other reaction(s): ITCHING    . Penicillins Unknown  . Adhesive Tape-Silicones Rash    Skin irritation after back surgery      Review of Systems: A 10+ ROS was performed, reviewed, and the pertinent orthopaedic findings are documented in the HPI.  I have reviewed and agree with the ROS captured by the CMA.    Physical Exam: BP 122/80   Ht 177.8 cm (5' 10)   Wt 72 kg (158 lb 12.8 oz)   BMI 22.79 kg/m  General/Constitutional: No apparent distress: well-nourished and well developed. Eyes: Pupils equal, round with synchronous movement. Lymphatic: No palpable adenopathy. Respiratory: Patient has good chest rise and fall with inspiration and expiration.   Cardiovascular: Warm and well-perfused Integumentary: No impressive skin lesions present, except as noted in detailed exam. Neuro/Psych: Normal mood and affect, oriented to person, place and time. Musculoskeletal: see exam below  Right Upper Extremity: Prominent knuckle of the right middle finger PIP joint with slight ulnar deviation.  Tender to palpation.  Pain with passive range of motion.  Patient has full extension of the digit and about 90 degrees of active flexion.  She can almost make a full composite fist but lacks terminal flexion of the middle finger.  Sensation is intact to light touch to the digit.  Warm and well-perfused. Imaging and Results: Radiographs of the right hand demonstrate fairly significant arthritic changes to the middle finger PIP joint.  There is an ulnar deviation to the joint with large osteophyte formation, joint space narrowing, cystic changes.  There is also some mild arthritic changes to the middle finger DIP joint.  Otherwise no significant erosive or degenerative changes.  No fractures or dislocations.  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 X-ray shows significant arthritis and deviation, likely from an old injury. Conservative  management insufficient thus far. Surgical options include joint fusion or replacement. Steroid injection considered to assess pain relief. - Administered steroid injection into the right middle finger proximal interphalangeal joint. - Scheduled follow-up in a couple of months to assess response to injection and discuss further management.  Procedure Note - Right middle finger PIP Joint Injection After discussing the various treatment options for the condition, It was agreed that a steroid injection would be the next step in treatment. The nature of and the indications for a corticosteroid and / or local anaesthetic injection were reviewed in detail with the patient today. The inherent risks of injection including infection, allergic reaction, increased pain, incomplete relief or temporary relief of symptoms, alterations of blood glucose levels requiring careful monitoring and treatment as indicated, tendon, ligament or articular cartilage rupture or degeneration, nerve injury, skin depigmentation, and/or fat atrophy were discussed.  After the risks and benefits of the procedure were explained, consent was given, and time-out was performed. The dorsal aspect of the of the right middle finger PIPJ was prepped with alcohol solution. The site was anesthetized with ethyl chloride. The joint was injected with 0.5 mL of Betamethasone 6 mg/ mL and 0.5 mL of 1% Lidocaine . The procedure was well tolerated.    Jackquline CANDIE Barrack, MD [facility] [address] [place], [place] [address] Phone: [phone]  This note was generated in part with voice recognition software; please excuse any typographical errors that were not detected and corrected. This note has been created using automated tools and reviewed for accuracy by [facility].

## 2024-08-25 NOTE — Progress Notes (Deleted)
 BH MD/PA/NP OP Progress Note  08/25/2024 11:14 AM Toni Green  MRN:  [id number]  Chief Complaint: No chief complaint on file.  HPI: ***  Ilazodone increased  Visit Diagnosis: No diagnosis found.  Past Psychiatric History: Please see initial evaluation for full details. I have reviewed the history. No updates at this time.     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   Claustrophobia    Complication of surgery 02/20/2015   Overview:  Dilated pouch found on EGD done 12/2014    Depression    History of colostomy reversal 11/2022   History of kidney stones    Hypertension    Hypoglycemia    Hypothyroidism    Inflammation of sacroiliac joint 08/22/2014   Pneumonia 03/2020   PONV (postoperative nausea and vomiting)    NASUEATED/BAD HEADACHES   Seizures (HCC) 1985   x1, after labor/childbirth   Thyroid disease    Wears glasses     Past Surgical History:  Procedure Laterality Date   APPENDECTOMY  2010   BACK SURGERY  10/2005   lumbar   COLONOSCOPY WITH PROPOFOL  N/A 09/23/2016   Procedure: COLONOSCOPY WITH PROPOFOL ;  Surgeon: Rogelia Copping, MD;  Location: [facility];  Service: Endoscopy;  Laterality: N/A;   COLOSTOMY  10/22/2021   CYSTOSCOPY     x 1   EVALUATION UNDER ANESTHESIA WITH HEMORRHOIDECTOMY N/A 07/06/2020   Procedure: EXAM UNDER ANESTHESIA WITH HEMORRHOIDECTOMY;  Surgeon: Marolyn Nest, MD;  Location: [facility];  Service: General;  Laterality: N/A;   GALLBLADDER SURGERY  2009   lap   GASTRIC BYPASS  2008   roux en y   MUCOSAL ADVANCEMENT FLAP N/A 12/10/2020   Procedure: MUCOSAL ADVANCEMENT FLAP;  Surgeon: Debby Hila, MD;  Location: [facility];  Service: General;  Laterality: N/A;  60 MIN   POLYPECTOMY   09/23/2016   Procedure: POLYPECTOMY;  Surgeon: Rogelia Copping, MD;  Location: [facility];  Service: Endoscopy;;   TOTAL VAGINAL HYSTERECTOMY  1999    Family Psychiatric History: Please see initial evaluation for full details. I have reviewed the history. No updates at this time.     Family History:  Family History  Problem Relation Age of Onset   Depression Mother    Hypertension Mother    Depression Father    Hypertension Father    Depression Sister    Breast cancer Sister 73   Hypertension Maternal Grandfather    Depression Maternal Grandmother    Hypertension Maternal Grandmother    Hypertension Paternal Grandfather    Hypertension Paternal Grandmother     Social History:  Social History   Socioeconomic History   Marital status: Married    Spouse name: Not on file   Number of children: Not on file   Years of education: Not on file   Highest education level: Associate degree: occupational, scientist, product/process development, or vocational program  Occupational History   Not on file  Tobacco Use   Smoking status: Never   Smokeless tobacco: Never  Vaping Use   Vaping status: Never Used  Substance and Sexual Activity   Alcohol use: Yes    Alcohol/week: 0.0 standard drinks of alcohol  Comment: RARE   Drug use: No   Sexual activity: Not Currently  Other Topics Concern   Not on file  Social History Narrative   Not on file   Social Drivers of Health   Financial Resource Strain: Low Risk  (08/23/2024)   Received from [facility]   Overall Financial Resource Strain (CARDIA)    Difficulty of Paying Living Expenses: Not hard at all  Food Insecurity: No Food Insecurity (08/23/2024)   [facility] from [facility]   Hunger Vital Sign    Within the past 12 months, you worried that your food would run out before you got the money to buy more.: Never true    Within the past 12 months, the food you bought just didn't last and you didn't have money to get  more.: Never true  Transportation Needs: No Transportation Needs (08/23/2024)   Received from [facility] - Transportation    In the past 12 months, has lack of transportation kept you from medical appointments or from getting medications?: No    Lack of Transportation (Non-Medical): No  Physical Activity: Unknown (07/04/2023)   Exercise Vital Sign    Days of Exercise per Week: 0 days    Minutes of Exercise per Session: Not on file  Stress: Stress Concern Present (07/04/2023)   [employer] - Occupational Stress Questionnaire    Feeling of Stress : Very much  Social Connections: Moderately Integrated (07/04/2023)   Social Connection and Isolation Panel    Frequency of Communication with Friends and Family: Three times a week    Frequency of Social Gatherings with Friends and Family: Never    Attends Religious Services: Never    Database Administrator or Organizations: Yes    Attends Banker Meetings: Never    Marital Status: Married    Allergies:  Allergies  Allergen Reactions   Losartan Palpitations    Could feel heartbeat (strongly) in neck   Penicillins Rash   Tape Rash    Skin irritation after back surgery white paper tape    Metabolic Disorder Labs: No results found for: HGBA1C, MPG No results found for: PROLACTIN Lab Results  Component Value Date   CHOL 183 12/21/2023   TRIG 47 12/21/2023   HDL 100 12/21/2023   CHOLHDL 1.8 12/21/2023   LDLCALC 73 12/21/2023   LDLCALC 94 02/01/2022   Lab Results  Component Value Date   TSH 3.300 12/21/2023   TSH 2.330 02/01/2022    Therapeutic Level Labs: No results found for: LITHIUM No results found for: VALPROATE No results found for: CBMZ  Current Medications: Current Outpatient Medications  Medication Sig Dispense Refill   alendronate (FOSAMAX) 70 MG tablet Take 70 mg by mouth once a week.     Blood Glucose Monitoring Suppl (ONETOUCH  VERIO REFLECT) w/Device KIT 1 each by Does not apply route daily at 6 (six) AM. 1 kit 0   busPIRone  (BUSPAR ) 7.5 MG tablet Take 1 tablet (7.5 mg total) by mouth 2 (two) times daily. 180 tablet 1   calcitonin, salmon, (MIACALCIN/FORTICAL) 200 UNIT/ACT nasal spray SMARTSIG:Both Nares     cetirizine (ZYRTEC) 10 MG tablet Take 10 mg by mouth daily.     famotidine  (PEPCID ) 40 MG tablet Take by mouth.     fluticasone  (FLONASE ) 50 MCG/ACT nasal spray Place 2 sprays into both nostrils daily. 16 g 6   furosemide  (LASIX ) 20 MG tablet TAKE 1  TABLET DAILY (Patient taking differently: Take 20 mg by mouth daily as needed (fluid retention.).) 90 tablet 3   glucose blood test strip Use as instructed qid 100 each 12   ibuprofen  (ADVIL ) 800 MG tablet Take 1 tablet (800 mg total) by mouth every 8 (eight) hours as needed. 30 tablet 0   irbesartan  (AVAPRO ) 150 MG tablet Take 1 tablet (150 mg total) by mouth daily. 90 tablet 1   levothyroxine  (SYNTHROID ) 75 MCG tablet Take 1 tablet (75 mcg total) by mouth daily before breakfast. 90 tablet 1   methocarbamol  (ROBAXIN ) 500 MG tablet 1/2-1 po qHS prn     ondansetron  (ZOFRAN ) 8 MG tablet Take 8 mg by mouth every 8 (eight) hours as needed for nausea or vomiting.     OneTouch Delica Lancets 30G MISC 1 each by Does not apply route 4 (four) times daily as needed. 100 each 12   propranolol  (INDERAL ) 20 MG tablet Take 1 tablet (20 mg total) by mouth daily as needed. for anxiety 90 tablet 0   rOPINIRole  (REQUIP ) 4 MG tablet Take 1 tablet (4 mg total) by mouth at bedtime. 90 tablet 2   tamsulosin  (FLOMAX ) 0.4 MG CAPS capsule Take 1 capsule (0.4 mg total) by mouth daily as needed. (Patient taking differently: Take 0.4 mg by mouth daily as needed (Kidney stones).) 30 capsule 2   topiramate  (TOPAMAX ) 200 MG tablet Take 1 tablet (200 mg total) by mouth at bedtime. 90 tablet 0   traMADol (ULTRAM) 50 MG tablet Take 25-50 mg by mouth 2 (two) times daily as needed (back pain.).       traZODone  (DESYREL ) 50 MG tablet Take 0.5-1 tablets (25-50 mg total) by mouth at bedtime. 30 tablet 1   valACYclovir (VALTREX) 1000 MG tablet as needed.     Vilazodone  HCl (VIIBRYD ) 10 MG TABS 5 mg daily for two weeks, then 10 mg daily 30 tablet 1   Vilazodone  HCl 20 MG TABS Take 1 tablet (20 mg total) by mouth daily. 30 tablet 0   No current facility-administered medications for this visit.     Musculoskeletal: Strength & Muscle Tone: N/A Gait & Station: N/A Patient leans: N/A  Psychiatric Specialty Exam: Review of Systems  There were no vitals taken for this visit.There is no height or weight on file to calculate BMI.  General Appearance: {Appearance:22683}  Eye Contact:  {BHH EYE CONTACT:22684}  Speech:  Clear and Coherent  Volume:  Normal  Mood:  {BHH MOOD:22306}  Affect:  {Affect (PAA):22687}  Thought Process:  Coherent  Orientation:  Full (Time, Place, and Person)  Thought Content: Logical   Suicidal Thoughts:  {ST/HT (PAA):22692}  Homicidal Thoughts:  {ST/HT (PAA):22692}  Memory:  Immediate;   Good  Judgement:  {Judgement (PAA):22694}  Insight:  {Insight (PAA):22695}  Psychomotor Activity:  Normal  Concentration:  Concentration: Good and Attention Span: Good  Recall:  Good  Fund of Knowledge: Good  Language: Good  Akathisia:  No  Handed:  Right  AIMS (if indicated): not done  Assets:  Communication Skills Desire for Improvement  ADL's:  Intact  Cognition: WNL  Sleep:  {BHH GOOD/FAIR/POOR:22877}   Screenings: AUDIT    Flowsheet Row Appointment from 08/08/2023 in [facility] at [facility] Most recent reading at 07/04/2023  2:07 PM Appointment from 07/05/2023 in [facility] Primary Care & [facility] Medicine at [facility] Most recent reading at 07/04/2023  2:07 PM  Alcohol Use Disorder Identification Test Final Score (AUDIT)  9  9    GAD-7    Flowsheet Row Office Visit from 07/09/2024 in [facility] Primary Care & [facility]  Medicine at [facility] Office Visit from 03/27/2024 in [facility] at [facility] Office Visit from 12/21/2023 in [facility] at [facility] Office Visit from 10/24/2023 in [facility] Office Visit from 06/29/2023 in [facility]  Total GAD-7 Score 21 21 21 21 21    PHQ2-9    Flowsheet Row Office Visit from 07/09/2024 in [facility] Medicine at [facility] Office Visit from 03/27/2024 in [facility] Primary Care & Sports Medicine at [facility] Office Visit from 12/21/2023 in [facility] Primary Care & Sports Medicine at [facility] Office Visit from 10/24/2023 in [facility] Office Visit from 06/29/2023 in [facility]  PHQ-2 Total Score 6 6 6 6 6   PHQ-9 Total Score 18 22 16 21 23    Flowsheet Row Office Visit from 06/28/2022 in [facility] Office Visit from 05/26/2022 in [facility] Office Visit from 05/05/2022 in [facility]  C-SSRS RISK CATEGORY No Risk No Risk No Risk     Assessment and Plan:  Toni Green is a 63 y.o. 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    1. MDD (major depressive disorder), recurrent episode, moderate (HCC) 2. Anxiety state She experiences ongoing conflict with her mother, who has a medical condition. She received five surgery after hernia repair surgery related to  fistula, and unerwent colostomy reversal on February 23. She experienced loss of her sister in November 2022.  She experiences recent stressors, including ongoing communication difficulties with her mother with medical illness, and feelings of being criticized.   History: Tx from Dr. Vincente. Originally on Pristiq  100 mg daily, lamotrigine  150 mg daily, topiramate  200 mg daily, propranolol  20 mg TID. Declined TMS   Exam is notable for tearfulness.  She reports significant worsening in anxiety and depressive symptoms over the past several days.  Noted that she feels similar to how she has been feeling when she was tapering off Pristiq  in the past.  She is receptive to try uptitration of Viibryd  to optimize treatment for depression, and anxiety off-label.  Discussed potential risk of nausea.  Will continue current dose of BuSpar  for anxiety.  Will continue L-methylfolate as an adjunctive treatment for depression.  Will continue propranolol  as needed for anxiety.    3. Insomnia, unspecified type Slightly improving since taking trazodone .  She has adverse reaction of mild drowsiness from the medication.  She agrees to take the medication a little sooner at night to reduce this risk. It was previously discussed to reconnect with Ms. Terri for CBT-I.  Will follow-up at the next visit.    3. Binge eating disorder, unspecified severity Worsening in appetite since worsening in anxiety.  While topiramate  will be continued at the current dose to target binge eating, will consider tapering it off if any worsening.    4. Iron deficiency - ferritin 25 10/2023  She has been  taking iron tablet with vitamin C.  Will consider recheck in several months.     Plan  Increase Vilazodone  20 mg at night  Continue Buspar  7.5 mg twice a day Continue propranolol  20 mg daily as needed for anxiety- she declined refill Continue topiramate  200 mg daily Continue L-methylfolate 15 mg daily Continue iron sulfate 325 mg  daily Continue trazodone  25-50 mg at night as needed for insomnia - she was advised to take it early to reduce the risk of drowsiness Next appointment-  11/12 at 9 am, video The form is completed- [place]  [employer]. It will be faxed from the office.  - prazosin  2 mg every other night for hypertension - on tramadol - EKG QTc 394 msec on 11/2020   Past trials of medication- sertraline , lexapro, venlafaxine, bupropion  (palpitation), Abilify, quetiapine  (eating sweets), Vraylar  (good effect, but caused increase in appetite), Rexulti  (good effect, but unable to afford) Geodon  (increase in appetite). Trazodone  (fatigue), Ambien , Lunesta  (severe drowsiness), lemborexant  (limited benefit), ramelteon  (limited benefit)   The patient demonstrates the following risk factors for suicide: Chronic risk factors for suicide include: psychiatric disorder of depression . Acute risk factors for suicide include:  her family suffering from medical illness . Protective factors for this patient include: positive social support, coping skills, and hope for the future. Considering these factors, the overall suicide risk at this point appears to be low. Patient is appropriate for outpatient follow up.     Collaboration of Care: Collaboration of Care: {BH OP Collaboration of Care:21014065}  Patient/Guardian was advised Release of Information must be obtained prior to any record release in order to collaborate their care with an outside provider. Patient/Guardian was advised if they have not already done so to contact the registration department to sign all necessary forms in order for us  to release information regarding their care.   Consent: Patient/Guardian gives verbal consent for treatment and assignment of benefits for services provided during this visit. Patient/Guardian expressed understanding and agreed to proceed.    [name], MD 08/25/2024, 11:14 AM

## 2024-08-26 ENCOUNTER — Ambulatory Visit
Admission: RE | Admit: 2024-08-26 | Discharge: 2024-08-26 | Disposition: A | Discharge status: Home / Self Care | Source: Ambulatory Visit | Attending: Physical Medicine and Rehabilitation | Admitting: Physical Medicine and Rehabilitation

## 2024-08-26 DIAGNOSIS — M4802 Spinal stenosis, cervical region: Secondary | ICD-10-CM

## 2024-08-26 DIAGNOSIS — M50221 Other cervical disc displacement at C4-C5 level: Secondary | ICD-10-CM | POA: Diagnosis not present

## 2024-08-28 ENCOUNTER — Telehealth: Admitting: Psychiatry

## 2024-08-31 ENCOUNTER — Other Ambulatory Visit: Payer: Self-pay | Admitting: Psychiatry

## 2024-09-07 NOTE — Progress Notes (Unsigned)
 Virtual Visit via Video Note  I connected with FRANCHON KETTERMAN on 09/09/24 at  4:30 PM EST by a video enabled telemedicine application and verified that I am speaking with the correct person using two identifiers.  Location: Patient: home Provider: office Persons participated in the visit- patient, provider    I discussed the limitations of evaluation and management by telemedicine and the availability of in person appointments. The patient expressed understanding and agreed to proceed.  I discussed the assessment and treatment plan with the patient. The patient was provided an opportunity to ask questions and all were answered. The patient agreed with the plan and demonstrated an understanding of the instructions.   The patient was advised to call back or seek an in-person evaluation if the symptoms worsen or if the condition fails to improve as anticipated.  [name], MD    [facility] MD/PA/NP OP Progress Note  09/09/2024 5:55 PM RALYN STLAURENT  MRN:  [id number]  Chief Complaint:  Chief Complaint  Patient presents with   Follow-up   HPI:  This is a follow-up appointment for depression, anxiety, insomnia.  This appointment is made urgently due to the patient concern.  She states that she is not doing well.  She experiences nausea and a headache.  She has lost some body weight.  She also feels radiates.  Anything can make her feel angry and she goes into rage especially against her husband.  She packed herself and left the house despite them being married for over 30 years.  She later returned as she did not have any place to go.  This is not like her.  She feels very bad and she does not want to feel this way.  She feels tired and her body is tired.  Something needs to be happening.  She feels tired of crying.  She reports SI, although she adamantly denies any plan or intent.  She expressed understanding of contacting emergency resources if any worsening.  She appreciates the care  provided from this office.  She would like to be back on Pristiq .  She looked up esketamine treatment and is interested in this treatment option.  When TMS is also advised to consider, she reports ambivalence as she feels it is like science fiction.  She agrees to consider these options after getting back on Pristiq .  She denies any alcohol use since October.  She denies substance use.  She agrees with the plans as outlined below.    148 lbs Wt Readings from Last 3 Encounters:  07/09/24 163 lb (73.9 kg)  04/16/24 172 lb 3.2 oz (78.1 kg)  03/27/24 173 lb 2 oz (78.5 kg)     Visit Diagnosis:    ICD-10-CM   1. MDD (major depressive disorder), recurrent episode, moderate (HCC)  F33.1     2. Anxiety state  F41.1     3. Insomnia, unspecified type  G47.00       Past Psychiatric History: Please see initial evaluation for full details. I have reviewed the history. No updates at this time.     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   Claustrophobia    Complication of surgery 02/20/2015  Overview:  Dilated pouch found on EGD done 12/2014    Depression    History of colostomy reversal 11/2022   History of kidney stones    Hypertension    Hypoglycemia    Hypothyroidism    Inflammation of sacroiliac joint 08/22/2014   Pneumonia 03/2020   PONV (postoperative nausea and vomiting)    NASUEATED/BAD HEADACHES   Seizures (HCC) 1985   x1, after labor/childbirth   Thyroid disease    Wears glasses     Past Surgical History:  Procedure Laterality Date   APPENDECTOMY  2010   BACK SURGERY  10/2005   lumbar   COLONOSCOPY WITH PROPOFOL  N/A 09/23/2016   Procedure: COLONOSCOPY WITH PROPOFOL ;  Surgeon: Rogelia Copping, MD;  Location: [facility];  Service: Endoscopy;   Laterality: N/A;   COLOSTOMY  10/22/2021   CYSTOSCOPY     x 1   EVALUATION UNDER ANESTHESIA WITH HEMORRHOIDECTOMY N/A 07/06/2020   Procedure: EXAM UNDER ANESTHESIA WITH HEMORRHOIDECTOMY;  Surgeon: Marolyn Nest, MD;  Location: [facility];  Service: General;  Laterality: N/A;   GALLBLADDER SURGERY  2009   lap   GASTRIC BYPASS  2008   roux en y   MUCOSAL ADVANCEMENT FLAP N/A 12/10/2020   Procedure: MUCOSAL ADVANCEMENT FLAP;  Surgeon: Debby Hila, MD;  Location: [facility];  Service: General;  Laterality: N/A;  60 MIN   POLYPECTOMY  09/23/2016   Procedure: POLYPECTOMY;  Surgeon: Rogelia Copping, MD;  Location: [facility];  Service: Endoscopy;;   TOTAL VAGINAL HYSTERECTOMY  1999    Family Psychiatric History: Please see initial evaluation for full details. I have reviewed the history. No updates at this time.     Family History:  Family History  Problem Relation Age of Onset   Depression Mother    Hypertension Mother    Depression Father    Hypertension Father    Depression Sister    Breast cancer Sister 3   Hypertension Maternal Grandfather    Depression Maternal Grandmother    Hypertension Maternal Grandmother    Hypertension Paternal Grandfather    Hypertension Paternal Grandmother     Social History:  Social History   Socioeconomic History   Marital status: Married    Spouse name: Not on file   Number of children: Not on file   Years of education: Not on file   Highest education level: Associate degree: occupational, scientist, product/process development, or vocational program  Occupational History   Not on file  Tobacco Use   Smoking status: Never   Smokeless tobacco: Never  Vaping Use   Vaping status: Never Used  Substance and Sexual Activity   Alcohol use: Yes    Alcohol/week: 0.0 standard drinks of alcohol    Comment: RARE   Drug use: No   Sexual activity: Not Currently  Other Topics Concern   Not on file  Social History Narrative   Not on file    Social Drivers of Health   Financial Resource Strain: Low Risk  (08/23/2024)   Received [facility]   Overall Financial Resource Strain (CARDIA)    Difficulty of Paying Living Expenses: Not hard at all  Food Insecurity: No Food Insecurity (08/23/2024)   Received from [facility]   Hunger Vital Sign    Within the past 12 months, you worried that your food would run out before you got the money to buy more.: Never true    Within the past 12 months, the food you  bought just didn't last and you didn't have money to get more.: Never true  Transportation Needs: No Transportation Needs (08/23/2024)   Received [facility]    In the past 12 months, has lack of transportation kept you from medical appointments or from getting medications?: No    Lack of Transportation (Non-Medical): No  Physical Activity: Unknown (07/04/2023)   Exercise Vital Sign    Days of Exercise per Week: 0 days    Minutes of Exercise per Session: Not on file  Stress: Stress Concern Present (07/04/2023)   [employer] - Occupational Stress Questionnaire    Feeling of Stress : Very much  Social Connections: Moderately Integrated (07/04/2023)   Social Connection and Isolation Panel    Frequency of Communication with Friends and Family: Three times a week    Frequency of Social Gatherings with Friends and Family: Never    Attends Religious Services: Never    Database Administrator or Organizations: Yes    Attends Banker Meetings: Never    Marital Status: Married    Allergies:  Allergies  Allergen Reactions   Losartan Palpitations    Could feel heartbeat (strongly) in neck   Penicillins Rash   Tape Rash    Skin irritation after back surgery white paper tape    Metabolic Disorder Labs: No results found for: HGBA1C, MPG No results found for: PROLACTIN Lab Results  Component Value  Date   CHOL 183 12/21/2023   TRIG 47 12/21/2023   HDL 100 12/21/2023   CHOLHDL 1.8 12/21/2023   LDLCALC 73 12/21/2023   LDLCALC 94 02/01/2022   Lab Results  Component Value Date   TSH 3.300 12/21/2023   TSH 2.330 02/01/2022    Therapeutic Level Labs: No results found for: LITHIUM No results found for: VALPROATE No results found for: CBMZ  Current Medications: Current Outpatient Medications  Medication Sig Dispense Refill   [START ON 09/22/2024] desvenlafaxine  (PRISTIQ ) 100 MG 24 hr tablet Take 1 tablet (100 mg total) by mouth daily. Start after completing 50 mg daily for 7 days 30 tablet 0   desvenlafaxine  (PRISTIQ ) 25 MG 24 hr tablet Take 1 tablet (25 mg total) by mouth daily for 7 days, THEN 2 tablets (50 mg total) daily for 7 days. 21 tablet 0   alendronate (FOSAMAX) 70 MG tablet Take 70 mg by mouth once a week.     Blood Glucose Monitoring Suppl (ONETOUCH VERIO REFLECT) w/Device KIT 1 each by Does not apply route daily at 6 (six) AM. 1 kit 0   busPIRone  (BUSPAR ) 7.5 MG tablet Take 1 tablet (7.5 mg total) by mouth 2 (two) times daily. 180 tablet 1   calcitonin, salmon, (MIACALCIN/FORTICAL) 200 UNIT/ACT nasal spray SMARTSIG:Both Nares     cetirizine (ZYRTEC) 10 MG tablet Take 10 mg by mouth daily.     famotidine  (PEPCID ) 40 MG tablet Take by mouth.     fluticasone  (FLONASE ) 50 MCG/ACT nasal spray Place 2 sprays into both nostrils daily. 16 g 6   furosemide  (LASIX ) 20 MG tablet TAKE 1 TABLET DAILY (Patient taking differently: Take 20 mg by mouth daily as needed (fluid retention.).) 90 tablet 3   glucose blood test strip Use as instructed qid 100 each 12   ibuprofen  (ADVIL ) 800 MG tablet Take 1 tablet (800 mg total) by mouth every 8 (eight) hours as needed. 30 tablet 0   irbesartan  (AVAPRO ) 150 MG tablet Take 1  tablet (150 mg total) by mouth daily. 90 tablet 1   levothyroxine  (SYNTHROID ) 75 MCG tablet Take 1 tablet (75 mcg total) by mouth daily before breakfast. 90 tablet 1    methocarbamol  (ROBAXIN ) 500 MG tablet 1/2-1 po qHS prn     ondansetron  (ZOFRAN ) 8 MG tablet Take 8 mg by mouth every 8 (eight) hours as needed for nausea or vomiting.     OneTouch Delica Lancets 30G MISC 1 each by Does not apply route 4 (four) times daily as needed. 100 each 12   propranolol  (INDERAL ) 20 MG tablet Take 1 tablet (20 mg total) by mouth daily as needed. for anxiety 90 tablet 0   rOPINIRole  (REQUIP ) 4 MG tablet Take 1 tablet (4 mg total) by mouth at bedtime. 90 tablet 2   tamsulosin  (FLOMAX ) 0.4 MG CAPS capsule Take 1 capsule (0.4 mg total) by mouth daily as needed. (Patient taking differently: Take 0.4 mg by mouth daily as needed (Kidney stones).) 30 capsule 2   topiramate  (TOPAMAX ) 200 MG tablet Take 1 tablet (200 mg total) by mouth at bedtime. 90 tablet 0   traMADol (ULTRAM) 50 MG tablet Take 25-50 mg by mouth 2 (two) times daily as needed (back pain.).      traZODone  (DESYREL ) 50 MG tablet Take 0.5-1 tablets (25-50 mg total) by mouth at bedtime. 30 tablet 1   valACYclovir (VALTREX) 1000 MG tablet as needed.     No current facility-administered medications for this visit.     Musculoskeletal: Strength & Muscle Tone: N/A Gait & Station: N/A Patient leans: N/A  Psychiatric Specialty Exam: Review of Systems  Psychiatric/Behavioral:  Positive for dysphoric mood, sleep disturbance and suicidal ideas. Negative for agitation, behavioral problems, confusion, decreased concentration, hallucinations and self-injury. The patient is nervous/anxious. The patient is not hyperactive.   All other systems reviewed and are negative.   There were no vitals taken for this visit.There is no height or weight on file to calculate BMI.  General Appearance: Well Groomed  Eye Contact:  Good  Speech:  Clear and Coherent  Volume:  Normal  Mood:  Irritable  Affect:  Appropriate, Congruent, and Tearful  Thought Process:  Coherent  Orientation:  Full (Time, Place, and Person)  Thought Content:  Logical   Suicidal Thoughts:  Yes.  without intent/plan  Homicidal Thoughts:  No  Memory:  Immediate;   Good  Judgement:  Good  Insight:  Good  Psychomotor Activity:  Normal  Concentration:  Concentration: Good and Attention Span: Good  Recall:  Good  Fund of Knowledge: Good  Language: Good  Akathisia:  No  Handed:  Right  AIMS (if indicated): not done  Assets:  Communication Skills Desire for Improvement  ADL's:  Intact  Cognition: WNL  Sleep:  Poor   Screenings: AUDIT    Flowsheet Row Appointment from 08/08/2023 in [facility] at [facility] Most recent reading at 07/04/2023  2:07 PM Appointment from 07/05/2023 in [facility] at [facility] Most recent reading at 07/04/2023  2:07 PM  Alcohol Use Disorder Identification Test Final Score (AUDIT) 9  9    GAD-7    Flowsheet Row Office Visit from 07/09/2024 in [facility] at [facility] Office Visit from 03/27/2024 in [facility] & [facility] Medicine at [facility] Office Visit from 12/21/2023 in [facility] at [facility] Office Visit from 10/24/2023 in [facility] Office Visit from 06/29/2023 in [facility]  Total GAD-7 Score 21 21 21 21 21    PHQ2-9    Flowsheet Row Office Visit from 07/09/2024 in [facility] & [facility] Medicine at [facility] Office Visit from 03/27/2024 in [facility] Primary Care & Sports Medicine at [facility] Office Visit from 12/21/2023 in [facility] & [facility] Medicine at [facility] Office Visit from 10/24/2023 in [facility] Office Visit from 06/29/2023 in [facility]  PHQ-2 Total Score 6 6 6 6 6   PHQ-9 Total Score 18 22 16 21 23    Flowsheet Row Office Visit from  06/28/2022 in [facility] Office Visit from 05/26/2022 in [facility] Office Visit from 05/05/2022 in [facility]  C-SSRS RISK CATEGORY No Risk No Risk No Risk     Assessment and Plan:  BRYNNLIE UNTERREINER is a 63 y.o. 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   1. MDD (major depressive disorder), recurrent episode, moderate (HCC) 2. Anxiety state She experiences ongoing conflict with her mother, who has a medical condition. She received five surgery after hernia repair surgery related to fistula, and underwent colostomy reversal on February 23. She experienced loss of her sister in November 2022.  She experiences recent stressors, including ongoing communication difficulties with her mother with medical illness, and feelings of being criticized.   History: Tx from Dr. Vincente. Originally on Pristiq  100 mg daily, lamotrigine  150 mg daily, topiramate  200 mg daily, propranolol  20 mg TID. Declined TMS    She reports significant worsening in irritability, nausea, weight loss and headache since uptitration of Viibryd .  Will switch back to Pristiq  at this time given it was effective to some extent.  Discussed potential risk of discontinuation symptoms.  She agrees to contact her primary care provider if her physical symptoms does not improve with despite this medication change.  It has been discussed at length regarding treatment options, including TMS and esketamine treatment.  She is willing to consider these options, and voiced understanding that we will make referral if  interested.  Will continue other medication regimen; will continue BuSpar  for anxiety, L-methylfolate for adjunctive treatment for depression.  Will continue propranolol  as needed for anxiety.   # Insomnia Significant worsening in the setting of experiencing adverse reaction from Viibryd  as outlined above.  Will continue current dose of trazodone  at this time to target insomnia while making intervention for the mood. Will consider reconnection with Ms. Terri for CBT-I at her next visit.    3. Binge eating disorder, unspecified severity She reports significant worsening in appetit/nausea, related to adverse reaction from Viibryd .  She expressed understanding regarding the possibility of tapering down topiramate  if no improvement despite discontinuation of Viibryd .    4. Iron deficiency - ferritin 25 10/2023  She has been taking iron tablet with vitamin C.  Will consider recheck in several months.     Plan  Discontinue vilazodone   Start Pristiq  25 mg daily for 1 week, then 50 mg daily for 1 week, then 100 mg daily  Continue Buspar   7.5 mg twice a day Continue propranolol  20 mg daily as needed for anxiety- she declined refill Continue topiramate  200 mg daily Continue L-methylfolate 15 mg daily Continue iron sulfate 325 mg daily Continue trazodone  25-50 mg at night as needed for insomnia - she was advised to take it early to reduce the risk of drowsiness Next appointment-  12/29 at 8 am, video  - prazosin  2 mg every other night for hypertension - on tramadol - EKG QTc 394 msec on 11/2020   Past trials of medication- sertraline , lexapro, venlafaxine, bupropion  (palpitation), Abilify, quetiapine  (eating sweets), Vraylar  (good effect, but caused increase in appetite), Rexulti  (good effect, but unable to afford) Geodon  (increase in appetite). Trazodone  (fatigue), Ambien , Lunesta  (severe drowsiness), lemborexant  (limited benefit), ramelteon  (limited benefit)   The patient demonstrates the following  risk factors for suicide: Chronic risk factors for suicide include: psychiatric disorder of depression . Acute risk factors for suicide include:  her family suffering from medical illness . Protective factors for this patient include: positive social support, coping skills, and hope for the future. Considering these factors, the overall suicide risk at this point appears to be moderate, but not at imminent risk. Patient is appropriate for outpatient follow up. Emergency resources which includes 911, ED, suicide crisis line (988) are discussed.      Collaboration of Care: Collaboration of Care: Other reviewed notes in Epic  Patient/Guardian was advised Release of Information must be obtained prior to any record release in order to collaborate their care with an outside provider. Patient/Guardian was advised if they have not already done so to contact the registration department to sign all necessary forms in order for us  to release information regarding their care.   Consent: Patient/Guardian gives verbal consent for treatment and assignment of benefits for services provided during this visit. Patient/Guardian expressed understanding and agreed to proceed.    [name], MD 09/09/2024, 5:55 PM

## 2024-09-09 ENCOUNTER — Telehealth (INDEPENDENT_AMBULATORY_CARE_PROVIDER_SITE_OTHER): Admitting: Psychiatry

## 2024-09-09 ENCOUNTER — Encounter: Payer: Self-pay | Admitting: Psychiatry

## 2024-09-09 DIAGNOSIS — F411 Generalized anxiety disorder: Secondary | ICD-10-CM

## 2024-09-09 DIAGNOSIS — F331 Major depressive disorder, recurrent, moderate: Secondary | ICD-10-CM | POA: Diagnosis not present

## 2024-09-09 DIAGNOSIS — F50819 Binge eating disorder, unspecified: Secondary | ICD-10-CM | POA: Diagnosis not present

## 2024-09-09 DIAGNOSIS — G4700 Insomnia, unspecified: Secondary | ICD-10-CM | POA: Diagnosis not present

## 2024-09-09 MED ORDER — DESVENLAFAXINE SUCCINATE ER 25 MG PO TB24: ORAL_TABLET | ORAL | 0 refills | Status: DC

## 2024-09-09 MED ORDER — DESVENLAFAXINE SUCCINATE ER 100 MG PO TB24: 100.0000 mg | ORAL_TABLET | Freq: Every day | ORAL | 0 refills | Status: AC

## 2024-09-10 ENCOUNTER — Telehealth: Payer: Self-pay

## 2024-09-10 NOTE — Telephone Encounter (Signed)
 PA was submitted today for patients Desvenlafaxine  Succinate ER tablets. This was submitted VIA cover my meds, with pharmacy notified.   Medication approved until 10/16/2025.   JNL

## 2024-09-10 NOTE — Telephone Encounter (Signed)
 Pt called today in regards to her vilazodone . Pharmacy is under the impression that patient will be using both vilazodone  and newly prescribed Pristiq . Reached out to pharmacy

## 2024-09-16 ENCOUNTER — Ambulatory Visit: Payer: Self-pay

## 2024-09-16 ENCOUNTER — Ambulatory Visit: Payer: Self-pay | Admitting: Internal Medicine

## 2024-09-16 ENCOUNTER — Ambulatory Visit
Admission: RE | Admit: 2024-09-16 | Discharge: 2024-09-16 | Disposition: A | Attending: Internal Medicine | Admitting: Internal Medicine

## 2024-09-16 ENCOUNTER — Ambulatory Visit
Admission: RE | Admit: 2024-09-16 | Discharge: 2024-09-16 | Disposition: A | Source: Ambulatory Visit | Attending: Internal Medicine | Admitting: Internal Medicine

## 2024-09-16 ENCOUNTER — Ambulatory Visit: Admitting: Internal Medicine

## 2024-09-16 ENCOUNTER — Encounter: Payer: Self-pay | Admitting: Internal Medicine

## 2024-09-16 VITALS — BP 138/86 | HR 71 | Temp 98.2°F | Ht 70.0 in | Wt 148.0 lb

## 2024-09-16 DIAGNOSIS — R0781 Pleurodynia: Secondary | ICD-10-CM | POA: Diagnosis not present

## 2024-09-16 DIAGNOSIS — R0602 Shortness of breath: Secondary | ICD-10-CM | POA: Diagnosis not present

## 2024-09-16 DIAGNOSIS — R059 Cough, unspecified: Secondary | ICD-10-CM | POA: Diagnosis not present

## 2024-09-16 DIAGNOSIS — J40 Bronchitis, not specified as acute or chronic: Secondary | ICD-10-CM

## 2024-09-16 LAB — POC COVID19/FLU A&B COMBO
Covid Antigen, POC: NEGATIVE
Influenza A Antigen, POC: NEGATIVE
Influenza B Antigen, POC: NEGATIVE

## 2024-09-16 MED ORDER — HYDROCODONE BIT-HOMATROP MBR 5-1.5 MG/5ML PO SOLN: 5.0000 mL | Freq: Four times a day (QID) | ORAL | 0 refills | Status: AC | PRN

## 2024-09-16 MED ORDER — DOXYCYCLINE HYCLATE 100 MG PO TABS: 100.0000 mg | ORAL_TABLET | Freq: Two times a day (BID) | ORAL | 0 refills | Status: AC

## 2024-09-16 MED ORDER — ONDANSETRON 4 MG PO TBDP: 4.0000 mg | ORAL_TABLET | Freq: Three times a day (TID) | ORAL | 0 refills | Status: AC | PRN

## 2024-09-16 NOTE — Telephone Encounter (Signed)
 FYI Only or Action Required?: , pt refused, requested appt with PCP  Patient was last seen in primary care on 07/09/2024 by Justus Leita DEL, MD.  Called Nurse Triage reporting Influenza.  Symptoms began several days ago.  Interventions attempted: Nothing.  Symptoms are: gradually worsening.  Triage Disposition: Go to ED or PCP/Alternative with Approval  Patient/caregiver understands and will follow disposition?: Yes, will follow disposition  Copied from CRM #[id number]. Topic: Clinical - Red Word Triage >> Sep 16, 2024  8:01 AM Charlet HERO wrote: Red Word that prompted transfer to Nurse Triage: Patient is calling with shortness of breath , extreme headache nasuea, she thinks it may be pnuemonia or flu also has ear ache and pain. Dr. Justus Reason for Disposition  [1] Difficulty breathing AND [2] not severe AND [3] not from stuffy nose (e.g., not relieved by cleaning out the nose)  Answer Assessment - Initial Assessment Questions 1. WORST SYMPTOM: What is your worst symptom? (e.g., cough, runny nose, muscle aches, headache, sore throat, fever)      Cough, deep 2. ONSET: When did your flu symptoms start?      About 2 days ago 3. COUGH: How bad is the cough?       severe 4. RESPIRATORY DISTRESS: Describe your breathing.      Hard to take deep breath in  5. FEVER: Do you have a fever? If Yes, ask: What is your temperature, how was it measured, and when did it start?     denies 6. EXPOSURE: Were you exposed to someone with influenza?       unsure 7. FLU VACCINE: Did you get a flu shot this year?     Covid test-negative, pt states that she believes she has flu shot 8. HIGH RISK DISEASE: Do you have any chronic medical problems? (e.g., heart or lung disease, asthma, weak immune system, or other HIGH RISK conditions)     Denies  10. OTHER SYMPTOMS: Do you have any other symptoms?  (e.g., runny nose, muscle aches, headache, sore throat)       Sneezing, cough, ear ache,  sore throat  Pt states that she has difficulty taking a deep breath, refuses ED at this time. Requests to see PCP today.  Protocols used: Influenza (Flu) - [facility]

## 2024-09-16 NOTE — Progress Notes (Signed)
 Date:  09/16/2024   Name:  Toni Green   DOB:  1961/01/29   MRN:  [id number]   Chief Complaint: Cough (2 days- Cough- yellow production. SOB. Body aches, Chills. Headache. Nausea, and dry heaving. Loss of appetite. Hurts to take a deep breath. )  Cough This is a new problem. The current episode started yesterday. The problem has been gradually worsening. The problem occurs every few minutes. The cough is Productive of sputum. Associated symptoms include chest pain, ear pain, headaches, a sore throat and shortness of breath. Pertinent negatives include no chills, fever or wheezing.    Review of Systems  Constitutional:  Positive for fatigue. Negative for chills, diaphoresis and fever.  HENT:  Positive for ear pain and sore throat.   Respiratory:  Positive for cough and shortness of breath. Negative for chest tightness and wheezing.   Cardiovascular:  Positive for chest pain. Negative for leg swelling.  Gastrointestinal:  Positive for nausea. Negative for constipation, diarrhea and vomiting.  Neurological:  Positive for headaches. Negative for dizziness and light-headedness.  Psychiatric/Behavioral:  Negative for sleep disturbance.      Lab Results  Component Value Date   NA 144 12/21/2023   K 4.0 12/21/2023   CO2 22 12/21/2023   GLUCOSE 89 12/21/2023   BUN 10 12/21/2023   CREATININE 0.67 12/21/2023   CALCIUM 8.9 12/21/2023   EGFR 99 12/21/2023   GFRNONAA 77 01/06/2020   Lab Results  Component Value Date   CHOL 183 12/21/2023   HDL 100 12/21/2023   LDLCALC 73 12/21/2023   TRIG 47 12/21/2023   CHOLHDL 1.8 12/21/2023   Lab Results  Component Value Date   TSH 3.300 12/21/2023   No results found for: HGBA1C Lab Results  Component Value Date   WBC 7.7 12/21/2023   HGB 13.2 12/21/2023   HCT 40.2 12/21/2023   MCV 95 12/21/2023   PLT 263 12/21/2023   Lab Results  Component Value Date   ALT 14 12/21/2023   AST 16 12/21/2023   ALKPHOS 99 12/21/2023   BILITOT  0.3 12/21/2023   Lab Results  Component Value Date   VD25OH 33.6 12/21/2023     Patient Active Problem List   Diagnosis Date Noted   Mixed hyperlipidemia 02/01/2022   Reactive hypoglycemia 12/21/2021   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   Moderately severe major depression (HCC) 01/06/2021   S/P total hysterectomy 11/25/2020   Hemorrhoids    Anxiety state 10/14/2019   Binge eating disorder 10/14/2019   Restless leg syndrome 11/30/2018   S/P gastric bypass 08/07/2017   Special screening for malignant neoplasms, colon    Benign neoplasm of ascending colon    Polyp of sigmoid colon    Menopause syndrome 07/26/2016   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   Essential (primary) hypertension 07/20/2015   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 disorder 07/20/2015   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   DDD (degenerative disc disease), cervical 03/17/2014   Calculus of kidney 02/13/2013    Allergies  Allergen Reactions   Losartan Palpitations    Could feel heartbeat (strongly) in neck   Penicillins Rash   Tape Rash    Skin irritation after back surgery white paper tape    Past Surgical History:  Procedure Laterality Date   APPENDECTOMY  2010   BACK SURGERY  10/2005   lumbar   COLONOSCOPY WITH PROPOFOL  N/A 09/23/2016   Procedure: COLONOSCOPY WITH PROPOFOL ;  Surgeon: Rogelia Copping, MD;  Location: [facility];  Service: Endoscopy;  Laterality: N/A;   COLOSTOMY  10/22/2021   CYSTOSCOPY     x 1   EVALUATION UNDER ANESTHESIA WITH HEMORRHOIDECTOMY N/A 07/06/2020   Procedure: EXAM UNDER ANESTHESIA WITH HEMORRHOIDECTOMY;  Surgeon: Marolyn Nest, MD;  Location: [facility];  Service: General;  Laterality: N/A;   GALLBLADDER SURGERY  2009   lap   GASTRIC BYPASS  2008   roux en y   MUCOSAL ADVANCEMENT FLAP N/A 12/10/2020   Procedure: MUCOSAL ADVANCEMENT FLAP;  Surgeon: Debby Hila, MD;  Location: [facility];  Service:  General;  Laterality: N/A;  60 MIN   POLYPECTOMY  09/23/2016   Procedure: POLYPECTOMY;  Surgeon: Rogelia Copping, MD;  Location: [facility];  Service: Endoscopy;;   TOTAL VAGINAL HYSTERECTOMY  1999    Social History   Tobacco Use   Smoking status: Never   Smokeless tobacco: Never  Vaping Use   Vaping status: Never Used  Substance Use Topics   Alcohol use: Yes    Alcohol/week: 0.0 standard drinks of alcohol    Comment: RARE   Drug use: No     Medication list has been reviewed and updated.  Current Meds  Medication Sig   alendronate (FOSAMAX) 70 MG tablet Take 70 mg by mouth once a week.   Blood Glucose Monitoring Suppl (ONETOUCH VERIO REFLECT) w/Device KIT 1 each by Does not apply route daily at 6 (six) AM.   busPIRone  (BUSPAR ) 7.5 MG tablet Take 1 tablet (7.5 mg total) by mouth 2 (two) times daily.   calcitonin, salmon, (MIACALCIN/FORTICAL) 200 UNIT/ACT nasal spray SMARTSIG:Both Nares   cetirizine (ZYRTEC) 10 MG tablet Take 10 mg by mouth daily.   [START ON 09/22/2024] desvenlafaxine  (PRISTIQ ) 100 MG 24 hr tablet Take 1 tablet (100 mg total) by mouth daily. Start after completing 50 mg daily for 7 days   desvenlafaxine  (PRISTIQ ) 25 MG 24 hr tablet Take 1 tablet (25 mg total) by mouth daily for 7 days, THEN 2 tablets (50 mg total) daily for 7 days.   doxycycline  (VIBRA -TABS) 100 MG tablet Take 1 tablet (100 mg total) by mouth 2 (two) times daily for 10 days.   famotidine  (PEPCID ) 40 MG tablet Take by mouth.   fluticasone  (FLONASE ) 50 MCG/ACT nasal spray Place 2 sprays into both nostrils daily.   furosemide  (LASIX ) 20 MG tablet TAKE 1 TABLET DAILY (Patient taking differently: Take 20 mg by mouth daily as needed (fluid retention.).)   glucose blood test strip Use as instructed qid   HYDROcodone  bit-homatropine (HYCODAN) 5-1.5 MG/5ML syrup Take 5 mLs by mouth every 6 (six) hours as needed for up to 10 days for cough.   ibuprofen  (ADVIL ) 800 MG tablet Take 1 tablet (800 mg total)  by mouth every 8 (eight) hours as needed.   irbesartan  (AVAPRO ) 150 MG tablet Take 1 tablet (150 mg total) by mouth daily.   levothyroxine  (SYNTHROID ) 75 MCG tablet Take 1 tablet (75 mcg total) by mouth daily before breakfast.   methocarbamol  (ROBAXIN ) 500 MG tablet 1/2-1 po qHS prn   ondansetron  (ZOFRAN ) 8 MG tablet Take 8 mg by mouth every 8 (eight) hours as needed for nausea or vomiting.   ondansetron  (ZOFRAN -ODT) 4 MG disintegrating tablet Take 1 tablet (4 mg total) by mouth every 8 (eight) hours as needed for nausea or vomiting.   OneTouch Delica Lancets 30G MISC 1 each by Does not apply route 4 (four) times daily as needed.   propranolol  (INDERAL ) 20 MG  tablet Take 1 tablet (20 mg total) by mouth daily as needed. for anxiety   rOPINIRole  (REQUIP ) 4 MG tablet Take 1 tablet (4 mg total) by mouth at bedtime.   tamsulosin  (FLOMAX ) 0.4 MG CAPS capsule Take 1 capsule (0.4 mg total) by mouth daily as needed. (Patient taking differently: Take 0.4 mg by mouth daily as needed (Kidney stones).)   topiramate  (TOPAMAX ) 200 MG tablet Take 1 tablet (200 mg total) by mouth at bedtime.   traMADol (ULTRAM) 50 MG tablet Take 25-50 mg by mouth 2 (two) times daily as needed (back pain.).    traZODone  (DESYREL ) 50 MG tablet Take 0.5-1 tablets (25-50 mg total) by mouth at bedtime.   valACYclovir (VALTREX) 1000 MG tablet as needed.       09/16/2024    8:52 AM 07/09/2024    9:17 AM 03/27/2024    3:32 PM 12/21/2023    9:18 AM  GAD 7 : Generalized Anxiety Score  Nervous, Anxious, on Edge 1 3 3 3   Control/stop worrying 1 3 3 3   Worry too much - different things 1 3 3 3   Trouble relaxing 1 3 3 3   Restless 1 3 3 3   Easily annoyed or irritable 1 3 3 3   Afraid - awful might happen 1 3 3 3   Total GAD 7 Score 7 21 21 21   Anxiety Difficulty Somewhat difficult Very difficult Extremely difficult Somewhat difficult       09/16/2024    8:52 AM 07/09/2024    9:16 AM 03/27/2024    3:32 PM  Depression screen PHQ 2/9   Decreased Interest 1 3 3   Down, Depressed, Hopeless 1 3 3   PHQ - 2 Score 2 6 6   Altered sleeping 1 3 3   Tired, decreased energy 1 3 3   Change in appetite 0 0 1  Feeling bad or failure about yourself  1 3 3   Trouble concentrating 1 3 3   Moving slowly or fidgety/restless 0 0 3  Suicidal thoughts 0 0 0  PHQ-9 Score 6 18  22    Difficult doing work/chores Not difficult at all Very difficult Extremely dIfficult     Data saved with a previous flowsheet row definition    BP Readings from Last 3 Encounters:  09/16/24 138/86  07/09/24 108/68  03/27/24 118/78    Physical Exam Constitutional:      Appearance: She is ill-appearing.  HENT:     Head: Normocephalic.     Right Ear: Tympanic membrane normal.     Left Ear: Tympanic membrane normal.  Cardiovascular:     Rate and Rhythm: Normal rate and regular rhythm.  Pulmonary:     Effort: Pulmonary effort is normal. No respiratory distress.     Breath sounds: No wheezing or rhonchi.  Chest:     Chest wall: No tenderness.  Musculoskeletal:     Cervical back: Normal range of motion.  Lymphadenopathy:     Cervical: No cervical adenopathy.  Skin:    General: Skin is warm and dry.  Neurological:     General: No focal deficit present.     Mental Status: She is alert.     Wt Readings from Last 3 Encounters:  09/16/24 148 lb (67.1 kg)  07/09/24 163 lb (73.9 kg)  03/27/24 173 lb 2 oz (78.5 kg)    BP 138/86   Pulse 71   Temp 98.2 F (36.8 C) (Oral)   Ht 5' 10 (1.778 m)   Wt 148 lb (67.1 kg)  SpO2 97%   BMI 21.24 kg/m   Assessment and Plan:  Problem List Items Addressed This Visit   None Visit Diagnoses       SOB (shortness of breath)    -  Primary   Relevant Orders   POC Covid19/Flu A&B Antigen (Completed)   DG Chest 2 View     Tracheobronchitis       Push fluids, rest Take Advil  every 8 hour for pleuritic chest pain   Relevant Medications   doxycycline  (VIBRA -TABS) 100 MG tablet   HYDROcodone  bit-homatropine  (HYCODAN) 5-1.5 MG/5ML syrup   ondansetron  (ZOFRAN -ODT) 4 MG disintegrating tablet       No follow-ups on file.    Leita HILARIO Adie, MD [facility] [place]

## 2024-09-24 MED ORDER — DESVENLAFAXINE SUCCINATE ER 100 MG PO TB24: 100.0000 mg | ORAL_TABLET | Freq: Every day | ORAL | 0 refills | Status: AC

## 2024-09-24 MED ORDER — TRAZODONE HCL 50 MG PO TABS: 25.0000 mg | ORAL_TABLET | Freq: Every day | ORAL | 0 refills | Status: AC

## 2024-09-24 NOTE — Telephone Encounter (Signed)
 Dr Merrilyn, I don't know if I've developed some scar tissue from my surgery. I am extremely constipated even after taking 2 laxatives last night. Stool is very hard. I haven't pooped in several days. Thought it was due to taking doxycycline  for pleurisy but I've never had trouble taking that med before. I've been experiencing nausea for about 3 months, it's worsening. Lost about 23 lbs in that timeframe as well. Pain in belly, more on left side. No fever. Chills. Very sick and miserable today.   S:  Constipation and not feeling well  B: Last office visit 04/04/2023   Rectovaginal fistula Dx  Plan for colonoscopy Diagnosed wih vibrio fecal and c diff positive. Will follow up with us  prn.   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  She took two laxatives last night.  Dulcolax taken last night.  Barely passing gas.  Currently on toilet and very nauseated.   Nausea for three months that has gotten worse.  Also unplanned weight loss of 23 lbs in past three months.  Left side abdominal pain.  No fever but experiencing chills.  Feels very sick today.  She has not informed any of her other providers of these symptoms.      R:  Proceed to our E.D. for evaluation of possible obstruction.  Pt states her husband will bring her.

## 2024-10-01 NOTE — Progress Notes (Signed)
 Virtual Visit via Video Note  I connected with Toni Green on 10/14/2024 at  8:00 AM EST by a video enabled telemedicine application and verified that I am speaking with the correct person using two identifiers.  Location: Patient: home Provider: office Persons participated in the visit- patient, provider    I discussed the limitations of evaluation and management by telemedicine and the availability of in person appointments. The patient expressed understanding and agreed to proceed.    I discussed the assessment and treatment plan with the patient. The patient was provided an opportunity to ask questions and all were answered. The patient agreed with the plan and demonstrated an understanding of the instructions.   The patient was advised to call back or seek an in-person evaluation if the symptoms worsen or if the condition fails to improve as anticipated.    [name], MD    [facility] MD/PA/NP OP Progress Note  10/14/2024 5:58 PM Toni Green  MRN:  [id number]  Chief Complaint:  Chief Complaint  Patient presents with   Follow-up   HPI:  She presents to ED with abdominal pain, nausea secondary to constipation 09/2024.  She underwent CT abdomen pelvis with contrast 1.  No acute abnormality.  2.  No evidence for bowel obstruction.  3.  Postoperative changes as described.  4.  Decreasing biliary distention. Suggest clinical correlation and correlation with laboratory values. If further imaging is required consider MRCP.  5.  Decreased colonic stool burden compared to prior study.   This is a follow-up appointment for depression, anxiety, insomnia.  She states that she has been doing better.  She is not crying and is not in range, which is a huge improvement.  Her husband also see a big difference.  It was the worst ever when she was on vilazodone . She has been taking pristiq  for about a few weeks.  She asks if she can be on lamotrigine .  She wonders if it could be  helpful as she has been off this medication for a while.  She states that she is doing babysitting her great niece 2 days a week.  This is her sisters granddaughter.  She gives her peace.  She may be awake a few hours after taking trazodone , and reports some fatigue in the morning. She is unsure if this is related to trazodone .  She is scheduled for upper endoscopy for possible ulcer. She is currently taking the medication for ulcer, which has helped her nausea.  Although she has been taking topiramate  every other day, she does not want to reduce the dose at this time as she is concerned about weight gain.  She has been taking less propranolol  since being off vilazodone /experiences less anxiety.  She denies SI, hallucinations.  She agrees with the plans as outlined below.    Visit Diagnosis:    ICD-10-CM   1. Anxiety state  F41.1     2. Major depressive disorder, recurrent episode, moderate (HCC)  F33.1 busPIRone  (BUSPAR ) 7.5 MG tablet    3. Insomnia, unspecified type  G47.00     4. Binge eating disorder, unspecified severity  F50.819       Past Psychiatric History: Please see initial evaluation for full details. I have reviewed the history. No updates at this time.     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   Claustrophobia    Complication of surgery 02/20/2015   Overview:  Dilated pouch found on EGD done 12/2014    Depression    History of colostomy reversal 11/2022   History of kidney stones    Hypertension    Hypoglycemia    Hypothyroidism    Inflammation of sacroiliac joint 08/22/2014   Pneumonia 03/2020   PONV (postoperative nausea and vomiting)    NASUEATED/BAD HEADACHES   Seizures (HCC) 1985   x1, after labor/childbirth   Thyroid disease    Wears  glasses     Past Surgical History:  Procedure Laterality Date   APPENDECTOMY  2010   BACK SURGERY  10/2005   lumbar   COLONOSCOPY WITH PROPOFOL  N/A 09/23/2016   Procedure: COLONOSCOPY WITH PROPOFOL ;  Surgeon: Rogelia Copping, MD;  Location: [facility];  Service: Endoscopy;  Laterality: N/A;   COLOSTOMY  10/22/2021   CYSTOSCOPY     x 1   EVALUATION UNDER ANESTHESIA WITH HEMORRHOIDECTOMY N/A 07/06/2020   Procedure: EXAM UNDER ANESTHESIA WITH HEMORRHOIDECTOMY;  Surgeon: Marolyn Nest, MD;  Location: [facility];  Service: General;  Laterality: N/A;   GALLBLADDER SURGERY  2009   lap   GASTRIC BYPASS  2008   roux en y   MUCOSAL ADVANCEMENT FLAP N/A 12/10/2020   Procedure: MUCOSAL ADVANCEMENT FLAP;  Surgeon: Debby Hila, MD;  Location: [facility];  Service: General;  Laterality: N/A;  60 MIN   POLYPECTOMY  09/23/2016   Procedure: POLYPECTOMY;  Surgeon: Rogelia Copping, MD;  Location: [facility];  Service: Endoscopy;;   TOTAL VAGINAL HYSTERECTOMY  1999    Family Psychiatric History: Please see initial evaluation for full details. I have reviewed the history. No updates at this time.     Family History:  Family History  Problem Relation Age of Onset   Depression Mother    Hypertension Mother    Depression Father    Hypertension Father    Depression Sister    Breast cancer Sister 53   Hypertension Maternal Grandfather    Depression Maternal Grandmother    Hypertension Maternal Grandmother    Hypertension Paternal Grandfather    Hypertension Paternal Grandmother     Social History:  Social History   Socioeconomic History   Marital status: Married    Spouse name: Not on file   Number of children: Not on file   Years of education: Not on file   Highest education level: Associate degree: occupational, scientist, product/process development, or vocational program  Occupational History   Not on file  Tobacco Use   Smoking status: Never   Smokeless tobacco: Never  Vaping Use    Vaping status: Never Used  Substance and Sexual Activity   Alcohol use: Yes    Alcohol/week: 0.0 standard drinks of alcohol    Comment: RARE   Drug use: No   Sexual activity: Not Currently  Other Topics Concern   Not on file  Social History Narrative   Not on file   Social Drivers of Health   Tobacco Use: Low Risk (10/14/2024)   Patient History    Smoking Tobacco Use: Never    Smokeless Tobacco Use: Never    Passive Exposure: Not on file  Financial Resource Strain: Low Risk  (08/23/2024)   Received from [facility]   Overall Financial Resource Strain (  CARDIA)    Difficulty of Paying Living Expenses: Not hard at all  Food Insecurity: Low Risk (10/03/2024)   Received from [facility]   Epic    Within the past 12 months, you worried that your food would run out before you got money to buy more: Never true    Within the past 12 months, the food you bought just didn't last and you didn't have money to get more. : Never true  Transportation Needs: No Transportation Needs (10/03/2024)   Received from [facility]    In the past 12 months, has lack of reliable transportation kept you from medical appointments, meetings, work or from getting things needed for daily living? : No  Physical Activity: Unknown (07/04/2023)   Exercise Vital Sign    Days of Exercise per Week: 0 days    Minutes of Exercise per Session: Not on file  Stress: Stress Concern Present (07/04/2023)   [employer] of Occupational Health - Occupational Stress Questionnaire    Feeling of Stress : Very much  Social Connections: Moderately Integrated (07/04/2023)   Social Connection and Isolation Panel    Frequency of Communication with Friends and Family: Three times a week    Frequency of Social Gatherings with Friends and Family: Never    Attends Religious Services: Never    Database Administrator or Organizations: Yes    Attends Banker Meetings: Never     Marital Status: Married  Depression (PHQ2-9): Medium Risk (09/16/2024)   Depression (PHQ2-9)    PHQ-2 Score: 6  Alcohol Screen: Medium Risk (07/04/2023)   Alcohol Screen    Last Alcohol Screening Score (AUDIT): 9  Housing: Low Risk (10/03/2024)   Received from [facility]   Epic    What is your living situation today?: I have a steady place to live    Think about the place you live. Do you have problems with any of the following? Choose all that apply:: None/None on this list  Utilities: Low Risk (10/03/2024)   Received from [facility]   Utilities    In the past 12 months has the electric, gas, oil, or water  company threatened to shut off services in your home? : No  Health Literacy: Not on file    Allergies: Allergies[1]  Metabolic Disorder Labs: No results found for: HGBA1C, MPG No results found for: PROLACTIN Lab Results  Component Value Date   CHOL 183 12/21/2023   TRIG 47 12/21/2023   HDL 100 12/21/2023   CHOLHDL 1.8 12/21/2023   LDLCALC 73 12/21/2023   LDLCALC 94 02/01/2022   Lab Results  Component Value Date   TSH 3.300 12/21/2023   TSH 2.330 02/01/2022    Therapeutic Level Labs: No results found for: LITHIUM No results found for: VALPROATE No results found for: CBMZ  Current Medications: Current Outpatient Medications  Medication Sig Dispense Refill   desvenlafaxine  (PRISTIQ ) 100 MG 24 hr tablet Take 1 tablet (100 mg total) by mouth daily. 90 tablet 0   L-Methylfolate 15 MG TABS Take 1 tablet (15 mg total) by mouth daily. 90 tablet 0   topiramate  (TOPAMAX ) 200 MG tablet Take 1 tablet (200 mg total) by mouth at bedtime. (Patient taking differently: Take 200 mg by mouth every other day.) 90 tablet 0   alendronate (FOSAMAX) 70 MG tablet Take 70 mg by mouth once a week.     Blood Glucose Monitoring Suppl (ONETOUCH VERIO REFLECT) w/Device KIT 1 each by Does not  apply route daily at 6 (six) AM. 1 kit 0   [START ON 11/02/2024] busPIRone  (BUSPAR ) 7.5  MG tablet Take 1 tablet (7.5 mg total) by mouth 2 (two) times daily. 180 tablet 1   calcitonin, salmon, (MIACALCIN/FORTICAL) 200 UNIT/ACT nasal spray SMARTSIG:Both Nares     cetirizine (ZYRTEC) 10 MG tablet Take 10 mg by mouth daily.     desvenlafaxine  (PRISTIQ ) 100 MG 24 hr tablet Take 1 tablet (100 mg total) by mouth daily. Start after completing 50 mg daily for 7 days 30 tablet 0   desvenlafaxine  (PRISTIQ ) 25 MG 24 hr tablet Take 1 tablet (25 mg total) by mouth daily for 7 days, THEN 2 tablets (50 mg total) daily for 7 days. 21 tablet 0   famotidine  (PEPCID ) 40 MG tablet Take by mouth.     fluticasone  (FLONASE ) 50 MCG/ACT nasal spray Place 2 sprays into both nostrils daily. 16 g 6   furosemide  (LASIX ) 20 MG tablet TAKE 1 TABLET DAILY (Patient taking differently: Take 20 mg by mouth daily as needed (fluid retention.).) 90 tablet 3   glucose blood test strip Use as instructed qid 100 each 12   ibuprofen  (ADVIL ) 800 MG tablet Take 1 tablet (800 mg total) by mouth every 8 (eight) hours as needed. 30 tablet 0   irbesartan  (AVAPRO ) 150 MG tablet Take 1 tablet (150 mg total) by mouth daily. 90 tablet 1   levothyroxine  (SYNTHROID ) 75 MCG tablet Take 1 tablet (75 mcg total) by mouth daily before breakfast. 90 tablet 1   methocarbamol  (ROBAXIN ) 500 MG tablet 1/2-1 po qHS prn     ondansetron  (ZOFRAN ) 8 MG tablet Take 8 mg by mouth every 8 (eight) hours as needed for nausea or vomiting.     ondansetron  (ZOFRAN -ODT) 4 MG disintegrating tablet Take 1 tablet (4 mg total) by mouth every 8 (eight) hours as needed for nausea or vomiting. 20 tablet 0   OneTouch Delica Lancets 30G MISC 1 each by Does not apply route 4 (four) times daily as needed. 100 each 12   propranolol  (INDERAL ) 20 MG tablet Take 1 tablet (20 mg total) by mouth daily as needed. for anxiety 90 tablet 0   rOPINIRole  (REQUIP ) 4 MG tablet Take 1 tablet (4 mg total) by mouth at bedtime. 90 tablet 2   tamsulosin  (FLOMAX ) 0.4 MG CAPS capsule Take 1  capsule (0.4 mg total) by mouth daily as needed. (Patient taking differently: Take 0.4 mg by mouth daily as needed (Kidney stones).) 30 capsule 2   traMADol (ULTRAM) 50 MG tablet Take 25-50 mg by mouth 2 (two) times daily as needed (back pain.).      traZODone  (DESYREL ) 50 MG tablet Take 0.5-1 tablets (25-50 mg total) by mouth at bedtime. 90 tablet 0   valACYclovir (VALTREX) 1000 MG tablet as needed.     No current facility-administered medications for this visit.     Musculoskeletal: Strength & Muscle Tone: N/A Gait & Station: N/A Patient leans: N/A  Psychiatric Specialty Exam: Review of Systems  Psychiatric/Behavioral:  Positive for dysphoric mood and sleep disturbance. Negative for agitation, behavioral problems, confusion, decreased concentration, hallucinations, self-injury and suicidal ideas. The patient is nervous/anxious. The patient is not hyperactive.   All other systems reviewed and are negative.   There were no vitals taken for this visit.There is no height or weight on file to calculate BMI.  General Appearance: Well Groomed  Eye Contact:  Good  Speech:  Clear and Coherent  Volume:  Normal  Mood:  better  Affect:  Appropriate, Congruent, and calm  Thought Process:  Coherent  Orientation:  Full (Time, Place, and Person)  Thought Content: Logical   Suicidal Thoughts:  No  Homicidal Thoughts:  No  Memory:  Immediate;   Good  Judgement:  Good  Insight:  Good  Psychomotor Activity:  Normal  Concentration:  Concentration: Good and Attention Span: Good  Recall:  Good  Fund of Knowledge: Good  Language: Good  Akathisia:  No  Handed:  Right  AIMS (if indicated): not done  Assets:  Communication Skills Desire for Improvement  ADL's:  Intact  Cognition: WNL  Sleep:  Poor   Screenings: AUDIT    Flowsheet Row Appointment from 08/08/2023 in [facility] at [facility] Most recent reading at 07/04/2023  2:07 PM Appointment from  07/05/2023 in [facility] at [facility] Most recent reading at 07/04/2023  2:07 PM  Alcohol Use Disorder Identification Test Final Score (AUDIT) 9  9    GAD-7    Flowsheet Row Office Visit from 09/16/2024 in [facility] at [facility] Office Visit from 07/09/2024 in [facility] at [facility] Office Visit from 03/27/2024 in [facility] at [facility] Office Visit from 12/21/2023 in [facility] at [facility] Office Visit from 10/24/2023 in [facility]  Total GAD-7 Score 7 21 21 21 21    PHQ2-9    Flowsheet Row Office Visit from 09/16/2024 in [facility] Medicine at [facility] Office Visit from 07/09/2024 in [facility] Medicine at [facility] Office Visit from 03/27/2024 in [facility] Medicine at [facility] Office Visit from 12/21/2023 in [facility] at [facility] Office Visit from 10/24/2023 in [facility]  PHQ-2 Total Score 2 6 6 6 6   PHQ-9 Total Score 6 18 22 16 21    Flowsheet Row Office Visit from 06/28/2022 in [facility] Office Visit from 05/26/2022 in [facility] Office Visit from 05/05/2022 in [facility]  C-SSRS RISK CATEGORY No Risk No Risk No Risk     Assessment and Plan:  Toni Green is a 63 y.o. 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   1. Major depressive disorder, recurrent episode, moderate (HCC) 2. Anxiety state She experiences ongoing conflict with her mother, who has a medical condition. She received five surgery after hernia repair surgery related to fistula, and underwent colostomy reversal on February 23. She experienced loss of her sister in November 2022.  She experiences recent stressors, including ongoing communication difficulties with her mother with medical illness, and feelings of being criticized.   History: Tx from Dr. Vincente. Originally on Pristiq  100 mg daily, lamotrigine  150 mg daily, topiramate  200 mg daily, propranolol  20 mg TID. Declined TMS  The exam is notable for brighter affect.  She reports significant improvement in her mood symptoms, including rage/irritability since discontinuation of Viibryd , and  cross tapering back to Pristiq .  It is also notable that she has been taking care of her sister's granddaughter, who is 4.5 months old, who gives her joy.  Although she reports interest in restarting lamotrigine , she expressed understanding to stay on the current medication regimen as she has been back on Pristiq  only for a few weeks at this time.  She also expressed understanding about lack of strong indication to restart lamotrigine  for her condition, although this can be tried in the future as adjunctive treatment for depression, off label.  Noted that it was previously discussed to consider TMS/esketamine treatment.   Will continue current medication regimen at this time.  Will continue Pristiq  to target depression and anxiety, along with BuSpar  for anxiety.  Upon reviewing her medication list, she may not have been taking L-methylfolate.  Will start this medication as adjunctive treatment for depression especially given she is at risk.  Will continue propranolol  as needed for anxiety.   3. Insomnia, unspecified type She continues to struggle  with initial and middle insomnia.  She also reports daytime fatigue.  She agrees to try reducing the dose of trazodone  to see if she can reduce this risk.  Psychoeducation is provided regarding the possible adverse reaction of fatigue from metoprolol, topiramate . Will consider reconnection with Ms. Terri for CBT-I at her next visit.   4. Binge eating disorder, unspecified severity She will have upper GI endoscopy for possible ulcer, and reports some improvement in nausea since starting some medication for the condition.  Although it was advised to consider further tapering down lamotrigine , she has strong preference to stay at the current dose due to concern of weight gain.  Will continue the current dose a this time.    4. Iron deficiency - ferritin 25 10/2023  She has been taking iron tablet with vitamin C.  Will consider recheck in several months.     Plan  Continue Pristiq  100 mg daily  Continue Buspar  7.5 mg twice a day Continue propranolol  20 mg daily as needed for anxiety- she declined refill Continue topiramate  200 mg daily (currently takes ever other day) Restart L-methylfolate 15 mg daily - 90 days Continue iron sulfate 325 mg daily Decrease trazodone  25 mg at night as needed for insomnia - she was advised to take it early to reduce the risk of drowsiness Next appointment-  2/6 at 10:30, video  - prazosin  2 mg every other night for hypertension - on tramadol - EKG QTc 394 msec on 11/2020   Past trials of medication- sertraline , lexapro, venlafaxine, vilazodone  (rage, worsening in anxiety), bupropion  (palpitation), Abilify, quetiapine  (eating sweets), Vraylar  (good effect, but caused increase in appetite), Rexulti  (good effect, but unable to afford) Geodon  (increase in appetite). Trazodone  (fatigue), Ambien , Lunesta  (severe drowsiness), lemborexant  (limited benefit), ramelteon  (limited benefit)   The patient demonstrates the following risk factors for suicide: Chronic risk factors for  suicide include: psychiatric disorder of depression . Acute risk factors for suicide include:  her family suffering from medical illness . Protective factors for this patient include: positive social support, coping skills, and hope for the future. Considering these factors, the overall suicide risk at this point appears to be moderate, but not at imminent risk. Patient is appropriate for outpatient follow up. Emergency resources which includes 911, ED, suicide crisis line (988) are discussed.      Collaboration of Care: Collaboration of Care: Other reviewed notes in Epic  Patient/Guardian was advised Release of Information must be obtained prior to  any record release in order to collaborate their care with an outside provider. Patient/Guardian was advised if they have not already done so to contact the registration department to sign all necessary forms in order for us  to release information regarding their care.   Consent: Patient/Guardian gives verbal consent for treatment and assignment of benefits for services provided during this visit. Patient/Guardian expressed understanding and agreed to proceed.    [name], MD 10/14/2024, 5:58 PM     [1]  Allergies Allergen Reactions   Losartan Palpitations    Could feel heartbeat (strongly) in neck   Penicillins Rash   Tape Rash    Skin irritation after back surgery white paper tape

## 2024-10-02 ENCOUNTER — Encounter: Payer: Self-pay | Admitting: Internal Medicine

## 2024-10-14 ENCOUNTER — Telehealth: Admitting: Psychiatry

## 2024-10-14 ENCOUNTER — Encounter: Payer: Self-pay | Admitting: Psychiatry

## 2024-10-14 DIAGNOSIS — F411 Generalized anxiety disorder: Secondary | ICD-10-CM | POA: Diagnosis not present

## 2024-10-14 DIAGNOSIS — F50819 Binge eating disorder, unspecified: Secondary | ICD-10-CM

## 2024-10-14 DIAGNOSIS — F331 Major depressive disorder, recurrent, moderate: Secondary | ICD-10-CM | POA: Diagnosis not present

## 2024-10-14 DIAGNOSIS — G4700 Insomnia, unspecified: Secondary | ICD-10-CM

## 2024-10-14 MED ORDER — BUSPIRONE HCL 7.5 MG PO TABS: 7.5000 mg | ORAL_TABLET | Freq: Two times a day (BID) | ORAL | 1 refills | Status: AC

## 2024-10-14 MED ORDER — L-METHYLFOLATE 15 MG PO TABS: 15.0000 mg | ORAL_TABLET | Freq: Every day | ORAL | 0 refills | Status: AC

## 2024-10-14 NOTE — Patient Instructions (Signed)
 Continue Pristiq  100 mg daily  Continue Buspar  7.5 mg twice a day Continue propranolol  20 mg daily as needed for anxiety Continue topiramate  200 mg ever other day Restart L-methylfolate 15 mg daily  Continue iron sulfate 325 mg daily Decrease trazodone  25 mg at night as needed for insomnia  Next appointment-  2/6 at 10:30,

## 2024-10-15 ENCOUNTER — Encounter: Payer: Self-pay | Admitting: Family Medicine

## 2024-10-15 ENCOUNTER — Ambulatory Visit: Admitting: Family Medicine

## 2024-10-15 VITALS — BP 120/80 | HR 81 | Ht 70.0 in | Wt 149.0 lb

## 2024-10-15 DIAGNOSIS — S0990XA Unspecified injury of head, initial encounter: Secondary | ICD-10-CM | POA: Insufficient documentation

## 2024-10-15 DIAGNOSIS — S161XXA Strain of muscle, fascia and tendon at neck level, initial encounter: Secondary | ICD-10-CM | POA: Diagnosis not present

## 2024-10-15 DIAGNOSIS — S060X1A Concussion with loss of consciousness of 30 minutes or less, initial encounter: Secondary | ICD-10-CM | POA: Insufficient documentation

## 2024-10-15 MED ORDER — METHOCARBAMOL 500 MG PO TABS: 250.0000 mg | ORAL_TABLET | Freq: Three times a day (TID) | ORAL | 0 refills | Status: DC | PRN

## 2024-10-15 NOTE — Patient Instructions (Signed)
 VISIT SUMMARY:  Today, you were evaluated for head trauma, headache, and neck pain following a fall. A concussion with brief loss of consciousness was diagnosed, and a head CT scan was ordered. You were also assessed for neck strain and a scalp contusion.  YOUR PLAN:  CONCUSSION WITH BRIEF LOSS OF CONSCIOUSNESS: You have a moderate concussion with symptoms like headache, fatigue, and cognitive issues. No serious brain injury is suspected, but a head CT scan will confirm this. -Get a non-contrast head CT scan to rule out any serious brain injury or skull fracture. -Rest both physically and mentally. Avoid activities that worsen your symptoms, including driving and loud environments. -Do not drink alcohol and avoid smoke exposure during your recovery. -Stay hydrated and maintain good nutrition. -Gradually return to light physical activities like walking, but avoid strenuous exercise. -Watch for warning signs like worsening headache, nighttime headache, or new neurological symptoms, and seek urgent care if they occur. -Take omega-3 (DHA) supplements as instructed. Dosing details will be provided via MyChart. -Follow up in two weeks to check on your symptoms and recovery. -Contact us  via MyChart or phone if you have any concerns before your next appointment.  CERVICAL STRAIN WITH MUSCLE SPASM: You have significant neck pain and muscle tightness likely due to a whiplash injury from your fall. -Take methocarbamol  500 mg, half to one tablet as needed for muscle spasms. Be cautious of drowsiness and unsteadiness. -Use methocarbamol  mainly at home and avoid driving while taking it due to its side effects. -Your upcoming neck injection is still scheduled and not affected by your current symptoms. -Engage in gentle activities and rest as tolerated. Avoid activities that worsen your neck pain. -We will reassess your neck pain at your follow-up appointment in two weeks. - Use cervical collar when out,  otherwise use neck support while at home (high seat back, etc) - Use tizanidine if methocarbamol  insufficient  SCALP CONTUSION WITH LOCALIZED TENDERNESS: You have a tender spot on your scalp with a small indentation but no open wound or visible fracture. -Get a non-contrast head CT scan to check for any possible skull fracture or brain injury. -There is a low likelihood of a significant skull fracture, but we will confirm this with the CT scan results. -We will follow up on your CT scan results and reassess your condition at your next visit.  Omega-3s may support concussion recovery. Choose a fish oil or algae-based supplement with 1,000-2,000 mg EPA + DHA daily, typically 2-4 capsules, depending on the brand.

## 2024-10-15 NOTE — Progress Notes (Signed)
 "    Primary Care / Sports Medicine Office Visit  Patient Information:  [name], female DOB: [birth year] Age: 63 y.o. MRN: [id number]   Toni Green is a pleasant 63 y.o. female presenting with the following:  Chief Complaint  Patient presents with   Headache    Headache since fall on Monday 10/07/24. Patient had a fall on 10/07/24 she's had headache and getting worse by each day. Patient also having ear aches for the last 2 days on left ear feels wet. Patient has dent where she hit her head. She also had an episode where she did not know where she was . 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 Patient also feeling nauseous.     Vitals:   10/15/24 1603  BP: 120/80  Pulse: 81  SpO2: 98%   Vitals:   10/15/24 1603  Weight: 149 lb (67.6 kg)  Height: 5' 10 (1.778 m)   Body mass index is 21.38 kg/m.  DG Chest 2 View Result Date: 09/16/2024 CLINICAL DATA:  cough, SOB, pleuritic chest pain EXAM: CHEST - 2 VIEW COMPARISON:  07/25/2016 FINDINGS: No focal airspace consolidation, pleural effusion, or pneumothorax. No cardiomegaly.No acute fracture or destructive lesion. Multilevel degenerative disc disease of the spine. Cholecystectomy clips. IMPRESSION: No acute cardiopulmonary abnormality. Electronically Signed   By: Rogelia Myers M.D.   On: 09/16/2024 10:07     Discussed the use of AI scribe software for clinical note transcription with the patient, who gave verbal consent to proceed.   Independent interpretation of notes and tests performed by another provider:   None  Procedures performed:   None  Pertinent History, Exam, Impression, and Recommendations:   History of Present Illness Toni Green is a 63 year old female with Roux-en-Y gastric bypass and reactive hypoglycemia who presents for evaluation of head trauma with brief loss of consciousness,  progressive headache, and neck pain following a fall.  Head trauma and loss of consciousness - Sustained head trauma on October 07, 2024 after losing consciousness for several seconds while descending steps, witnessed by a friend. - Awoke with headache, able to ambulate and drove home without difficulty. - No prior episodes of loss of consciousness.  Cephalalgia and scalp symptoms - Continuous and worsening headache since the fall, localized to the left side of the head. - Associated with palpable indentation, paresthesia, and 'pins and needles' sensation at the site. - No visible bleeding or fluid from the scalp. - Left eye throbbing discomfort present. - Increased sensitivity to sound, fatigue, and somnolence.  Otalgia and ear symptoms - Sensation of wetness in the left ear without evidence of blood or clear fluid. - Severe left ear pain developed yesterday, now bilateral with left-sided predominance.  Cognitive and neurobehavioral symptoms - Episode of disorientation while driving a familiar route, requiring navigation assistance. 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 - No visual disturbances, photophobia, or phonophobia during the initial drive home after the fall.  Cervical pain and musculoskeletal symptoms - Progressively worsening neck pain since the fall, with significant discomfort and muscle tightness, especially on the left. - History of cervical spine spurs; scheduled for neck injection. - Methocarbamol  available but not used for this episode.  Nutritional status and gastrointestinal symptoms - Significant weight loss and poor oral intake since treatment  for ulcers. - Recent evaluation in the emergency room for bowel obstruction. - Acknowledges poor nutritional status and possible decreased alcohol tolerance.  Physical Exam NEUROLOGICAL Cranial nerves II-XII grossly intact without focal deficit. Sensation equal and symmetric over the  forehead, cheeks, and chin. Patient reports localized tingling, numbness, and pins-and-needles sensation without objective sensory loss. Motor function intact throughout. CERVICAL SPINE INSPECTION: No deformity or step-off. PALPATION: Tenderness over the left cervical paraspinal musculature. No midline tenderness. RANGE OF MOTION: Decreased cervical rotation to the right and limited extension. Rotation to the left and flexion preserved. All cervical movements reproduce pain localizing to the left paraspinal cervical spine. NEUROLOGICAL: Upper extremity motor strength intact. No objective radicular deficit appreciated. SKIN Subtle erythema noted at the scalp without laceration, abrasion, or active bleeding. HEENT: TMs and canals bilaterally benign.  Assessment and Plan Concussion with brief loss of consciousness Moderate concussion with brief loss of consciousness, progressive headache, photophobia, fatigue, and cognitive symptoms. No focal neurological deficit. Intracranial pathology unlikely but not excluded. Gradual symptom improvement anticipated with rest and monitoring. - Ordered non-contrast head CT to rule out intracranial pathology and skull fracture. - Recommended cognitive and physical rest, avoidance of symptom-exacerbating activities including driving and exposure to loud environments. - Advised abstinence from alcohol and avoidance of smoke exposure during recovery. - Encouraged hydration and optimal nutrition. - Discussed gradual return to light physical activity as tolerated, such as walking, but advised against strenuous exercise. - Provided anticipatory guidance regarding symptom monitoring, including warning signs such as worsening headache, nocturnal headache, or new neurological deficits, warranting urgent evaluation. - Recommended omega-3 (DHA) supplementation; dosing instructions to be provided via MyChart. - Scheduled follow-up in two weeks to reassess symptoms and  recovery. - Advised to contact via MyChart or phone for interim concerns.  Cervical strain with muscle spasm Significant neck pain, restricted cervical range of motion, paraspinal muscle spasm, and tenderness, likely secondary to whiplash injury. No neurological deficit. Cervical spine spurs and upcoming neck injection not contraindicated by current symptoms. - Prescribed methocarbamol  500 mg, half to one tablet as needed for muscle spasm, with caution regarding drowsiness and unsteadiness. - Advised use of methocarbamol  primarily at home and to avoid driving while taking due to overlapping side effects with concussion symptoms. - Confirmed no contraindication to upcoming cervical injection with orthopedist; encouraged proceeding as scheduled. - Encouraged gentle activity and rest as tolerated, avoiding activities that exacerbate neck pain. - Will reassess cervical symptoms at follow-up in two weeks. - Use cervical collar when out, otherwise use neck support while at home (high seat back, etc)  Scalp contusion with localized tenderness Palpable indentation and localized tenderness at the right lateral occiput with mild erythema, without laceration or open wound. No evidence of underlying skull fracture on exam, but CT scan ordered to rule out bony injury. - Ordered non-contrast head CT to evaluate for possible skull fracture or intracranial injury. - Assessed scalp for laceration and infection; none identified. - Provided reassurance regarding low likelihood of significant skull fracture, pending imaging confirmation. - Will follow up on CT results and reassess at next visit.  Problem List Items Addressed This Visit     Cervical strain   Concussion wth loss of consciousness of 30 minutes or less   Head trauma - Primary   Relevant Orders   CT HEAD WO CONTRAST ( )     Orders & Medications Medications:  Meds ordered this encounter  Medications   methocarbamol  (ROBAXIN ) 500 MG tablet     Sig:  Take 0.5-1 tablets (250-500 mg total) by mouth every 8 (eight) hours as needed for muscle spasms.    Dispense:  30 tablet    Refill:  0   Orders Placed This Encounter  Procedures   CT HEAD WO CONTRAST ( )     No follow-ups on file.     Selinda JINNY Ku, MD, [facility]   Primary Care Sports Medicine Primary Care and Sports Medicine at [facility]   "

## 2024-10-16 ENCOUNTER — Other Ambulatory Visit: Payer: Self-pay | Admitting: Family Medicine

## 2024-10-16 ENCOUNTER — Ambulatory Visit: Payer: Self-pay | Admitting: Family Medicine

## 2024-10-16 ENCOUNTER — Ambulatory Visit
Admission: RE | Admit: 2024-10-16 | Discharge: 2024-10-16 | Disposition: A | Discharge status: Home / Self Care | Source: Ambulatory Visit | Attending: Family Medicine | Admitting: Family Medicine

## 2024-10-16 ENCOUNTER — Ambulatory Visit

## 2024-10-16 DIAGNOSIS — S0990XA Unspecified injury of head, initial encounter: Secondary | ICD-10-CM | POA: Insufficient documentation

## 2024-10-16 DIAGNOSIS — S161XXA Strain of muscle, fascia and tendon at neck level, initial encounter: Secondary | ICD-10-CM

## 2024-10-16 DIAGNOSIS — S060X1A Concussion with loss of consciousness of 30 minutes or less, initial encounter: Secondary | ICD-10-CM

## 2024-10-16 MED ORDER — TIZANIDINE HCL 2 MG PO TABS: 2.0000 mg | ORAL_TABLET | Freq: Four times a day (QID) | ORAL | 0 refills | Status: DC | PRN

## 2024-10-16 NOTE — Addendum Note (Signed)
 Addended by: Graclynn Vanantwerp J on: 10/16/2024 08:23 AM   Modules accepted: Orders

## 2024-10-16 NOTE — Progress Notes (Signed)
 Patient came in to receive brace for neck strain from her fall she had on 10/07/24.  Toni Green

## 2024-10-18 NOTE — Telephone Encounter (Signed)
 FYI  KP

## 2024-10-18 NOTE — Telephone Encounter (Signed)
 Please review.  KP

## 2024-10-21 ENCOUNTER — Other Ambulatory Visit: Payer: Self-pay | Admitting: Family Medicine

## 2024-10-21 NOTE — Telephone Encounter (Signed)
PT response

## 2024-10-22 DIAGNOSIS — S161XXA Strain of muscle, fascia and tendon at neck level, initial encounter: Secondary | ICD-10-CM

## 2024-10-22 MED ORDER — TIZANIDINE HCL 2 MG PO TABS: 2.0000 mg | ORAL_TABLET | Freq: Four times a day (QID) | ORAL | 0 refills | Status: DC | PRN

## 2024-10-28 ENCOUNTER — Other Ambulatory Visit: Payer: Self-pay | Admitting: Psychiatry

## 2024-10-28 NOTE — Telephone Encounter (Signed)
 I believe she takes topiramate  every other day now. Could you ask if she needs this refill? thanks

## 2024-10-28 NOTE — Telephone Encounter (Signed)
"  Lvm to call office     "

## 2024-10-29 NOTE — Telephone Encounter (Signed)
" °  Spoke with pharmacist per Dr.Hisada to verify if refills for the trazodone  were needed , per pharmacist 3 month supply was given she would not be due untill March spoke with pt per pt stated she just went off what her computer said and didn't check to see if she even needed the refill, no other concern at this time  "

## 2024-10-30 ENCOUNTER — Ambulatory Visit (INDEPENDENT_AMBULATORY_CARE_PROVIDER_SITE_OTHER): Admitting: Family Medicine

## 2024-10-30 ENCOUNTER — Ambulatory Visit: Admitting: Family Medicine

## 2024-10-30 ENCOUNTER — Encounter: Payer: Self-pay | Admitting: Student

## 2024-10-30 ENCOUNTER — Encounter: Payer: Self-pay | Admitting: Family Medicine

## 2024-10-30 VITALS — BP 114/80 | HR 64 | Ht 70.0 in | Wt 149.0 lb

## 2024-10-30 DIAGNOSIS — S060X1D Concussion with loss of consciousness of 30 minutes or less, subsequent encounter: Secondary | ICD-10-CM | POA: Diagnosis not present

## 2024-10-30 DIAGNOSIS — M4722 Other spondylosis with radiculopathy, cervical region: Secondary | ICD-10-CM | POA: Diagnosis not present

## 2024-10-30 DIAGNOSIS — H5712 Ocular pain, left eye: Secondary | ICD-10-CM | POA: Insufficient documentation

## 2024-10-30 DIAGNOSIS — R55 Syncope and collapse: Secondary | ICD-10-CM | POA: Diagnosis not present

## 2024-10-30 DIAGNOSIS — F322 Major depressive disorder, single episode, severe without psychotic features: Secondary | ICD-10-CM

## 2024-10-30 MED ORDER — CYCLOBENZAPRINE HCL 10 MG PO TABS: 10.0000 mg | ORAL_TABLET | Freq: Three times a day (TID) | ORAL | 0 refills | Status: DC | PRN

## 2024-10-30 NOTE — Progress Notes (Signed)
 "    Primary Care / Sports Medicine Office Visit  Patient Information:  [name], female DOB: 1961/07/22 Age: 64 y.o. MRN: [id number]   Toni Green is a pleasant 64 y.o. female presenting with the following:  Chief Complaint  Patient presents with   Concussion    Follow up concussion symptoms 10/22   severity 42/132.     Vitals:   10/30/24 1549  BP: 114/80  Pulse: 64  SpO2: 98%   Vitals:   10/30/24 1549  Weight: 149 lb (67.6 kg)  Height: 5' 10 (1.778 m)   Body mass index is 21.38 kg/m.  DG Cervical Spine Complete Result Date: 10/16/2024 CLINICAL DATA:  Neck strain after injury EXAM: CERVICAL SPINE - COMPLETE 4+ VIEW COMPARISON:  August 26, 2024 FINDINGS: Stable minimal grade 1 retrolisthesis of C3-4 most likely due to degenerative change. Stable minimal grade 1 retrolisthesis of C5-6 is noted most likely due to mild degenerative disc disease at this level. Mild degenerative disc disease is also noted at C6-7. Mild to moderate bilateral neural foraminal stenosis is noted at C5-6 and C6-7 secondary to uncovertebral spurring. No definite fracture is noted. IMPRESSION: Multilevel degenerative disc disease as described above. No acute abnormality seen. Electronically Signed   By: Lynwood Landy Raddle M.D.   On: 10/16/2024 09:58   CT HEAD WO CONTRAST ( ) Result Date: 10/16/2024 EXAM: CT HEAD WITHOUT CONTRAST 10/16/2024 09:21:44 AM TECHNIQUE: CT of the head was performed without the administration of intravenous contrast. Automated exposure control, iterative reconstruction, and/or weight based adjustment of the mA/kV was utilized to reduce the radiation dose to as low as reasonably achievable. COMPARISON: None available. CLINICAL HISTORY: Facial trauma, blunt; Fall 12/22, progressive symptoms. Confusion and headache. FINDINGS: BRAIN AND VENTRICLES: There is no evidence of an acute infarct, intracranial hemorrhage, mass, midline shift, hydrocephalus, or extra-axial  fluid collection. Cerebral volume is normal. Calcified atherosclerosis at the skull base. ORBITS: No acute abnormality. SINUSES: Partially visualized mild mucosal thickening in the right maxillary sinus. Clear mastoid air cells. SOFT TISSUES AND SKULL: No acute soft tissue abnormality. No skull fracture. IMPRESSION: 1. No acute intracranial abnormality. Electronically signed by: Dasie Hamburg MD 10/16/2024 09:27 AM EST RP Workstation: [id number]     Independent interpretation of notes and tests performed by another provider:   None  Procedures performed:   None  Pertinent History, Exam, Impression, and Recommendations:   Discussed the use of AI scribe software for clinical note transcription with the patient, who gave verbal consent to proceed.  History of Present Illness   Toni Green is a 64 year old female with chronic cervical spondylosis with radiculopathy and persistent post-concussive symptoms who presents for follow-up of ongoing neck pain and neurological symptoms.  Post-Concussive Symptoms: - Persistent symptoms following a prior fall, with gradual but modest improvement - Symptom count decreased from twelve to ten out of twenty-two; cumulative severity improved from forty-five to forty-two - Ongoing symptoms: pronounced noise sensitivity (severe, requiring frequent reduction of ambient noise and retreat to quieter environments), blurred vision, cognitive slowing, difficulty with concentration, low energy, confusion, drowsiness, and irritability - Not currently participating in vestibular or concussion-specific rehabilitation - Mood is more irritable, attributed in part to fatigue from muscle relaxants - Poor sleep quality due to pain and discomfort, despite use of trazodone  at night  Cervical Radiculopathy and Neck Pain: - Chronic neck pain with constant tightness and stiffness - Sensation of head feeling too heavy for her neck - History  of bone spurs and prior imaging  showing cervical spine changes - Numbness radiating from left arm into three fingers - Scheduled for neck injection in two days - Previous treatments: muscle relaxants (methocarbamol , tizanidine ), tramadol, neck brace, with minimal relief - Has taken up to four methocarbamol  at once without significant benefit - Sleeps on her stomach, which may worsen neck symptoms - Frustration with lack of relief and poor sleep due to pain  Syncope: - Experienced a syncopal episode with loss of consciousness - No prodrome or warning symptoms - Distinguishes this event from prior hypoglycemic episodes (no sensation of dropping blood sugar) - Recalled standing one moment and then being on the deck the next, with no memory of the fall  Ocular Symptoms: - Ongoing left eye symptoms: chronic blurred vision and persistent gritty sensation, sometimes with pain - Sharp pain episode in left eye approximately three weeks ago, after which pain lessened but blurriness and gritty sensation persisted - Symptoms present since before concussion - Evaluated by optometrist today; no abnormalities or corneal abrasion found - No eye drops prescribed       Assessment and Plan    Concussion Persistent post-concussive symptoms with slow recovery due to exposure to aggravating stimuli. Emphasized avoidance of noxious stimuli for brain recovery. - Advised strict avoidance of noxious stimuli, particularly noise. - Recommended foam ear plugs or noise-canceling headphones. - Discussed vestibular rehabilitation if symptoms persist or plateau. - Instructed to contact provider if symptoms do not improve or become refractory.  Cervical spondylosis with radiculopathy Chronic multilevel cervical spondylosis with radiculopathy, refractory to current medications. Imaging shows disc space narrowing and foraminal narrowing consistent with nerve impingement. Aggressive management with steroid injections indicated. - Reinforced  importance of steroid injections for inflammation control. - Advised continuation of current medications for temporary relief. - Recommended cervical brace for support. - Ordered referral to physical therapy post-injection improvement. - Discussed vetted physical therapy facilities with DPT. - Prescribed cyclobenzaprine  10 mg at night as needed. - Discontinued methocarbamol  due to inefficacy. - Advised scheduling follow-up injection if needed. - Instructed to avoid stomach sleeping.  Mood disturbance secondary to concussion Irritability and low energy likely due to concussion and muscle relaxants. Mood changes are typically reversible. - Advised discussing medication adjustments with psychiatrist if low mood or energy persists. - Reviewed current psychiatric medications and dosing. - Provided guidance on potential medication strategies, deferring decisions to psychiatrist. - Reassured mood changes are reversible and medication increases can be tapered post-recovery.  Left eye pain and blurred vision Chronic left eye pain and blurred vision, likely neurological given normal eye exam and concurrent neurological complaints. - Ordered referral to neurology for evaluation of visual symptoms. - Instructed to message provider if neurology evaluation is unrevealing for ophthalmology referral.  Syncope Syncopal episode without prodrome or hypoglycemic symptoms. Differential includes neurogenic causes possibly related to cervical spondylosis. - Ordered referral to neurology for further evaluation, including possible imaging and vascular studies of the neck.       A total of 42 minutes was spent on the date of service, 10/30/2024, encompassing both face-to-face and non-face-to-face time. This included review of prior records and imaging (e.g., MRI and/or radiographs), medical chart review, information gathering, documentation, care coordination with clinic staff, discussion and counseling with the  patient regarding clinical findings and treatment options, and planning for follow-up and next steps in management.  Assessment & Plan Concussion with loss of consciousness of 30 minutes or less, subsequent encounter  Orders:   Ambulatory referral  to Neurology  Cervical spondylosis with radiculopathy  Orders:   Ambulatory referral to Physical Therapy  Moderately severe major depression (HCC)     Syncope, unspecified syncope type  Orders:   Ambulatory referral to Neurology  Left eye pain  Orders:   Ambulatory referral to Neurology   No follow-ups on file.     Selinda JINNY Ku, MD, [facility]   Primary Care Sports Medicine Primary Care and Sports Medicine at [facility]   "

## 2024-10-30 NOTE — Assessment & Plan Note (Signed)
 Orders:    Ambulatory referral to Physical Therapy

## 2024-10-30 NOTE — Assessment & Plan Note (Signed)
 SABRA

## 2024-10-30 NOTE — Patient Instructions (Signed)
" °  VISIT SUMMARY: During your visit, we discussed your ongoing neck pain, post-concussive symptoms, and other related issues. We reviewed your current treatments and made adjustments to better manage your symptoms.  YOUR PLAN: POST-CONCUSSIVE SYMPTOMS: You have persistent symptoms following a prior fall, with some gradual improvement. -Strictly avoid noxious stimuli, especially noise. -Use foam ear plugs or noise-canceling headphones. -Consider vestibular rehabilitation if symptoms persist or plateau. -Contact us  if symptoms do not improve or become worse.  CERVICAL SPONDYLOSIS WITH RADICULOPATHY: You have chronic neck pain and nerve-related symptoms that have not responded well to current medications. -Continue with your current medications for temporary relief. -Use a cervical brace for support. -Proceed with the scheduled steroid injection for inflammation control. -Follow up with physical therapy after the injection. -Take cyclobenzaprine  10 mg at night as needed. -Stop taking methocarbamol . -Avoid sleeping on your stomach.  MOOD DISTURBANCE SECONDARY TO CONCUSSION: Your irritability and low energy are likely due to the concussion and muscle relaxants. -Discuss medication adjustments with your psychiatrist if low mood or energy persists. -Review your current psychiatric medications and dosing with your psychiatrist. -Mood changes are reversible and medication increases can be tapered post-recovery.  LEFT EYE PAIN AND BLURRED VISION: You have chronic left eye pain and blurred vision, likely neurological in nature. -Referral to neurology for evaluation of visual symptoms. -Contact us  if the neurology evaluation does not provide answers for an ophthalmology referral.  SYNCOPE: You experienced a syncopal episode without warning symptoms, possibly related to cervical spondylosis. -Referral to neurology for further evaluation, including possible imaging and vascular studies of the  neck.                      Contains text generated by [employer].                                 Contains text generated by [employer].   "

## 2024-10-30 NOTE — Assessment & Plan Note (Signed)
 Orders:    Ambulatory referral to Neurology

## 2024-10-31 ENCOUNTER — Other Ambulatory Visit: Payer: Self-pay

## 2024-10-31 DIAGNOSIS — E033 Postinfectious hypothyroidism: Secondary | ICD-10-CM

## 2024-10-31 MED ORDER — LEVOTHYROXINE SODIUM 75 MCG PO TABS: 75.0000 ug | ORAL_TABLET | Freq: Every day | ORAL | 0 refills | Status: AC

## 2024-11-01 ENCOUNTER — Ambulatory Visit: Admitting: Family Medicine

## 2024-11-04 ENCOUNTER — Ambulatory Visit: Admitting: Neurology

## 2024-11-04 ENCOUNTER — Encounter: Payer: Self-pay | Admitting: Neurology

## 2024-11-04 VITALS — BP 128/89 | HR 75 | Ht 70.0 in | Wt 154.6 lb

## 2024-11-04 DIAGNOSIS — E0849 Diabetes mellitus due to underlying condition with other diabetic neurological complication: Secondary | ICD-10-CM | POA: Insufficient documentation

## 2024-11-04 DIAGNOSIS — S060X1A Concussion with loss of consciousness of 30 minutes or less, initial encounter: Secondary | ICD-10-CM

## 2024-11-04 DIAGNOSIS — G44309 Post-traumatic headache, unspecified, not intractable: Secondary | ICD-10-CM | POA: Diagnosis not present

## 2024-11-04 MED ORDER — B-12 3000 MCG SL SUBL: 3000.0000 ug | SUBLINGUAL_TABLET | Freq: Every day | SUBLINGUAL | 5 refills | Status: AC

## 2024-11-04 NOTE — Progress Notes (Signed)
 [facility]  NEW Patient   Provider:  Dedra Gores, MD  Referring Provider: Justus Leita DEL, MD Primary Care Physician:  Justus Leita DEL, MD  Chief Complaint  Patient presents with   New Patient (Initial Visit)    Patient in room 1 with husband  Patient stated she blacked out around [place] and got a concussion. Patient stated that her left eye had been blurry and painful before the syncope. Patient stated she feels like theres something in her eye and that she is currently having blurry vision.      HPI:  Toni Green is a 64 y.o. female and seen here on 11/04/2024 upon referral from Dr. Justus for a Consultation/ Evaluation of  syncope / fall/ concussion.   Sunday the 12-21 she had been in [place] to a madrigal, and there she got a sharp pain in her left eye, severe , sharp , stabbing pain. Lasted seconds and went away and was noting blurred vision.  She sleeps usually poorly, but that night  she was sleepy.  Followed by  10-07-2024, Monday.: She had been babysitting a 4 months old relative, went to her home at 6 PM, she had noted again a temple located bilateral headache,  Went to the neighbors and had 2 -3 glasses of wine - did not feel impaired. She was ready to leave at 11 Pm and went to the door and stepped out, wake up on he deck, had a sore spot  on the occipital scalp.  Days later she developed headaches, left sided.   The left eye felt irritated, blurred, nauseated - and she had these for weeks prior. She lost 30 pounds since September due to nausea, ulcers.  She did not go to the ED, but went to Sports Medicine Doc in [facility] for concussion-  1 week after the fall.  She is phonophobic.  She has had neck pain for months.  She has not been diagnosed officially, she had HA in her 30s.  Not in her 51 and 58s.  Stereotypical headache course described:  Headache frequency:  DAILY:  every day before Christmas,  she had cluster or icepick headaches -9/ 10  intensity , eye tearing and all. Since the trauma-  2 times. more phonophobia and not daily headaches. 3/ 10 , base of the neck pain.   Failed abortive therapies: She has been given muscle relaxants by sports MD.  Neck injection was done yesterday.   Failed preventive therapies:  none used, PT referral.    History of TBI, injuries, radiation, infection: see above.   Family history of migraine or other headaches:  None in her family.    Historyof bariatric surgery in 2008,  she lost 130 pounds afterwards.   Review of Systems: Out of a complete 14 system review, the patient complains of only the following symptoms, and all other reviewed systems are negative.    Social History   Socioeconomic History   Marital status: Married    Spouse name: Not on file   Number of children: Not on file   Years of education: Not on file   Highest education level: Associate degree: occupational, scientist, product/process development, or vocational program  Occupational History   Not on file  Tobacco Use   Smoking status: Never   Smokeless tobacco: Never  Vaping Use   Vaping status: Never Used  Substance and Sexual Activity   Alcohol use: Yes    Alcohol/week: 0.0 standard drinks of alcohol    Comment:  RARE   Drug use: No   Sexual activity: Not Currently  Other Topics Concern   Not on file  Social History Narrative   Patient lives at home with husband    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   Tobacco Use: Low Risk (11/04/2024)   Patient History    Smoking Tobacco Use: Never    Smokeless Tobacco Use: Never    Passive Exposure: Not on file  Financial Resource Strain: Low Risk  (08/23/2024)   Received from [facility]   Overall Financial Resource Strain (CARDIA)    Difficulty of Paying Living Expenses: Not hard at all  Food Insecurity: Low Risk (10/03/2024)   Received from [facility]   Epic    Within the past 12 months, you worried that your food would run out before you got money  to buy more: Never true    Within the past 12 months, the food you bought just didn't last and you didn't have money to get more. : Never true  Transportation Needs: No Transportation Needs (10/03/2024)   Received from [facility]    In the past 12 months, has lack of reliable transportation kept you from medical appointments, meetings, work or from getting things needed for daily living? : No  Physical Activity: Unknown (07/04/2023)   Exercise Vital Sign    Days of Exercise per Week: 0 days    Minutes of Exercise per Session: Not on file  Stress: Stress Concern Present (07/04/2023)   [employer] of Occupational Health - Occupational Stress Questionnaire    Feeling of Stress : Very much  Social Connections: Moderately Integrated (07/04/2023)   Social Connection and Isolation Panel    Frequency of Communication with Friends and Family: Three times a week    Frequency of Social Gatherings with Friends and Family: Never    Attends Religious Services: Never    Database Administrator or Organizations: Yes    Attends Banker Meetings: Never    Marital Status: Married  Catering Manager Violence: Not At Risk (06/22/2022)   Humiliation, Afraid, Rape, and Kick questionnaire    Fear of Current or Ex-Partner: No    Emotionally Abused: No    Physically Abused: No    Sexually Abused: No  Depression (PHQ2-9): Medium Risk (09/16/2024)   Depression (PHQ2-9)    PHQ-2 Score: 6  Alcohol Screen: Medium Risk (07/04/2023)   Alcohol Screen    Last Alcohol Screening Score (AUDIT): 9  Housing: Low Risk (10/03/2024)   Received from [facility]   Epic    What is your living situation today?: I have a steady place to live    Think about the place you live. Do you have problems with any of the following? Choose all that apply:: None/None on this list  Utilities: Low Risk (10/03/2024)   Received from [facility]   Utilities    In the past 12 months has the electric, gas,  oil, or water  company threatened to shut off services in your home? 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   Bursitis    hips   Bursitis, trochanteric 10/01/2014   Cancer (HCC) 2017   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   Claustrophobia    Complication of surgery 02/20/2015   Overview:  Dilated pouch found on EGD done 12/2014    Depression    History of colostomy reversal 11/2022   History of kidney stones    Hypertension    Hypoglycemia    Hypothyroidism    Inflammation of sacroiliac joint 08/22/2014   Pneumonia 03/2020   PONV (postoperative nausea and vomiting)    NASUEATED/BAD HEADACHES   Seizures (HCC) 1985   x1, after labor/childbirth   Thyroid disease    Wears glasses     Past Surgical History:  Procedure Laterality Date   APPENDECTOMY  2010   BACK SURGERY  10/2005   lumbar   COLONOSCOPY WITH PROPOFOL  N/A 09/23/2016   Procedure: COLONOSCOPY WITH PROPOFOL ;  Surgeon: Rogelia Copping, MD;  Location: [facility];  Service: Endoscopy;  Laterality: N/A;   COLOSTOMY  10/22/2021   CYSTOSCOPY     x 1   EVALUATION UNDER ANESTHESIA WITH HEMORRHOIDECTOMY N/A 07/06/2020   Procedure: EXAM UNDER ANESTHESIA WITH HEMORRHOIDECTOMY;  Surgeon: Marolyn Nest, MD;  Location: [facility];  Service: General;  Laterality: N/A;   GALLBLADDER SURGERY  2009   lap   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 1 kit 0   busPIRone  (BUSPAR ) 7.5 MG tablet Take 1 tablet (7.5 mg total) by mouth 2 (two) times daily. 180 tablet 1   cetirizine (ZYRTEC) 10 MG tablet Take 10 mg by mouth daily.     cyclobenzaprine  (FLEXERIL ) 10 MG tablet Take 1 tablet (10 mg total) by mouth 3 (three) times daily as needed for muscle spasms. 30 tablet 0   desvenlafaxine  (PRISTIQ ) 100 MG 24 hr tablet Take 1 tablet (100 mg total) by mouth daily. 90 tablet 0   fluticasone  (FLONASE ) 50 MCG/ACT nasal spray Place 2 sprays into both nostrils daily. 16 g 6   furosemide  (LASIX ) 20 MG tablet TAKE 1 TABLET DAILY (Patient taking differently: Take 20 mg by mouth daily as needed (fluid retention.).) 90 tablet 3   glucose blood test strip Use as instructed qid 100 each 12   irbesartan  (AVAPRO ) 150 MG tablet Take 1 tablet (150 mg total) by mouth daily. 90 tablet 1   levothyroxine  (SYNTHROID ) 75 MCG tablet Take 1 tablet (75 mcg total) by mouth daily before breakfast. 90 tablet 0   ondansetron  (ZOFRAN ) 8 MG tablet Take 8 mg by mouth every 8 (eight) hours as needed for nausea or vomiting.     ondansetron  (ZOFRAN -ODT) 4 MG disintegrating tablet Take 1 tablet (4 mg total)  by mouth every 8 (eight) hours as needed for nausea or vomiting. 20 tablet 0   OneTouch Delica Lancets 30G MISC 1 each by Does not apply route 4 (four) times daily as needed. 100 each 12   propranolol  (INDERAL ) 20 MG tablet Take 1 tablet (20 mg total) by mouth daily as needed. for anxiety 90 tablet 0   rOPINIRole  (REQUIP ) 4 MG  tablet Take 1 tablet (4 mg total) by mouth at bedtime. 90 tablet 2   tamsulosin  (FLOMAX ) 0.4 MG CAPS capsule Take 1 capsule (0.4 mg total) by mouth daily as needed. (Patient taking differently: Take 0.4 mg by mouth daily as needed (Kidney stones).) 30 capsule 2   topiramate  (TOPAMAX ) 200 MG tablet Take 1 tablet (200 mg total) by mouth at bedtime. 90 tablet 0   traMADol (ULTRAM) 50 MG tablet Take 25-50 mg by mouth 2 (two) times daily as needed (back pain.).      traZODone  (DESYREL ) 50 MG tablet Take 0.5-1 tablets (25-50 mg total) by mouth at bedtime. 90 tablet 0   valACYclovir (VALTREX) 1000 MG tablet as needed.     desvenlafaxine  (PRISTIQ ) 100 MG 24 hr tablet Take 1 tablet (100 mg total) by mouth daily. Start after completing 50 mg daily for 7 days (Patient not taking: Reported on 11/04/2024) 30 tablet 0   famotidine  (PEPCID ) 40 MG tablet Take by mouth. (Patient not taking: Reported on 11/04/2024)     ibuprofen  (ADVIL ) 800 MG tablet Take 1 tablet (800 mg total) by mouth every 8 (eight) hours as needed. (Patient not taking: Reported on 11/04/2024) 30 tablet 0   L-Methylfolate 15 MG TABS Take 1 tablet (15 mg total) by mouth daily. (Patient not taking: Reported on 11/04/2024) 90 tablet 0   No current facility-administered medications for this visit.    Allergies as of 11/04/2024 - Review Complete 11/04/2024  Allergen Reaction Noted   Losartan Palpitations 04/23/2015   Penicillins Rash 03/26/2015   Tape Rash 09/19/2016    Vitals: BP 128/89 (Patient Position: Standing)   Pulse 75   Ht 5' 10 (1.778 m)   Wt 154 lb 9.6 oz (70.1 kg)   BMI 22.18 kg/m   Psychiatrist treats her Insomnia, and FP treats RLS.   Normal orthostatics History of hypoglycemia. SABRA    Physical exam:  General: The patient is awake, alert and appears not in acute distress.  The patient is well groomed. Head: Normocephalic, atraumatic.  Neck is supple.  Neck circumference:13  Cardiovascular:  Regular rate and palpable  peripheral pulse:  Respiratory: clear to auscultation.  Skin:  With evidence of ankle  edema, dry skin.   Trunk:  She is slender.    Neurologic exam : The patient is awake and alert, oriented to place and time.   Memory subjective  described as intact.  There is a normal attention span & concentration ability.  Speech is fluent without  dysarthria, dysphonia or aphasia.  Mood and affect are anxious, a little difficulty with time frame of events.   Cranial nerves: Pupils are equal and briskly reactive to light.  Funduscopic exam evidence of pallor or edema. Beginning cataract  Extraocular movements  in vertical and horizontal planes intact and without nystagmus. Visual fields by finger perimetry are intact. Hearing to finger rub intact.  Facial sensation intact to fine touch.  Facial motor strength is symmetric and tongue and uvula move midline.  Motor exam:   Normal tone and normal muscle bulk and symmetric normal strength in all extremities.  Grip Strength equal  Proximal strength of shoulder muscles and hip flexors was intact   Sensory:  Fine touch and vibration were tested .  Proprioception was tested in the upper extremities only and was  normal.  Coordination: Rapid alternating movements in the fingers/hands were normal.  Finger-to-nose maneuver was tested and showed no evidence of ataxia, dysmetria or tremor.  Gait and station: Patient walked without assistive device .  Core Strength within normal limits.  Stance is stable and of normal base.  Arm swing intact  Tandem gait is intact, turns with 3 Steps are unfragmented.  Romberg testing ; mild sway   Deep tendon reflexes: in the  upper and lower extremities are symmetric Babinski maneuver response is  downgoing.    CT 10-16-2024 : CT   CLINICAL HISTORY: Facial trauma, blunt; Fall 12/22, progressive symptoms. Confusion and headache.   FINDINGS:   BRAIN AND VENTRICLES: There is no evidence of an acute infarct,  intracranial hemorrhage, mass, midline shift, hydrocephalus, or extra-axial fluid collection. Cerebral volume is normal. Calcified atherosclerosis at the skull base.   ORBITS: No acute abnormality.   SINUSES: Partially visualized mild mucosal thickening in the right maxillary sinus. Clear mastoid air cells.   SOFT TISSUES AND SKULL: No acute soft tissue abnormality. No skull fracture.   IMPRESSION: 1. No acute intracranial abnormality.   Electronically signed by: Dasie [place] MD 10/16/2024 09:27 AM EST RP Workstation: [id number]   Assessment: Total time for face to face interview and examination, for review of  images and laboratory testing, neurophysiology testing and pre-existing records, including out-of -network , was 45 minutes. 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   1)   Headaches  new onset- re onset about 12 weeks ago,  culminating in a syncope spell on 12-22. LOC and hit her head , new type of headaches since the trauma, concussion.  Phonophobia.   2) status post bariatric surgery with  risk for low B 12,  see March 2025 test, normal TSH,  needs to restart bariatric vitamins.   3)  status post gastric bypass, Roux and Y/ : don't drink  alcohol on an a empty stomach  without food intake.  Do not exceed a glass and drink slowly.     Plan:  Treatment plan and additional workup planned after today includes:    Start sublingual vit b 12 .   MRI brain.   EEG   She is sleepier than she was 4 weeks ago.  Post concussion treatment is by time and sleep and relief of pain.      The patient's condition requires frequent monitoring and adjustments in the treatment plan, reflecting the ongoing complexity of care.  This provider is the continuing focal point for all needed services for this condition.   Dedra Gores, MD  [facility] Board certified by The [employer] of Sleep Medicine and Diplomate of the [employer] of Sleep Medicine. Board certified In Neurology through the ABPN, Fellow of the [employer] of Neu[employer].

## 2024-11-04 NOTE — Patient Instructions (Signed)
 Post-Concussion Syndrome  A concussion is a brain injury from a direct hit to the head or body. This hit causes the brain to shake back and forth fast inside the skull. The shaking can damage brain cells and cause chemical changes in the brain. Concussions are normally not life-threatening but can cause serious symptoms. Post-concussion syndrome is when symptoms that happen after a concussion last longer than normal. These symptoms can last from weeks to months. What are the causes? The cause of this condition is not known. It can happen whether your head injury was mild or severe. What increases the risk? You are more likely to get this condition if: You are female. You are a child, teen, or young adult. You have had a head injury before. You have a history of headaches. You have depression or anxiety. You have more than one symptom or severe symptoms when your concussion occurs. You faint or cannot remember the event (have amnesia of the event). What are the signs or symptoms? Symptoms of this condition include: Physical symptoms, such as: Headaches. Tiredness. Dizziness and weakness. Blurred vision and sensitivity to light. Trouble hearing. Problems with balance. Mental and emotional symptoms, such as: Memory problems and trouble focusing. Trouble falling asleep or staying asleep (insomnia). Feeling irritable. Anxiety or depression. Trouble learning new things. How is this diagnosed? This condition may be diagnosed based on: Your symptoms. A description of your injury. Your medical history. Testing your strength, balance, and nerve function (neurological exam). Your health care provider may order other tests. These may include: Brain imaging, such as a CT scan or an MRI. Memory testing (neuropsychological testing). How is this treated? Treatment for this condition may depend on your symptoms. Symptoms normally go away on their own with time. If you need treatment, it may  include: Medicines for headaches, anxiety, depression, and insomnia. Resting your brain and body for a few days after your injury. Rehab therapy, such as: Physical or occupational therapy. This may include exercises to help with balance and dizziness. Mental health counseling. A form of talk therapy called cognitive behavioral therapy (CBT) can be especially helpful. This therapy helps you set goals and follow up on the changes that you make. Speech therapy. Vision therapy. A brain and eye specialist can recommend treatments for vision problems. Follow these instructions at home: Medicines Take over-the-counter and prescription medicines only as told by your health care provider. Avoid opioid prescription pain medicines when getting over a concussion. Activity Limit your mental activities for the first few days after your injury. This may include not doing these things: Homework or work for your job. Complex thinking. Watching TV. Using a computer or phone. Playing memory games and puzzles. Slowly return to your normal activity level. If a certain activity brings on your symptoms, stop or slow down until you can do the activity without getting symptoms again. Limit physical activity, such as sports or strenuous activities, for the first few days after a concussion. Slowly return to normal activity as told by your health care provider. Light exercise may be helpful. Rest helps your brain heal. Make sure you: Get plenty of sleep at night. Most adults should get at least 7-9 hours of sleep each night. Rest during the day. Take naps or rest breaks when you feel tired. Do not do high-risk activities that could cause a second concussion, such as riding a bike or playing sports. Having another concussion before the first one has healed can be harmful. General instructions Do not  drink alcohol until your health care provider says that you can. Keep track of how severe your symptoms are and how  often they happen. Give this information to your health care provider. Keep all follow-up visits. Your health care provider may need to check you for new or serious symptoms. Where to find more information Conc[employer] Foundation: concussionfoundation.org Contact a health care provider if: Your symptoms do not improve. You get injured again. You have unusual behavior changes. Get help right away if: You have a severe or worsening headache. You have weakness or numbness in any part of your body. You vomit repeatedly. You have mental status changes, such as: Confusion. Trouble speaking. Trouble staying awake. Fainting. You have a seizure. These symptoms may be an emergency. Get help right away. Call 911. Do not wait to see if the symptoms will go away. Do not drive yourself to the hospital. Also, get help right away if: You think about hurting yourself or others. Take one of these steps if you feel like you may hurt yourself or others, or have thoughts about taking your own life: Go to your nearest emergency room. Call 911. Call the National Suicide Prevention Lifeline at 859 482 7520 or 988. This is open 24 hours a day. Text the Crisis Text Line at (304) 821-6707. This information is not intended to replace advice given to you by your health care provider. Make sure you discuss any questions you have with your health care provider. Document Revised: 02/01/2024 Document Reviewed: 02/25/2022 Elsevier Patient Education  2025 [employer].

## 2024-11-05 ENCOUNTER — Telehealth: Payer: Self-pay | Admitting: Neurology

## 2024-11-05 NOTE — Telephone Encounter (Signed)
MRI order sent to [facility] [phone]

## 2024-11-08 ENCOUNTER — Other Ambulatory Visit: Payer: Self-pay | Admitting: Family Medicine

## 2024-11-08 NOTE — Telephone Encounter (Signed)
 Requested medications are due for refill today.  yes  Requested medications are on the active medications list.  yes  Last refill. 10/30/2024 #30 0 rf  Future visit scheduled.   yes  Notes to clinic.  Refill not delegated.    Requested Prescriptions  Pending Prescriptions Disp Refills   cyclobenzaprine  (FLEXERIL ) 10 MG tablet [Pharmacy Med Name: Cyclobenzaprine  HCl 10 MG Oral Tablet] 30 tablet 0    Sig: Take 1 tablet by mouth three times daily as needed for muscle spasm     Not Delegated - Analgesics:  Muscle Relaxants Failed - 11/08/2024 12:52 PM      Failed - This refill cannot be delegated      Passed - Valid encounter within last 6 months    Recent Outpatient Visits           1 week ago Concussion with loss of consciousness of 30 minutes or less, subsequent encounter   [facility], Selinda PARAS, MD   3 weeks ago Traumatic injury of head, initial encounter   [facility], Selinda PARAS, MD   1 month ago SOB (shortness of breath)   [facility] [facility], Leita DEL, MD   4 months ago Essential (primary) hypertension   [place] [facility], Leita DEL, MD   7 months ago Severe major depression without psychotic features [facility])   [place] [facility] at [facility], Leita DEL, MD

## 2024-11-12 ENCOUNTER — Other Ambulatory Visit: Payer: Self-pay | Admitting: Family Medicine

## 2024-11-12 ENCOUNTER — Telehealth: Payer: Self-pay | Admitting: *Deleted

## 2024-11-12 ENCOUNTER — Other Ambulatory Visit: Admitting: *Deleted

## 2024-11-12 MED ORDER — CYCLOBENZAPRINE HCL 10 MG PO TABS: 10.0000 mg | ORAL_TABLET | Freq: Three times a day (TID) | ORAL | 0 refills | Status: AC | PRN

## 2024-11-12 NOTE — Telephone Encounter (Signed)
 Please review.  KP

## 2024-11-13 ENCOUNTER — Other Ambulatory Visit: Payer: Self-pay

## 2024-11-13 DIAGNOSIS — M25512 Pain in left shoulder: Secondary | ICD-10-CM

## 2024-11-15 ENCOUNTER — Ambulatory Visit: Admitting: Family Medicine

## 2024-11-17 NOTE — Progress Notes (Unsigned)
 Virtual Visit via Video Note  I connected with Toni Green on 11/22/24 at 10:30 AM EST by a video enabled telemedicine application and verified that I am speaking with the correct person using two identifiers.  Location: Patient: home Provider: home office Persons participated in the visit- patient, provider    I discussed the limitations of evaluation and management by telemedicine and the availability of in person appointments. The patient expressed understanding and agreed to proceed    I discussed the assessment and treatment plan with the patient. The patient was provided an opportunity to ask questions and all were answered. The patient agreed with the plan and demonstrated an understanding of the instructions.   The patient was advised to call back or seek an in-person evaluation if the symptoms worsen or if the condition fails to improve as anticipated.   Toni Sleet, MD    [name] MD/PA/NP OP Progress Note  11/22/2024 12:12 PM BENTLEIGH WAREN  MRN:  [id number]  Chief Complaint:  Chief Complaint  Patient presents with   Follow-up   HPI:  This is a follow-up appointment for depression, anxiety, insomnia and binge eating.  She states that she feels much better.  She is crying a lot less.  She has been enjoying cleaning and cooking.  She has make-up, dress up, and had her haircut.  She feels Pristiq  has been working very well.  She had an episode of blackout after visiting her friend, and drank some alcohol.  She hit her head and had a concussion.  She reports worsening in her neck pain, while she was supposed to have intervention for this.  Her headache has been better.  She was started on vitamin B12 by neurologist, and she finds it very helpful for fatigue.  Although she used to have no energy, it has been better.  She also has a new mattress, which has been helping for sleep.  She reports taking a little more propranolol  when her mother stayed at her place.  She states that  her mother does not compromise and she was stressed.  She reports improvement in appetite/p.o. intake since her medication was started for ulcer.  She denies SI, hallucinations.  She agrees with the plans as outlined.   Wt Readings from Last 3 Encounters:  11/04/24 154 lb 9.6 oz (70.1 kg)  10/30/24 149 lb (67.6 kg)  10/15/24 149 lb (67.6 kg)      Visit Diagnosis:    ICD-10-CM   1. Major depressive disorder, recurrent episode, moderate (HCC)  F33.1     2. Anxiety state  F41.1     3. Insomnia, unspecified type  G47.00     4. Binge eating disorder, unspecified severity  F50.819     5. Iron deficiency  E61.1 CBC    Iron, TIBC and Ferritin Panel    6. Encounter for vitamin deficiency screening  Z13.21 CBC    Iron, TIBC and Ferritin Panel    VITAMIN D  25 Hydroxy (Vit-D Deficiency, Fractures)    Vitamin B12      Past Psychiatric History: Please see initial evaluation for full details. I have reviewed the history. No updates at this time.     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   Claustrophobia    Complication of surgery 02/20/2015   Overview:  Dilated pouch found on EGD done 12/2014    Depression    History of colostomy reversal 11/2022   History of kidney stones    Hypertension    Hypoglycemia    Hypothyroidism    Inflammation of sacroiliac joint 08/22/2014   Pneumonia 03/2020   PONV (postoperative nausea and vomiting)    NASUEATED/BAD HEADACHES   Seizures (HCC) 1985   x1, after labor/childbirth   Thyroid disease    Wears glasses     Past Surgical History:  Procedure Laterality Date   APPENDECTOMY  2010   BACK SURGERY  10/2005   lumbar   COLONOSCOPY WITH PROPOFOL  N/A 09/23/2016   Procedure: COLONOSCOPY WITH PROPOFOL ;  Surgeon: Rogelia Copping, MD;  Location: [facility];  Service: Endoscopy;  Laterality: N/A;   COLOSTOMY  10/22/2021   CYSTOSCOPY     x 1   EVALUATION UNDER ANESTHESIA WITH HEMORRHOIDECTOMY N/A 07/06/2020   Procedure: EXAM UNDER ANESTHESIA WITH HEMORRHOIDECTOMY;  Surgeon: Marolyn Nest, MD;  Location: [facility];  Service: General;  Laterality: N/A;   GALLBLADDER SURGERY  2009   lap   GASTRIC BYPASS  2008   roux en y   MUCOSAL ADVANCEMENT FLAP N/A 12/10/2020   Procedure: MUCOSAL ADVANCEMENT FLAP;  Surgeon: Debby Hila, MD;  Location: [facility];  Service: General;  Laterality: N/A;  60 MIN   POLYPECTOMY  09/23/2016   Procedure: POLYPECTOMY;  Surgeon: Rogelia Copping, MD;  Location: [facility];  Service: Endoscopy;;   TOTAL VAGINAL HYSTERECTOMY  1999    Family Psychiatric History: Please see initial evaluation for full details. I have reviewed the history. No updates at this time.     Family History:  Family History  Problem Relation Age of Onset   Depression Mother    Hypertension Mother    Depression Father    Hypertension Father    Depression Sister    Breast cancer Sister 5   Hypertension Maternal Grandfather    Depression Maternal Grandmother    Hypertension Maternal Grandmother    Hypertension Paternal Grandfather    Hypertension Paternal Grandmother     Social History:  Social History   Socioeconomic History   Marital status: Married    Spouse name: Not on file   Number of children: Not on file   Years of education: Not on file   Highest education level: Associate degree: occupational, scientist, product/process development, or vocational program  Occupational History   Not on file  Tobacco Use   Smoking status: Never   Smokeless tobacco: Never  Vaping Use   Vaping status: Never Used  Substance and Sexual Activity   Alcohol use: Yes    Alcohol/week: 0.0 standard drinks of alcohol    Comment: RARE   Drug use: No   Sexual activity: Not Currently  Other Topics Concern    Not on file  Social History Narrative   Patient lives at home with husband    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   Tobacco Use: Low Risk (11/22/2024)   Patient History    Smoking Tobacco Use: Never    Smokeless Tobacco Use: Never    Passive Exposure: Not on file  Financial Resource Strain: Low Risk  (08/23/2024)   Received from [facility]   Overall Financial Resource Strain (CARDIA)    Difficulty of Paying Living Expenses: Not  hard at all  Food Insecurity: Low Risk (10/03/2024)   Received from [facility]   Epic    Within the past 12 months, you worried that your food would run out before you got money to buy more: Never true    Within the past 12 months, the food you bought just didn't last and you didn't have money to get more. : Never true  Transportation Needs: No Transportation Needs (10/03/2024)   Received from [facility]    In the past 12 months, has lack of reliable transportation kept you from medical appointments, meetings, work or from getting things needed for daily living? : No  Physical Activity: Unknown (07/04/2023)   Exercise Vital Sign    Days of Exercise per Week: 0 days    Minutes of Exercise per Session: Not on file  Stress: Stress Concern Present (07/04/2023)   [employer] of Occupational Health - Occupational Stress Questionnaire    Feeling of Stress : Very much  Social Connections: Moderately Integrated (07/04/2023)   Social Connection and Isolation Panel    Frequency of Communication with Friends and Family: Three times a week    Frequency of Social Gatherings with Friends and Family: Never    Attends Religious Services: Never    Database Administrator or Organizations: Yes    Attends Banker Meetings: Never    Marital Status: Married  Depression (PHQ2-9): Medium Risk (09/16/2024)   Depression (PHQ2-9)    PHQ-2 Score: 6  Alcohol Screen: Medium Risk (07/04/2023)   Alcohol Screen    Last  Alcohol Screening Score (AUDIT): 9  Housing: Low Risk (10/03/2024)   Received from [facility]   Epic    What is your living situation today?: I have a steady place to live    Think about the place you live. Do you have problems with any of the following? Choose all that apply:: None/None on this list  Utilities: Low Risk (10/03/2024)   Received from [facility]   Utilities    In the past 12 months has the electric, gas, oil, or water  company threatened to shut off services in your home? : No  Health Literacy: Not on file    Allergies: Allergies[1]  Metabolic Disorder Labs: No results found for: HGBA1C, MPG No results found for: PROLACTIN Lab Results  Component Value Date   CHOL 183 12/21/2023   TRIG 47 12/21/2023   HDL 100 12/21/2023   CHOLHDL 1.8 12/21/2023   LDLCALC 73 12/21/2023   LDLCALC 94 02/01/2022   Lab Results  Component Value Date   TSH 3.300 12/21/2023   TSH 2.330 02/01/2022    Therapeutic Level Labs: No results found for: LITHIUM No results found for: VALPROATE No results found for: CBMZ  Current Medications: Current Outpatient Medications  Medication Sig Dispense Refill   Blood Glucose Monitoring Suppl (ONETOUCH VERIO REFLECT) w/Device KIT 1 each by Does not apply route daily at 6 (six) AM. 1 kit 0   busPIRone  (BUSPAR ) 7.5 MG tablet Take 1 tablet (7.5 mg total) by mouth 2 (two) times daily. 180 tablet 1   cetirizine (ZYRTEC) 10 MG tablet Take 10 mg by mouth daily.     Cyanocobalamin  (B-12) 3000 MCG SUBL Place 3,000 mcg under the tongue daily after breakfast. 30 tablet 5   cyclobenzaprine  (FLEXERIL ) 10 MG tablet Take 1 tablet (10 mg total) by mouth 3 (three) times daily as needed for muscle spasms. 30 tablet 0   desvenlafaxine  (PRISTIQ )  100 MG 24 hr tablet Take 1 tablet (100 mg total) by mouth daily. Start after completing 50 mg daily for 7 days (Patient not taking: Reported on 11/04/2024) 30 tablet 0   desvenlafaxine  (PRISTIQ ) 100 MG 24 hr  tablet Take 1 tablet (100 mg total) by mouth daily. 90 tablet 0   famotidine  (PEPCID ) 40 MG tablet Take by mouth. (Patient not taking: Reported on 11/04/2024)     fluticasone  (FLONASE ) 50 MCG/ACT nasal spray Place 2 sprays into both nostrils daily. 16 g 6   furosemide  (LASIX ) 20 MG tablet TAKE 1 TABLET DAILY (Patient taking differently: Take 20 mg by mouth daily as needed (fluid retention.).) 90 tablet 3   glucose blood test strip Use as instructed qid 100 each 12   ibuprofen  (ADVIL ) 800 MG tablet Take 1 tablet (800 mg total) by mouth every 8 (eight) hours as needed. (Patient not taking: Reported on 11/04/2024) 30 tablet 0   irbesartan  (AVAPRO ) 150 MG tablet Take 1 tablet (150 mg total) by mouth daily. 90 tablet 1   L-Methylfolate 15 MG TABS Take 1 tablet (15 mg total) by mouth daily. (Patient not taking: Reported on 11/04/2024) 90 tablet 0   levothyroxine  (SYNTHROID ) 75 MCG tablet Take 1 tablet (75 mcg total) by mouth daily before breakfast. 90 tablet 0   ondansetron  (ZOFRAN ) 8 MG tablet Take 8 mg by mouth every 8 (eight) hours as needed for nausea or vomiting.     ondansetron  (ZOFRAN -ODT) 4 MG disintegrating tablet Take 1 tablet (4 mg total) by mouth every 8 (eight) hours as needed for nausea or vomiting. 20 tablet 0   OneTouch Delica Lancets 30G MISC 1 each by Does not apply route 4 (four) times daily as needed. 100 each 12   propranolol  (INDERAL ) 20 MG tablet Take 1 tablet (20 mg total) by mouth daily as needed. for anxiety 90 tablet 0   rOPINIRole  (REQUIP ) 4 MG tablet Take 1 tablet (4 mg total) by mouth at bedtime. 90 tablet 2   tamsulosin  (FLOMAX ) 0.4 MG CAPS capsule Take 1 capsule (0.4 mg total) by mouth daily as needed. (Patient taking differently: Take 0.4 mg by mouth daily as needed (Kidney stones).) 30 capsule 2   topiramate  (TOPAMAX ) 200 MG tablet Take 1 tablet (200 mg total) by mouth at bedtime. 90 tablet 0   traMADol (ULTRAM) 50 MG tablet Take 25-50 mg by mouth 2 (two) times daily as  needed (back pain.).      traZODone  (DESYREL ) 50 MG tablet Take 0.5-1 tablets (25-50 mg total) by mouth at bedtime. 90 tablet 0   valACYclovir (VALTREX) 1000 MG tablet as needed.     No current facility-administered medications for this visit.     Musculoskeletal: Strength & Muscle Tone: N/A Gait & Station: N/A Patient leans: N/A  Psychiatric Specialty Exam: Review of Systems  Psychiatric/Behavioral:  Positive for dysphoric mood and sleep disturbance. Negative for agitation, behavioral problems, confusion, decreased concentration, hallucinations, self-injury and suicidal ideas. The patient is nervous/anxious. The patient is not hyperactive.   All other systems reviewed and are negative.   There were no vitals taken for this visit.There is no height or weight on file to calculate BMI.  General Appearance: Well Groomed  Eye Contact:  Good  Speech:  Clear and Coherent  Volume:  Normal  Mood:  better  Affect:  Appropriate, Congruent, and Full Range  Thought Process:  Coherent  Orientation:  Full (Time, Place, and Person)  Thought Content: Logical   Suicidal Thoughts:  No  Homicidal Thoughts:  No  Memory:  Immediate;   Good  Judgement:  Good  Insight:  Good  Psychomotor Activity:  Normal  Concentration:  Concentration: Good and Attention Span: Good  Recall:  Good  Fund of Knowledge: Good  Language: Good  Akathisia:  No  Handed:  Right  AIMS (if indicated): not done  Assets:  Communication Skills Desire for Improvement  ADL's:  Intact  Cognition: WNL  Sleep:  Fair   Screenings: AUDIT    Flowsheet Row Appointment from 08/08/2023 in [facility] at [facility] Most recent reading at 07/04/2023  2:07 PM Appointment from 07/05/2023 in [facility] at [facility] Most recent reading at 07/04/2023  2:07 PM  Alcohol Use Disorder Identification Test Final Score (AUDIT) 9  9    GAD-7    Flowsheet Row Office  Visit from 09/16/2024 in [facility] at [facility] Office Visit from 07/09/2024 in [facility] at [facility] Office Visit from 03/27/2024 in [facility] at [facility] Office Visit from 12/21/2023 in [facility] at [facility] Office Visit from 10/24/2023 in [facility]  Total GAD-7 Score 7 21 21 21 21    PHQ2-9    Flowsheet Row Office Visit from 09/16/2024 in [facility] Medicine at [facility] Office Visit from 07/09/2024 in [facility] Care & [facility] Medicine at [facility] Office Visit from 03/27/2024 in [facility] at [facility] Office Visit from 12/21/2023 in [facility] Care & [facility] Medicine at [facility] Office Visit from 10/24/2023 in [facility]  PHQ-2 Total Score 2 6 6 6 6   PHQ-9 Total Score 6 18 22 16 21    Flowsheet Row Office Visit from 06/28/2022 in [facility] Office Visit from 05/26/2022 in 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    1. Major depressive disorder, recurrent episode, moderate (HCC) 2. Anxiety state She experiences ongoing conflict with her mother, who has a medical condition.  She received five surgery after hernia repair surgery related to fistula, and underwent colostomy reversal on February 23. She experienced loss of her sister in November 2022.  She experiences recent stressors, including ongoing communication difficulties with her mother with medical illness, and feelings of being criticized.   History: Tx from Dr. Vincente. Originally on Pristiq  100 mg daily, lamotrigine  150 mg daily, topiramate  200 mg daily, propranolol  20 mg TID. Declined TMS  The exam is notable for bright affect.  There has been consistent improvement in depressive symptoms and anxiety since the medication is cross tapering back to Pristiq .  It is also noted that she has been started on vitamin B12, which she finds highly beneficial for fatigue.  Although she reports interest in  restarting lamotrigine , she feels comfortable to stay on the current medication regimen at this time given her mood continues to be improving.  Will continue current dose of Pristiq  to target depression and anxiety.  She was advised to consider L-methylfolate if she is able to find online as adjunctive treatment for depression.  Will continue BuSpar  for anxiety, and propranolol  as needed for anxiety.  She was informed of possible drowsiness from propranolol  especially given her history of recent concussion.   3. Insomnia, unspecified type Overall improving since being on vitamin B12.  Will continue current dose of trazodone  as needed for insomnia.   4. Binge eating disorder, unspecified severity She reports improvement in GI symptoms since being on the medication.  Will continue current dose of topiramate  to target binge eating.   5. Iron deficiency 6. Encounter for vitamin deficiency screening - ferritin 25 10/2023  She continues to experience restless leg.  Will order lab.  Ferritin, vitamin B12, and vitamin D  testing are medically necessary given her history of bariatric surgery with potential malabsorption and ongoing  depressive symptoms.     Plan  Continue Pristiq  100 mg daily  Continue Buspar  7.5 mg twice a day Continue propranolol  20 mg daily as needed for anxiety- she declined refill Continue topiramate  200 mg daily  Consider starting L-methylfolate 15 mg daily  Continue iron sulfate 325 mg daily Obtain labs (CBC, iron panels, vitamin b12, vitamin D ) at [facility] Continue trazodone  50 mg at night as needed for insomnia  Next appointment-  4/6 at 4 pm. video  - prazosin  2 mg every other night for hypertension - on tramadol, ropinorol - EKG QTc 394 msec on 11/2020   Past trials of medication- sertraline , lexapro, venlafaxine, vilazodone  (rage, worsening in anxiety), bupropion  (palpitation), Abilify, quetiapine  (eating sweets), Vraylar  (good effect, but caused increase in appetite), Rexulti  (good effect, but unable to afford) Geodon  (increase in appetite). Trazodone  (fatigue), Ambien , Lunesta  (severe drowsiness), lemborexant  (limited benefit), ramelteon  (limited benefit)   The patient demonstrates the following risk factors for suicide: Chronic risk factors for suicide include: psychiatric disorder of depression . Acute risk factors for suicide include:  her family suffering from medical illness . Protective factors for this patient include: positive social support, coping skills, and hope for the future. Considering these factors, the overall suicide risk at this point appears to be moderate, but not at imminent risk. Patient is appropriate for outpatient follow up. Emergency resources which includes 911, ED, suicide crisis line (988) are discussed.      Collaboration of Care: Collaboration of Care: Other reviewed notes in Epic  Patient/Guardian was advised Release of Information must be obtained prior to any record release in order to collaborate their care with an outside provider. Patient/Guardian was advised if they have not already done so to contact the registration department to sign all necessary  forms in order for us  to release information regarding their care.   Consent: Patient/Guardian gives verbal consent for treatment and assignment of benefits for services provided during this visit. Patient/Guardian expressed understanding and agreed to proceed.    Toni Sleet, MD 11/22/2024, 12:12 PM     [1]  Allergies Allergen Reactions   Losartan Palpitations    Could feel heartbeat (strongly) in neck   Penicillins Rash   Tape Rash    Skin irritation after back surgery white paper tape

## 2024-11-19 ENCOUNTER — Ambulatory Visit: Admitting: Family Medicine

## 2024-11-20 ENCOUNTER — Encounter: Payer: Self-pay | Admitting: Neurology

## 2024-11-22 ENCOUNTER — Ambulatory Visit: Admitting: Family Medicine

## 2024-11-22 ENCOUNTER — Encounter: Payer: Self-pay | Admitting: Psychiatry

## 2024-11-22 ENCOUNTER — Telehealth: Admitting: Psychiatry

## 2024-11-22 DIAGNOSIS — F411 Generalized anxiety disorder: Secondary | ICD-10-CM

## 2024-11-22 DIAGNOSIS — E611 Iron deficiency: Secondary | ICD-10-CM

## 2024-11-22 DIAGNOSIS — F50819 Binge eating disorder, unspecified: Secondary | ICD-10-CM

## 2024-11-22 DIAGNOSIS — F331 Major depressive disorder, recurrent, moderate: Secondary | ICD-10-CM

## 2024-11-22 DIAGNOSIS — G4700 Insomnia, unspecified: Secondary | ICD-10-CM

## 2024-11-22 DIAGNOSIS — Z1321 Encounter for screening for nutritional disorder: Secondary | ICD-10-CM

## 2024-11-22 NOTE — Patient Instructions (Addendum)
 Continue Pristiq  100 mg daily  Continue Buspar  7.5 mg twice a day Continue propranolol  20 mg daily as needed for anxiety Continue topiramate  200 mg daily  Consider starting L-methylfolate 15 mg daily  Continue iron sulfate 325 mg daily Obtain labs (CBC, iron panels, vitamin b12, vitamin D ) at [facility] Continue trazodone  50 mg at night as needed for insomnia  Next appointment-  4/6 at 4 pm

## 2024-11-26 ENCOUNTER — Other Ambulatory Visit: Admitting: *Deleted

## 2024-11-27 ENCOUNTER — Other Ambulatory Visit

## 2024-11-29 ENCOUNTER — Ambulatory Visit: Admitting: Family Medicine

## 2024-12-30 ENCOUNTER — Ambulatory Visit: Admitting: Student

## 2025-01-08 ENCOUNTER — Encounter: Admitting: Student

## 2025-01-20 ENCOUNTER — Telehealth: Admitting: Psychiatry
